# Patient Record
Sex: Female | Born: 1960 | Race: White | Hispanic: No | Marital: Single | Smoking: Never smoker
Health system: Southern US, Community
[De-identification: ages and names within clinical notes are randomized; demographics above are authoritative.]

## PROBLEM LIST (undated history)

## (undated) DIAGNOSIS — K219 Gastro-esophageal reflux disease without esophagitis: Secondary | ICD-10-CM

## (undated) DIAGNOSIS — D649 Anemia, unspecified: Secondary | ICD-10-CM

## (undated) DIAGNOSIS — F419 Anxiety disorder, unspecified: Secondary | ICD-10-CM

## (undated) DIAGNOSIS — T8859XA Other complications of anesthesia, initial encounter: Secondary | ICD-10-CM

## (undated) DIAGNOSIS — N186 End stage renal disease: Secondary | ICD-10-CM

## (undated) DIAGNOSIS — T4145XA Adverse effect of unspecified anesthetic, initial encounter: Secondary | ICD-10-CM

## (undated) DIAGNOSIS — I10 Essential (primary) hypertension: Secondary | ICD-10-CM

## (undated) DIAGNOSIS — N189 Chronic kidney disease, unspecified: Secondary | ICD-10-CM

## (undated) HISTORY — PX: HYSTEROSCOPY W/ ENDOMETRIAL ABLATION: SUR665

## (undated) HISTORY — PX: BACK SURGERY: SHX140

## (undated) HISTORY — PX: EYE SURGERY: SHX253

---

## 1998-09-05 ENCOUNTER — Emergency Department (HOSPITAL_COMMUNITY): Admission: EM | Admit: 1998-09-05 | Discharge: 1998-09-05 | Payer: Self-pay | Admitting: Emergency Medicine

## 1998-09-05 ENCOUNTER — Encounter: Payer: Self-pay | Admitting: Emergency Medicine

## 2001-07-14 ENCOUNTER — Encounter: Admission: RE | Admit: 2001-07-14 | Discharge: 2001-07-14 | Payer: Self-pay | Admitting: Internal Medicine

## 2001-07-14 ENCOUNTER — Encounter: Payer: Self-pay | Admitting: Internal Medicine

## 2003-05-01 ENCOUNTER — Encounter (HOSPITAL_COMMUNITY): Admission: RE | Admit: 2003-05-01 | Discharge: 2003-07-30 | Payer: Self-pay | Admitting: Internal Medicine

## 2003-05-10 ENCOUNTER — Encounter: Admission: RE | Admit: 2003-05-10 | Discharge: 2003-05-10 | Payer: Self-pay | Admitting: Internal Medicine

## 2003-06-07 ENCOUNTER — Other Ambulatory Visit: Admission: RE | Admit: 2003-06-07 | Discharge: 2003-06-07 | Payer: Self-pay | Admitting: Obstetrics and Gynecology

## 2003-06-14 ENCOUNTER — Encounter: Admission: RE | Admit: 2003-06-14 | Discharge: 2003-06-14 | Payer: Self-pay | Admitting: Obstetrics and Gynecology

## 2004-02-22 ENCOUNTER — Encounter: Admission: RE | Admit: 2004-02-22 | Discharge: 2004-02-22 | Payer: Self-pay | Admitting: Internal Medicine

## 2004-07-02 ENCOUNTER — Other Ambulatory Visit: Admission: RE | Admit: 2004-07-02 | Discharge: 2004-07-02 | Payer: Self-pay | Admitting: Obstetrics and Gynecology

## 2005-08-12 ENCOUNTER — Other Ambulatory Visit: Admission: RE | Admit: 2005-08-12 | Discharge: 2005-08-12 | Payer: Self-pay | Admitting: Obstetrics and Gynecology

## 2005-09-18 ENCOUNTER — Ambulatory Visit (HOSPITAL_COMMUNITY): Admission: RE | Admit: 2005-09-18 | Discharge: 2005-09-18 | Payer: Self-pay | Admitting: Obstetrics and Gynecology

## 2007-05-25 ENCOUNTER — Encounter: Admission: RE | Admit: 2007-05-25 | Discharge: 2007-05-25 | Payer: Self-pay | Admitting: Internal Medicine

## 2007-05-31 ENCOUNTER — Other Ambulatory Visit: Admission: RE | Admit: 2007-05-31 | Discharge: 2007-05-31 | Payer: Self-pay | Admitting: Obstetrics and Gynecology

## 2009-12-09 ENCOUNTER — Other Ambulatory Visit: Admission: RE | Admit: 2009-12-09 | Discharge: 2009-12-09 | Payer: Self-pay | Admitting: Obstetrics and Gynecology

## 2010-05-04 ENCOUNTER — Encounter: Payer: Self-pay | Admitting: Obstetrics and Gynecology

## 2010-08-29 NOTE — H&P (Signed)
[name], [name]           ACCOUNT NO.:  [id number]   MEDICAL RECORD NO.:  [id number]          PATIENT TYPE:  AMB   LOCATION:  [facility]C                           FACILITY:  [facility]   PHYSICIAN:  [name], MDDATE OF BIRTH:  04/15/1960   DATE OF ADMISSION:  DATE OF DISCHARGE:                                HISTORY & PHYSICAL   CHIEF COMPLAINT:  Menorrhagia.   HISTORY:  A 50 year old para 0-0-0-0, LMP Sep 06, 2005, with menorrhagia  with periods very heavy, lasting up to three weeks, failing Micronor and  Depo-Provera, moderate to severe dysmenorrhea and anemia chronically with  negative evaluation with PCP. She was suppressed with birth control pills  successfully, but with multiple medical problems and her age, I feel this is  contraindicated for further use, and she is now to be admitted to undergo an  endometrial ablation. She understands that this would preclude further  childbearing capacity, contraindicate further pregnancy consideration. She  gives informed consent of failed procedure technically as well as medically,  uterine perforation risks, bowel and bladder damage, infection and bleeding.  She gives informed consent. All questions are answered.   PAST MEDICAL HISTORY:  1.  Hypertension.  2.  Diabetes.  3.  Chronic leg edema.   PAST SURGICAL HISTORY:  Back surgery for ruptured disk.   MEDICATIONS:  Dose is not specified. Metformin, glyburide, iron supplements,  atenolol, Lasix and Lantus.   ALLERGIES:  STEROID DOSE PACK; eyes - decreased vision she states reaction.  CODEINE makes her feel loopy. Otherwise, no other medication reactions.   SOCIAL HISTORY:  Denies tobacco, ethanol, drug use or STD exposure. She is  not sexually active. She is not married and has never been sexually active.   FAMILY HISTORY:  Mother with past history of ovarian cancer, survivor at  this time. Father with hypertension. Mother with diabetes. Otherwise no  major medical  issues.   REVIEW OF SYSTEMS:  Menorrhagia is only problem. Denies fevers, chills,  rashes, lesions, headaches, dizziness. Some seasonal allergies are present.  No chest pain, shortness of breath, wheezing, diarrhea, constipation,  bleeding, melena, hematochezia, urgency, frequency, dysuria, incontinence,  hematuria, galactorrhea or emotional changes.   PHYSICAL EXAMINATION:  GENERAL:  Alert and oriented x3 in no distress.  VITAL SIGNS:  Blood pressure 138/82, heart rate 64, respirations 18, weight  167 pounds.  HEENT:  Grossly within normal limits.  NECK:  Supple without thyromegaly or adenopathy.  LUNGS:  Clear bilaterally.  HEART:  Regular rate and rhythm without murmurs, rubs, or gallops.  BREASTS:  Symmetrical. Otherwise deferred.  ABDOMEN:  Mild obesity is present. Otherwise no hepatosplenomegaly or other  masses noted. Abdominal exam is nontender.  PELVIC:  Normal external female genitalia. Bartholin urethral scheme within  normal limits. Vulvar without discharge or lesions. Of note, endometrial  biopsy Aug 19, 2005 was normal, and Pap Aug 12, 2005 showed atypical  endometrial cells felt to possibly indicate menses. Bimanual examination:  Single digit limiting exam, uterus not enlarged on limited exam and also  limited by patient's body habitus. Ovaries are not palpable bilaterally  secondary to patient's  body habitus. Adnexa nontender without masses,  somewhat indeterminate secondary to patient's body habitus.  RECTAL:  Not done. External hemorrhoids are not present. Anus and perineal  body appear normal.   ASSESSMENT:  1.  Menorrhea failing conservative management.  2.  Hypertension.  3.  Diabetes.   PLAN:  1.  Endometrial ablation. Informed consent is given as noted above.  2.  CBG on morning of admission. Withhold diabetes medications from [place]      prior to admission. N.p.o. past midnight. Preoperative CBC and CBG on      arrival. Anesthesia preop. EKG per anesthesia.  All questions were      answered and will proceed as outlined.      Katherine A. Sydnee Cabal, MD  Electronically Signed     CAD/MEDQ  D:  09/16/2005  T:  09/16/2005  Job:  [id number]

## 2010-08-29 NOTE — Op Note (Signed)
[name], [name]           ACCOUNT NO.:  [id number]   MEDICAL RECORD NO.:  [id number]          PATIENT TYPE:  AMB   LOCATION:  SDC                           [facility]ILITY:  [facility]   PHYSICIAN:  Charles A. Delcambre, MDDATE OF BIRTH:  12-03-1960   DATE OF PROCEDURE:  09/18/2005  DATE OF DISCHARGE:                                 OPERATIVE REPORT   PREOPERATIVE DIAGNOSIS:  Menorrhagia.   POSTOPERATIVE DIAGNOSIS:  Menorrhagia.   PROCEDURE:  ThermaChoice endometrial ablation.   SURGEON:  Charles A. Delcambre, MD   ASSISTANT:  None.   COMPLICATIONS:  None.   ESTIMATED BLOOD LOSS:  Minimal.   COMPLICATIONS:  None.   OPERATIVE FINDINGS:  Sounds to 9 cm.   ANESTHESIA:  General endotracheal.   SPONGE COUNT:  Correct x2.   DESCRIPTION OF PROCEDURE:  Patient was taken to the operating room, placed  in a supine position.  She was given a general anesthetic without  difficulty.  Anesthesia was adequate.  She was then placed in the dorsal  lithotomy position in general stirrups and sterile prep and drape was  undertaken.  A speculum was placed.  A single-tooth tenaculum was placed on  her cervix.  The sound was to 9 cm.  Protocol was undertaken for  ThermaChoice.  Prep was taken to -160.  Pressurization after insertion was  then carried out to 180.  Pressure stabilized between 160-180.  Heating  cycle was begun.  A full heating cycle at 87 degrees Centigrade for eight  minutes was accomplished without difficulty with stable pressure.  Instrument removed.  Tenaculum was removed.  Hemostasis was adequate.  The  patient tolerated the procedure well, was awakened, extubated, and taken to  the recovery with physician in attendance.      Charles A. Sydnee Cabal, MD  Electronically Signed    CAD/MEDQ  D:  09/18/2005  T:  09/18/2005  Job:  [id number]

## 2011-09-06 ENCOUNTER — Encounter (HOSPITAL_COMMUNITY): Payer: Self-pay | Admitting: *Deleted

## 2011-09-06 ENCOUNTER — Emergency Department (HOSPITAL_COMMUNITY): Payer: Self-pay

## 2011-09-06 ENCOUNTER — Inpatient Hospital Stay (HOSPITAL_COMMUNITY)
Admission: EM | Admit: 2011-09-06 | Discharge: 2011-09-08 | DRG: 392 | Disposition: A | Discharge status: Home / Self Care | Payer: Self-pay | Source: Ambulatory Visit | Attending: Internal Medicine | Admitting: Internal Medicine

## 2011-09-06 DIAGNOSIS — D509 Iron deficiency anemia, unspecified: Secondary | ICD-10-CM | POA: Diagnosis present

## 2011-09-06 DIAGNOSIS — E119 Type 2 diabetes mellitus without complications: Secondary | ICD-10-CM | POA: Diagnosis present

## 2011-09-06 DIAGNOSIS — E871 Hypo-osmolality and hyponatremia: Secondary | ICD-10-CM

## 2011-09-06 DIAGNOSIS — I129 Hypertensive chronic kidney disease with stage 1 through stage 4 chronic kidney disease, or unspecified chronic kidney disease: Secondary | ICD-10-CM | POA: Diagnosis present

## 2011-09-06 DIAGNOSIS — N183 Chronic kidney disease, stage 3 unspecified: Secondary | ICD-10-CM | POA: Diagnosis present

## 2011-09-06 DIAGNOSIS — K2900 Acute gastritis without bleeding: Secondary | ICD-10-CM

## 2011-09-06 DIAGNOSIS — Z91199 Patient's noncompliance with other medical treatment and regimen due to unspecified reason: Secondary | ICD-10-CM

## 2011-09-06 DIAGNOSIS — R109 Unspecified abdominal pain: Secondary | ICD-10-CM

## 2011-09-06 DIAGNOSIS — Z9119 Patient's noncompliance with other medical treatment and regimen: Secondary | ICD-10-CM

## 2011-09-06 DIAGNOSIS — IMO0001 Reserved for inherently not codable concepts without codable children: Secondary | ICD-10-CM | POA: Diagnosis present

## 2011-09-06 DIAGNOSIS — R1013 Epigastric pain: Secondary | ICD-10-CM | POA: Diagnosis present

## 2011-09-06 DIAGNOSIS — N179 Acute kidney failure, unspecified: Secondary | ICD-10-CM | POA: Diagnosis present

## 2011-09-06 DIAGNOSIS — E875 Hyperkalemia: Secondary | ICD-10-CM | POA: Diagnosis present

## 2011-09-06 DIAGNOSIS — K2970 Gastritis, unspecified, without bleeding: Secondary | ICD-10-CM | POA: Diagnosis present

## 2011-09-06 DIAGNOSIS — R739 Hyperglycemia, unspecified: Secondary | ICD-10-CM | POA: Diagnosis present

## 2011-09-06 HISTORY — DX: Anemia, unspecified: D64.9

## 2011-09-06 HISTORY — DX: Essential (primary) hypertension: I10

## 2011-09-06 LAB — CBC
Hemoglobin: 12.1 g/dL (ref 12.0–15.0)
MCH: 26.5 pg (ref 26.0–34.0)
MCV: 77.9 fL — ABNORMAL LOW (ref 78.0–100.0)
RBC: 4.57 MIL/uL (ref 3.87–5.11)

## 2011-09-06 LAB — COMPREHENSIVE METABOLIC PANEL
Alkaline Phosphatase: 179 U/L — ABNORMAL HIGH (ref 39–117)
BUN: 52 mg/dL — ABNORMAL HIGH (ref 6–23)
Calcium: 9.8 mg/dL (ref 8.4–10.5)
Creatinine, Ser: 2.38 mg/dL — ABNORMAL HIGH (ref 0.50–1.10)
GFR calc Af Amer: 26 mL/min — ABNORMAL LOW (ref >90)
Glucose, Bld: 346 mg/dL — ABNORMAL HIGH (ref 70–99)
Total Protein: 7.3 g/dL (ref 6.0–8.3)

## 2011-09-06 LAB — POCT I-STAT TROPONIN I: Troponin i, poc: 0 ng/mL (ref 0.00–0.08)

## 2011-09-06 LAB — DIFFERENTIAL
Eosinophils Absolute: 0.1 10*3/uL (ref 0.0–0.7)
Eosinophils Relative: 1 % (ref 0–5)
Lymphs Abs: 1.7 10*3/uL (ref 0.7–4.0)
Monocytes Absolute: 0.5 10*3/uL (ref 0.1–1.0)
Monocytes Relative: 6 % (ref 3–12)

## 2011-09-06 LAB — BASIC METABOLIC PANEL
BUN: 50 mg/dL — ABNORMAL HIGH (ref 6–23)
Creatinine, Ser: 2.35 mg/dL — ABNORMAL HIGH (ref 0.50–1.10)
GFR calc Af Amer: 26 mL/min — ABNORMAL LOW (ref >90)
GFR calc non Af Amer: 23 mL/min — ABNORMAL LOW (ref >90)
Potassium: 5.6 mEq/L — ABNORMAL HIGH (ref 3.5–5.1)

## 2011-09-06 LAB — GLUCOSE, CAPILLARY
Glucose-Capillary: 344 mg/dL — ABNORMAL HIGH (ref 70–99)
Glucose-Capillary: 397 mg/dL — ABNORMAL HIGH (ref 70–99)

## 2011-09-06 LAB — LIPASE, BLOOD: Lipase: 86 U/L — ABNORMAL HIGH (ref 11–59)

## 2011-09-06 MED ORDER — HYDROMORPHONE HCL PF 1 MG/ML IJ SOLN
1.0000 mg | Freq: Once | INTRAMUSCULAR | Status: AC
  Administered 2011-09-06: 1 mg via INTRAVENOUS
  Filled 2011-09-06: qty 1

## 2011-09-06 MED ORDER — ONDANSETRON HCL 4 MG PO TABS: 4.0000 mg | ORAL_TABLET | ORAL | Status: DC | PRN

## 2011-09-06 MED ORDER — PANTOPRAZOLE SODIUM 40 MG IV SOLR
40.0000 mg | INTRAVENOUS | Status: DC
  Administered 2011-09-06: 40 mg via INTRAVENOUS
  Filled 2011-09-06: qty 40

## 2011-09-06 MED ORDER — INSULIN ASPART 100 UNIT/ML ~~LOC~~ SOLN
0.0000 [IU] | Freq: Every day | SUBCUTANEOUS | Status: DC
  Administered 2011-09-06: 5 [IU] via SUBCUTANEOUS

## 2011-09-06 MED ORDER — ONDANSETRON HCL 4 MG/2ML IJ SOLN
4.0000 mg | Freq: Once | INTRAMUSCULAR | Status: AC
  Administered 2011-09-06: 4 mg via INTRAVENOUS

## 2011-09-06 MED ORDER — INSULIN ASPART 100 UNIT/ML ~~LOC~~ SOLN
0.0000 [IU] | Freq: Three times a day (TID) | SUBCUTANEOUS | Status: DC
  Administered 2011-09-07: 8 [IU] via SUBCUTANEOUS
  Administered 2011-09-07: 3 [IU] via SUBCUTANEOUS
  Administered 2011-09-08: 5 [IU] via SUBCUTANEOUS

## 2011-09-06 MED ORDER — SODIUM CHLORIDE 0.9 % IV SOLN
INTRAVENOUS | Status: DC
  Administered 2011-09-06 – 2011-09-07 (×3): via INTRAVENOUS

## 2011-09-06 MED ORDER — FELODIPINE ER 5 MG PO TB24
5.0000 mg | ORAL_TABLET | Freq: Every day | ORAL | Status: DC
  Administered 2011-09-06 – 2011-09-07 (×2): 5 mg via ORAL
  Filled 2011-09-06 (×3): qty 1

## 2011-09-06 MED ORDER — SODIUM CHLORIDE 0.9 % IV SOLN: INTRAVENOUS | Status: AC

## 2011-09-06 MED ORDER — ONDANSETRON HCL 4 MG/2ML IJ SOLN
4.0000 mg | Freq: Once | INTRAMUSCULAR | Status: AC
  Administered 2011-09-06: 4 mg via INTRAVENOUS
  Filled 2011-09-06: qty 2

## 2011-09-06 MED ORDER — HYDROMORPHONE HCL PF 1 MG/ML IJ SOLN: 1.0000 mg | INTRAMUSCULAR | Status: AC | PRN

## 2011-09-06 MED ORDER — HEPARIN SODIUM (PORCINE) 5000 UNIT/ML IJ SOLN
5000.0000 [IU] | Freq: Three times a day (TID) | INTRAMUSCULAR | Status: DC
  Administered 2011-09-06 – 2011-09-08 (×5): 5000 [IU] via SUBCUTANEOUS
  Filled 2011-09-06 (×8): qty 1

## 2011-09-06 MED ORDER — ONDANSETRON HCL 4 MG/2ML IJ SOLN: 4.0000 mg | Freq: Three times a day (TID) | INTRAMUSCULAR | Status: DC | PRN

## 2011-09-06 MED ORDER — SODIUM CHLORIDE 0.9 % IV SOLN
Freq: Once | INTRAVENOUS | Status: AC
  Administered 2011-09-06: 09:00:00 via INTRAVENOUS

## 2011-09-06 MED ORDER — ATENOLOL 50 MG PO TABS
50.0000 mg | ORAL_TABLET | Freq: Two times a day (BID) | ORAL | Status: DC
  Administered 2011-09-06 – 2011-09-08 (×4): 50 mg via ORAL
  Filled 2011-09-06 (×5): qty 1

## 2011-09-06 MED ORDER — ONDANSETRON HCL 4 MG/2ML IJ SOLN
INTRAMUSCULAR | Status: AC
  Filled 2011-09-06: qty 2

## 2011-09-06 MED ORDER — INSULIN ASPART 100 UNIT/ML ~~LOC~~ SOLN: 0.0000 [IU] | Freq: Three times a day (TID) | SUBCUTANEOUS | Status: DC

## 2011-09-06 MED ORDER — INSULIN ASPART 100 UNIT/ML ~~LOC~~ SOLN: 0.0000 [IU] | Freq: Every day | SUBCUTANEOUS | Status: DC

## 2011-09-06 MED ORDER — ONDANSETRON HCL 4 MG/2ML IJ SOLN
4.0000 mg | Freq: Four times a day (QID) | INTRAMUSCULAR | Status: DC | PRN
  Administered 2011-09-06: 4 mg via INTRAVENOUS
  Filled 2011-09-06: qty 2

## 2011-09-06 MED ORDER — ALUM & MAG HYDROXIDE-SIMETH 200-200-20 MG/5ML PO SUSP: 30.0000 mL | ORAL | Status: DC | PRN

## 2011-09-06 MED ORDER — GLUCAGON HCL (RDNA) 1 MG IJ SOLR
1.0000 mg | Freq: Once | INTRAMUSCULAR | Status: AC
  Administered 2011-09-06: 1 mg via INTRAVENOUS
  Filled 2011-09-06: qty 1

## 2011-09-06 MED ORDER — PANTOPRAZOLE SODIUM 40 MG IV SOLR
40.0000 mg | Freq: Two times a day (BID) | INTRAVENOUS | Status: DC
  Administered 2011-09-06 – 2011-09-07 (×3): 40 mg via INTRAVENOUS
  Filled 2011-09-06 (×5): qty 40

## 2011-09-06 NOTE — ED Notes (Signed)
Pt actively vomiting at the time. Also states epigastric pain has returned after ultrasound. Rating 6/10 at the time. Medicated, will continue to monitor.

## 2011-09-06 NOTE — ED Notes (Signed)
Pt reports having epigastric/chest pain that started yesterday after eating at a cookout, radiates into her back. ekg done at triage, no acute distress noted.

## 2011-09-06 NOTE — ED Notes (Signed)
Pt returned to exam room from ultrasound.  

## 2011-09-06 NOTE — ED Notes (Signed)
Pt transported to ultrasound.

## 2011-09-06 NOTE — H&P (Signed)
PCP:  Pearla Dubonnet, MD  DOA:  09/06/2011  7:59 AM  Chief Complaint:  Epigastric pain with nausea since 1 day  HPI: 51 year old female with history off hypertension and diabetes [on oral hypoglycemic agent], ? Chronic kidney disease, iron deficiency anemia presented to the ED with severe epigastric pain associated with nausea since 1 day. Patient informs going to cookout yesterday her she had some pork and after returning home started having severe epigastric pain which was sharp and stabbing in nature associated with bloating and severe nausea. She took some Mylanta home without much relief. She felt as if a lump was stuck in her upper abdomen and tried to vomited out but could not. This morning the symptoms got severe with pain radiating into the shoulder blades. She also felt bloated and  was experiencing some difficulty catching breath with it and came to ED. Patient had a lab work done in the ED which showed mild hyponatremia and hyperkalemia along with underlying renal disease and elevated blood glucose level. She had a barium swallow which was unremarkable for any obstruction and an ultrasound of the abdomen showed no signs of gallbladder obstruction. She had an episode of bilious vomiting after returning from the ultrasound. On my evaluation she still having off and on nausea.  She denies any chest pain or palpitation. Denies history of GERD. Denies any fever, urinary symptoms or diarrhea. Denies any hematemesis. Informs that she has general upset with me products and usually avoids it.   Allergies: Allergies  Allergen Reactions  . Codeine Other (See Comments)    hallucinations    Prior to Admission medications   Medication Sig Start Date End Date Taking? Authorizing Provider  aluminum & magnesium hydroxide (MAALOX) 225-200 MG/5ML suspension Take 30 mLs by mouth every 6 (six) hours as needed. For bloating   Yes Historical Provider, MD  atenolol (TENORMIN) 50 MG tablet Take 50 mg  by mouth 2 (two) times daily.   Yes Historical Provider, MD  famotidine (PEPCID AC) 10 MG chewable tablet Chew 10-20 mg by mouth 2 (two) times daily as needed. For nausea or queasiness   Yes Historical Provider, MD  felodipine (PLENDIL) 5 MG 24 hr tablet Take 5 mg by mouth at bedtime.   Yes Historical Provider, MD  ferrous gluconate (FERGON) 325 MG tablet Take 325-650 mg by mouth 2 (two) times daily. 2 tablets in the morning and 1 tablet at night   Yes Historical Provider, MD  glyBURIDE (DIABETA) 5 MG tablet Take 10 mg by mouth 2 (two) times daily with a meal.   Yes Historical Provider, MD  ibuprofen (ADVIL,MOTRIN) 200 MG tablet Take 200-800 mg by mouth every 6 (six) hours as needed. For pain   Yes Historical Provider, MD  insulin glargine (LANTUS) 100 UNIT/ML injection Inject 10-15 Units into the skin at bedtime as needed. If blood sugars up to 170-180   Yes Historical Provider, MD  metFORMIN (GLUCOPHAGE) 500 MG tablet Take 1,000 mg by mouth 2 (two) times daily with a meal.   Yes Historical Provider, MD  neomycin-bacitracin-polymyxin (NEOSPORIN) OINT Apply 1 application topically as needed. For yellow jacket sting   Yes Historical Provider, MD    Past Medical History  Diagnosis Date  . Hypertension   . Diabetes mellitus     History reviewed. No pertinent past surgical history.  Social History:  reports that she has never smoked. She does not have any smokeless tobacco history on file. She reports that she does not drink  alcohol or use illicit drugs.  History reviewed. No pertinent family history.  Review of Systems:  Constitutional: Denies fever, chills, diaphoresis, appetite change and fatigue.  HEENT: Denies photophobia, eye pain, redness, hearing loss, ear pain, congestion, sore throat, rhinorrhea, sneezing, mouth sores, trouble swallowing, neck pain, neck stiffness and tinnitus.   Respiratory: Denies SOB, DOE, cough, chest tightness,  and wheezing.   Cardiovascular: Denies chest  pain, palpitations and leg swelling.  Gastrointestinal: nausea, vomiting, abdominal pain and  distention. , Denies diarrhea, constipation, blood in stool.  Genitourinary: Denies dysuria, urgency, frequency, hematuria, flank pain and difficulty urinating.  Musculoskeletal: Denies myalgias, back pain, joint swelling, arthralgias and gait problem.  Skin: Denies pallor, rash and wound.  Neurological: Denies dizziness, seizures, syncope, weakness, light-headedness, numbness and headaches.  Hematological: Denies adenopathy. Easy bruising, personal or family bleeding history  Psychiatric/Behavioral: Denies suicidal ideation, mood changes, confusion, nervousness, sleep disturbance and agitation   Physical Exam:  Filed Vitals:   09/06/11 1215 09/06/11 1410 09/06/11 1547 09/06/11 1712  BP: 189/93 180/80 160/87 164/58  Pulse: 70 64 67 63  Temp:  98.8 F (37.1 C) 98.6 F (37 C) 98.7 F (37.1 C)  TempSrc:  Oral Oral Oral  Resp: 20 18 18 18   SpO2: 97% 99% 99% 100%    Constitutional: Vital signs reviewed.  Patient is a well-developed and well-nourished in no acute distress and cooperative with exam. Alert and oriented x3.  Head: Normocephalic and atraumatic Ear: TM normal bilaterally Mouth: no erythema or exudates, MMM Eyes: PERRL, EOMI, conjunctivae normal, No scleral icterus.  Neck: Supple, Trachea midline normal ROM, No JVD, mass, thyromegaly, or carotid bruit present.  Cardiovascular: RRR, S1 normal, S2 normal, no MRG, pulses symmetric and intact bilaterally Pulmonary/Chest: CTAB, no wheezes, rales, or rhonchi Abdominal: Soft. BS+ . Tender on deep palpation over the epigastric area. Unable to elicit murphy's sign.  non-distended, bowel sounds are normal, no masses, organomegaly, or guarding present.  GU: no CVA tenderness Musculoskeletal: No joint deformities, erythema, or stiffness, ROM full and no nontender Ext: no edema and no cyanosis, pulses palpable bilaterally (DP and PT) Hematology:  no cervical, inginal, or axillary adenopathy.  Neurological: A&O x3, Strenght is normal and symmetric bilaterally, cranial nerve II-XII are grossly intact, no focal motor deficit, sensory intact to light touch bilaterally.  Skin: Warm, dry and intact. No rash, cyanosis, or clubbing.  Psychiatric: Normal mood and affect. speech and behavior is normal. Judgment and thought content normal. Cognition and memory are normal.   Labs on Admission:  Results for orders placed during the hospital encounter of 09/06/11 (from the past 48 hour(s))  CBC     Status: Abnormal   Collection Time   09/06/11  8:49 AM      Component Value Range Comment   WBC 8.9  4.0 - 10.5 (K/uL)    RBC 4.57  3.87 - 5.11 (MIL/uL)    Hemoglobin 12.1  12.0 - 15.0 (g/dL)    HCT 16.1 (*) 09.6 - 46.0 (%)    MCV 77.9 (*) 78.0 - 100.0 (fL)    MCH 26.5  26.0 - 34.0 (pg)    MCHC 34.0  30.0 - 36.0 (g/dL)    RDW 04.5  40.9 - 81.1 (%)    Platelets 271  150 - 400 (K/uL)   DIFFERENTIAL     Status: Normal   Collection Time   09/06/11  8:49 AM      Component Value Range Comment   Neutrophils Relative 74  43 -  77 (%)    Neutro Abs 6.5  1.7 - 7.7 (K/uL)    Lymphocytes Relative 19  12 - 46 (%)    Lymphs Abs 1.7  0.7 - 4.0 (K/uL)    Monocytes Relative 6  3 - 12 (%)    Monocytes Absolute 0.5  0.1 - 1.0 (K/uL)    Eosinophils Relative 1  0 - 5 (%)    Eosinophils Absolute 0.1  0.0 - 0.7 (K/uL)    Basophils Relative 0  0 - 1 (%)    Basophils Absolute 0.0  0.0 - 0.1 (K/uL)   COMPREHENSIVE METABOLIC PANEL     Status: Abnormal   Collection Time   09/06/11  8:49 AM      Component Value Range Comment   Sodium 129 (*) 135 - 145 (mEq/L)    Potassium 5.3 (*) 3.5 - 5.1 (mEq/L)    Chloride 97  96 - 112 (mEq/L)    CO2 19  19 - 32 (mEq/L)    Glucose, Bld 346 (*) 70 - 99 (mg/dL)    BUN 52 (*) 6 - 23 (mg/dL)    Creatinine, Ser 1.61 (*) 0.50 - 1.10 (mg/dL)    Calcium 9.8  8.4 - 10.5 (mg/dL)    Total Protein 7.3  6.0 - 8.3 (g/dL)    Albumin 3.3 (*)  3.5 - 5.2 (g/dL)    AST 12  0 - 37 (U/L)    ALT 10  0 - 35 (U/L)    Alkaline Phosphatase 179 (*) 39 - 117 (U/L)    Total Bilirubin 0.2 (*) 0.3 - 1.2 (mg/dL)    GFR calc non Af Amer 22 (*) >90 (mL/min)    GFR calc Af Amer 26 (*) >90 (mL/min)   LIPASE, BLOOD     Status: Abnormal   Collection Time   09/06/11  8:49 AM      Component Value Range Comment   Lipase 86 (*) 11 - 59 (U/L)   POCT I-STAT TROPONIN I     Status: Normal   Collection Time   09/06/11  9:04 AM      Component Value Range Comment   Troponin i, poc 0.00  0.00 - 0.08 (ng/mL)    Comment 3              Radiological Exams on Admission: Gallbladder: Gallbladder wall is 2.6 mm, within normal limits. No  stones or pericholecystic fluid. No sonographic Murphy's sign. A  probable small polyp is identified within the gallbladder,  measuring 4 mm in diameter. No stones are present.  Common bile duct: 4.3 mm.  Liver: No focal lesion identified. Within normal limits in  parenchymal echogenicity.  IVC: Appears normal.  Pancreas: The pancreatic tail is not well seen because of bowel  gas. Visualized portion of the pancreas has a normal appearance.  Spleen: The spleen is normal appearance, 4.7 cm.  Right Kidney: Echogenic parenchyma, 8.9 cm in appearance. No  focal mass or hydronephrosis.  Left Kidney: Echogenic parenchyma, 9.8 cm in appearance. No focal  mass or hydronephrosis.  Abdominal aorta: No aneurysm, 2.0 cm.  IMPRESSION:  1. Probable small gallbladder polyp.  2. No evidence for acute cholecystitis.  3. Echogenic renal parenchyma bilaterally, suggesting intrinsic  renal disease. No hydronephrosis identified.   EKG: NSR@76  isolated TWI in avL , no ST-T changes  Assessment/Plan   *Acute Gastritis   admit to medical floor on observation We'll start on IV fluids with normal saline IV Protonix 40 mg twice  a day When necessary Maalox, when necessary Zofran for nausea Patient does take Motrin for pain as needed which  will be held. Denies Taking any extra pills  i will start her on clear liquids Recheck lytes    Hyponatremia Related to vomiting and some dehydration Continue with IV hydration and recheck   Acute kidney injury She informs that she does have some underlying kidney disease although does not remember what last creatinine was. Check a UA She could have some underlying prerenal acute kidney injury Ultrasound abdomen suggestive of intrinsic renal disease Continue fluids and Recheck labs   Diabetes mellitus Patient noted to have elevated blood glucose in the ED Hold Oral hypoglycemics Check FSG and sliding scale insulin informs Having her A1c checked recently  Hypertension Continue home medications   Hyperkalemia recheck potassium level. Hold off From giving Kayexalate at this time  Diet Clear liquids  DVT prophylaxis SQ Heparin  CODE STATUS full code  Patient will be  taken over by [facility] service in the morning  Time Spent on Admission: 45 minutes  Tessy Pawelski 09/06/2011, 5:20 PM

## 2011-09-06 NOTE — ED Notes (Signed)
Pt presents to department for evaluation of epigastric/chest pain. States she ate several hot dogs yesterday afternoon. Pain started after. States she feels like "something is stuck." points to upper epigastric area and middle of chest when asked about pain, rating 5/10 at the time. Pain radiates between both shoulder blades. Also states it's difficult for her to take a deep breath. Respirations labored. 100% on RA. Skin warm and dry. She is conscious alert and oriented x4. Denies previous cardiac history. Also states nausea.

## 2011-09-06 NOTE — ED Notes (Signed)
Pt updated on plan of care. Denies pain. Vital signs stable. To have abdominal ultrasound. Pt resting quietly at the time.

## 2011-09-06 NOTE — ED Notes (Addendum)
Pt placed on monitor, cont. Pulse ox. Family at bedside.

## 2011-09-06 NOTE — ED Provider Notes (Cosign Needed)
History     CSN: [id number]  Arrival date & time 09/06/11  7829   First MD Initiated Contact with Patient 09/06/11 0825      Chief Complaint  Patient presents with  . Chest Pain    (Consider location/radiation/quality/duration/timing/severity/associated sxs/prior treatment) HPI Comments: And 51 year old woman who went to a cookout yesterday T8 to hot after she fell today and was at the food gets stuck in her upper abdomen. The food didn't seem to go down. She has been able to take liquids, but can't eat food. She feels a feeling in her upper back like when she had pneumonia in the past. She is not coughing. She tried to vomit, but nothing really came up. He therefore sought evaluation. There is no prior history of similar episode. Past history is noteworthy in that she has diabetes and hypertension.  Patient is a 51 y.o. female presenting with GI illlness.  GI Problem  This is a new problem. The current episode started 12 to 24 hours ago. Episode frequency: There is a constant feeling of something stuck in her lower chest upper abdomen. The problem has not changed since onset.Diarrhea characteristics: No diarrhea. There has been no fever. Pertinent negatives include no chills. She has tried nothing for the symptoms.    Past Medical History  Diagnosis Date  . Hypertension   . Diabetes mellitus     History reviewed. No pertinent past surgical history.  History reviewed. No pertinent family history.  History  Substance Use Topics  . Smoking status: Never Smoker   . Smokeless tobacco: Not on file  . Alcohol Use: No    OB History    Grav Para Term Preterm Abortions TAB SAB Ect Mult Living                  Review of Systems  Constitutional: Negative.  Negative for fever and chills.  HENT: Negative.   Eyes: Negative.   Respiratory: Negative.   Cardiovascular: Negative.   Gastrointestinal:       Feels food is stuck in her lower chest/upper abdomen.  Musculoskeletal:  Negative.   Skin: Negative.   Neurological: Negative.   Psychiatric/Behavioral: Negative.     Allergies  Review of patient's allergies indicates no known allergies.  Home Medications  No current outpatient prescriptions on file.  BP 220/80  Pulse 74  Temp(Src) 98.4 F (36.9 C) (Oral)  Resp 20  SpO2 100%  Physical Exam  Constitutional: She is oriented to person, place, and time. She appears well-developed and well-nourished. Distressed: moderate distress, with foreign body sensation in her lower chest/upper abdomen.  HENT:  Head: Normocephalic and atraumatic.  Right Ear: External ear normal.  Left Ear: External ear normal.  Mouth/Throat: Oropharynx is clear and moist.  Eyes: Conjunctivae are normal. Pupils are equal, round, and reactive to light.  Neck: Normal range of motion. Neck supple.  Cardiovascular: Normal rate, regular rhythm and normal heart sounds.   Pulmonary/Chest: Effort normal and breath sounds normal.  Abdominal: Soft. Bowel sounds are normal.  Musculoskeletal: Normal range of motion.  Neurological: She is alert and oriented to person, place, and time.       No sensory or motor deficit.  Skin: Skin is warm and dry.  Psychiatric: She has a normal mood and affect. Her behavior is normal.    ED Course  Procedures (including critical care time)  8:26 AM  Date: 09/06/2011  Rate: 76  Rhythm: normal sinus rhythm  QRS Axis: left  Intervals:  normal  ST/T Wave abnormalities: normal  Conduction Disutrbances:none  Narrative Interpretation: Normal EKG  Old EKG Reviewed: unchanged  8:55 AM Patient was seen and had physical exam. Laboratory testing was ordered. IV glucagon was ordered. EKG was benign.  9:08 AM Pt is now vomiting.  Rx IV Zofran 4 mg.  10:33 AM Results for orders placed during the hospital encounter of 09/06/11  CBC      Component Value Range   WBC 8.9  4.0 - 10.5 (K/uL)   RBC 4.57  3.87 - 5.11 (MIL/uL)   Hemoglobin 12.1  12.0 - 15.0  (g/dL)   HCT 46.9 (*) 62.9 - 46.0 (%)   MCV 77.9 (*) 78.0 - 100.0 (fL)   MCH 26.5  26.0 - 34.0 (pg)   MCHC 34.0  30.0 - 36.0 (g/dL)   RDW 52.8  41.3 - 24.4 (%)   Platelets 271  150 - 400 (K/uL)  DIFFERENTIAL      Component Value Range   Neutrophils Relative 74  43 - 77 (%)   Neutro Abs 6.5  1.7 - 7.7 (K/uL)   Lymphocytes Relative 19  12 - 46 (%)   Lymphs Abs 1.7  0.7 - 4.0 (K/uL)   Monocytes Relative 6  3 - 12 (%)   Monocytes Absolute 0.5  0.1 - 1.0 (K/uL)   Eosinophils Relative 1  0 - 5 (%)   Eosinophils Absolute 0.1  0.0 - 0.7 (K/uL)   Basophils Relative 0  0 - 1 (%)   Basophils Absolute 0.0  0.0 - 0.1 (K/uL)  COMPREHENSIVE METABOLIC PANEL      Component Value Range   Sodium 129 (*) 135 - 145 (mEq/L)   Potassium 5.3 (*) 3.5 - 5.1 (mEq/L)   Chloride 97  96 - 112 (mEq/L)   CO2 19  19 - 32 (mEq/L)   Glucose, Bld 346 (*) 70 - 99 (mg/dL)   BUN 52 (*) 6 - 23 (mg/dL)   Creatinine, Ser 0.10 (*) 0.50 - 1.10 (mg/dL)   Calcium 9.8  8.4 - 27.2 (mg/dL)   Total Protein 7.3  6.0 - 8.3 (g/dL)   Albumin 3.3 (*) 3.5 - 5.2 (g/dL)   AST 12  0 - 37 (U/L)   ALT 10  0 - 35 (U/L)   Alkaline Phosphatase 179 (*) 39 - 117 (U/L)   Total Bilirubin 0.2 (*) 0.3 - 1.2 (mg/dL)   GFR calc non Af Amer 22 (*) >90 (mL/min)   GFR calc Af Amer 26 (*) >90 (mL/min)  LIPASE, BLOOD      Component Value Range   Lipase 86 (*) 11 - 59 (U/L)  POCT I-STAT TROPONIN I      Component Value Range   Troponin i, poc 0.00  0.00 - 0.08 (ng/mL)   Comment 3            No results found.  Lab tests showed elevated blood glucose, renal insufficiency.  No relief with glucagon. Will request barium swallow.  Rx of her pain with IV Dilaudid.    1:41 PM Barium swallow was negative.  Will get abdominal ultrasound to check gall bladder. Her pain is relieved at present.  3:55 PM Abdominal ultrasound showed no gallstones.  She continues with abdominal pain and is vomiting up yellow bile.  Will ask [facility] to admit her  for Dr. Johnella Moloney.  Case discussed with Dr. Gonzella Lex.  Admit to an observation bed to  [facility] 4 to a med surg bed  1. Abdominal  pain, other specified site   2. Nausea and vomiting            Carleene Cooper III, MD 09/06/11 1630

## 2011-09-06 NOTE — ED Notes (Signed)
Rylynne Schicker [phone] (home) [phone] (cell)

## 2011-09-07 LAB — URINE MICROSCOPIC-ADD ON

## 2011-09-07 LAB — URINALYSIS, ROUTINE W REFLEX MICROSCOPIC
Bilirubin Urine: NEGATIVE
Glucose, UA: >1000 mg/dL — AB
Protein, ur: >300 mg/dL — AB

## 2011-09-07 LAB — COMPREHENSIVE METABOLIC PANEL
ALT: 8 U/L (ref 0–35)
AST: 11 U/L (ref 0–37)
CO2: 20 mEq/L (ref 19–32)
Chloride: 103 mEq/L (ref 96–112)
Creatinine, Ser: 2.4 mg/dL — ABNORMAL HIGH (ref 0.50–1.10)
GFR calc Af Amer: 26 mL/min — ABNORMAL LOW (ref >90)
GFR calc non Af Amer: 22 mL/min — ABNORMAL LOW (ref >90)
Glucose, Bld: 99 mg/dL (ref 70–99)
Total Bilirubin: 0.2 mg/dL — ABNORMAL LOW (ref 0.3–1.2)

## 2011-09-07 MED ORDER — ACETAMINOPHEN 325 MG PO TABS
650.0000 mg | ORAL_TABLET | Freq: Four times a day (QID) | ORAL | Status: DC | PRN
  Administered 2011-09-07 – 2011-09-08 (×3): 650 mg via ORAL
  Filled 2011-09-07 (×3): qty 2

## 2011-09-07 MED ORDER — INSULIN ASPART 100 UNIT/ML ~~LOC~~ SOLN
4.0000 [IU] | Freq: Once | SUBCUTANEOUS | Status: AC
  Administered 2011-09-07: 4 [IU] via SUBCUTANEOUS

## 2011-09-07 MED ORDER — TRAMADOL HCL 50 MG PO TABS
50.0000 mg | ORAL_TABLET | Freq: Once | ORAL | Status: AC
  Administered 2011-09-07: 50 mg via ORAL
  Filled 2011-09-07: qty 1

## 2011-09-07 NOTE — Progress Notes (Signed)
Subjective: Admission H&P reviewed, patient presented with nausea, in the ED found to have renal insufficiency uncontrolled diabetes. She was admitted with IV fluids and oral hypoglycemics were held, sliding scale insulin has been started . Patient does not recall any events of nausea vomiting and abdominal pain on a regular basis, denies any history of gastroparesis. She has been taking some NSAIDs. She does endorse being noncompliant with her medication and also suffered from some depression. In regards to the episode from yesterday She just felt like food is stuck in her throat.  Objective: Vital signs in last 24 hours: Temp:  [97.5 F (36.4 C)-98.8 F (37.1 C)] 97.9 F (36.6 C) (05/27 0525) Pulse Rate:  [60-73] 60  (05/27 1009) Resp:  [16-20] 16  (05/27 0525) BP: (136-189)/(58-93) 146/71 mmHg (05/27 1009) SpO2:  [96 %-100 %] 98 % (05/27 0525) Weight:  [86.4 kg (190 lb 7.6 oz)] 86.4 kg (190 lb 7.6 oz) (05/26 1755) Weight change:  Last BM Date: 09/06/11  Intake/Output from previous day: 05/26 0701 - 05/27 0700 In: 1383.3 [P.O.:400; I.V.:983.3] Out: 200 [Urine:200] Intake/Output this shift: Total I/O In: 480 [P.O.:480] Out: 800 [Urine:800]  General appearance: alert and cooperative Resp: clear to auscultation bilaterally GI: soft, non-tender; bowel sounds normal; no masses,  no organomegaly Extremities: extremities normal, atraumatic, no cyanosis or edema  Lab Results:  Results for orders placed during the hospital encounter of 09/06/11 (from the past 24 hour(s))  GLUCOSE, CAPILLARY     Status: Abnormal   Collection Time   09/06/11  5:21 PM      Component Value Range   Glucose-Capillary 344 (*) 70 - 99 (mg/dL)  TROPONIN I     Status: Normal   Collection Time   09/06/11  5:54 PM      Component Value Range   Troponin I <0.30  <0.30 (ng/mL)  BASIC METABOLIC PANEL     Status: Abnormal   Collection Time   09/06/11  5:54 PM      Component Value Range   Sodium 132 (*) 135 - 145  (mEq/L)   Potassium 5.6 (*) 3.5 - 5.1 (mEq/L)   Chloride 101  96 - 112 (mEq/L)   CO2 18 (*) 19 - 32 (mEq/L)   Glucose, Bld 382 (*) 70 - 99 (mg/dL)   BUN 50 (*) 6 - 23 (mg/dL)   Creatinine, Ser 1.61 (*) 0.50 - 1.10 (mg/dL)   Calcium 9.4  8.4 - 09.6 (mg/dL)   GFR calc non Af Amer 23 (*) >90 (mL/min)   GFR calc Af Amer 26 (*) >90 (mL/min)  GLUCOSE, CAPILLARY     Status: Abnormal   Collection Time   09/06/11  8:08 PM      Component Value Range   Glucose-Capillary 397 (*) 70 - 99 (mg/dL)   Comment 1 Notify RN    GLUCOSE, CAPILLARY     Status: Abnormal   Collection Time   09/06/11 11:29 PM      Component Value Range   Glucose-Capillary 300 (*) 70 - 99 (mg/dL)   Comment 1 Notify RN    URINALYSIS, ROUTINE W REFLEX MICROSCOPIC     Status: Abnormal   Collection Time   09/07/11  5:23 AM      Component Value Range   Color, Urine YELLOW  YELLOW    APPearance CLEAR  CLEAR    Specific Gravity, Urine 1.020  1.005 - 1.030    pH 5.5  5.0 - 8.0    Glucose, UA >1000 (*)  NEGATIVE (mg/dL)   Hgb urine dipstick SMALL (*) NEGATIVE    Bilirubin Urine NEGATIVE  NEGATIVE    Ketones, ur 15 (*) NEGATIVE (mg/dL)   Protein, ur >161 (*) NEGATIVE (mg/dL)   Urobilinogen, UA 0.2  0.0 - 1.0 (mg/dL)   Nitrite NEGATIVE  NEGATIVE    Leukocytes, UA NEGATIVE  NEGATIVE   URINE MICROSCOPIC-ADD ON     Status: Normal   Collection Time   09/07/11  5:23 AM      Component Value Range   Squamous Epithelial / LPF RARE  RARE    WBC, UA 0-2  <3 (WBC/hpf)   RBC / HPF 3-6  <3 (RBC/hpf)   Bacteria, UA RARE  RARE    Urine-Other RARE YEAST    COMPREHENSIVE METABOLIC PANEL     Status: Abnormal   Collection Time   09/07/11  5:37 AM      Component Value Range   Sodium 133 (*) 135 - 145 (mEq/L)   Potassium 4.9  3.5 - 5.1 (mEq/L)   Chloride 103  96 - 112 (mEq/L)   CO2 20  19 - 32 (mEq/L)   Glucose, Bld 99  70 - 99 (mg/dL)   BUN 45 (*) 6 - 23 (mg/dL)   Creatinine, Ser 0.96 (*) 0.50 - 1.10 (mg/dL)   Calcium 9.1  8.4 - 04.5  (mg/dL)   Total Protein 6.3  6.0 - 8.3 (g/dL)   Albumin 2.9 (*) 3.5 - 5.2 (g/dL)   AST 11  0 - 37 (U/L)   ALT 8  0 - 35 (U/L)   Alkaline Phosphatase 113  39 - 117 (U/L)   Total Bilirubin 0.2 (*) 0.3 - 1.2 (mg/dL)   GFR calc non Af Amer 22 (*) >90 (mL/min)   GFR calc Af Amer 26 (*) >90 (mL/min)  GLUCOSE, CAPILLARY     Status: Normal   Collection Time   09/07/11  7:48 AM      Component Value Range   Glucose-Capillary 98  70 - 99 (mg/dL)      Studies/Results: US Abdomen Complete  09/06/2011  *RADIOLOGY REPORT*  Clinical Data:  Chest pain.  Epigastric pain.  History of hypertension and diabetes.  COMPLETE ABDOMINAL ULTRASOUND  Comparison:  05/25/2007  Findings:  Gallbladder:  Gallbladder wall is 2.6 mm, within normal limits.  No stones or pericholecystic fluid.  No sonographic Murphy's sign. A probable small polyp is identified within the gallbladder, measuring 4 mm in diameter.  No stones are present.  Common bile duct:  4.3 mm.  Liver:  No focal lesion identified.  Within normal limits in parenchymal echogenicity.  IVC:  Appears normal.  Pancreas:  The pancreatic tail is not well seen because of bowel gas.  Visualized portion of the pancreas has a normal appearance.  Spleen:  The spleen is normal appearance, 4.7 cm.  Right Kidney:  Echogenic parenchyma, 8.9 cm in appearance.  No focal mass or hydronephrosis.  Left Kidney:  Echogenic parenchyma, 9.8 cm in appearance.  No focal mass or hydronephrosis.  Abdominal aorta:  No aneurysm, 2.0 cm.  IMPRESSION:  1.  Probable small gallbladder polyp. 2.  No evidence for acute cholecystitis. 3.  Echogenic renal parenchyma bilaterally, suggesting intrinsic renal disease.  No hydronephrosis identified.  Original Report Authenticated By: Patterson Hammersmith, M.D.   Dg Esophagus W/water Sol Cm  09/06/2011  *RADIOLOGY REPORT*  Clinical Data: Throbbing chest pain after eating, no relief with glucagon  ESOPHOGRAM/BARIUM SWALLOW  Technique:  Single contrast  examination  was performed using water soluble contrast.  Fluoroscopy time:  26 seconds.  Comparison:  None.  Findings:  Normal oral phase of swallowing.  No laryngeal penetration or tracheobronchial aspiration.  Normal esophageal peristalsis.  No fixed esophageal narrowing or stricture.  No obstructing mass/debris was present in the esophagus.  Contrast passed freely into the stomach without delay.  IMPRESSION: Normal esophagram.  Original Report Authenticated By: Charline Bills, M.D.    Medications:  Prior to Admission:  Prescriptions prior to admission  Medication Sig Dispense Refill  . aluminum & magnesium hydroxide (MAALOX) 225-200 MG/5ML suspension Take 30 mLs by mouth every 6 (six) hours as needed. For bloating      . atenolol (TENORMIN) 50 MG tablet Take 50 mg by mouth 2 (two) times daily.      . famotidine (PEPCID AC) 10 MG chewable tablet Chew 10-20 mg by mouth 2 (two) times daily as needed. For nausea or queasiness      . felodipine (PLENDIL) 5 MG 24 hr tablet Take 5 mg by mouth at bedtime.      . ferrous gluconate (FERGON) 325 MG tablet Take 325-650 mg by mouth 2 (two) times daily. 2 tablets in the morning and 1 tablet at night      . glyBURIDE (DIABETA) 5 MG tablet Take 10 mg by mouth 2 (two) times daily with a meal.      . ibuprofen (ADVIL,MOTRIN) 200 MG tablet Take 200-800 mg by mouth every 6 (six) hours as needed. For pain      . insulin glargine (LANTUS) 100 UNIT/ML injection Inject 10-15 Units into the skin at bedtime as needed. If blood sugars up to 170-180      . metFORMIN (GLUCOPHAGE) 500 MG tablet Take 1,000 mg by mouth 2 (two) times daily with a meal.      . neomycin-bacitracin-polymyxin (NEOSPORIN) OINT Apply 1 application topically as needed. For yellow jacket sting       Scheduled:   . sodium chloride   Intravenous STAT  . atenolol  50 mg Oral BID  . felodipine  5 mg Oral QHS  . heparin  5,000 Units Subcutaneous Q8H  .  HYDROmorphone (DILAUDID) injection  1 mg  Intravenous Once  . insulin aspart  0-15 Units Subcutaneous TID WC  . insulin aspart  0-5 Units Subcutaneous QHS  . insulin aspart  4 Units Subcutaneous Once  . ondansetron (ZOFRAN) IV  4 mg Intravenous Once  . pantoprazole (PROTONIX) IV  40 mg Intravenous Q12H  . traMADol  50 mg Oral Once  . DISCONTD: insulin aspart  0-15 Units Subcutaneous TID WC  . DISCONTD: insulin aspart  0-5 Units Subcutaneous QHS  . DISCONTD: pantoprazole (PROTONIX) IV  40 mg Intravenous Q24H   Continuous:   . sodium chloride 100 mL/hr at 09/07/11 0609    Assessment/Plan: Abdominal pain nausea. Ultrasound negative for acute cholecystitis. Patient without any problems now, esophagram normal. Patient tolerated clear liquid diet, regular diet will be provided, further evaluation pending response. Diabetes poor control, check A1c, apparently patient has not been taking her Lantus at home Kidney disease, probable chronic kidney disease we will check records for baseline creatinine, at this time hold metformin, whole NSAIDs  Hypertension at some point recommend adding ACE inhibitor  LOS: 1 day   Maelys Kinnick D 09/07/2011, 12:10 PM

## 2011-09-07 NOTE — Progress Notes (Signed)
Speech-language Pathology  Order received yesterday for MBS; pt underwent esophagram, results of which were normal.  Diet has been advanced to regular consistency with thin liquids.  Per chart review, MBS does not appear to be warranted.  SLP to sign-off.  Gigi Onstad L. Samson Frederic, [place] CCC/SLP Pager [phone]

## 2011-09-08 LAB — BASIC METABOLIC PANEL
BUN: 40 mg/dL — ABNORMAL HIGH (ref 6–23)
CO2: 19 mEq/L (ref 19–32)
Calcium: 8.7 mg/dL (ref 8.4–10.5)
Creatinine, Ser: 2.48 mg/dL — ABNORMAL HIGH (ref 0.50–1.10)
GFR calc non Af Amer: 21 mL/min — ABNORMAL LOW (ref >90)
Glucose, Bld: 220 mg/dL — ABNORMAL HIGH (ref 70–99)
Sodium: 134 mEq/L — ABNORMAL LOW (ref 135–145)

## 2011-09-08 LAB — GLUCOSE, CAPILLARY: Glucose-Capillary: 253 mg/dL — ABNORMAL HIGH (ref 70–99)

## 2011-09-08 MED ORDER — PANTOPRAZOLE SODIUM 40 MG PO TBEC
40.0000 mg | DELAYED_RELEASE_TABLET | Freq: Two times a day (BID) | ORAL | Status: DC
  Administered 2011-09-08: 40 mg via ORAL
  Filled 2011-09-08: qty 1

## 2011-09-08 MED ORDER — INSULIN GLARGINE 100 UNIT/ML ~~LOC~~ SOLN
14.0000 [IU] | Freq: Every day | SUBCUTANEOUS | Status: DC
  Administered 2011-09-08: 14 [IU] via SUBCUTANEOUS

## 2011-09-08 NOTE — Discharge Summary (Signed)
Physician Discharge Summary  Patient ID: [name] MRN: [id number] DOB/AGE: [id number] 52 y.o.  Admit date: 09/06/2011 Discharge date: 09/08/2011  Admission Diagnoses:  Discharge Diagnoses:  Principal Problem:  *Gastritis without bleeding Active Problems:  Hyponatremia  Acute kidney injury  Diabetes mellitus  Hyperglycemia  Hyperkalemia chronic kidney disease stage III Hyperkalemia rule out RTA  Discharged Condition: stable  Hospital Course:  Patient presented to the hospital with complaint of epigastric pain nausea, she states the symptoms occurred after trying to eat some food at a cookout. In the ED she was evaluated labs showed chronic kidney disease, she had a esophagram which was normal, no stricture noted. She was admitted for further evaluation. Please see dictated H&P for further details of past medical history medications social history past surgical history allergies. Patient's renal dysfunction status was unknown however outpatient labs were reviewed and showed a creatinine of 2.3 in April. Patient has been taking metformin, also NSAIDs. These were stopped on the hospital. Patient's creatinine was 2.4 at the time of discharge. She's been encouraged to keep herself hydrated and to avoid NSAIDs, not to take metformin. Currently she's been noncompliant with several of her medications. She states she had trouble affording her medication. She has been made aware that Lantus samples will be provided from the office. Renal ultrasound was negative for obstruction. Patient had tolerance of p.o. Diet without any problem. She did complain of some diabetic neuropathy in her feet, we've discussed this will be further evaluated on a outpatient basis  Consults:    Significant Diagnostic Studies:Us Abdomen Complete  09/06/2011  *RADIOLOGY REPORT*  Clinical Data:  Chest pain.  Epigastric pain.  History of hypertension and diabetes.  COMPLETE ABDOMINAL ULTRASOUND  Comparison:   05/25/2007  Findings:  Gallbladder:  Gallbladder wall is 2.6 mm, within normal limits.  No stones or pericholecystic fluid.  No sonographic Murphy's sign. A probable small polyp is identified within the gallbladder, measuring 4 mm in diameter.  No stones are present.  Common bile duct:  4.3 mm.  Liver:  No focal lesion identified.  Within normal limits in parenchymal echogenicity.  IVC:  Appears normal.  Pancreas:  The pancreatic tail is not well seen because of bowel gas.  Visualized portion of the pancreas has a normal appearance.  Spleen:  The spleen is normal appearance, 4.7 cm.  Right Kidney:  Echogenic parenchyma, 8.9 cm in appearance.  No focal mass or hydronephrosis.  Left Kidney:  Echogenic parenchyma, 9.8 cm in appearance.  No focal mass or hydronephrosis.  Abdominal aorta:  No aneurysm, 2.0 cm.  IMPRESSION:  1.  Probable small gallbladder polyp. 2.  No evidence for acute cholecystitis. 3.  Echogenic renal parenchyma bilaterally, suggesting intrinsic renal disease.  No hydronephrosis identified.  Original Report Authenticated By: Patterson Hammersmith, M.D.   Dg Esophagus W/water Sol Cm  09/06/2011  *RADIOLOGY REPORT*  Clinical Data: Throbbing chest pain after eating, no relief with glucagon  ESOPHOGRAM/BARIUM SWALLOW  Technique:  Single contrast examination was performed using water soluble contrast.  Fluoroscopy time:  26 seconds.  Comparison:  None.  Findings:  Normal oral phase of swallowing.  No laryngeal penetration or tracheobronchial aspiration.  Normal esophageal peristalsis.  No fixed esophageal narrowing or stricture.  No obstructing mass/debris was present in the esophagus.  Contrast passed freely into the stomach without delay.  IMPRESSION: Normal esophagram.  Original Report Authenticated By: Charline Bills, M.D.   past 24 hour(s))   GLUCOSE, CAPILLARY Status: Abnormal  Collection Time    09/06/11 5:21 PM   Component  Value  Range    Glucose-Capillary  344 (*)  70 - 99 (mg/dL)     TROPONIN I Status: Normal    Collection Time    09/06/11 5:54 PM   Component  Value  Range    Troponin I  <0.30  <0.30 (ng/mL)   BASIC METABOLIC PANEL Status: Abnormal    Collection Time    09/06/11 5:54 PM   Component  Value  Range    Sodium  132 (*)  135 - 145 (mEq/L)    Potassium  5.6 (*)  3.5 - 5.1 (mEq/L)    Chloride  101  96 - 112 (mEq/L)    CO2  18 (*)  19 - 32 (mEq/L)    Glucose, Bld  382 (*)  70 - 99 (mg/dL)    BUN  50 (*)  6 - 23 (mg/dL)    Creatinine, Ser  1.61 (*)  0.50 - 1.10 (mg/dL)    Calcium  9.4  8.4 - 10.5 (mg/dL)    GFR calc non Af Amer  23 (*)  >90 (mL/min)    GFR calc Af Amer  26 (*)  >90 (mL/min)   GLUCOSE, CAPILLARY Status: Abnormal    Collection Time    09/06/11 8:08 PM   Component  Value  Range    Glucose-Capillary  397 (*)  70 - 99 (mg/dL)    Comment 1  Notify RN    GLUCOSE, CAPILLARY Status: Abnormal    Collection Time    09/06/11 11:29 PM   Component  Value  Range    Glucose-Capillary  300 (*)  70 - 99 (mg/dL)    Comment 1  Notify RN    URINALYSIS, ROUTINE W REFLEX MICROSCOPIC Status: Abnormal    Collection Time    09/07/11 5:23 AM   Component  Value  Range    Color, Urine  YELLOW  YELLOW    APPearance  CLEAR  CLEAR    Specific Gravity, Urine  1.020  1.005 - 1.030    pH  5.5  5.0 - 8.0    Glucose, UA  >1000 (*)  NEGATIVE (mg/dL)    Hgb urine dipstick  SMALL (*)  NEGATIVE    Bilirubin Urine  NEGATIVE  NEGATIVE    Ketones, ur  15 (*)  NEGATIVE (mg/dL)    Protein, ur  >096 (*)  NEGATIVE (mg/dL)    Urobilinogen, UA  0.2  0.0 - 1.0 (mg/dL)    Nitrite  NEGATIVE  NEGATIVE    Leukocytes, UA  NEGATIVE  NEGATIVE   URINE MICROSCOPIC-ADD ON Status: Normal    Collection Time    09/07/11 5:23 AM   Component  Value  Range    Squamous Epithelial / LPF  RARE  RARE    WBC, UA  0-2  <3 (WBC/hpf)    RBC / HPF  3-6  <3 (RBC/hpf)    Bacteria, UA  RARE  RARE    Urine-Other  RARE YEAST    COMPREHENSIVE METABOLIC PANEL Status: Abnormal    Collection Time     09/07/11 5:37 AM   Component  Value  Range    Sodium  133 (*)  135 - 145 (mEq/L)    Potassium  4.9  3.5 - 5.1 (mEq/L)    Chloride  103  96 - 112 (mEq/L)    CO2  20  19 - 32 (mEq/L)    Glucose, Bld  99  70 - 99 (mg/dL)    BUN  45 (*)  6 - 23 (mg/dL)    Creatinine, Ser  1.61 (*)  0.50 - 1.10 (mg/dL)    Calcium  9.1  8.4 - 10.5 (mg/dL)    Total Protein  6.3  6.0 - 8.3 (g/dL)    Albumin  2.9 (*)  3.5 - 5.2 (g/dL)    AST  11  0 - 37 (U/L)    ALT  8  0 - 35 (U/L)    Alkaline Phosphatase  113  39 - 117 (U/L)    Total Bilirubin  0.2 (*)  0.3 - 1.2 (mg/dL)    GFR calc non Af Amer  22 (*)  >90 (mL/min)    GFR calc Af Amer  26 (*)  >90 (mL/min)   GLUCOSE, CAPILLARY Status: Normal    Collection Time    09/07/11 7:48 AM   Component  Value  Range    Glucose-Capillary  98  70 - 99 (mg/dL)         Discharge Exam: Blood pressure 160/70, pulse 64, temperature 97.9 F (36.6 C), temperature source Oral, resp. rate 17, height 5' 1.5" (1.562 m), weight 86.4 kg (190 lb 7.6 oz), SpO2 97.00%. General appearance: alert and cooperative Resp: clear to auscultation bilaterally Cardio: regular rate and rhythm, S1, S2 normal, no murmur, click, rub or gallop GI: soft, non-tender; bowel sounds normal; no masses,  no organomegaly Extremities: extremities normal, atraumatic, no cyanosis or edema  Disposition: home   Medication List  As of 09/08/2011 11:20 AM   STOP taking these medications         aluminum & magnesium hydroxide 225-200 MG/5ML suspension      ibuprofen 200 MG tablet      metFORMIN 500 MG tablet      neomycin-bacitracin-polymyxin Oint         TAKE these medications         atenolol 50 MG tablet   Commonly known as: TENORMIN   Take 50 mg by mouth 2 (two) times daily.      famotidine 10 MG chewable tablet   Commonly known as: PEPCID AC   Chew 10-20 mg by mouth 2 (two) times daily as needed. For nausea or queasiness      felodipine 5 MG 24 hr tablet   Commonly known as: PLENDIL    Take 5 mg by mouth at bedtime.      ferrous gluconate 325 MG tablet   Commonly known as: FERGON   Take 325-650 mg by mouth 2 (two) times daily. 2 tablets in the morning and 1 tablet at night      glyBURIDE 5 MG tablet   Commonly known as: DIABETA   Take 10 mg by mouth 2 (two) times daily with a meal.      insulin glargine 100 UNIT/ML injection   Commonly known as: LANTUS   Inject 10-15 Units into the skin at bedtime as needed. If blood sugars up to 170-180           Follow-up Information    Follow up with Katy Apo, MD. Schedule an appointment as soon as possible for a visit in 2 days. (need labs drawn at appt)    Contact information:   [address] [place] [address] [phone]          Signed: Katy Apo 09/08/2011, 11:20 AM

## 2011-09-08 NOTE — Progress Notes (Signed)
Pt discharged to home per MD. IV removed, dressing and pressure applied, site unremarkable.  Discharge instructions and follow-up appointments given.  All belongings sent with pt and all questions answered.  Pt transported in wheelchair by volunteer and traveled home with family by private vehicle.

## 2011-09-08 NOTE — Progress Notes (Signed)
Inpatient Diabetes Program Recommendations  AACE/ADA: New Consensus Statement on Inpatient Glycemic Control (2009)  Target Ranges:  Prepandial:   less than 140 mg/dL      Peak postprandial:   less than 180 mg/dL (1-2 hours)      Critically ill patients:  140 - 180 mg/dL   Reason for Visit: Results for ZHANIYA, SWALLOWS (MRN [id number]) as of 09/08/2011 11:00  Ref. Range 09/07/2011 07:48 09/07/2011 12:15 09/07/2011 17:09 09/07/2011 22:38 09/08/2011 08:01  Glucose-Capillary Latest Range: 70-99 mg/dL 98 409 (H) 811 (H) 914 (H) 223 (H)    Inpatient Diabetes Program Recommendations Insulin - Basal: Agree with start of Lantus today.  Note that pt. was on prn lantus prior to admit.  Will need to titrate according to fasting CBG's. HgbA1C: Please check A1C to determine prehospitalization glycemic control.  Note: Will follow.

## 2011-09-08 NOTE — Progress Notes (Signed)
Pharmacist to Physician Communication:  Protonix IV to PO  This patient has been identified as able to take oral medications (tolerating diet of full liquids or better or gastric tube feedings for >24 hours OR taking other scheduled oral medications for >24 hours) and expected plan for continued treatment is at least 1 day.   This patient does not meet any of the exclusion criteria: Active GI bleeding or impaired GI absorption (gastrectomy, short bowel or on TNA or NPO) or s/p esophagectomy.  Esophagram is normal.  Protonix has been converted to PO/PT route (1:1).  Thank you!  Rolland Porter, Pharm.D., BCPS Clinical Pharmacist Pager: [phone]

## 2012-05-05 ENCOUNTER — Other Ambulatory Visit (HOSPITAL_COMMUNITY): Payer: Self-pay | Admitting: Neurosurgery

## 2012-05-05 DIAGNOSIS — M545 Low back pain: Secondary | ICD-10-CM

## 2012-05-10 ENCOUNTER — Ambulatory Visit (HOSPITAL_COMMUNITY)
Admission: RE | Admit: 2012-05-10 | Discharge: 2012-05-10 | Payer: Self-pay | Source: Ambulatory Visit | Attending: Neurosurgery | Admitting: Neurosurgery

## 2012-05-13 ENCOUNTER — Ambulatory Visit (HOSPITAL_COMMUNITY)
Admission: RE | Admit: 2012-05-13 | Discharge: 2012-05-13 | Disposition: A | Discharge status: Home / Self Care | Payer: Self-pay | Source: Ambulatory Visit | Attending: Neurosurgery | Admitting: Neurosurgery

## 2012-05-13 ENCOUNTER — Other Ambulatory Visit (HOSPITAL_COMMUNITY): Payer: Self-pay | Admitting: Neurosurgery

## 2012-05-13 DIAGNOSIS — M545 Low back pain, unspecified: Secondary | ICD-10-CM | POA: Insufficient documentation

## 2012-05-13 DIAGNOSIS — M5126 Other intervertebral disc displacement, lumbar region: Secondary | ICD-10-CM | POA: Insufficient documentation

## 2012-05-13 LAB — CREATININE, SERUM: GFR calc Af Amer: 19 mL/min — ABNORMAL LOW (ref >90)

## 2012-05-13 MED ORDER — GADOBENATE DIMEGLUMINE 529 MG/ML IV SOLN
18.0000 mL | Freq: Once | INTRAVENOUS | Status: AC | PRN
  Administered 2012-05-13: 18 mL via INTRAVENOUS

## 2013-07-10 ENCOUNTER — Inpatient Hospital Stay (HOSPITAL_COMMUNITY): Payer: Medicaid Other

## 2013-07-10 ENCOUNTER — Encounter (HOSPITAL_COMMUNITY): Payer: Self-pay | Admitting: Emergency Medicine

## 2013-07-10 ENCOUNTER — Emergency Department (HOSPITAL_COMMUNITY): Payer: Medicaid Other

## 2013-07-10 ENCOUNTER — Inpatient Hospital Stay (HOSPITAL_COMMUNITY)
Admission: EM | Admit: 2013-07-10 | Discharge: 2013-07-26 | DRG: 674 | Disposition: A | Discharge status: Home / Self Care | Payer: Medicaid Other | Attending: Internal Medicine | Admitting: Internal Medicine

## 2013-07-10 DIAGNOSIS — Z79899 Other long term (current) drug therapy: Secondary | ICD-10-CM | POA: Diagnosis not present

## 2013-07-10 DIAGNOSIS — E1165 Type 2 diabetes mellitus with hyperglycemia: Secondary | ICD-10-CM | POA: Diagnosis present

## 2013-07-10 DIAGNOSIS — D631 Anemia in chronic kidney disease: Secondary | ICD-10-CM

## 2013-07-10 DIAGNOSIS — Z9119 Patient's noncompliance with other medical treatment and regimen: Secondary | ICD-10-CM

## 2013-07-10 DIAGNOSIS — E1029 Type 1 diabetes mellitus with other diabetic kidney complication: Secondary | ICD-10-CM | POA: Diagnosis present

## 2013-07-10 DIAGNOSIS — N058 Unspecified nephritic syndrome with other morphologic changes: Secondary | ICD-10-CM | POA: Diagnosis present

## 2013-07-10 DIAGNOSIS — N179 Acute kidney failure, unspecified: Secondary | ICD-10-CM | POA: Diagnosis present

## 2013-07-10 DIAGNOSIS — Z91199 Patient's noncompliance with other medical treatment and regimen due to unspecified reason: Secondary | ICD-10-CM | POA: Diagnosis not present

## 2013-07-10 DIAGNOSIS — R0601 Orthopnea: Secondary | ICD-10-CM | POA: Diagnosis present

## 2013-07-10 DIAGNOSIS — Z794 Long term (current) use of insulin: Secondary | ICD-10-CM

## 2013-07-10 DIAGNOSIS — E1110 Type 2 diabetes mellitus with ketoacidosis without coma: Secondary | ICD-10-CM | POA: Diagnosis present

## 2013-07-10 DIAGNOSIS — J45909 Unspecified asthma, uncomplicated: Secondary | ICD-10-CM | POA: Diagnosis present

## 2013-07-10 DIAGNOSIS — G4700 Insomnia, unspecified: Secondary | ICD-10-CM | POA: Diagnosis present

## 2013-07-10 DIAGNOSIS — N039 Chronic nephritic syndrome with unspecified morphologic changes: Secondary | ICD-10-CM

## 2013-07-10 DIAGNOSIS — F411 Generalized anxiety disorder: Secondary | ICD-10-CM | POA: Diagnosis not present

## 2013-07-10 DIAGNOSIS — Z992 Dependence on renal dialysis: Secondary | ICD-10-CM | POA: Diagnosis not present

## 2013-07-10 DIAGNOSIS — E872 Acidosis, unspecified: Secondary | ICD-10-CM | POA: Diagnosis present

## 2013-07-10 DIAGNOSIS — I10 Essential (primary) hypertension: Secondary | ICD-10-CM | POA: Diagnosis present

## 2013-07-10 DIAGNOSIS — N189 Chronic kidney disease, unspecified: Secondary | ICD-10-CM

## 2013-07-10 DIAGNOSIS — E870 Hyperosmolality and hypernatremia: Secondary | ICD-10-CM | POA: Diagnosis present

## 2013-07-10 DIAGNOSIS — I120 Hypertensive chronic kidney disease with stage 5 chronic kidney disease or end stage renal disease: Secondary | ICD-10-CM | POA: Diagnosis present

## 2013-07-10 DIAGNOSIS — I6529 Occlusion and stenosis of unspecified carotid artery: Secondary | ICD-10-CM | POA: Diagnosis present

## 2013-07-10 DIAGNOSIS — F3289 Other specified depressive episodes: Secondary | ICD-10-CM | POA: Diagnosis present

## 2013-07-10 DIAGNOSIS — R109 Unspecified abdominal pain: Secondary | ICD-10-CM | POA: Diagnosis not present

## 2013-07-10 DIAGNOSIS — E1065 Type 1 diabetes mellitus with hyperglycemia: Secondary | ICD-10-CM | POA: Diagnosis present

## 2013-07-10 DIAGNOSIS — N63 Unspecified lump in unspecified breast: Secondary | ICD-10-CM | POA: Diagnosis not present

## 2013-07-10 DIAGNOSIS — E1129 Type 2 diabetes mellitus with other diabetic kidney complication: Secondary | ICD-10-CM | POA: Diagnosis present

## 2013-07-10 DIAGNOSIS — D638 Anemia in other chronic diseases classified elsewhere: Secondary | ICD-10-CM | POA: Diagnosis present

## 2013-07-10 DIAGNOSIS — N186 End stage renal disease: Secondary | ICD-10-CM | POA: Diagnosis present

## 2013-07-10 DIAGNOSIS — E669 Obesity, unspecified: Secondary | ICD-10-CM | POA: Diagnosis present

## 2013-07-10 DIAGNOSIS — D649 Anemia, unspecified: Secondary | ICD-10-CM | POA: Diagnosis present

## 2013-07-10 DIAGNOSIS — E875 Hyperkalemia: Secondary | ICD-10-CM | POA: Diagnosis present

## 2013-07-10 DIAGNOSIS — F329 Major depressive disorder, single episode, unspecified: Secondary | ICD-10-CM | POA: Diagnosis present

## 2013-07-10 DIAGNOSIS — J9819 Other pulmonary collapse: Secondary | ICD-10-CM | POA: Diagnosis present

## 2013-07-10 DIAGNOSIS — R0600 Dyspnea, unspecified: Secondary | ICD-10-CM | POA: Diagnosis present

## 2013-07-10 DIAGNOSIS — N2581 Secondary hyperparathyroidism of renal origin: Secondary | ICD-10-CM | POA: Diagnosis present

## 2013-07-10 DIAGNOSIS — R112 Nausea with vomiting, unspecified: Secondary | ICD-10-CM | POA: Diagnosis present

## 2013-07-10 LAB — CBC WITH DIFFERENTIAL/PLATELET
BASOS ABS: 0 10*3/uL (ref 0.0–0.1)
BASOS PCT: 0 % (ref 0–1)
Eosinophils Absolute: 0.1 10*3/uL (ref 0.0–0.7)
Eosinophils Relative: 1 % (ref 0–5)
HEMATOCRIT: 32.8 % — AB (ref 36.0–46.0)
Hemoglobin: 11.2 g/dL — ABNORMAL LOW (ref 12.0–15.0)
LYMPHS PCT: 18 % (ref 12–46)
Lymphs Abs: 1 10*3/uL (ref 0.7–4.0)
MCH: 26.5 pg (ref 26.0–34.0)
MCHC: 34.1 g/dL (ref 30.0–36.0)
MCV: 77.7 fL — ABNORMAL LOW (ref 78.0–100.0)
Monocytes Absolute: 0.4 10*3/uL (ref 0.1–1.0)
Monocytes Relative: 7 % (ref 3–12)
NEUTROS ABS: 4.1 10*3/uL (ref 1.7–7.7)
NEUTROS PCT: 74 % (ref 43–77)
Platelets: 186 10*3/uL (ref 150–400)
RBC: 4.22 MIL/uL (ref 3.87–5.11)
RDW: 13.2 % (ref 11.5–15.5)
WBC: 5.5 10*3/uL (ref 4.0–10.5)

## 2013-07-10 LAB — BASIC METABOLIC PANEL
BUN: 63 mg/dL — ABNORMAL HIGH (ref 6–23)
BUN: 61 mg/dL — ABNORMAL HIGH (ref 6–23)
CALCIUM: 8.6 mg/dL (ref 8.4–10.5)
CHLORIDE: 98 meq/L (ref 96–112)
CO2: 17 meq/L — AB (ref 19–32)
CO2: 17 meq/L — AB (ref 19–32)
CREATININE: 5.26 mg/dL — AB (ref 0.50–1.10)
Calcium: 9 mg/dL (ref 8.4–10.5)
Chloride: 97 mEq/L (ref 96–112)
Creatinine, Ser: 5.31 mg/dL — ABNORMAL HIGH (ref 0.50–1.10)
GFR calc Af Amer: 10 mL/min — ABNORMAL LOW (ref >90)
GFR calc Af Amer: 10 mL/min — ABNORMAL LOW (ref >90)
GFR calc non Af Amer: 8 mL/min — ABNORMAL LOW (ref >90)
GFR calc non Af Amer: 8 mL/min — ABNORMAL LOW (ref >90)
GLUCOSE: 141 mg/dL — AB (ref 70–99)
Glucose, Bld: 128 mg/dL — ABNORMAL HIGH (ref 70–99)
POTASSIUM: 4.5 meq/L (ref 3.7–5.3)
Potassium: 4.4 mEq/L (ref 3.7–5.3)
Sodium: 133 mEq/L — ABNORMAL LOW (ref 137–147)
Sodium: 134 mEq/L — ABNORMAL LOW (ref 137–147)

## 2013-07-10 LAB — I-STAT VENOUS BLOOD GAS, ED
Acid-base deficit: 5 mmol/L — ABNORMAL HIGH (ref 0.0–2.0)
BICARBONATE: 20.7 meq/L (ref 20.0–24.0)
O2 SAT: 49 %
PH VEN: 7.314 — AB (ref 7.250–7.300)
TCO2: 22 mmol/L (ref 0–100)
pCO2, Ven: 40.7 mmHg — ABNORMAL LOW (ref 45.0–50.0)
pO2, Ven: 29 mmHg — CL (ref 30.0–45.0)

## 2013-07-10 LAB — COMPREHENSIVE METABOLIC PANEL
ALBUMIN: 2.6 g/dL — AB (ref 3.5–5.2)
ALK PHOS: 130 U/L — AB (ref 39–117)
ALT: 17 U/L (ref 0–35)
AST: 12 U/L (ref 0–37)
BUN: 65 mg/dL — ABNORMAL HIGH (ref 6–23)
CHLORIDE: 94 meq/L — AB (ref 96–112)
CO2: 18 meq/L — AB (ref 19–32)
Calcium: 8.9 mg/dL (ref 8.4–10.5)
Creatinine, Ser: 5.33 mg/dL — ABNORMAL HIGH (ref 0.50–1.10)
GFR calc Af Amer: 10 mL/min — ABNORMAL LOW (ref >90)
GFR, EST NON AFRICAN AMERICAN: 8 mL/min — AB (ref >90)
Glucose, Bld: 481 mg/dL — ABNORMAL HIGH (ref 70–99)
POTASSIUM: 5 meq/L (ref 3.7–5.3)
SODIUM: 131 meq/L — AB (ref 137–147)
Total Protein: 6.1 g/dL (ref 6.0–8.3)

## 2013-07-10 LAB — PROTEIN / CREATININE RATIO, URINE
Creatinine, Urine: 40.53 mg/dL
PROTEIN CREATININE RATIO: 14.76 — AB (ref 0.00–0.15)
TOTAL PROTEIN, URINE: 598.1 mg/dL

## 2013-07-10 LAB — URINALYSIS, ROUTINE W REFLEX MICROSCOPIC
BILIRUBIN URINE: NEGATIVE
KETONES UR: NEGATIVE mg/dL
Leukocytes, UA: NEGATIVE
Nitrite: NEGATIVE
Specific Gravity, Urine: 1.021 (ref 1.005–1.030)
Urobilinogen, UA: 0.2 mg/dL (ref 0.0–1.0)
pH: 6.5 (ref 5.0–8.0)

## 2013-07-10 LAB — GLUCOSE, CAPILLARY: Glucose-Capillary: 163 mg/dL — ABNORMAL HIGH (ref 70–99)

## 2013-07-10 LAB — CBG MONITORING, ED
GLUCOSE-CAPILLARY: 473 mg/dL — AB (ref 70–99)
GLUCOSE-CAPILLARY: 91 mg/dL (ref 70–99)
Glucose-Capillary: 436 mg/dL — ABNORMAL HIGH (ref 70–99)
Glucose-Capillary: 374 mg/dL — ABNORMAL HIGH (ref 70–99)
Glucose-Capillary: 187 mg/dL — ABNORMAL HIGH (ref 70–99)

## 2013-07-10 LAB — URINE MICROSCOPIC-ADD ON

## 2013-07-10 LAB — PRO B NATRIURETIC PEPTIDE: PRO B NATRI PEPTIDE: 15005 pg/mL — AB (ref 0–125)

## 2013-07-10 LAB — I-STAT TROPONIN, ED: Troponin i, poc: 0.02 ng/mL (ref 0.00–0.08)

## 2013-07-10 MED ORDER — HYDRALAZINE HCL 20 MG/ML IJ SOLN
INTRAMUSCULAR | Status: AC
  Filled 2013-07-10: qty 1

## 2013-07-10 MED ORDER — DEXTROSE 5 % IV SOLN
500.0000 mg | INTRAVENOUS | Status: DC
  Administered 2013-07-11 – 2013-07-12 (×2): 500 mg via INTRAVENOUS
  Filled 2013-07-10 (×4): qty 500

## 2013-07-10 MED ORDER — ACETAMINOPHEN 325 MG PO TABS
650.0000 mg | ORAL_TABLET | Freq: Four times a day (QID) | ORAL | Status: DC | PRN
  Administered 2013-07-11 – 2013-07-20 (×5): 650 mg via ORAL
  Filled 2013-07-10 (×7): qty 2

## 2013-07-10 MED ORDER — DEXTROSE 5 % IV SOLN
1.0000 g | INTRAVENOUS | Status: DC
  Administered 2013-07-11 – 2013-07-12 (×2): 1 g via INTRAVENOUS
  Filled 2013-07-10 (×3): qty 10

## 2013-07-10 MED ORDER — DEXTROSE-NACL 5-0.45 % IV SOLN
INTRAVENOUS | Status: DC
  Administered 2013-07-10: 19:00:00 via INTRAVENOUS

## 2013-07-10 MED ORDER — ONDANSETRON HCL 4 MG PO TABS
4.0000 mg | ORAL_TABLET | Freq: Four times a day (QID) | ORAL | Status: DC | PRN
  Administered 2013-07-15 – 2013-07-19 (×2): 4 mg via ORAL
  Filled 2013-07-10 (×3): qty 1

## 2013-07-10 MED ORDER — ONDANSETRON HCL 4 MG/2ML IJ SOLN
4.0000 mg | Freq: Four times a day (QID) | INTRAMUSCULAR | Status: DC | PRN
  Administered 2013-07-10 – 2013-07-23 (×7): 4 mg via INTRAVENOUS
  Filled 2013-07-10 (×8): qty 2

## 2013-07-10 MED ORDER — ATENOLOL 50 MG PO TABS
50.0000 mg | ORAL_TABLET | Freq: Two times a day (BID) | ORAL | Status: DC
  Administered 2013-07-11 – 2013-07-16 (×11): 50 mg via ORAL
  Filled 2013-07-10 (×15): qty 1

## 2013-07-10 MED ORDER — SODIUM CHLORIDE 0.9 % IV SOLN
INTRAVENOUS | Status: DC
  Filled 2013-07-10 (×2): qty 1

## 2013-07-10 MED ORDER — ENOXAPARIN SODIUM 30 MG/0.3ML ~~LOC~~ SOLN: 30.0000 mg | SUBCUTANEOUS | Status: DC

## 2013-07-10 MED ORDER — DEXTROSE 5 % IV SOLN
1.0000 g | Freq: Once | INTRAVENOUS | Status: AC
  Administered 2013-07-10: 1 g via INTRAVENOUS
  Filled 2013-07-10: qty 10

## 2013-07-10 MED ORDER — INSULIN GLARGINE 100 UNIT/ML ~~LOC~~ SOLN
5.0000 [IU] | Freq: Every day | SUBCUTANEOUS | Status: DC
  Administered 2013-07-11 – 2013-07-16 (×7): 5 [IU] via SUBCUTANEOUS
  Filled 2013-07-10 (×8): qty 0.05

## 2013-07-10 MED ORDER — SODIUM CHLORIDE 0.9 % IV SOLN
INTRAVENOUS | Status: DC
Start: 2013-07-10 — End: 2013-07-26
  Administered 2013-07-18: 12:00:00 via INTRAVENOUS

## 2013-07-10 MED ORDER — INSULIN ASPART 100 UNIT/ML ~~LOC~~ SOLN
0.0000 [IU] | SUBCUTANEOUS | Status: DC
  Administered 2013-07-11 (×3): 2 [IU] via SUBCUTANEOUS
  Administered 2013-07-11: 3 [IU] via SUBCUTANEOUS
  Administered 2013-07-11: 2 [IU] via SUBCUTANEOUS
  Administered 2013-07-13 (×2): 3 [IU] via SUBCUTANEOUS
  Administered 2013-07-13 (×2): 2 [IU] via SUBCUTANEOUS
  Administered 2013-07-14: 1 [IU] via SUBCUTANEOUS
  Administered 2013-07-14: 2 [IU] via SUBCUTANEOUS
  Administered 2013-07-14: 1 [IU] via SUBCUTANEOUS
  Administered 2013-07-14: 5 [IU] via SUBCUTANEOUS
  Administered 2013-07-14: 2 [IU] via SUBCUTANEOUS
  Administered 2013-07-15: 3 [IU] via SUBCUTANEOUS
  Administered 2013-07-15: 2 [IU] via SUBCUTANEOUS

## 2013-07-10 MED ORDER — HYDROMORPHONE HCL PF 1 MG/ML IJ SOLN
1.0000 mg | INTRAMUSCULAR | Status: DC | PRN
  Administered 2013-07-11 – 2013-07-21 (×3): 1 mg via INTRAVENOUS
  Filled 2013-07-10 (×3): qty 1

## 2013-07-10 MED ORDER — DEXTROSE 5 % IV SOLN
500.0000 mg | Freq: Once | INTRAVENOUS | Status: AC
  Administered 2013-07-10: 500 mg via INTRAVENOUS

## 2013-07-10 MED ORDER — HYDROCODONE-ACETAMINOPHEN 5-325 MG PO TABS
1.0000 | ORAL_TABLET | ORAL | Status: DC | PRN
  Administered 2013-07-10 – 2013-07-26 (×3): 1 via ORAL
  Filled 2013-07-10 (×3): qty 1

## 2013-07-10 MED ORDER — HYDRALAZINE HCL 20 MG/ML IJ SOLN
10.0000 mg | Freq: Four times a day (QID) | INTRAMUSCULAR | Status: DC | PRN
  Administered 2013-07-11 – 2013-07-12 (×3): 10 mg via INTRAVENOUS
  Filled 2013-07-10 (×3): qty 1

## 2013-07-10 MED ORDER — ENOXAPARIN SODIUM 30 MG/0.3ML ~~LOC~~ SOLN
30.0000 mg | SUBCUTANEOUS | Status: DC
  Administered 2013-07-11 – 2013-07-16 (×6): 30 mg via SUBCUTANEOUS
  Filled 2013-07-10 (×6): qty 0.3

## 2013-07-10 MED ORDER — ONDANSETRON HCL 4 MG/2ML IJ SOLN
4.0000 mg | Freq: Once | INTRAMUSCULAR | Status: AC
  Administered 2013-07-10: 4 mg via INTRAVENOUS
  Filled 2013-07-10: qty 2

## 2013-07-10 MED ORDER — HYDRALAZINE HCL 20 MG/ML IJ SOLN
10.0000 mg | Freq: Once | INTRAMUSCULAR | Status: AC
  Administered 2013-07-10: 20 mg via INTRAVENOUS
  Filled 2013-07-10: qty 1

## 2013-07-10 MED ORDER — DEXTROSE 50 % IV SOLN: 25.0000 mL | INTRAVENOUS | Status: DC | PRN

## 2013-07-10 MED ORDER — SODIUM CHLORIDE 0.9 % IV SOLN
INTRAVENOUS | Status: DC
  Administered 2013-07-10: 3.8 [IU]/h via INTRAVENOUS
  Filled 2013-07-10: qty 1

## 2013-07-10 MED ORDER — DEXTROSE-NACL 5-0.45 % IV SOLN: INTRAVENOUS | Status: DC

## 2013-07-10 MED ORDER — ACETAMINOPHEN 650 MG RE SUPP: 650.0000 mg | Freq: Four times a day (QID) | RECTAL | Status: DC | PRN

## 2013-07-10 MED ORDER — HYDRALAZINE HCL 25 MG PO TABS
25.0000 mg | ORAL_TABLET | Freq: Two times a day (BID) | ORAL | Status: DC
  Administered 2013-07-11 (×2): 25 mg via ORAL
  Filled 2013-07-10 (×3): qty 1

## 2013-07-10 NOTE — H&P (Signed)
History and Physical       Hospital Admission Note Date: 07/10/2013  Patient name: [name] Medical record number: [id number] Date of birth: 06/04/1960 Age: 53 y.o. Gender: female  PCP: GATES,ROBERT NEVILL, MD    Chief Complaint:  Short of breath for last 3 days, worsened since yesterday  HPI: Patient is a 53 year old female with history of hypertension, diabetes mellitus, chronic kidney disease, who stated that her creatinine function was around 3 year ago, presented to the ED with shortness of breath. History was obtained from the patient who stated that she started feeling short of breath on Friday 3 days ago, she thought she had seasonal allergies. However yesterday she could not breathe and had significant orthopnea since last night and has to use 4 pillows, sitting upright. Patient had no coughing, fevers or chills. She states that she has been drinking a lot and urinating a lot as well. Per patient she was told by her PCP a year ago that she needs to see a nephrologist however she did not follow up. She was taken off of metformin secondary to her chronic kidney disease, was recommended to use Lantus insulin one year ago. Patient however reports that she was not able to afford it and hence she has not used insulin. She has been on glyburide. ER workup showed BUN of 65, creatinine 5.3, bicarbonate 18, glucose 481. Anion gap was 19, BNP 15,005. UA negative for ketones, more than 300 protein with glucosuria. Vital signs showed BP of 210/118, pulse 70 Hospitalist service was requested for admission.  Review of Systems:  Constitutional: Denies fever, chills, diaphoresis,+  poor appetite and fatigue.  HEENT: Denies photophobia, eye pain, redness, hearing loss, ear pain, congestion, sore throat, rhinorrhea, sneezing, mouth sores, trouble swallowing, neck pain, neck stiffness and tinnitus.   Respiratory please see history of present  illness Cardiovascular: Denies chest pain, palpitations and leg swelling.  Gastrointestinal: Denies nausea, vomiting, abdominal pain, diarrhea, constipation, blood in stool and abdominal distention.  Genitourinary: Denies dysuria, urgency, hematuria, flank pain and difficulty urinating.  increased frequency but no dysuria Musculoskeletal: Denies myalgias, back pain, joint swelling, arthralgias and gait problem.  Skin: Denies pallor, rash and wound.  Neurological: Denies dizziness, seizures, syncope, weakness, light-headedness, numbness and headaches.  Hematological: Denies adenopathy. Easy bruising, personal or family bleeding history  Psychiatric/Behavioral: Denies suicidal ideation, mood changes, confusion, nervousness, sleep disturbance and agitation  Past Medical History: Past Medical History  Diagnosis Date  . Hypertension   . Diabetes mellitus   . Anemia    Past Surgical History  Procedure Laterality Date  . Back surgery  17 years    Medications: Prior to Admission medications   Medication Sig Start Date End Date Taking? Authorizing Provider  acetaminophen (TYLENOL) 325 MG tablet Take 650 mg by mouth every 6 (six) hours as needed for fever.   Yes Historical Provider, MD  albuterol (PROVENTIL) (2.5 MG/3ML) 0.083% nebulizer solution Take 2.5 mg by nebulization every 6 (six) hours as needed for wheezing or shortness of breath.   Yes Historical Provider, MD  atenolol (TENORMIN) 50 MG tablet Take 50 mg by mouth 2 (two) times daily.   Yes Historical Provider, MD  felodipine (PLENDIL) 5 MG 24 hr tablet Take 5 mg by mouth at bedtime.   Yes Historical Provider, MD  ferrous gluconate (FERGON) 325 MG tablet Take 325-650 mg by mouth 2 (two) times daily. 2 tablets in the morning and 1 tablet at night   Yes Historical Provider, MD  glyBURIDE (DIABETA) 5 MG tablet Take 10 mg by mouth 2 (two) times daily with a meal.   Yes Historical Provider, MD    Allergies:   Allergies  Allergen  Reactions  . Codeine Other (See Comments)    hallucinations    Social History:  reports that she has never smoked. She does not have any smokeless tobacco history on file. She reports that she does not drink alcohol or use illicit drugs.  Family History: History reviewed. No pertinent family history.  Physical Exam: Blood pressure 203/107, pulse 70, temperature 97.8 F (36.6 C), temperature source Oral, resp. rate 19, SpO2 98.00%. General: Alert, awake, oriented x3, in no acute distress. HEENT: normocephalic, atraumatic, anicteric sclera, pink conjunctiva, pupils equal and reactive to light and accomodation, oropharynx clear, puffy eyes Neck: supple, no masses or lymphadenopathy, no goiter, no bruits  Heart: Regular rate and rhythm, without murmurs, rubs or gallops. Lungs: Clear to auscultation bilaterally, no wheezing, rales or rhonchi. Abdomen: Soft, nontender, nondistended, positive bowel sounds, no masses. Extremities: No clubbing, cyanosis or edema with positive pedal pulses. Neuro: Grossly intact, no focal neurological deficits, strength 5/5 upper and lower extremities bilaterally Psych: alert and oriented x 3, normal mood and affect Skin: no rashes or lesions, warm and dry   LABS on Admission:  Basic Metabolic Panel:  Recent Labs Lab 07/10/13 1330  NA 131*  K 5.0  CL 94*  CO2 18*  GLUCOSE 481*  BUN 65*  CREATININE 5.33*  CALCIUM 8.9   Liver Function Tests:  Recent Labs Lab 07/10/13 1330  AST 12  ALT 17  ALKPHOS 130*  BILITOT <0.2*  PROT 6.1  ALBUMIN 2.6*   No results found for this basename: LIPASE, AMYLASE,  in the last 168 hours No results found for this basename: AMMONIA,  in the last 168 hours CBC:  Recent Labs Lab 07/10/13 1330  WBC 5.5  NEUTROABS 4.1  HGB 11.2*  HCT 32.8*  MCV 77.7*  PLT 186   Cardiac Enzymes: No results found for this basename: CKTOTAL, CKMB, CKMBINDEX, TROPONINI,  in the last 168 hours BNP: No components found with  this basename: POCBNP,  CBG:  Recent Labs Lab 07/10/13 1243  GLUCAP 473*     Radiological Exams on Admission: Dg Chest 2 View  07/10/2013   CLINICAL DATA:  Orthopnea  EXAM: CHEST  2 VIEW  COMPARISON:  None.  FINDINGS: The lungs are adequately inflated. There is hazy increased density projecting over the lower thoracic spine which is likely on the left on the frontal film. This may reflect atelectasis or pneumonia. The cardiac silhouette is normal in size. The pulmonary vascularity is mildly prominent centrally. The mediastinum is normal in width. There is a trace of blunting of the costophrenic angles. The bony structures exhibit no acute abnormalities.  IMPRESSION: 1. Increased density projecting over the lower thoracic spine on the lateral film likely corresponds to density in the retrocardiac region on the frontal film. This is consistent with pneumonia. 2. One cannot exclude low-grade compensated CHF.   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 Eliott Nine. Maryclare Labrador defer to nephrology regarding the fluid balance if patient needs diuretics. -  Obtain a renal ultrasound, SPEP, UPEP, Urine Lites, 24 hours urine for proteinuria  Active Problems:   Hyperkalemia: Likely due to #1    DKA (diabetic ketoacidoses): Benign Is 19, however urine ketones are negative, likely hyperosmolar hyperglycemia. - Will place on IV insulin drip, will need subcutaneous insulin once gap is closing, blood sugars less than 180x4.  - Obtain hemoglobin A1c, will need extensive diabetic teaching and insulin at the time of discharge - Will place case management consult for medication assistance     Malignant hypertension  - will continue beta blocker, and add hydralazine   Community acquired pneumonia : Per chest x-ray finding - Placed on IV Rocephin and the Zithromax, obtain urine strep antigen, urine legionella antigen     Noncompliance: Patient was strongly counseled for compliance with her management, medications      Anemia: Likely anemia of chronic disease with a CK D. and diabetes - Will check anemia profile, stool occult test    Dyspnea: Multifactorial likely due to malignant hypertension, possibility of CHF/cardiomyopathy from malignant hypertension, uncontrolled diabetes, pneumonia    DVT prophylaxis:  Lovenox   CODE STATUS:  full code   Family Communication: Admission, patients condition and plan of care including tests being ordered have been discussed with the patient and  family member who indicates understanding and agree with the plan and Code Status   Further plan will depend as patient's clinical course evolves and further radiologic and laboratory data become available. Dr. Kevan Ny will assume care in a.m., left voicemail message  Time Spent on Admission: 1 hour   Wise Fees M.D. [facility] 07/10/2013, 3:54 PM Pager: [phone]  If 7PM-7AM, please contact night-coverage [email or web address] Password [email or web address]

## 2013-07-10 NOTE — ED Notes (Signed)
Complains that she cannot sleep and lay flat, that she needs to sit up to sleep.  States she feels like this when she is anemic

## 2013-07-10 NOTE — ED Provider Notes (Signed)
CSN: [id number]     Arrival date & time 07/10/13  1224 History   First MD Initiated Contact with Patient 07/10/13 1231     Chief Complaint  Patient presents with  . Shortness of Breath     (Consider location/radiation/quality/duration/timing/severity/associated sxs/prior Treatment) HPI 53 year old female presents with dyspnea over the past 2 days. It is most notable with lying flat. She also has had some low grade fevers and nasal congestion. No chest pain. No leg swelling. She has been drinking extra water and having urinary frequency. She realizes this is from her diabetes. Her PCP took her off metformin because she has had progressive CKD and changed her to insulin. She has been unable to afford this. Her BP meds were also changed so she has not been taking this either. The dyspnea she has now is most like when she had to have "an iron infusion" for anemia. No rectal bleeding or heavy periods (no current periods). She went to her PCP's office where they "took one look at me" and sent her to the ER. Per her, they were worried about her BP, which was "300/150" at the PCP's office.  Past Medical History  Diagnosis Date  . Hypertension   . Diabetes mellitus   . Anemia    Past Surgical History  Procedure Laterality Date  . Back surgery  17 years   History reviewed. No pertinent family history. History  Substance Use Topics  . Smoking status: Never Smoker   . Smokeless tobacco: Not on file  . Alcohol Use: No   OB History   Grav Para Term Preterm Abortions TAB SAB Ect Mult Living                 Review of Systems  Constitutional: Positive for fever.  HENT: Positive for congestion.   Respiratory: Positive for shortness of breath. Negative for cough.   Cardiovascular: Negative for chest pain and leg swelling.       +orthopnea  Gastrointestinal: Negative for nausea, vomiting and abdominal pain.  Genitourinary: Positive for frequency. Negative for dysuria.  Neurological: Negative  for weakness.  All other systems reviewed and are negative.      Allergies  Codeine  Home Medications   Current Outpatient Rx  Name  Route  Sig  Dispense  Refill  . atenolol (TENORMIN) 50 MG tablet   Oral   Take 50 mg by mouth 2 (two) times daily.         . famotidine (PEPCID AC) 10 MG chewable tablet   Oral   Chew 10-20 mg by mouth 2 (two) times daily as needed. For nausea or queasiness         . felodipine (PLENDIL) 5 MG 24 hr tablet   Oral   Take 5 mg by mouth at bedtime.         . ferrous gluconate (FERGON) 325 MG tablet   Oral   Take 325-650 mg by mouth 2 (two) times daily. 2 tablets in the morning and 1 tablet at night         . glyBURIDE (DIABETA) 5 MG tablet   Oral   Take 10 mg by mouth 2 (two) times daily with a meal.         . insulin glargine (LANTUS) 100 UNIT/ML injection   Subcutaneous   Inject 10-15 Units into the skin at bedtime as needed. If blood sugars up to 170-180          BP 210/118  Pulse 76  Temp(Src) 97.8 F (36.6 C) (Oral)  Resp 20  SpO2 98% Physical Exam  Nursing note and vitals reviewed. Constitutional: She is oriented to person, place, and time. She appears well-developed and well-nourished.  HENT:  Head: Normocephalic and atraumatic.  Right Ear: External ear normal.  Left Ear: External ear normal.  Nose: Nose normal.  Dry lips  Eyes: Right eye exhibits no discharge. Left eye exhibits no discharge.  Cardiovascular: Normal rate, regular rhythm and normal heart sounds.   No murmur heard. Pulmonary/Chest: Effort normal and breath sounds normal. She has no wheezes. She has no rales.  Abdominal: Soft. She exhibits no distension. There is no tenderness.  Musculoskeletal: She exhibits no edema.  Neurological: She is alert and oriented to person, place, and time.  Skin: Skin is warm and dry.    ED Course  Procedures (including critical care time) Labs Review Labs Reviewed  CBC WITH DIFFERENTIAL - Abnormal; Notable for  the following:    Hemoglobin 11.2 (*)    HCT 32.8 (*)    MCV 77.7 (*)    All other components within normal limits  COMPREHENSIVE METABOLIC PANEL - Abnormal; Notable for the following:    Sodium 131 (*)    Chloride 94 (*)    CO2 18 (*)    Glucose, Bld 481 (*)    BUN 65 (*)    Creatinine, Ser 5.33 (*)    Albumin 2.6 (*)    Alkaline Phosphatase 130 (*)    Total Bilirubin <0.2 (*)    GFR calc non Af Amer 8 (*)    GFR calc Af Amer 10 (*)    All other components within normal limits  URINALYSIS, ROUTINE W REFLEX MICROSCOPIC - Abnormal; Notable for the following:    Glucose, UA >1000 (*)    Hgb urine dipstick SMALL (*)    Protein, ur >300 (*)    All other components within normal limits  PRO B NATRIURETIC PEPTIDE - Abnormal; Notable for the following:    Pro B Natriuretic peptide (BNP) 15005.0 (*)    All other components within normal limits  URINE MICROSCOPIC-ADD ON - Abnormal; Notable for the following:    Squamous Epithelial / LPF MANY (*)    Bacteria, UA FEW (*)    All other components within normal limits  CBG MONITORING, ED - Abnormal; Notable for the following:    Glucose-Capillary 473 (*)    All other components within normal limits  I-STAT VENOUS BLOOD GAS, ED - Abnormal; Notable for the following:    pH, Ven 7.314 (*)    pCO2, Ven 40.7 (*)    pO2, Ven 29.0 (*)    Acid-base deficit 5.0 (*)    All other components within normal limits  CBG MONITORING, ED - Abnormal; Notable for the following:    Glucose-Capillary 436 (*)    All other components within normal limits  CBG MONITORING, ED - Abnormal; Notable for the following:    Glucose-Capillary 374 (*)    All other components within normal limits  CBG MONITORING, ED - Abnormal; Notable for the following:    Glucose-Capillary 187 (*)    All other components within normal limits  IMMUNOFIXATION ELECTROPHORESIS, URINE (WITH TOT PROT)  PROTEIN / CREATININE RATIO, URINE  CBC  IRON AND TIBC  FERRITIN  RENAL FUNCTION  PANEL  PROTEIN ELECTROPHORESIS, SERUM  PARATHYROID HORMONE, INTACT (NO CA)  VITAMIN D 25 HYDROXY  BASIC METABOLIC PANEL  BASIC METABOLIC PANEL  BASIC METABOLIC PANEL  BASIC METABOLIC PANEL  Rosezena Sensor, ED   Imaging Review Dg Chest 2 View  07/10/2013   CLINICAL DATA:  Orthopnea  EXAM: CHEST  2 VIEW  COMPARISON:  None.  FINDINGS: The lungs are adequately inflated. There is hazy increased density projecting over the lower thoracic spine which is likely on the left on the frontal film. This may reflect atelectasis or pneumonia. The cardiac silhouette is normal in size. The pulmonary vascularity is mildly prominent centrally. The mediastinum is normal in width. There is a trace of blunting of the costophrenic angles. The bony structures exhibit no acute abnormalities.  IMPRESSION: 1. Increased density projecting over the lower thoracic spine on the lateral film likely corresponds to density in the retrocardiac region on the frontal film. This is consistent with pneumonia. 2. One cannot exclude low-grade compensated CHF.   Electronically Signed   By: David  [place]   On: 07/10/2013 14:17   US Renal  07/10/2013   CLINICAL DATA:  Renal failure. Elevated creatinine and BUN. Hypertension and diabetes.  EXAM: RENAL/URINARY TRACT ULTRASOUND COMPLETE  COMPARISON:  09/06/2011  FINDINGS: Right Kidney:  Length: 8.5 cm. Slight volume loss with lobular cortex showing slightly increased echogenicity. No mass, cyst, stone or hydronephrosis.  Left Kidney:  Length: 8.8 cm. Slight volume loss with lobular cortex, slightly increased echogenicity. No mass, cyst, stone or hydronephrosis.  Bladder:  Normal with bilateral ureteral jets.  IMPRESSION: Kidneys are smaller than were seen in 2013. Slight cortical volume loss. No evidence of obstruction or acute pathology.   Electronically Signed   By: Paulina Fusi M.D.   On: 07/10/2013 18:45     EKG Interpretation   Date/Time:  Monday July 10 2013 12:51:09  EDT Ventricular Rate:  71 PR Interval:  164 QRS Duration: 70 QT Interval:  388 QTC Calculation: 421 R Axis:   -6 Text Interpretation:  Normal sinus rhythm Nonspecific ST and T wave  abnormality Abnormal ECG No significant change since last tracing  Confirmed by Caidon Foti  MD, Latarshia Jersey ([id number]) on 07/10/2013 7:18:05 PM      MDM   Final diagnoses:  DKA (diabetic ketoacidoses)  ARF (acute renal failure)    Patient's VSS here except hypertensive to 200 systolic. No acute chest pain or headache/neuro sx at this time. Has evidence of metabolic acidosis, likely DKA given her hyperglycemia. Also has possible CHF on CXR with elevated BNP. Will hold on fluids due to this, place on glucostabilizer, and admit to hospitalist, Dr. Isidoro Donning. Given her fever at home with concern for PNA on CXR, will treat as CAP.    Audree Camel, MD 07/10/13 [phone]

## 2013-07-10 NOTE — Consult Note (Signed)
Requesting Physician:  Dr. Isidoro Donningai Reason for Consult:  Chronic kidney disease HPI: The patient is a 53 y.o. year-old WF with a background of DM and HTN (both for 20+ years duration), asthma/seasonal allergies and disc problems with back surgery 20 years ago.  She was told a year ago by Dr. Kevan NyGates her primary care that she needed referral to nephrology for an elevated creatinine.  Her mother became ill with and subsequently died of cancer, pt herself was uninsured, and as a result never followed through with the referral.  She had been taken off metformin and placed on Lantus at that time, but could not afford it, so was taking only glyburide for the past year.  About 3 days ago started having symptoms that she thought were related to seasonal allergies/asthma, took pseudophed, developed some PND/orthopnea. Saw someone in Dr. Kevan NyGates office today who documented very elevated BP's and sent her to the ED. Her workup included a BP of 210/118, labs notable for BUN of 65, creatinine 5.33, K 5, BNP 15,000, Hb 11.2, Urinal;ysis with >300 mg% protein, 0-2 RBC's.  She is admitted for management of uncontrolled DM, HTN and CKD stage 5.  As for her CKD, it is apparent this has been longstanding since at least 2013, with creatinine trending as follows:  Creatinine, Ser  Date/Time Value Ref Range Status  07/10/2013  1:30 PM 5.33* 0.50 - 1.10 mg/dL Final  0/98/11911/31/2014 47:8212:58 PM 3.05* 0.50 - 1.10 mg/dL Final  9/56/21305/28/2013  8:655:45 AM 2.48* 0.50 - 1.10 mg/dL Final  7/84/69625/27/2013  9:525:37 AM 2.40* 0.50 - 1.10 mg/dL Final  8/41/32445/26/2013  0:105:54 PM 2.35* 0.50 - 1.10 mg/dL Final  2/72/53665/26/2013  4:408:49 AM 2.38* 0.50 - 1.10 mg/dL Final   Pt has prior use of NSAIDS's but none for a year. No stones No FH of renal disease    Past Medical History  Diagnosis Date  . Hypertension   . Diabetes mellitus   . Anemia     Past Surgical History:  Past Surgical History  Procedure Laterality Date  . Back surgery  17 years    Family History: History  reviewed. No pertinent family history. + DM, ovarian cancer, HTN (mother) Social History:  reports that she has never smoked. She does not have any smokeless tobacco history on file. She reports that she does not drink alcohol or use illicit drugs. 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 Not married, no children.  Has Master's in Counseling Allergies:  Allergies  Allergen Reactions  . Codeine Other (See Comments)    hallucinations    Home medications: Prior to Admission medications   Medication Sig Start Date End Date Taking? Authorizing Provider  acetaminophen (TYLENOL) 325 MG tablet Take 650 mg by mouth every 6 (six) hours as needed for fever.   Yes Historical Provider, MD  albuterol (PROVENTIL) (2.5 MG/3ML) 0.083% nebulizer solution Take 2.5 mg by nebulization every 6 (six) hours as needed for wheezing or shortness of breath.   Yes Historical Provider, MD  atenolol (TENORMIN) 50 MG tablet Take 50 mg by mouth 2 (two) times daily.   Yes Historical Provider, MD  felodipine (PLENDIL) 5 MG 24 hr tablet Take 5 mg by mouth at bedtime.   Yes Historical Provider, MD  ferrous gluconate (FERGON) 325 MG tablet Take 325-650 mg by mouth 2 (two) times daily. 2 tablets in the morning and 1 tablet at night   Yes Historical  Provider, MD  glyBURIDE (DIABETA) 5 MG tablet Take 10 mg by mouth 2 (two) times daily with a meal.   Yes Historical Provider, MD    Inpatient medications:    Review of Systems Gen:  Denies headache, fever, chills, sweats.  No weight loss. HEENT:  No visual change, sore throat, difficulty swallowing. Resp:  +PND, orthopnea, wheezing Cardiac:  + midsternal chest pressure esp with deep breathing GI:   Denies abdominal pain.  Loss of appetite for 2-3 days only GU:  Denies difficulty or change in voiding.  No change in urine color - described as "light" in color - no blood, tea or cola colored urine     MS:  Denies  joint pain or swelling.but has some chronic back and leg issues and leg cramps   Derm:  Denies skin rash or itching.  No chronic skin conditions.  Neuro:   Denies focal weakness, memory problems, hx stroke or TIA.   Psych:  Denies symptoms of depression of anxiety.  No hallucination.     Physical Exam:  Blood pressure 198/112, pulse 70, temperature 97.8 F (36.6 C), temperature source Oral, resp. rate 19, SpO2 98.00%.  Gen: Emotional and somewhat pale app WF Sitting upright, no oxygen, says "breathing fine as long as sitting up Skin: no rash, cyanosis Neck: no JVD, no bruits or LAN Chest: Crackles at right lung base Heart: Heart sounds normal  S4S1S2 no def S3  regular, no rub or gallop Abdomen: soft, no focal abd tenderness.  No abdominal bruits heard. Ext: 1+ pretibial pitting edema.. Distal pulses intact Neuro: alert, Ox3, no focal deficit Heme/Lymph: no bruising or LAN  Labs: Basic Metabolic Panel:  Recent Labs Lab 07/10/13 1330  NA 131*  K 5.0  CL 94*  CO2 18*  GLUCOSE 481*  BUN 65*  CREATININE 5.33*  CALCIUM 8.9    Recent Labs Lab 07/10/13 1330  AST 12  ALT 17  ALKPHOS 130*  BILITOT <0.2*  PROT 6.1  ALBUMIN 2.6*  CBC:  Recent Labs Lab 07/10/13 1330  WBC 5.5  NEUTROABS 4.1  HGB 11.2*  HCT 32.8*  MCV 77.7*  PLT 186   Recent Labs Lab 07/10/13 1243  GLUCAP 473*   Results for LILAC, HOFF (MRN [id number]) as of 07/10/2013 16:22  Ref. Range 07/10/2013 13:30  Color, Urine Latest Range: YELLOW  YELLOW  APPearance Latest Range: CLEAR  CLEAR  Specific Gravity, Urine Latest Range: 1.005-1.030  1.021  pH Latest Range: 5.0-8.0  6.5  Glucose Latest Range: NEGATIVE mg/dL >4098 (A)  Bilirubin Urine Latest Range: NEGATIVE  NEGATIVE  Ketones, ur Latest Range: NEGATIVE mg/dL NEGATIVE  Protein Latest Range: NEGATIVE mg/dL >119 (A)  Urobilinogen, UA Latest Range: 0.0-1.0 mg/dL 0.2  Nitrite Latest Range: NEGATIVE  NEGATIVE  Leukocytes, UA Latest Range:  NEGATIVE  NEGATIVE  Hgb urine dipstick Latest Range: NEGATIVE  SMALL (A)  WBC, UA Latest Range: <3 WBC/hpf 7-10  RBC / HPF Latest Range: <3 RBC/hpf 0-2  Squamous Epithelial / LPF Latest Range: RARE  MANY (A)  Bacteria, UA Latest Range: RARE  FEW (A)   Iron Studies: No results found for this basename: IRON, TIBC, TRANSFERRIN, FERRITIN,  in the last 168 hours  Xrays/Other Studies: Dg Chest 2 View  07/10/2013   CLINICAL DATA:  Orthopnea  EXAM: CHEST  2 VIEW  COMPARISON:  None.  FINDINGS: The lungs are adequately inflated. There is hazy increased density projecting over the lower thoracic spine which is likely on the  left on the frontal film. This may reflect atelectasis or pneumonia. The cardiac silhouette is normal in size. The pulmonary vascularity is mildly prominent centrally. The mediastinum is normal in width. There is a trace of blunting of the costophrenic angles. The bony structures exhibit no acute abnormalities.  IMPRESSION: 1. Increased density projecting over the lower thoracic spine on the lateral film likely corresponds to density in the retrocardiac region on the frontal film. This is consistent with pneumonia. 2. One cannot exclude low-grade compensated CHF.   Electronically Signed   By: David  [place]   On: 07/10/2013 14:17    Impression 53 yo WF with longstanding DM and HTN, both poorly controlled for at least a year, with CKD present as far back as 2013 with creatinine of 2.5 at that time, 3 in early 2014, and 5.3 now - likely representing progression of underlying disease and not likely reversible (likely to worsen as blood pressure is brought down)  Needs control of DM, lowering of BP (not to normal range all at once), CKD education, vein mapping, planning for AVF creation.  1. CKD5 - DM+HTN.  Check ultrasound. UPC. SPEP for completeness though low yield.  Screen for secondary hyperpara; save left arm; vein map; [facility] should see this admission. Explained to patient that there is  likely no reversibility. 2. Uncontrolled DM - per primary service. Pt has interstitial prominence on CXR and some lower ext edema so would not hydrate as part of TMT for her DM, but would probably not give diuretics quite yet either as she is not dramatically overloaded 3. Uncontrolled/accelereated HTN - Initial goal of 140/90 then ease down further; impaired autoregulation->will like have worsening of renal function as BP controlled; NO ACE or ARB with this advanced CKD 4. SOB - PNA +/-CHF; no overt pulm edema; needs echo to eval LV fx; high afterload from sig BP elevation likely contributing to her SOB 5. Anemia - only mild and better than expected for current GFR; check iron studies (prior history of Fe def) 6. Asthma/seasonal allergy - albuterol 7. Metabolic acidosis - gap of 19; urine ketones neg; renal failure in large part 8. Mild hyperkalemia - likely to improve with control of BS 9. CAP - atb's per primary service  Camille Bal,  MD [facility] [phone] pager 07/10/2013, 4:15 PM

## 2013-07-10 NOTE — ED Notes (Signed)
Spoke with Dr. Lowell GuitarPowell (nephrology) and he okayed for us to stick left hand only for labs.  Tourniquet placed to wrist and draw from hand.

## 2013-07-11 DIAGNOSIS — R0989 Other specified symptoms and signs involving the circulatory and respiratory systems: Secondary | ICD-10-CM

## 2013-07-11 DIAGNOSIS — N184 Chronic kidney disease, stage 4 (severe): Secondary | ICD-10-CM

## 2013-07-11 DIAGNOSIS — Z01810 Encounter for preprocedural cardiovascular examination: Secondary | ICD-10-CM

## 2013-07-11 DIAGNOSIS — I359 Nonrheumatic aortic valve disorder, unspecified: Secondary | ICD-10-CM

## 2013-07-11 LAB — FOLATE: Folate: 11.8 ng/mL

## 2013-07-11 LAB — BASIC METABOLIC PANEL
BUN: 64 mg/dL — ABNORMAL HIGH (ref 6–23)
CALCIUM: 8.8 mg/dL (ref 8.4–10.5)
CHLORIDE: 97 meq/L (ref 96–112)
CO2: 16 meq/L — AB (ref 19–32)
CREATININE: 5.35 mg/dL — AB (ref 0.50–1.10)
GFR calc Af Amer: 10 mL/min — ABNORMAL LOW (ref >90)
GFR calc non Af Amer: 8 mL/min — ABNORMAL LOW (ref >90)
GLUCOSE: 112 mg/dL — AB (ref 70–99)
Potassium: 4.4 mEq/L (ref 3.7–5.3)
Sodium: 132 mEq/L — ABNORMAL LOW (ref 137–147)

## 2013-07-11 LAB — TSH: TSH: 2.86 u[IU]/mL (ref 0.350–4.500)

## 2013-07-11 LAB — GLUCOSE, CAPILLARY
GLUCOSE-CAPILLARY: 157 mg/dL — AB (ref 70–99)
GLUCOSE-CAPILLARY: 177 mg/dL — AB (ref 70–99)
GLUCOSE-CAPILLARY: 197 mg/dL — AB (ref 70–99)
GLUCOSE-CAPILLARY: 79 mg/dL (ref 70–99)
GLUCOSE-CAPILLARY: 182 mg/dL — AB (ref 70–99)
GLUCOSE-CAPILLARY: 98 mg/dL (ref 70–99)
Glucose-Capillary: 206 mg/dL — ABNORMAL HIGH (ref 70–99)
Glucose-Capillary: 117 mg/dL — ABNORMAL HIGH (ref 70–99)
Glucose-Capillary: 161 mg/dL — ABNORMAL HIGH (ref 70–99)

## 2013-07-11 LAB — IRON AND TIBC
IRON: 14 ug/dL — AB (ref 42–135)
Saturation Ratios: 6 % — ABNORMAL LOW (ref 20–55)
TIBC: 231 ug/dL — ABNORMAL LOW (ref 250–470)
UIBC: 217 ug/dL (ref 125–400)

## 2013-07-11 LAB — CBC
HCT: 31.6 % — ABNORMAL LOW (ref 36.0–46.0)
Hemoglobin: 10.9 g/dL — ABNORMAL LOW (ref 12.0–15.0)
MCH: 26.7 pg (ref 26.0–34.0)
MCHC: 34.5 g/dL (ref 30.0–36.0)
MCV: 77.3 fL — ABNORMAL LOW (ref 78.0–100.0)
PLATELETS: 215 10*3/uL (ref 150–400)
RBC: 4.09 MIL/uL (ref 3.87–5.11)
RDW: 13.1 % (ref 11.5–15.5)
WBC: 5.9 10*3/uL (ref 4.0–10.5)

## 2013-07-11 LAB — MICROALBUMIN / CREATININE URINE RATIO
CREATININE, URINE: 83.9 mg/dL
MICROALB UR: 435.75 mg/dL — AB (ref 0.00–1.89)
Microalb Creat Ratio: 5193.7 mg/g — ABNORMAL HIGH (ref 0.0–30.0)

## 2013-07-11 LAB — NA AND K (SODIUM & POTASSIUM), RAND UR
Potassium Urine: 39 mEq/L
SODIUM UR: 41 meq/L

## 2013-07-11 LAB — RENAL FUNCTION PANEL
Albumin: 2.4 g/dL — ABNORMAL LOW (ref 3.5–5.2)
BUN: 64 mg/dL — AB (ref 6–23)
CO2: 17 mEq/L — ABNORMAL LOW (ref 19–32)
CREATININE: 5.44 mg/dL — AB (ref 0.50–1.10)
Calcium: 8.9 mg/dL (ref 8.4–10.5)
Chloride: 98 mEq/L (ref 96–112)
GFR calc Af Amer: 9 mL/min — ABNORMAL LOW (ref >90)
GFR calc non Af Amer: 8 mL/min — ABNORMAL LOW (ref >90)
GLUCOSE: 115 mg/dL — AB (ref 70–99)
PHOSPHORUS: 5 mg/dL — AB (ref 2.3–4.6)
POTASSIUM: 4.5 meq/L (ref 3.7–5.3)
Sodium: 133 mEq/L — ABNORMAL LOW (ref 137–147)

## 2013-07-11 LAB — VITAMIN D 25 HYDROXY (VIT D DEFICIENCY, FRACTURES): Vit D, 25-Hydroxy: 12 ng/mL — ABNORMAL LOW (ref 30–89)

## 2013-07-11 LAB — RETICULOCYTES
RBC.: 4.09 MIL/uL (ref 3.87–5.11)
RETIC CT PCT: 1.3 % (ref 0.4–3.1)
Retic Count, Absolute: 53.2 10*3/uL (ref 19.0–186.0)

## 2013-07-11 LAB — STREP PNEUMONIAE URINARY ANTIGEN: Strep Pneumo Urinary Antigen: NEGATIVE

## 2013-07-11 LAB — VITAMIN B12: Vitamin B-12: 925 pg/mL — ABNORMAL HIGH (ref 211–911)

## 2013-07-11 LAB — HEMOGLOBIN A1C
HEMOGLOBIN A1C: 14.5 % — AB (ref <5.7)
MEAN PLASMA GLUCOSE: 369 mg/dL — AB (ref <117)

## 2013-07-11 LAB — FERRITIN: Ferritin: 117 ng/mL (ref 10–291)

## 2013-07-11 LAB — MRSA PCR SCREENING: MRSA BY PCR: NEGATIVE

## 2013-07-11 MED ORDER — PROMETHAZINE HCL 25 MG PO TABS
25.0000 mg | ORAL_TABLET | Freq: Four times a day (QID) | ORAL | Status: DC | PRN
  Administered 2013-07-11 – 2013-07-21 (×8): 25 mg via ORAL
  Filled 2013-07-11 (×9): qty 1

## 2013-07-11 MED ORDER — FUROSEMIDE 80 MG PO TABS
80.0000 mg | ORAL_TABLET | Freq: Every day | ORAL | Status: DC
  Administered 2013-07-11: 80 mg via ORAL
  Filled 2013-07-11 (×2): qty 1

## 2013-07-11 MED ORDER — SODIUM CHLORIDE 0.9 % IV SOLN
510.0000 mg | Freq: Once | INTRAVENOUS | Status: AC
  Administered 2013-07-11: 510 mg via INTRAVENOUS
  Filled 2013-07-11 (×2): qty 17

## 2013-07-11 MED ORDER — HYDRALAZINE HCL 50 MG PO TABS
50.0000 mg | ORAL_TABLET | Freq: Three times a day (TID) | ORAL | Status: DC
  Administered 2013-07-11 – 2013-07-12 (×2): 50 mg via ORAL
  Filled 2013-07-11 (×5): qty 1

## 2013-07-11 MED ORDER — WHITE PETROLATUM GEL
Status: AC
  Filled 2013-07-11: qty 5

## 2013-07-11 NOTE — Progress Notes (Signed)
VASCULAR LAB PRELIMINARY  PRELIMINARY  PRELIMINARY  PRELIMINARY   Right  Upper Extremity Vein Map    Cephalic  Segment Diameter Depth Comment  1. Axilla 4.8147mm mm   2. Mid upper arm 3.4585mm mm   3. Above AC 3.4185mm mm branch  4. In AC 5.225mm mm   5. Below AC 2.6972mm mm   6. Mid forearm 2.6113mm mm   7. Wrist 2.9206mm mm    mm mm    mm mm    mm mm      Left Upper Extremity Vein Map    Cephalic  Segment Diameter Depth Comment  1. Axilla 1.5173mm mm   2. Mid upper arm 1.4694mm mm   3. Above AC 2.5606mm mm   4. In AC 3.504mm mm   5. Below AC 2.3906mm mm   6. Mid forearm 1.2367mm mm branch  7. Wrist mm mm    mm mm    mm mm    mm mm    Basilic  Segment Diameter Depth Comment  1. Axilla mm mm   2. Mid upper arm 3.6485mm 13.648mm   3. Above [facility] 2.9153mm 6.5651mm   4. In [facility] 2.3466mm 7.5212mm   5. Below AC 1.3466mm mm   6. Mid forearm mm mm   7. Wrist mm mm    mm mm    mm mm    mm mm     Farrel DemarkJill Eunice, RDMS, RVT  07/11/2013, 11:18 AM

## 2013-07-11 NOTE — Progress Notes (Signed)
Subjective: [name] is a pleasant but unfortunate 53 year old female who presented to our office yesterday with malignant hypertension and acute renal failure.  She is a history of diabetes.  She unfortunately has been unable to be compliant with medications and office visits secondary to not having had health insurance.  She presented to our office yesterday with shortness of breath and orthopnea as well as headache and blood pressure was over 937 systolic and greater than 169 diastolic.  She was transferred to the emergency room for admission.  Objective: Weight change:   Intake/Output Summary (Last 24 hours) at 07/11/13 0803 Last data filed at 07/11/13 0500  Gross per 24 hour  Intake    160 ml  Output      1 ml  Net    159 ml   Filed Vitals:   07/10/13 2340 07/11/13 0049 07/11/13 0339 07/11/13 0700  BP: 136/62 161/76 172/73   Pulse: 67 70 67   Temp:  98.1 F (36.7 C) 97.7 F (36.5 C) 98.5 F (36.9 C)  TempSrc:  Oral Axillary Oral  Resp: 17 22 19    Height:  5' 1"  (1.549 m)    Weight:  186 lb 8.2 oz (84.6 kg)    SpO2: 97% 98% 98%     General Appearance: Alert with some mild confusion.  Nausea some intermittent vomiting Lungs: Clear to auscultation bilaterally, respirations currently unlabored Heart: Regular rate and rhythm, S4 gallop Abdomen: Soft, non-tender, bowel sounds normal Extremities: Extremities normal, atraumatic, no cyanosis or edema Neuro: Alert and oriented but anxious.  Some word finding difficulties at times.  Otherwise nonfocal  Lab Results: Results for orders placed during the hospital encounter of 07/10/13 (from the past 48 hour(s))  CBG MONITORING, ED     Status: Abnormal   Collection Time    07/10/13 12:43 PM      Result Value Ref Range   Glucose-Capillary 473 (*) 70 - 99 mg/dL  CBC WITH DIFFERENTIAL     Status: Abnormal   Collection Time    07/10/13  1:30 PM      Result Value Ref Range   WBC 5.5  4.0 - 10.5 K/uL   RBC 4.22  3.87 - 5.11 MIL/uL    Hemoglobin 11.2 (*) 12.0 - 15.0 g/dL   HCT 32.8 (*) 36.0 - 46.0 %   MCV 77.7 (*) 78.0 - 100.0 fL   MCH 26.5  26.0 - 34.0 pg   MCHC 34.1  30.0 - 36.0 g/dL   RDW 13.2  11.5 - 15.5 %   Platelets 186  150 - 400 K/uL   Neutrophils Relative % 74  43 - 77 %   Neutro Abs 4.1  1.7 - 7.7 K/uL   Lymphocytes Relative 18  12 - 46 %   Lymphs Abs 1.0  0.7 - 4.0 K/uL   Monocytes Relative 7  3 - 12 %   Monocytes Absolute 0.4  0.1 - 1.0 K/uL   Eosinophils Relative 1  0 - 5 %   Eosinophils Absolute 0.1  0.0 - 0.7 K/uL   Basophils Relative 0  0 - 1 %   Basophils Absolute 0.0  0.0 - 0.1 K/uL  COMPREHENSIVE METABOLIC PANEL     Status: Abnormal   Collection Time    07/10/13  1:30 PM      Result Value Ref Range   Sodium 131 (*) 137 - 147 mEq/L   Potassium 5.0  3.7 - 5.3 mEq/L   Chloride 94 (*) 96 -  112 mEq/L   CO2 18 (*) 19 - 32 mEq/L   Glucose, Bld 481 (*) 70 - 99 mg/dL   BUN 65 (*) 6 - 23 mg/dL   Creatinine, Ser 5.33 (*) 0.50 - 1.10 mg/dL   Calcium 8.9  8.4 - 10.5 mg/dL   Total Protein 6.1  6.0 - 8.3 g/dL   Albumin 2.6 (*) 3.5 - 5.2 g/dL   AST 12  0 - 37 U/L   ALT 17  0 - 35 U/L   Alkaline Phosphatase 130 (*) 39 - 117 U/L   Total Bilirubin <0.2 (*) 0.3 - 1.2 mg/dL   GFR calc non Af Amer 8 (*) >90 mL/min   GFR calc Af Amer 10 (*) >90 mL/min   Comment: (NOTE)     The eGFR has been calculated using the CKD EPI equation.     This calculation has not been validated in all clinical situations.     eGFR's persistently <90 mL/min signify possible Chronic Kidney     Disease.  URINALYSIS, ROUTINE W REFLEX MICROSCOPIC     Status: Abnormal   Collection Time    07/10/13  1:30 PM      Result Value Ref Range   Color, Urine YELLOW  YELLOW   APPearance CLEAR  CLEAR   Specific Gravity, Urine 1.021  1.005 - 1.030   pH 6.5  5.0 - 8.0   Glucose, UA >1000 (*) NEGATIVE mg/dL   Hgb urine dipstick SMALL (*) NEGATIVE   Bilirubin Urine NEGATIVE  NEGATIVE   Ketones, ur NEGATIVE  NEGATIVE mg/dL   Protein, ur  >300 (*) NEGATIVE mg/dL   Urobilinogen, UA 0.2  0.0 - 1.0 mg/dL   Nitrite NEGATIVE  NEGATIVE   Leukocytes, UA NEGATIVE  NEGATIVE  PRO B NATRIURETIC PEPTIDE     Status: Abnormal   Collection Time    07/10/13  1:30 PM      Result Value Ref Range   Pro B Natriuretic peptide (BNP) 15005.0 (*) 0 - 125 pg/mL  URINE MICROSCOPIC-ADD ON     Status: Abnormal   Collection Time    07/10/13  1:30 PM      Result Value Ref Range   Squamous Epithelial / LPF MANY (*) RARE   WBC, UA 7-10  <3 WBC/hpf   RBC / HPF 0-2  <3 RBC/hpf   Bacteria, UA FEW (*) RARE  PROTEIN / CREATININE RATIO, URINE     Status: Abnormal   Collection Time    07/10/13  1:30 PM      Result Value Ref Range   Creatinine, Urine 40.53     Total Protein, Urine 598.1     Comment: NO NORMAL RANGE ESTABLISHED FOR THIS TEST     RESULT CONFIRMED BY AUTOMATED DILUTION   PROTEIN CREATININE RATIO 14.76 (*) 0.00 - 0.15  I-STAT TROPOININ, ED     Status: None   Collection Time    07/10/13  1:48 PM      Result Value Ref Range   Troponin i, poc 0.02  0.00 - 0.08 ng/mL   Comment 3            Comment: Due to the release kinetics of cTnI,     a negative result within the first hours     of the onset of symptoms does not rule out     myocardial infarction with certainty.     If myocardial infarction is still suspected,     repeat the test at appropriate  intervals.  I-STAT VENOUS BLOOD GAS, ED     Status: Abnormal   Collection Time    07/10/13  2:09 PM      Result Value Ref Range   pH, Ven 7.314 (*) 7.250 - 7.300   pCO2, Ven 40.7 (*) 45.0 - 50.0 mmHg   pO2, Ven 29.0 (*) 30.0 - 45.0 mmHg   Bicarbonate 20.7  20.0 - 24.0 mEq/L   TCO2 22  0 - 100 mmol/L   O2 Saturation 49.0     Acid-base deficit 5.0 (*) 0.0 - 2.0 mmol/L   Sample type VENOUS     Comment NOTIFIED PHYSICIAN    CBG MONITORING, ED     Status: Abnormal   Collection Time    07/10/13  4:20 PM      Result Value Ref Range   Glucose-Capillary 436 (*) 70 - 99 mg/dL  CBG  MONITORING, ED     Status: Abnormal   Collection Time    07/10/13  5:43 PM      Result Value Ref Range   Glucose-Capillary 374 (*) 70 - 99 mg/dL  CBG MONITORING, ED     Status: Abnormal   Collection Time    07/10/13  7:11 PM      Result Value Ref Range   Glucose-Capillary 187 (*) 70 - 99 mg/dL   Comment 1 Notify RN    BASIC METABOLIC PANEL     Status: Abnormal   Collection Time    07/10/13  7:13 PM      Result Value Ref Range   Sodium 133 (*) 137 - 147 mEq/L   Potassium 4.4  3.7 - 5.3 mEq/L   Chloride 98  96 - 112 mEq/L   CO2 17 (*) 19 - 32 mEq/L   Glucose, Bld 128 (*) 70 - 99 mg/dL   BUN 63 (*) 6 - 23 mg/dL   Creatinine, Ser 5.26 (*) 0.50 - 1.10 mg/dL   Calcium 9.0  8.4 - 10.5 mg/dL   GFR calc non Af Amer 8 (*) >90 mL/min   GFR calc Af Amer 10 (*) >90 mL/min   Comment: (NOTE)     The eGFR has been calculated using the CKD EPI equation.     This calculation has not been validated in all clinical situations.     eGFR's persistently <90 mL/min signify possible Chronic Kidney     Disease.  CBG MONITORING, ED     Status: None   Collection Time    07/10/13  8:40 PM      Result Value Ref Range   Glucose-Capillary 91  70 - 99 mg/dL  BASIC METABOLIC PANEL     Status: Abnormal   Collection Time    07/10/13  9:13 PM      Result Value Ref Range   Sodium 134 (*) 137 - 147 mEq/L   Potassium 4.5  3.7 - 5.3 mEq/L   Chloride 97  96 - 112 mEq/L   CO2 17 (*) 19 - 32 mEq/L   Glucose, Bld 141 (*) 70 - 99 mg/dL   BUN 61 (*) 6 - 23 mg/dL   Creatinine, Ser 5.31 (*) 0.50 - 1.10 mg/dL   Calcium 8.6  8.4 - 10.5 mg/dL   GFR calc non Af Amer 8 (*) >90 mL/min   GFR calc Af Amer 10 (*) >90 mL/min   Comment: (NOTE)     The eGFR has been calculated using the CKD EPI equation.     This calculation has  not been validated in all clinical situations.     eGFR's persistently <90 mL/min signify possible Chronic Kidney     Disease.  GLUCOSE, CAPILLARY     Status: Abnormal   Collection Time     07/10/13 11:04 PM      Result Value Ref Range   Glucose-Capillary 163 (*) 70 - 99 mg/dL   Comment 1 Notify RN     Comment 2 Documented in Chart    GLUCOSE, CAPILLARY     Status: Abnormal   Collection Time    07/11/13 12:48 AM      Result Value Ref Range   Glucose-Capillary 206 (*) 70 - 99 mg/dL  GLUCOSE, CAPILLARY     Status: Abnormal   Collection Time    07/11/13  3:34 AM      Result Value Ref Range   Glucose-Capillary 117 (*) 70 - 99 mg/dL  CBC     Status: Abnormal   Collection Time    07/11/13  3:40 AM      Result Value Ref Range   WBC 5.9  4.0 - 10.5 K/uL   RBC 4.09  3.87 - 5.11 MIL/uL   Hemoglobin 10.9 (*) 12.0 - 15.0 g/dL   HCT 31.6 (*) 36.0 - 46.0 %   MCV 77.3 (*) 78.0 - 100.0 fL   MCH 26.7  26.0 - 34.0 pg   MCHC 34.5  30.0 - 36.0 g/dL   RDW 13.1  11.5 - 15.5 %   Platelets 215  150 - 400 K/uL  RENAL FUNCTION PANEL     Status: Abnormal   Collection Time    07/11/13  3:40 AM      Result Value Ref Range   Sodium 133 (*) 137 - 147 mEq/L   Potassium 4.5  3.7 - 5.3 mEq/L   Chloride 98  96 - 112 mEq/L   CO2 17 (*) 19 - 32 mEq/L   Glucose, Bld 115 (*) 70 - 99 mg/dL   BUN 64 (*) 6 - 23 mg/dL   Creatinine, Ser 5.44 (*) 0.50 - 1.10 mg/dL   Calcium 8.9  8.4 - 10.5 mg/dL   Phosphorus 5.0 (*) 2.3 - 4.6 mg/dL   Albumin 2.4 (*) 3.5 - 5.2 g/dL   GFR calc non Af Amer 8 (*) >90 mL/min   GFR calc Af Amer 9 (*) >90 mL/min   Comment: (NOTE)     The eGFR has been calculated using the CKD EPI equation.     This calculation has not been validated in all clinical situations.     eGFR's persistently <90 mL/min signify possible Chronic Kidney     Disease.  BASIC METABOLIC PANEL     Status: Abnormal   Collection Time    07/11/13  3:40 AM      Result Value Ref Range   Sodium 132 (*) 137 - 147 mEq/L   Potassium 4.4  3.7 - 5.3 mEq/L   Chloride 97  96 - 112 mEq/L   CO2 16 (*) 19 - 32 mEq/L   Glucose, Bld 112 (*) 70 - 99 mg/dL   BUN 64 (*) 6 - 23 mg/dL   Creatinine, Ser 5.35 (*) 0.50  - 1.10 mg/dL   Calcium 8.8  8.4 - 10.5 mg/dL   GFR calc non Af Amer 8 (*) >90 mL/min   GFR calc Af Amer 10 (*) >90 mL/min   Comment: (NOTE)     The eGFR has been calculated using the CKD EPI equation.  This calculation has not been validated in all clinical situations.     eGFR's persistently <90 mL/min signify possible Chronic Kidney     Disease.  RETICULOCYTES     Status: None   Collection Time    07/11/13  3:40 AM      Result Value Ref Range   Retic Ct Pct 1.3  0.4 - 3.1 %   RBC. 4.09  3.87 - 5.11 MIL/uL   Retic Count, Manual 53.2  19.0 - 186.0 K/uL  TSH     Status: None   Collection Time    07/11/13  3:40 AM      Result Value Ref Range   TSH 2.860  0.350 - 4.500 uIU/mL   Comment: Please note change in reference range.  MRSA PCR SCREENING     Status: None   Collection Time    07/11/13  4:12 AM      Result Value Ref Range   MRSA by PCR NEGATIVE  NEGATIVE   Comment:            The GeneXpert MRSA Assay (FDA     approved for NASAL specimens     only), is one component of a     comprehensive MRSA colonization     surveillance program. It is not     intended to diagnose MRSA     infection nor to guide or     monitor treatment for     MRSA infections.    Studies/Results: Dg Chest 2 View  07/10/2013   CLINICAL DATA:  Orthopnea  EXAM: CHEST  2 VIEW  COMPARISON:  None.  FINDINGS: The lungs are adequately inflated. There is hazy increased density projecting over the lower thoracic spine which is likely on the left on the frontal film. This may reflect atelectasis or pneumonia. The cardiac silhouette is normal in size. The pulmonary vascularity is mildly prominent centrally. The mediastinum is normal in width. There is a trace of blunting of the costophrenic angles. The bony structures exhibit no acute abnormalities.  IMPRESSION: 1. Increased density projecting over the lower thoracic spine on the lateral film likely corresponds to density in the retrocardiac region on the frontal  film. This is consistent with pneumonia. 2. One cannot exclude low-grade compensated CHF.   Electronically Signed   By: David  Martinique   On: 07/10/2013 14:17   US Renal  07/10/2013   CLINICAL DATA:  Renal failure. Elevated creatinine and BUN. Hypertension and diabetes.  EXAM: RENAL/URINARY TRACT ULTRASOUND COMPLETE  COMPARISON:  09/06/2011  FINDINGS: Right Kidney:  Length: 8.5 cm. Slight volume loss with lobular cortex showing slightly increased echogenicity. No mass, cyst, stone or hydronephrosis.  Left Kidney:  Length: 8.8 cm. Slight volume loss with lobular cortex, slightly increased echogenicity. No mass, cyst, stone or hydronephrosis.  Bladder:  Normal with bilateral ureteral jets.  IMPRESSION: Kidneys are smaller than were seen in 2013. Slight cortical volume loss. No evidence of obstruction or acute pathology.   Electronically Signed   By: Nelson Chimes M.D.   On: 07/10/2013 18:45   Medications: Scheduled Meds: . atenolol  50 mg Oral BID  . azithromycin  500 mg Intravenous Q24H  . cefTRIAXone (ROCEPHIN)  IV  1 g Intravenous Q24H  . enoxaparin (LOVENOX) injection  30 mg Subcutaneous Q24H  . hydrALAZINE      . hydrALAZINE  25 mg Oral BID  . insulin aspart  0-9 Units Subcutaneous 6 times per day  . insulin glargine  5  Units Subcutaneous QHS  . white petrolatum       Continuous Infusions: . sodium chloride     PRN Meds:.acetaminophen, acetaminophen, dextrose, hydrALAZINE, HYDROcodone-acetaminophen, HYDROmorphone (DILAUDID) injection, ondansetron (ZOFRAN) IV, ondansetron, promethazine  Assessment/Plan: Principal Problem:   Acute renal failure - as per nephrology.  Renal ultrasound pending. Active Problems:   Hyperkalemia - resolved   DKA (diabetic ketoacidoses) - resolved, use sliding scale insulin for now   Malignant hypertension - Persisting.  Continue current medications   Chronic kidney disease, stage IV (severe) - As per nephrology   Noncompliance - Would likely improve greatly  with Medicaid provided to the patient   Anemia - Likely multifactorial but primarily secondary to renal disease   Dyspnea - Multifactorial, and likely contributed to by retrocardiac pneumonia - continue azithromycin and Rocephin for now   Retrocardiac infiltrate consistent with probable pneumonia As above   Nausea and vomiting - quite possibly secondary to elevated blood pressure.  No focal symptoms.  Control blood pressure and treat                                         with Zofran as needed.  No evidence of bowel obstruction on exam    LOS: 1 day   Henrine Screws, MD 07/11/2013, 8:04 AM

## 2013-07-11 NOTE — Progress Notes (Signed)
Pt hasn't voided since she arrived from the ed @ 2330 , denies needing to void bladder scanned pt which showed 60-100 cc's of urine will continue to monitor

## 2013-07-11 NOTE — Progress Notes (Signed)
  Echocardiogram 2D Echocardiogram has been performed.  [name], [name] 07/11/2013, 10:01 AM

## 2013-07-11 NOTE — Progress Notes (Signed)
Inpatient Diabetes Program Recommendations  AACE/ADA: New Consensus Statement on Inpatient Glycemic Control (2013)  Target Ranges:  Prepandial:   less than 140 mg/dL      Peak postprandial:   less than 180 mg/dL (1-2 hours)      Critically ill patients:  140 - 180 mg/dL   Reason for Visit: Poorly controlled diabetes  Diabetes history: Type 2 diabetes-   Outpatient Diabetes medications: On glyburide 10 mg bid.  Supposed to be taking Lantus as well, but could not afford Current orders for Inpatient glycemic control: Lantus 5 units at HS, Novolog correction scale every 4 hours  Note:  Visited patient at bedside.  Gave her information regarding a generic meter available at [facility] for about $16 with test strips $9 for 50 test strips.  Also gave her information regarding generic insulins available at [facility] for $24.88/vial-- Novolin/ReliOn Human Regular, 70/30, and NPH.  Perhaps patient could be prescribed low, split-dose NPH at discharge while she has no insurance.    Unfortunately, patient should qualify for Medicaid if she goes on dialysis and her meter, insulins, etc would be covered.  If dialysis to be started within a month, could be given a voucher for a month's supply of Lantus SoloStar pens with the possibility of getting Medicaid to cover after that.  Thank you.  Auriah Hollings S. Elsie Lincolnouth, RN, CNS, CDE Inpatient Diabetes Program, team pager [phone]

## 2013-07-11 NOTE — Progress Notes (Addendum)
Bilateral carotid artery duplex completed.  Right:  40-59% ICA stenosis.  Left:  1-39% ICA stenosis.  Bilateral:  Vertebral artery flow is antegrade.     

## 2013-07-11 NOTE — Progress Notes (Signed)
Utilization review completed.  

## 2013-07-11 NOTE — Progress Notes (Signed)
[facility] ROUNDING NOTE  Subjective: didn't sleep much. Mild nausea Not SOB right now. Bummed out about her situation but says "1 day at a time:"  Objective Vital signs in last 24 hours: Filed Vitals:   07/11/13 0049 07/11/13 0339 07/11/13 0700 07/11/13 0736  BP: 161/76 172/73  205/77  Pulse: 70 67  71  Temp: 98.1 F (36.7 C) 97.7 F (36.5 C) 98.5 F (36.9 C)   TempSrc: Oral Axillary Oral   Resp: 22 19  15   Height: 5\' 1"  (1.549 m)     Weight: 84.6 kg (186 lb 8.2 oz)     SpO2: 98% 98%  96%   Weight change:   Intake/Output Summary (Last 24 hours) at 07/11/13 1120 Last data filed at 07/11/13 1000  Gross per 24 hour  Intake    400 ml  Output    301 ml  Net     99 ml   Physical Exam:  Blood pressure 205/77, pulse 71, temperature 98.5 F (36.9 C), temperature source Oral, resp. rate 15, height 5\' 1"  (1.549 m), weight 84.6 kg (186 lb 8.2 oz), SpO2 96.00%.    Labs: Basic Metabolic Panel:  Recent Labs Lab 07/10/13 1330 07/10/13 1913 07/10/13 2113 07/11/13 0340  NA 131* 133* 134* 133*  132*  K 5.0 4.4 4.5 4.5  4.4  CL 94* 98 97 98  97  CO2 18* 17* 17* 17*  16*  GLUCOSE 481* 128* 141* 115*  112*  BUN 65* 63* 61* 64*  64*  CREATININE 5.33* 5.26* 5.31* 5.44*  5.35*  CALCIUM 8.9 9.0 8.6 8.9  8.8  PHOS  --   --   --  5.0*   PTH pending  Recent Labs Lab 07/10/13 1330 07/11/13 0340  AST 12  --   ALT 17  --   ALKPHOS 130*  --   BILITOT <0.2*  --   PROT 6.1  --   ALBUMIN 2.6* 2.4*    Recent Labs Lab 07/10/13 1330 07/11/13 0340  WBC 5.5 5.9  NEUTROABS 4.1  --   HGB 11.2* 10.9*  HCT 32.8* 31.6*  MCV 77.7* 77.3*  PLT 186 215   CBG:  Recent Labs Lab 07/10/13 1911 07/10/13 2040 07/10/13 2304 07/11/13 0048 07/11/13 0334  GLUCAP 187* 91 163* 206* 117*    Iron Studies:  Recent Labs Lab 07/11/13 0340  IRON 14*  TIBC 231*  FERRITIN 117   Studies/Results: Dg Chest 2 View  07/10/2013   CLINICAL DATA:  Orthopnea  EXAM:  CHEST  2 VIEW  COMPARISON:  None.  FINDINGS: The lungs are adequately inflated. There is hazy increased density projecting over the lower thoracic spine which is likely on the left on the frontal film. This may reflect atelectasis or pneumonia. The cardiac silhouette is normal in size. The pulmonary vascularity is mildly prominent centrally. The mediastinum is normal in width. There is a trace of blunting of the costophrenic angles. The bony structures exhibit no acute abnormalities.  IMPRESSION: 1. Increased density projecting over the lower thoracic spine on the lateral film likely corresponds to density in the retrocardiac region on the frontal film. This is consistent with pneumonia. 2. One cannot exclude low-grade compensated CHF.   Electronically Signed   By: David  [place]   On: 07/10/2013 14:17   US Renal  07/10/2013   CLINICAL DATA:  Renal failure. Elevated creatinine and BUN. Hypertension and diabetes.  EXAM: RENAL/URINARY TRACT ULTRASOUND COMPLETE  COMPARISON:  09/06/2011  FINDINGS: Right  Kidney:  Length: 8.5 cm. Slight volume loss with lobular cortex showing slightly increased echogenicity. No mass, cyst, stone or hydronephrosis.  Left Kidney:  Length: 8.8 cm. Slight volume loss with lobular cortex, slightly increased echogenicity. No mass, cyst, stone or hydronephrosis.  Bladder:  Normal with bilateral ureteral jets.  IMPRESSION: Kidneys are smaller than were seen in 2013. Slight cortical volume loss. No evidence of obstruction or acute pathology.   Electronically Signed   By: Paulina FusiMark  Shogry M.D.   On: 07/10/2013 18:45   Medications: . sodium chloride     . atenolol  50 mg Oral BID  . azithromycin  500 mg Intravenous Q24H  . cefTRIAXone (ROCEPHIN)  IV  1 g Intravenous Q24H  . enoxaparin (LOVENOX) injection  30 mg Subcutaneous Q24H  . hydrALAZINE  25 mg Oral BID  . insulin aspart  0-9 Units Subcutaneous 6 times per day  . insulin glargine  5 Units Subcutaneous QHS  . white petrolatum          Impression  53 yo WF with longstanding DM and HTN, both poorly controlled for at least a year, with CKD present as far back as 2013 with creatinine of 2.5 at that time, 3 in early 2014, and 5.3 now - likely representing progression of underlying disease and not likely reversible (likely to worsen as blood pressure is brought down) Ultrasound reveals bilaterally small kidneys. UPC 14 grams proteinuria.  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   1. CKD5 - DM+HTN. US bilat small kidneys.  UPC 14 gms prot. SPEP pending.  Screen for secondary hyperpara (PTH pending); saving left arm; vein mapping ordered; [facility] should see this admission - I have consulted. Explained to patient that there is likely no reversibility. Dialysis education (no indications at present) 2. Uncontrolled DM - per primary service.  3. Uncontrolled/accelerated HTN - Initial goal of 140/90 then ease down further; impaired autoregulation->will like have worsening of renal function as BP controlled; NO ACE or ARB with this advanced CKD; increase hydralazine to 50 TID; initial goal 140/90; could add amlodipine if not goal; hesitate to increase BB with her asthma history;  Add furosemide 80 mg/day 4. SOB - PNA +/-CHF; no overt pulm edema; needs echo to eval LV fx; high afterload from sig BP elevation likely contributing to her SOB 5. Anemia - only mild and better than expected for current GFR; iron deficient by studies; Feraheme X 1 6. Asthma/seasonal allergy - albuterol 7. Metabolic acidosis - gap of 19; urine ketones neg; renal failure in large part 8. Mild hyperkalemia - likely to improve with control of BS 9. CAP - atb's per primary service   Camille Balynthia Abbe Bula, MD [facility] [phone] pager 07/11/2013, 11:20 AM

## 2013-07-11 NOTE — Consult Note (Addendum)
[facility] Consult  Reason for Consult:  In need of permanent HD access Referring Physician:  Eliott Nine MRN #:  [id number]  History of Present Illness: This is a 53 y.o. female who present to the IM office yesterday with SOB, orthopnea, and HA. She was found to have  malignant HTN, and acute renal failure.  She has not been compliant with her medications due to lack of insurance coverage.  She does have a hx of diabetes and is on diabeta for this at home.  She states that she was on Metformin in the past, but was taken off of this when her renal function started to decline.  She has never smoked or drank.   Past Medical History  Diagnosis Date  . Hypertension   . Diabetes mellitus   . Anemia    Past Surgical History  Procedure Laterality Date  . Back surgery  17 years   Allergies  Allergen Reactions  . Codeine Other (See Comments)    hallucinations   Prior to Admission medications   Medication Sig Start Date End Date Taking? Authorizing Provider  acetaminophen (TYLENOL) 325 MG tablet Take 650 mg by mouth every 6 (six) hours as needed for fever.   Yes Historical Provider, MD  albuterol (PROVENTIL) (2.5 MG/3ML) 0.083% nebulizer solution Take 2.5 mg by nebulization every 6 (six) hours as needed for wheezing or shortness of breath.   Yes Historical Provider, MD  atenolol (TENORMIN) 50 MG tablet Take 50 mg by mouth 2 (two) times daily.   Yes Historical Provider, MD  felodipine (PLENDIL) 5 MG 24 hr tablet Take 5 mg by mouth at bedtime.   Yes Historical Provider, MD  ferrous gluconate (FERGON) 325 MG tablet Take 325-650 mg by mouth 2 (two) times daily. 2 tablets in the morning and 1 tablet at night   Yes Historical Provider, MD  glyBURIDE (DIABETA) 5 MG tablet Take 10 mg by mouth 2 (two) times daily with a meal.   Yes Historical Provider, MD   History   Social History  . Marital Status: Single    Spouse Name: N/A    Number of Children: N/A  . Years of  Education: N/A   Occupational History  . Not on file.   Social History Main Topics  . Smoking status: Never Smoker   . Smokeless tobacco: Not on file  . Alcohol Use: No  . Drug Use: No  . Sexual Activity: No   Other Topics Concern  . Not on file   Social History Narrative  . No narrative on file   Family Hx: Mother died age 57  hx Stage 4 Ovarian Cancer  Father living age 17 with Parkinson's  ROS: [x]  Positive   [ ]  Negative   [ ]  All sytems reviewed and are negative  Cardiovascular: []  chest pain/pressure []  palpitations [x]  SOB lying flat [x]  DOE []  pain in legs while walking []  pain in legs at rest []  pain in legs at night []  non-healing ulcers []  hx of DVT []  swelling in legs  Pulmonary: []  productive cough []  asthma/wheezing []  home O2 [x]  seasonal asthma  Neurologic: []  weakness in []  arms []  legs []  numbness in []  arms []  legs []  hx of CVA []  mini stroke [] difficulty speaking or slurred speech []  temporary loss of vision in one eye []  dizziness [x]  headache  Hematologic: []  hx of cancer []  bleeding problems []  problems with blood clotting easily  Endocrine:   [x]  diabetes []   thyroid disease  GI []  vomiting blood []  blood in stool  GU: [x]  CKD/renal failure []  HD--[]  M/W/F or []  T/T/S []  burning with urination []  blood in urine  Psychiatric: []  anxiety []  depression  Musculoskeletal: []  arthritis []  joint pain  Integumentary: []  rashes []  ulcers  Constitutional: []  fever []  chills [x]  fatigue [x]  poor appetite  Physical Examination  Filed Vitals:   07/11/13 1143  BP: 195/82  Pulse: 67  Temp: 97.6 F (36.4 C)  Resp: 16   Body mass index is 35.26 kg/(m^2).  General:  WDWN in NAD Gait: Not observed HENT: WNL, normocephalic Eyes: Pupils equal Pulmonary: normal non-labored breathing, without Rales, rhonchi,  wheezing Cardiac: regular, without  Murmurs, rubs or gallops; with bilateral right > left carotid  bruits Abdomen: soft, NT/ND, no masses Skin: without rashes, without ulcers  Vascular Exam/Pulses:  Right Left  Radial 2+ (normal) 2+ (normal)  Ulnar 1+ (weak) 1+ (weak)  Femoral 2+ (normal) Cannot palpate  Popliteal 1+ (weak) Cannot palpate   DP 2+ (normal) trace  PT Cannot palpate Cannot palpate   Extremities: without ischemic changes, without Gangrene , without cellulitis; without open wounds;  Musculoskeletal: no muscle wasting or atrophy  Neurologic: A&O X 3; Appropriate Affect ; SENSATION: normal; MOTOR FUNCTION:  moving all extremities equally. Speech is fluent/normal Psychiatric:  Normal affect; judgement intact. Lymph:  No inguinal lymphadenopathy  CBC    Component Value Date/Time   WBC 5.9 07/11/2013 0340   RBC 4.09 07/11/2013 0340   RBC 4.09 07/11/2013 0340   HGB 10.9* 07/11/2013 0340   HCT 31.6* 07/11/2013 0340   PLT 215 07/11/2013 0340   MCV 77.3* 07/11/2013 0340   MCH 26.7 07/11/2013 0340   MCHC 34.5 07/11/2013 0340   RDW 13.1 07/11/2013 0340   LYMPHSABS 1.0 07/10/2013 1330   MONOABS 0.4 07/10/2013 1330   EOSABS 0.1 07/10/2013 1330   BASOSABS 0.0 07/10/2013 1330   BMET    Component Value Date/Time   NA 133* 07/11/2013 0340   NA 132* 07/11/2013 0340   K 4.5 07/11/2013 0340   K 4.4 07/11/2013 0340   CL 98 07/11/2013 0340   CL 97 07/11/2013 0340   CO2 17* 07/11/2013 0340   CO2 16* 07/11/2013 0340   GLUCOSE 115* 07/11/2013 0340   GLUCOSE 112* 07/11/2013 0340   BUN 64* 07/11/2013 0340   BUN 64* 07/11/2013 0340   CREATININE 5.44* 07/11/2013 0340   CREATININE 5.35* 07/11/2013 0340   CALCIUM 8.9 07/11/2013 0340   CALCIUM 8.8 07/11/2013 0340   GFRNONAA 8* 07/11/2013 0340   GFRNONAA 8* 07/11/2013 0340   GFRAA 9* 07/11/2013 0340   GFRAA 10* 07/11/2013 0340   Non-Invasive Vascular Imaging:    Right Cephalic: Cephalic  Segment  Diameter  Depth  Comment   1. Axilla  4.55mm  mm    2. Mid upper arm  3.58mm  mm    3. Above AC  3.35mm  mm  branch   4. In AC  5.52mm  mm    5. Below AC   2.71mm  mm    6. Mid forearm  2.55mm  mm    7. Wrist  2.4mm  mm     Left arm: Cephalic  Segment  Diameter  Depth  Comment   1. Axilla  1.13mm  mm    2. Mid upper arm  1.39mm  mm    3. Above AC  2.42mm  mm    4. In [facility]  3.51mm  mm    5. Below AC  2.3006mm  mm    6. Mid forearm  1.5567mm  mm  branch   7. Wrist  mm  mm     mm  mm     mm  mm     mm  mm    Basilic  Segment  Diameter  Depth  Comment   1. Axilla  mm  mm    2. Mid upper arm  3.8585mm  13.848mm    3. [facility]  2.7853mm  6.3451mm    4. In [facility]  2.966mm  7.7112mm    5. Below AC  1.2366mm  mm    6. Mid forearm  mm  mm    7. Wrist  mm  mm     Statin:  no Beta Blocker:  no Aspirin:  no ACEI:  no ARB:  no Other antiplatelets/anticoagulants:  no  ASSESSMENT: This is a 53 y.o. female with CKD 5 and is in need of permanent HD access  PLAN: -pt is right handed, but right cephalic looks to be the best conduit.  Dr. Edilia Boickson to see pt this afternoon and will decide best procedure for pt. -she does have bilateral carotid bruits with right > left.  Will order carotid duplex.  Doreatha MassedSamantha Rhyne, PA-C [facility] [phone]  Agree with above. It looks like her best chance for an AVF is a right brachial-cephalic AVF. I will have them move her IV to the left arm.  Carotid duplex is pending (has carotid bruits). We will not be able to get onto the schedule until early next week. If she goes home before then, we can arrange it as an outpatient.  Waverly Ferrarihristopher Nikitta Sobiech, MD, FACS [facility] [phone] 07/11/2013

## 2013-07-12 ENCOUNTER — Inpatient Hospital Stay (HOSPITAL_COMMUNITY): Payer: Medicaid Other

## 2013-07-12 DIAGNOSIS — N184 Chronic kidney disease, stage 4 (severe): Secondary | ICD-10-CM

## 2013-07-12 LAB — CBC WITH DIFFERENTIAL/PLATELET
BASOS ABS: 0 10*3/uL (ref 0.0–0.1)
BASOS PCT: 0 % (ref 0–1)
Eosinophils Absolute: 0.1 10*3/uL (ref 0.0–0.7)
Eosinophils Relative: 2 % (ref 0–5)
HCT: 31.6 % — ABNORMAL LOW (ref 36.0–46.0)
HEMOGLOBIN: 10.8 g/dL — AB (ref 12.0–15.0)
Lymphocytes Relative: 31 % (ref 12–46)
Lymphs Abs: 1.8 10*3/uL (ref 0.7–4.0)
MCH: 26.9 pg (ref 26.0–34.0)
MCHC: 34.2 g/dL (ref 30.0–36.0)
MCV: 78.6 fL (ref 78.0–100.0)
MONOS PCT: 8 % (ref 3–12)
Monocytes Absolute: 0.5 10*3/uL (ref 0.1–1.0)
NEUTROS PCT: 59 % (ref 43–77)
Neutro Abs: 3.4 10*3/uL (ref 1.7–7.7)
Platelets: 228 10*3/uL (ref 150–400)
RBC: 4.02 MIL/uL (ref 3.87–5.11)
RDW: 13.4 % (ref 11.5–15.5)
WBC: 5.8 10*3/uL (ref 4.0–10.5)

## 2013-07-12 LAB — LEGIONELLA ANTIGEN, URINE: Legionella Antigen, Urine: NEGATIVE

## 2013-07-12 LAB — RENAL FUNCTION PANEL
Albumin: 2.5 g/dL — ABNORMAL LOW (ref 3.5–5.2)
BUN: 65 mg/dL — ABNORMAL HIGH (ref 6–23)
CO2: 17 mEq/L — ABNORMAL LOW (ref 19–32)
Calcium: 9.1 mg/dL (ref 8.4–10.5)
Chloride: 96 mEq/L (ref 96–112)
Creatinine, Ser: 5.79 mg/dL — ABNORMAL HIGH (ref 0.50–1.10)
GFR calc non Af Amer: 8 mL/min — ABNORMAL LOW (ref >90)
GFR, EST AFRICAN AMERICAN: 9 mL/min — AB (ref >90)
Glucose, Bld: 83 mg/dL (ref 70–99)
PHOSPHORUS: 6 mg/dL — AB (ref 2.3–4.6)
Potassium: 4.4 mEq/L (ref 3.7–5.3)
SODIUM: 132 meq/L — AB (ref 137–147)

## 2013-07-12 LAB — URINE CULTURE
COLONY COUNT: NO GROWTH
CULTURE: NO GROWTH

## 2013-07-12 LAB — LIPID PANEL
Cholesterol: 252 mg/dL — ABNORMAL HIGH (ref 0–200)
HDL: 38 mg/dL — AB (ref >39)
LDL Cholesterol: 160 mg/dL — ABNORMAL HIGH (ref 0–99)
TRIGLYCERIDES: 271 mg/dL — AB (ref <150)
Total CHOL/HDL Ratio: 6.6 RATIO
VLDL: 54 mg/dL — ABNORMAL HIGH (ref 0–40)

## 2013-07-12 LAB — PROTEIN, URINE, 24 HOUR
Collection Interval-UPROT: 750 hours
Protein, 24H Urine: 144 mg/d — ABNORMAL HIGH (ref 50–100)
Protein, Urine: 598 mg/dL
URINE TOTAL VOLUME-UPROT: 750 mL

## 2013-07-12 LAB — HEMOGLOBIN A1C
Hgb A1c MFr Bld: 14.2 % — ABNORMAL HIGH (ref <5.7)
MEAN PLASMA GLUCOSE: 361 mg/dL — AB (ref <117)

## 2013-07-12 LAB — GLUCOSE, CAPILLARY
GLUCOSE-CAPILLARY: 111 mg/dL — AB (ref 70–99)
GLUCOSE-CAPILLARY: 101 mg/dL — AB (ref 70–99)
GLUCOSE-CAPILLARY: 75 mg/dL (ref 70–99)
GLUCOSE-CAPILLARY: 191 mg/dL — AB (ref 70–99)
Glucose-Capillary: 115 mg/dL — ABNORMAL HIGH (ref 70–99)
Glucose-Capillary: 77 mg/dL (ref 70–99)

## 2013-07-12 MED ORDER — FUROSEMIDE 10 MG/ML IJ SOLN
120.0000 mg | Freq: Once | INTRAVENOUS | Status: AC
  Administered 2013-07-12: 120 mg via INTRAVENOUS
  Filled 2013-07-12: qty 12

## 2013-07-12 MED ORDER — AMLODIPINE BESYLATE 10 MG PO TABS
10.0000 mg | ORAL_TABLET | Freq: Every day | ORAL | Status: DC
  Administered 2013-07-12 – 2013-07-16 (×5): 10 mg via ORAL
  Filled 2013-07-12 (×6): qty 1

## 2013-07-12 MED ORDER — VITAMIN D (ERGOCALCIFEROL) 1.25 MG (50000 UNIT) PO CAPS
50000.0000 [IU] | ORAL_CAPSULE | ORAL | Status: DC
  Administered 2013-07-13: 50000 [IU] via ORAL
  Filled 2013-07-12 (×2): qty 1

## 2013-07-12 MED ORDER — PANTOPRAZOLE SODIUM 40 MG PO TBEC
40.0000 mg | DELAYED_RELEASE_TABLET | Freq: Every day | ORAL | Status: DC
  Administered 2013-07-12 – 2013-07-25 (×12): 40 mg via ORAL
  Filled 2013-07-12 (×13): qty 1

## 2013-07-12 MED ORDER — CLONIDINE HCL 0.1 MG PO TABS
0.1000 mg | ORAL_TABLET | Freq: Two times a day (BID) | ORAL | Status: DC
  Administered 2013-07-12 – 2013-07-16 (×8): 0.1 mg via ORAL
  Filled 2013-07-12 (×11): qty 1

## 2013-07-12 NOTE — Progress Notes (Addendum)
Subjective: Patient actually fell well last night but this morning after medications has developed nausea and vomiting.  She was tolerating clear liquids well yesterday.  She does feel orthopneic when she's lying flat.  She is +1400 cc from yesterday.  No chest pains.  Speaking well and moving all extremities well.  She does not have abdominal pain.  CBGs are okay.  Renal function is slightly worse today  Objective: Weight change:   Intake/Output Summary (Last 24 hours) at 07/12/13 0726 Last data filed at 07/12/13 0200  Gross per 24 hour  Intake   1907 ml  Output    520 ml  Net   1387 ml   Filed Vitals:   07/11/13 2107 07/12/13 0000 07/12/13 0353 07/12/13 0646  BP: 171/83 166/76 193/79 158/72  Pulse: 63 65    Temp:  98.5 F (36.9 C) 98.1 F (36.7 C)   TempSrc:  Oral Oral   Resp: 17 20    Height:      Weight:      SpO2:  97%      General Appearance: Alert, nauseous, conversive Lungs: A few rales in bases Heart: Regular rate and rhythm, S1 and S2 normal, no murmur, rub or gallop Abdomen: Soft, non-tender, bowel sounds active all four quadrants, no masses, no organomegaly Extremities: Extremities normal, atraumatic, no cyanosis or edema Neuro: Oriented x3 nonfocal  Lab Results: Results for orders placed during the hospital encounter of 07/10/13 (from the past 48 hour(s))  CBG MONITORING, ED     Status: Abnormal   Collection Time    07/10/13 12:43 PM      Result Value Ref Range   Glucose-Capillary 473 (*) 70 - 99 mg/dL  CBC WITH DIFFERENTIAL     Status: Abnormal   Collection Time    07/10/13  1:30 PM      Result Value Ref Range   WBC 5.5  4.0 - 10.5 K/uL   RBC 4.22  3.87 - 5.11 MIL/uL   Hemoglobin 11.2 (*) 12.0 - 15.0 g/dL   HCT 32.8 (*) 36.0 - 46.0 %   MCV 77.7 (*) 78.0 - 100.0 fL   MCH 26.5  26.0 - 34.0 pg   MCHC 34.1  30.0 - 36.0 g/dL   RDW 13.2  11.5 - 15.5 %   Platelets 186  150 - 400 K/uL   Neutrophils Relative % 74  43 - 77 %   Neutro Abs 4.1  1.7 - 7.7 K/uL    Lymphocytes Relative 18  12 - 46 %   Lymphs Abs 1.0  0.7 - 4.0 K/uL   Monocytes Relative 7  3 - 12 %   Monocytes Absolute 0.4  0.1 - 1.0 K/uL   Eosinophils Relative 1  0 - 5 %   Eosinophils Absolute 0.1  0.0 - 0.7 K/uL   Basophils Relative 0  0 - 1 %   Basophils Absolute 0.0  0.0 - 0.1 K/uL  COMPREHENSIVE METABOLIC PANEL     Status: Abnormal   Collection Time    07/10/13  1:30 PM      Result Value Ref Range   Sodium 131 (*) 137 - 147 mEq/L   Potassium 5.0  3.7 - 5.3 mEq/L   Chloride 94 (*) 96 - 112 mEq/L   CO2 18 (*) 19 - 32 mEq/L   Glucose, Bld 481 (*) 70 - 99 mg/dL   BUN 65 (*) 6 - 23 mg/dL   Creatinine, Ser 5.33 (*) 0.50 - 1.10 mg/dL  Calcium 8.9  8.4 - 10.5 mg/dL   Total Protein 6.1  6.0 - 8.3 g/dL   Albumin 2.6 (*) 3.5 - 5.2 g/dL   AST 12  0 - 37 U/L   ALT 17  0 - 35 U/L   Alkaline Phosphatase 130 (*) 39 - 117 U/L   Total Bilirubin <0.2 (*) 0.3 - 1.2 mg/dL   GFR calc non Af Amer 8 (*) >90 mL/min   GFR calc Af Amer 10 (*) >90 mL/min   Comment: (NOTE)     The eGFR has been calculated using the CKD EPI equation.     This calculation has not been validated in all clinical situations.     eGFR's persistently <90 mL/min signify possible Chronic Kidney     Disease.  URINALYSIS, ROUTINE W REFLEX MICROSCOPIC     Status: Abnormal   Collection Time    07/10/13  1:30 PM      Result Value Ref Range   Color, Urine YELLOW  YELLOW   APPearance CLEAR  CLEAR   Specific Gravity, Urine 1.021  1.005 - 1.030   pH 6.5  5.0 - 8.0   Glucose, UA >1000 (*) NEGATIVE mg/dL   Hgb urine dipstick SMALL (*) NEGATIVE   Bilirubin Urine NEGATIVE  NEGATIVE   Ketones, ur NEGATIVE  NEGATIVE mg/dL   Protein, ur >300 (*) NEGATIVE mg/dL   Urobilinogen, UA 0.2  0.0 - 1.0 mg/dL   Nitrite NEGATIVE  NEGATIVE   Leukocytes, UA NEGATIVE  NEGATIVE  PRO B NATRIURETIC PEPTIDE     Status: Abnormal   Collection Time    07/10/13  1:30 PM      Result Value Ref Range   Pro B Natriuretic peptide (BNP) 15005.0  (*) 0 - 125 pg/mL  URINE MICROSCOPIC-ADD ON     Status: Abnormal   Collection Time    07/10/13  1:30 PM      Result Value Ref Range   Squamous Epithelial / LPF MANY (*) RARE   WBC, UA 7-10  <3 WBC/hpf   RBC / HPF 0-2  <3 RBC/hpf   Bacteria, UA FEW (*) RARE  PROTEIN / CREATININE RATIO, URINE     Status: Abnormal   Collection Time    07/10/13  1:30 PM      Result Value Ref Range   Creatinine, Urine 40.53     Total Protein, Urine 598.1     Comment: NO NORMAL RANGE ESTABLISHED FOR THIS TEST     RESULT CONFIRMED BY AUTOMATED DILUTION   PROTEIN CREATININE RATIO 14.76 (*) 0.00 - 0.15  I-STAT TROPOININ, ED     Status: None   Collection Time    07/10/13  1:48 PM      Result Value Ref Range   Troponin i, poc 0.02  0.00 - 0.08 ng/mL   Comment 3            Comment: Due to the release kinetics of cTnI,     a negative result within the first hours     of the onset of symptoms does not rule out     myocardial infarction with certainty.     If myocardial infarction is still suspected,     repeat the test at appropriate intervals.  I-STAT VENOUS BLOOD GAS, ED     Status: Abnormal   Collection Time    07/10/13  2:09 PM      Result Value Ref Range   pH, Ven 7.314 (*) 7.250 - 7.300  pCO2, Ven 40.7 (*) 45.0 - 50.0 mmHg   pO2, Ven 29.0 (*) 30.0 - 45.0 mmHg   Bicarbonate 20.7  20.0 - 24.0 mEq/L   TCO2 22  0 - 100 mmol/L   O2 Saturation 49.0     Acid-base deficit 5.0 (*) 0.0 - 2.0 mmol/L   Sample type VENOUS     Comment NOTIFIED PHYSICIAN    CBG MONITORING, ED     Status: Abnormal   Collection Time    07/10/13  4:20 PM      Result Value Ref Range   Glucose-Capillary 436 (*) 70 - 99 mg/dL  CBG MONITORING, ED     Status: Abnormal   Collection Time    07/10/13  5:43 PM      Result Value Ref Range   Glucose-Capillary 374 (*) 70 - 99 mg/dL  CBG MONITORING, ED     Status: Abnormal   Collection Time    07/10/13  7:11 PM      Result Value Ref Range   Glucose-Capillary 187 (*) 70 - 99 mg/dL    Comment 1 Notify RN    BASIC METABOLIC PANEL     Status: Abnormal   Collection Time    07/10/13  7:13 PM      Result Value Ref Range   Sodium 133 (*) 137 - 147 mEq/L   Potassium 4.4  3.7 - 5.3 mEq/L   Chloride 98  96 - 112 mEq/L   CO2 17 (*) 19 - 32 mEq/L   Glucose, Bld 128 (*) 70 - 99 mg/dL   BUN 63 (*) 6 - 23 mg/dL   Creatinine, Ser 5.26 (*) 0.50 - 1.10 mg/dL   Calcium 9.0  8.4 - 10.5 mg/dL   GFR calc non Af Amer 8 (*) >90 mL/min   GFR calc Af Amer 10 (*) >90 mL/min   Comment: (NOTE)     The eGFR has been calculated using the CKD EPI equation.     This calculation has not been validated in all clinical situations.     eGFR's persistently <90 mL/min signify possible Chronic Kidney     Disease.  CBG MONITORING, ED     Status: None   Collection Time    07/10/13  8:40 PM      Result Value Ref Range   Glucose-Capillary 91  70 - 99 mg/dL  BASIC METABOLIC PANEL     Status: Abnormal   Collection Time    07/10/13  9:13 PM      Result Value Ref Range   Sodium 134 (*) 137 - 147 mEq/L   Potassium 4.5  3.7 - 5.3 mEq/L   Chloride 97  96 - 112 mEq/L   CO2 17 (*) 19 - 32 mEq/L   Glucose, Bld 141 (*) 70 - 99 mg/dL   BUN 61 (*) 6 - 23 mg/dL   Creatinine, Ser 5.31 (*) 0.50 - 1.10 mg/dL   Calcium 8.6  8.4 - 10.5 mg/dL   GFR calc non Af Amer 8 (*) >90 mL/min   GFR calc Af Amer 10 (*) >90 mL/min   Comment: (NOTE)     The eGFR has been calculated using the CKD EPI equation.     This calculation has not been validated in all clinical situations.     eGFR's persistently <90 mL/min signify possible Chronic Kidney     Disease.  GLUCOSE, CAPILLARY     Status: Abnormal   Collection Time    07/10/13 11:04 PM  Result Value Ref Range   Glucose-Capillary 163 (*) 70 - 99 mg/dL   Comment 1 Notify RN     Comment 2 Documented in Chart    GLUCOSE, CAPILLARY     Status: Abnormal   Collection Time    07/11/13 12:48 AM      Result Value Ref Range   Glucose-Capillary 206 (*) 70 - 99 mg/dL   GLUCOSE, CAPILLARY     Status: Abnormal   Collection Time    07/11/13  3:34 AM      Result Value Ref Range   Glucose-Capillary 117 (*) 70 - 99 mg/dL  CBC     Status: Abnormal   Collection Time    07/11/13  3:40 AM      Result Value Ref Range   WBC 5.9  4.0 - 10.5 K/uL   RBC 4.09  3.87 - 5.11 MIL/uL   Hemoglobin 10.9 (*) 12.0 - 15.0 g/dL   HCT 31.6 (*) 36.0 - 46.0 %   MCV 77.3 (*) 78.0 - 100.0 fL   MCH 26.7  26.0 - 34.0 pg   MCHC 34.5  30.0 - 36.0 g/dL   RDW 13.1  11.5 - 15.5 %   Platelets 215  150 - 400 K/uL  IRON AND TIBC     Status: Abnormal   Collection Time    07/11/13  3:40 AM      Result Value Ref Range   Iron 14 (*) 42 - 135 ug/dL   TIBC 231 (*) 250 - 470 ug/dL   Saturation Ratios 6 (*) 20 - 55 %   UIBC 217  125 - 400 ug/dL   Comment: Performed at [place]     Status: None   Collection Time    07/11/13  3:40 AM      Result Value Ref Range   Ferritin 117  10 - 291 ng/mL   Comment: Performed at [facility]  RENAL FUNCTION PANEL     Status: Abnormal   Collection Time    07/11/13  3:40 AM      Result Value Ref Range   Sodium 133 (*) 137 - 147 mEq/L   Potassium 4.5  3.7 - 5.3 mEq/L   Chloride 98  96 - 112 mEq/L   CO2 17 (*) 19 - 32 mEq/L   Glucose, Bld 115 (*) 70 - 99 mg/dL   BUN 64 (*) 6 - 23 mg/dL   Creatinine, Ser 5.44 (*) 0.50 - 1.10 mg/dL   Calcium 8.9  8.4 - 10.5 mg/dL   Phosphorus 5.0 (*) 2.3 - 4.6 mg/dL   Albumin 2.4 (*) 3.5 - 5.2 g/dL   GFR calc non Af Amer 8 (*) >90 mL/min   GFR calc Af Amer 9 (*) >90 mL/min   Comment: (NOTE)     The eGFR has been calculated using the CKD EPI equation.     This calculation has not been validated in all clinical situations.     eGFR's persistently <90 mL/min signify possible Chronic Kidney     Disease.  VITAMIN D 25 HYDROXY     Status: Abnormal   Collection Time    07/11/13  3:40 AM      Result Value Ref Range   Vit D, 25-Hydroxy 12 (*) 30 - 89 ng/mL   Comment: (NOTE)     This  assay accurately quantifies Vitamin D, which is the sum of the     25-Hydroxy forms of Vitamin D2 and  D3.  Studies have shown that the     optimum concentration of 25-Hydroxy Vitamin D is 30 ng/mL or higher.      Concentrations of Vitamin D between 20 and 29 ng/mL are considered to     be insufficient and concentrations less than 20 ng/mL are considered     to be deficient for Vitamin D.     Performed at [place]     Status: Abnormal   Collection Time    07/11/13  3:40 AM      Result Value Ref Range   Sodium 132 (*) 137 - 147 mEq/L   Potassium 4.4  3.7 - 5.3 mEq/L   Chloride 97  96 - 112 mEq/L   CO2 16 (*) 19 - 32 mEq/L   Glucose, Bld 112 (*) 70 - 99 mg/dL   BUN 64 (*) 6 - 23 mg/dL   Creatinine, Ser 5.35 (*) 0.50 - 1.10 mg/dL   Calcium 8.8  8.4 - 10.5 mg/dL   GFR calc non Af Amer 8 (*) >90 mL/min   GFR calc Af Amer 10 (*) >90 mL/min   Comment: (NOTE)     The eGFR has been calculated using the CKD EPI equation.     This calculation has not been validated in all clinical situations.     eGFR's persistently <90 mL/min signify possible Chronic Kidney     Disease.  HEMOGLOBIN A1C     Status: Abnormal   Collection Time    07/11/13  3:40 AM      Result Value Ref Range   Hemoglobin A1C 14.5 (*) <5.7 %   Comment: (NOTE)                                                                               According to the ADA Clinical Practice Recommendations for 2011, when     HbA1c is used as a screening test:      >=6.5%   Diagnostic of Diabetes Mellitus               (if abnormal result is confirmed)     5.7-6.4%   Increased risk of developing Diabetes Mellitus     References:Diagnosis and Classification of Diabetes Mellitus,Diabetes     OINO,6767,20(NOBSJ 1):S62-S69 and Standards of Medical Care in             Diabetes - 2011,Diabetes Care,2011,34 (Suppl 1):S11-S61.   Mean Plasma Glucose 369 (*) <117 mg/dL   Comment: Performed at [place]     Status: Abnormal   Collection Time    07/11/13  3:40 AM      Result Value Ref Range   Vitamin B-12 925 (*) 211 - 911 pg/mL   Comment: Performed at [place]     Status: None   Collection Time    07/11/13  3:40 AM      Result Value Ref Range   Folate 11.8     Comment: (NOTE)     Reference Ranges            Deficient:       0.4 - 3.3  ng/mL            Indeterminate:   3.4 - 5.4 ng/mL            Normal:              > 5.4 ng/mL     Performed at [place]     Status: None   Collection Time    07/11/13  3:40 AM      Result Value Ref Range   Retic Ct Pct 1.3  0.4 - 3.1 %   RBC. 4.09  3.87 - 5.11 MIL/uL   Retic Count, Manual 53.2  19.0 - 186.0 K/uL  TSH     Status: None   Collection Time    07/11/13  3:40 AM      Result Value Ref Range   TSH 2.860  0.350 - 4.500 uIU/mL   Comment: Please note change in reference range.  MRSA PCR SCREENING     Status: None   Collection Time    07/11/13  4:12 AM      Result Value Ref Range   MRSA by PCR NEGATIVE  NEGATIVE   Comment:            The GeneXpert MRSA Assay (FDA     approved for NASAL specimens     only), is one component of a     comprehensive MRSA colonization     surveillance program. It is not     intended to diagnose MRSA     infection nor to guide or     monitor treatment for     MRSA infections.  GLUCOSE, CAPILLARY     Status: Abnormal   Collection Time    07/11/13  8:23 AM      Result Value Ref Range   Glucose-Capillary 157 (*) 70 - 99 mg/dL  GLUCOSE, CAPILLARY     Status: Abnormal   Collection Time    07/11/13 11:07 AM      Result Value Ref Range   Glucose-Capillary 177 (*) 70 - 99 mg/dL  STREP PNEUMONIAE URINARY ANTIGEN     Status: None   Collection Time    07/11/13  2:43 PM      Result Value Ref Range   Strep Pneumo Urinary Antigen NEGATIVE  NEGATIVE   Comment:            Infection due to S. pneumoniae     cannot be absolutely ruled out     since  the antigen present     may be below the detection limit     of the test.  MICROALBUMIN / CREATININE URINE RATIO     Status: Abnormal   Collection Time    07/11/13  2:43 PM      Result Value Ref Range   Microalb, Ur 435.75 (*) 0.00 - 1.89 mg/dL   Comment: (NOTE)     Result repeated and verified.     Result confirmed by automatic dilution.   Creatinine, Urine 83.9     Microalb Creat Ratio 5193.7 (*) 0.0 - 30.0 mg/g   Comment: Performed at [facility]  NA AND K (SODIUM & POTASSIUM), RAND UR     Status: None   Collection Time    07/11/13  2:43 PM      Result Value Ref Range   Sodium, Ur 41     Potassium Urine Timed 39    GLUCOSE, CAPILLARY     Status: Abnormal  Collection Time    07/11/13  4:12 PM      Result Value Ref Range   Glucose-Capillary 197 (*) 70 - 99 mg/dL  GLUCOSE, CAPILLARY     Status: None   Collection Time    07/11/13  8:25 PM      Result Value Ref Range   Glucose-Capillary 79  70 - 99 mg/dL   Comment 1 Notify RN     Comment 2 Documented in Chart    GLUCOSE, CAPILLARY     Status: Abnormal   Collection Time    07/11/13  8:41 PM      Result Value Ref Range   Glucose-Capillary 182 (*) 70 - 99 mg/dL   Comment 1 Notify RN     Comment 2 Documented in Chart    GLUCOSE, CAPILLARY     Status: None   Collection Time    07/11/13  9:58 PM      Result Value Ref Range   Glucose-Capillary 98  70 - 99 mg/dL   Comment 1 Notify RN     Comment 2 Documented in Chart    GLUCOSE, CAPILLARY     Status: Abnormal   Collection Time    07/11/13 10:05 PM      Result Value Ref Range   Glucose-Capillary 161 (*) 70 - 99 mg/dL   Comment 1 Notify RN     Comment 2 Documented in Chart    GLUCOSE, CAPILLARY     Status: Abnormal   Collection Time    07/11/13 11:56 PM      Result Value Ref Range   Glucose-Capillary 115 (*) 70 - 99 mg/dL   Comment 1 Notify RN     Comment 2 Documented in Chart    CBC WITH DIFFERENTIAL     Status: Abnormal   Collection Time    07/12/13  2:45  AM      Result Value Ref Range   WBC 5.8  4.0 - 10.5 K/uL   RBC 4.02  3.87 - 5.11 MIL/uL   Hemoglobin 10.8 (*) 12.0 - 15.0 g/dL   HCT 31.6 (*) 36.0 - 46.0 %   MCV 78.6  78.0 - 100.0 fL   MCH 26.9  26.0 - 34.0 pg   MCHC 34.2  30.0 - 36.0 g/dL   RDW 13.4  11.5 - 15.5 %   Platelets 228  150 - 400 K/uL   Neutrophils Relative % 59  43 - 77 %   Neutro Abs 3.4  1.7 - 7.7 K/uL   Lymphocytes Relative 31  12 - 46 %   Lymphs Abs 1.8  0.7 - 4.0 K/uL   Monocytes Relative 8  3 - 12 %   Monocytes Absolute 0.5  0.1 - 1.0 K/uL   Eosinophils Relative 2  0 - 5 %   Eosinophils Absolute 0.1  0.0 - 0.7 K/uL   Basophils Relative 0  0 - 1 %   Basophils Absolute 0.0  0.0 - 0.1 K/uL  LIPID PANEL     Status: Abnormal   Collection Time    07/12/13  2:45 AM      Result Value Ref Range   Cholesterol 252 (*) 0 - 200 mg/dL   Triglycerides 271 (*) <150 mg/dL   HDL 38 (*) >39 mg/dL   Total CHOL/HDL Ratio 6.6     VLDL 54 (*) 0 - 40 mg/dL   LDL Cholesterol 160 (*) 0 - 99 mg/dL   Comment:  Total Cholesterol/HDL:CHD Risk     Coronary Heart Disease Risk Table                         Men   Women      1/2 Average Risk   3.4   3.3      Average Risk       5.0   4.4      2 X Average Risk   9.6   7.1      3 X Average Risk  23.4   11.0                Use the calculated Patient Ratio     above and the CHD Risk Table     to determine the patient's CHD Risk.                ATP III CLASSIFICATION (LDL):      <100     mg/dL   Optimal      100-129  mg/dL   Near or Above                        Optimal      130-159  mg/dL   Borderline      160-189  mg/dL   High      >190     mg/dL   Very High  RENAL FUNCTION PANEL     Status: Abnormal   Collection Time    07/12/13  2:45 AM      Result Value Ref Range   Sodium 132 (*) 137 - 147 mEq/L   Potassium 4.4  3.7 - 5.3 mEq/L   Chloride 96  96 - 112 mEq/L   CO2 17 (*) 19 - 32 mEq/L   Glucose, Bld 83  70 - 99 mg/dL   BUN 65 (*) 6 - 23 mg/dL   Creatinine, Ser  5.79 (*) 0.50 - 1.10 mg/dL   Calcium 9.1  8.4 - 10.5 mg/dL   Phosphorus 6.0 (*) 2.3 - 4.6 mg/dL   Albumin 2.5 (*) 3.5 - 5.2 g/dL   GFR calc non Af Amer 8 (*) >90 mL/min   GFR calc Af Amer 9 (*) >90 mL/min   Comment: (NOTE)     The eGFR has been calculated using the CKD EPI equation.     This calculation has not been validated in all clinical situations.     eGFR's persistently <90 mL/min signify possible Chronic Kidney     Disease.  GLUCOSE, CAPILLARY     Status: None   Collection Time    07/12/13  3:45 AM      Result Value Ref Range   Glucose-Capillary 77  70 - 99 mg/dL    Studies/Results: Dg Chest 2 View  07/10/2013   CLINICAL DATA:  Orthopnea  EXAM: CHEST  2 VIEW  COMPARISON:  None.  FINDINGS: The lungs are adequately inflated. There is hazy increased density projecting over the lower thoracic spine which is likely on the left on the frontal film. This may reflect atelectasis or pneumonia. The cardiac silhouette is normal in size. The pulmonary vascularity is mildly prominent centrally. The mediastinum is normal in width. There is a trace of blunting of the costophrenic angles. The bony structures exhibit no acute abnormalities.  IMPRESSION: 1. Increased density projecting over the lower thoracic spine on the lateral film likely corresponds to density in the retrocardiac region on the  frontal film. This is consistent with pneumonia. 2. One cannot exclude low-grade compensated CHF.   Electronically Signed   By: David  Martinique   On: 07/10/2013 14:17   US Renal  07/10/2013   CLINICAL DATA:  Renal failure. Elevated creatinine and BUN. Hypertension and diabetes.  EXAM: RENAL/URINARY TRACT ULTRASOUND COMPLETE  COMPARISON:  09/06/2011  FINDINGS: Right Kidney:  Length: 8.5 cm. Slight volume loss with lobular cortex showing slightly increased echogenicity. No mass, cyst, stone or hydronephrosis.  Left Kidney:  Length: 8.8 cm. Slight volume loss with lobular cortex, slightly increased echogenicity. No  mass, cyst, stone or hydronephrosis.  Bladder:  Normal with bilateral ureteral jets.  IMPRESSION: Kidneys are smaller than were seen in 2013. Slight cortical volume loss. No evidence of obstruction or acute pathology.   Electronically Signed   By: Nelson Chimes M.D.   On: 07/10/2013 18:45   Medications: Scheduled Meds: . atenolol  50 mg Oral BID  . azithromycin  500 mg Intravenous Q24H  . cefTRIAXone (ROCEPHIN)  IV  1 g Intravenous Q24H  . enoxaparin (LOVENOX) injection  30 mg Subcutaneous Q24H  . furosemide  80 mg Oral Daily  . hydrALAZINE  50 mg Oral 3 times per day  . insulin aspart  0-9 Units Subcutaneous 6 times per day  . insulin glargine  5 Units Subcutaneous QHS   Continuous Infusions: . sodium chloride     PRN Meds:.acetaminophen, acetaminophen, dextrose, hydrALAZINE, HYDROcodone-acetaminophen, HYDROmorphone (DILAUDID) injection, ondansetron (ZOFRAN) IV, ondansetron, promethazine  Assessment/Plan: Principal Problem:   Acute renal failure Active Problems:   Hyperkalemia   DKA (diabetic ketoacidoses)   Malignant hypertension   Chronic kidney disease, stage IV (severe)   Noncompliance   Anemia   Dyspnea   Nausea and vomiting  Principal Problem:  Acute renal failure - as per nephrology. Renal ultrasound revealed no hydronephrosis with cortical renal disease.  Active Problems:  Hyperkalemia - resolved  Type 2 diabetes mellitus - sliding scale NovoLog insulin for now  Malignant hypertension - improved but having nausea and vomiting.  May be secondary to one of her blood pressure medications, possibly hydralazine.  It may be that hydralazine is not tolerated well.  Consider stopping hydralazine and starting amlodipine.  Would like some guidance from nephrology.  Wonder about stopping hydralazine and trying topical Catapres patch and/or amlodipine..... Chronic kidney disease, stage IV (severe) - As per nephrology - seen by Dr. Deitra Mayo and has had vein mapping for  possible fistula formation  Noncompliance - Would likely improve greatly with Medicaid provided to the patient  Anemia - Likely multifactorial but primarily secondary to renal disease  Dyspnea - could be a component of volume overload.  Also has a retrocardiac pneumonia - continue azithromycin and Rocephin for now.  Patient's volume status is up 1400 cc since yesterday.  Will give Lasix 120 milligrams IV times one and see if she responds to that.  Check chest x-ray  Retrocardiac infiltrate consistent with probable pneumonia - as above  Nausea and vomiting - could be related to a medication along with renal failure.  Well-known side effect of hydralazine at times.  Blood pressure is better. No focal symptoms. Control blood pressure and treat with Zofran as needed. No evidence of bowel obstruction on exam.    LOS: 2 days   Henrine Screws, MD 07/12/2013, 7:26 AM

## 2013-07-12 NOTE — Progress Notes (Signed)
[facility] ROUNDING NOTE  Subjective:  Still complains of nausea Thinks it might be med related Wants to get up and around Still orthopneic Says "peeing more" with lasix although recorded i/o positive Will get access next week Needs Medicaid application  Objective Vital signs in last 24 hours: Filed Vitals:   07/12/13 0353 07/12/13 0438 07/12/13 0646 07/12/13 0853  BP: 193/79 146/68 158/72 166/71  Pulse:  64    Temp: 98.1 F (36.7 C)   97.7 F (36.5 C)  TempSrc: Oral   Oral  Resp:  24    Height:      Weight:      SpO2:  97%     Weight change:   Intake/Output Summary (Last 24 hours) at 07/12/13 1032 Last data filed at 07/12/13 0700  Gross per 24 hour  Intake   1707 ml  Output    470 ml  Net   1237 ml   Physical Exam:  Blood pressure 205/77, pulse 71, temperature 98.5 F (36.9 C), temperature source Oral, resp. rate 15, height 5\' 1"  (1.549 m), weight 84.6 kg (186 lb 8.2 oz), SpO2 96.00%. Obese WF NAD Not on O2 Looks tired Lungs with diminished BS bases No pericardial rub Trace-1+ edema LE's   Recent Labs Lab 07/10/13 1330 07/10/13 1913 07/10/13 2113 07/11/13 0340 07/12/13 0245  NA 131* 133* 134* 133*  132* 132*  K 5.0 4.4 4.5 4.5  4.4 4.4  CL 94* 98 97 98  97 96  CO2 18* 17* 17* 17*  16* 17*  GLUCOSE 481* 128* 141* 115*  112* 83  BUN 65* 63* 61* 64*  64* 65*  CREATININE 5.33* 5.26* 5.31* 5.44*  5.35* 5.79*  CALCIUM 8.9 9.0 8.6 8.9  8.8 9.1  PHOS  --   --   --  5.0* 6.0*   PTH pending  Recent Labs Lab 07/10/13 1330 07/11/13 0340 07/12/13 0245  AST 12  --   --   ALT 17  --   --   ALKPHOS 130*  --   --   BILITOT <0.2*  --   --   PROT 6.1  --   --   ALBUMIN 2.6* 2.4* 2.5*    Recent Labs Lab 07/10/13 1330 07/11/13 0340 07/12/13 0245  WBC 5.5 5.9 5.8  NEUTROABS 4.1  --  3.4  HGB 11.2* 10.9* 10.8*  HCT 32.8* 31.6* 31.6*  MCV 77.7* 77.3* 78.6  PLT 186 215 228   CBG:  Recent Labs Lab 07/11/13 2158 07/11/13 2205  07/11/13 2356 07/12/13 0345 07/12/13 0853  GLUCAP 98 161* 115* 77 111*    Iron Studies:   Recent Labs Lab 07/11/13 0340  IRON 14*  TIBC 231*  FERRITIN 117  Vit D, 25-Hydroxy  12 (*) 30 - 89 ng/mL   Studies/Results: Dg Chest 2 View  07/10/2013   CLINICAL DATA:  Orthopnea  EXAM: CHEST  2 VIEW  COMPARISON:  None.  FINDINGS: The lungs are adequately inflated. There is hazy increased density projecting over the lower thoracic spine which is likely on the left on the frontal film. This may reflect atelectasis or pneumonia. The cardiac silhouette is normal in size. The pulmonary vascularity is mildly prominent centrally. The mediastinum is normal in width. There is a trace of blunting of the costophrenic angles. The bony structures exhibit no acute abnormalities.  IMPRESSION: 1. Increased density projecting over the lower thoracic spine on the lateral film likely corresponds to density in the retrocardiac region on  the frontal film. This is consistent with pneumonia. 2. One cannot exclude low-grade compensated CHF.   Electronically Signed   By: David  [place]   On: 07/10/2013 14:17   US Renal  07/10/2013   CLINICAL DATA:  Renal failure. Elevated creatinine and BUN. Hypertension and diabetes.  EXAM: RENAL/URINARY TRACT ULTRASOUND COMPLETE  COMPARISON:  09/06/2011  FINDINGS: Right Kidney:  Length: 8.5 cm. Slight volume loss with lobular cortex showing slightly increased echogenicity. No mass, cyst, stone or hydronephrosis.  Left Kidney:  Length: 8.8 cm. Slight volume loss with lobular cortex, slightly increased echogenicity. No mass, cyst, stone or hydronephrosis.  Bladder:  Normal with bilateral ureteral jets.  IMPRESSION: Kidneys are smaller than were seen in 2013. Slight cortical volume loss. No evidence of obstruction or acute pathology.   Electronically Signed   By: Paulina Fusi M.D.   On: 07/10/2013 18:45   Dg Chest Port 1 View  07/12/2013   CLINICAL DATA:  Difficulty breathing  EXAM: PORTABLE  CHEST - 1 VIEW  COMPARISON:  July 10, 2013  FINDINGS: There is underlying emphysematous change. There is mild atelectatic change in the left base. Elsewhere lungs are clear. Heart size and pulmonary vascularity are normal. No adenopathy. No pneumothorax. No bone lesions.  IMPRESSION: Mild atelectatic change left base. Underlying emphysematous change. No edema or consolidation.   Electronically Signed   By: Bretta Bang M.D.   On: 07/12/2013 07:55   ECHO EF 60-65% Grade 1 diastolic dysfunction  Medications: . sodium chloride     . atenolol  50 mg Oral BID  . azithromycin  500 mg Intravenous Q24H  . cefTRIAXone (ROCEPHIN)  IV  1 g Intravenous Q24H  . enoxaparin (LOVENOX) injection  30 mg Subcutaneous Q24H  . hydrALAZINE  50 mg Oral 3 times per day  . insulin aspart  0-9 Units Subcutaneous 6 times per day  . insulin glargine  5 Units Subcutaneous QHS     Impression  53 yo WF with longstanding DM and HTN, both poorly controlled for at least a year, with CKD present as far back as 2013 with creatinine of 2.5 at that time, 3 in early 2014, and 5.3 now - likely representing progression of underlying disease and not likely reversible (likely to worsen as blood pressure is brought down) Ultrasound reveals bilaterally small kidneys. UPC 14 grams proteinuria.  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   1. CKD5 - DM+HTN. [place] bilat small kidneys.  UPC 14 gms prot. SPEP still pending. saving left arm; vein mapping ordered; [facility] (Dr. Edilia Bo) can do access next week. Explained to patient that there is likely no reversibility. Dialysis education (no indications at present). Unclear if nausea uremic vs med related vs gastroparesis vs reflux; add PPI; change BP meds - see below; HOPE if nausea resolves that could be discharged with outpt followup (I will see for renal issues) 2. Bones - PTH  pending. 25 Vit D quite low;  Replete 50,000 twice a week 3. DM - per primary service.  4. Uncontrolled/accelerated HTN - Initial goal of 140/90 then ease down further; impaired autoregulation->will like have worsening of renal function as BP controlled; NO ACE or ARB with this advanced CKD; change hydralazine to amlodipine to see if this is cause N/V. Continue current furosemide 80/day 5. SOB - PNA +/-CHF; no overt pulm edema on repeat  CXR; echo normal EF; grade 1 diastolic dysf; repeat CXR basilar atelectasis and no edema.  Unclear why so much PND at this point. Dr. Kevan NyGates has ordered extra lasix IV - likely won't hurt. 6. Anemia - only mild and better than expected for current GFR; iron deficient by studies; Feraheme X 1 7. Asthma/seasonal allergy - albuterol 8. Metabolic acidosis - gap of 19; urine ketones neg; renal failure in large part 9. CAP - atb's per primary service  Camille Balynthia Eshal Propps, MD [facility] [phone] Pager 07/12/2013, 10:40 AM

## 2013-07-12 NOTE — Progress Notes (Signed)
   VASCULAR PROGRESS NOTE  SUBJECTIVE: No complaints  PHYSICAL EXAM: Filed Vitals:   07/11/13 2107 07/12/13 0000 07/12/13 0353 07/12/13 0646  BP: 171/83 166/76 193/79 158/72  Pulse: 63 65    Temp:  98.5 F (36.9 C) 98.1 F (36.7 C)   TempSrc:  Oral Oral   Resp: 17 20    Height:      Weight:      SpO2:  97%     No change in exam  LABS: Lab Results  Component Value Date   WBC 5.8 07/12/2013   HGB 10.8* 07/12/2013   HCT 31.6* 07/12/2013   MCV 78.6 07/12/2013   PLT 228 07/12/2013   Lab Results  Component Value Date   CREATININE 5.79* 07/12/2013   No results found for this basename: INR, PROTIME   CBG (last 3)   Recent Labs  07/11/13 2205 07/11/13 2356 07/12/13 0345  GLUCAP 161* 115* 77    Principal Problem:   Acute renal failure Active Problems:   Hyperkalemia   DKA (diabetic ketoacidoses)   Malignant hypertension   Chronic kidney disease, stage IV (severe)   Noncompliance   Anemia   Dyspnea  CAROTID DUPLEX: 40-59% right ICA stenosis, 1-39% left ICA stenosis  ASSESSMENT AND PLAN:  * Plan right brachial cephalic AVF next week if she is still here. Otherwise we can arrange as an outpt.  *  Moderate right carotid stenosis (asymptomatic). I will arrange a f/u carotid duplex in 1 year.   Cari CarawayChris Dickson Beeper: [phone]: [phone] 07/12/2013

## 2013-07-12 NOTE — Progress Notes (Addendum)
Patient received hydralzine 10mg  IV around 12:40 PM for elevated BP 188/79ure came down to 131/69 but now has gone back up to 192/85. Have notified Dr. Kevan NyGates and have received new orders for clonidine 0.1mg  po BID.

## 2013-07-13 ENCOUNTER — Other Ambulatory Visit: Payer: Self-pay | Admitting: *Deleted

## 2013-07-13 ENCOUNTER — Telehealth: Payer: Self-pay | Admitting: Vascular Surgery

## 2013-07-13 DIAGNOSIS — I6529 Occlusion and stenosis of unspecified carotid artery: Secondary | ICD-10-CM

## 2013-07-13 LAB — RENAL FUNCTION PANEL
Albumin: 2.3 g/dL — ABNORMAL LOW (ref 3.5–5.2)
BUN: 65 mg/dL — ABNORMAL HIGH (ref 6–23)
CO2: 18 meq/L — ABNORMAL LOW (ref 19–32)
Calcium: 8.5 mg/dL (ref 8.4–10.5)
Chloride: 96 meq/L (ref 96–112)
Creatinine, Ser: 6.36 mg/dL — ABNORMAL HIGH (ref 0.50–1.10)
GFR calc Af Amer: 8 mL/min — ABNORMAL LOW
GFR calc non Af Amer: 7 mL/min — ABNORMAL LOW
Glucose, Bld: 107 mg/dL — ABNORMAL HIGH (ref 70–99)
Phosphorus: 6 mg/dL — ABNORMAL HIGH (ref 2.3–4.6)
Potassium: 4 meq/L (ref 3.7–5.3)
Sodium: 132 meq/L — ABNORMAL LOW (ref 137–147)

## 2013-07-13 LAB — UIFE/LIGHT CHAINS/TP QN, 24-HR UR
ALBUMIN, U: DETECTED
ALPHA 2 UR: DETECTED — AB
Alpha 1, Urine: DETECTED — AB
Beta, Urine: DETECTED — AB
Free Kappa Lt Chains,Ur: 22.3 mg/dL — ABNORMAL HIGH (ref 0.14–2.42)
Free Kappa/Lambda Ratio: 4.8 ratio (ref 2.04–10.37)
Free Lambda Lt Chains,Ur: 4.65 mg/dL — ABNORMAL HIGH (ref 0.02–0.67)
GAMMA UR: DETECTED — AB
Total Protein, Urine: 460.5 mg/dL

## 2013-07-13 LAB — GLUCOSE, CAPILLARY
GLUCOSE-CAPILLARY: 158 mg/dL — AB (ref 70–99)
GLUCOSE-CAPILLARY: 202 mg/dL — AB (ref 70–99)
GLUCOSE-CAPILLARY: 214 mg/dL — AB (ref 70–99)
Glucose-Capillary: 144 mg/dL — ABNORMAL HIGH (ref 70–99)
Glucose-Capillary: 151 mg/dL — ABNORMAL HIGH (ref 70–99)
Glucose-Capillary: 58 mg/dL — ABNORMAL LOW (ref 70–99)
Glucose-Capillary: 86 mg/dL (ref 70–99)
Glucose-Capillary: 95 mg/dL (ref 70–99)

## 2013-07-13 LAB — PROTEIN ELECTROPHORESIS, SERUM
ALPHA-2-GLOBULIN: 21 % — AB (ref 7.1–11.8)
Albumin ELP: 51 % — ABNORMAL LOW (ref 55.8–66.1)
Alpha-1-Globulin: 7.5 % — ABNORMAL HIGH (ref 2.9–4.9)
BETA GLOBULIN: 6.3 % (ref 4.7–7.2)
Beta 2: 6.2 % (ref 3.2–6.5)
Gamma Globulin: 8 % — ABNORMAL LOW (ref 11.1–18.8)
M-SPIKE, %: NOT DETECTED g/dL
TOTAL PROTEIN ELP: 5.5 g/dL — AB (ref 6.0–8.3)

## 2013-07-13 MED ORDER — AZITHROMYCIN 500 MG PO TABS
500.0000 mg | ORAL_TABLET | Freq: Every day | ORAL | Status: DC
  Filled 2013-07-13: qty 1

## 2013-07-13 MED ORDER — FERROUS GLUCONATE 324 (38 FE) MG PO TABS
324.0000 mg | ORAL_TABLET | Freq: Every day | ORAL | Status: DC
  Administered 2013-07-13 – 2013-07-14 (×2): 324 mg via ORAL
  Filled 2013-07-13 (×3): qty 1

## 2013-07-13 MED ORDER — LEVOFLOXACIN 250 MG PO TABS
250.0000 mg | ORAL_TABLET | Freq: Every day | ORAL | Status: DC
  Administered 2013-07-13 – 2013-07-15 (×3): 250 mg via ORAL
  Filled 2013-07-13 (×3): qty 1

## 2013-07-13 MED ORDER — FERROUS GLUCONATE 324 (38 FE) MG PO TABS: 325.0000 mg | ORAL_TABLET | Freq: Two times a day (BID) | ORAL | Status: DC

## 2013-07-13 MED ORDER — SODIUM BICARBONATE 650 MG PO TABS
650.0000 mg | ORAL_TABLET | Freq: Three times a day (TID) | ORAL | Status: DC
  Administered 2013-07-13 – 2013-07-17 (×12): 650 mg via ORAL
  Filled 2013-07-13 (×15): qty 1

## 2013-07-13 MED ORDER — LEVOFLOXACIN 500 MG PO TABS: 500.0000 mg | ORAL_TABLET | Freq: Every day | ORAL | Status: DC

## 2013-07-13 MED ORDER — FERROUS GLUCONATE 324 (38 FE) MG PO TABS
648.0000 mg | ORAL_TABLET | Freq: Every day | ORAL | Status: DC
  Administered 2013-07-14 – 2013-07-15 (×2): 648 mg via ORAL
  Filled 2013-07-13 (×2): qty 2

## 2013-07-13 MED ORDER — ALBUTEROL SULFATE (2.5 MG/3ML) 0.083% IN NEBU: 2.5000 mg | INHALATION_SOLUTION | Freq: Four times a day (QID) | RESPIRATORY_TRACT | Status: DC | PRN

## 2013-07-13 NOTE — Progress Notes (Signed)
[name] ROUNDING NOTE  Subjective:  Feeling much better No nausea since yesterday around 2PM Eating Able to lie flat without SOB Creatinine rising however Next couple of days will determine if need to start HD this admit   Objective Vital signs in last 24 hours: Filed Vitals:   07/12/13 2119 07/13/13 0004 07/13/13 0406 07/13/13 0718  BP: 153/74 142/71 135/62 145/60  Pulse: 66 62 62 59  Temp:  98.5 F (36.9 C) 98.8 F (37.1 C) 97.6 F (36.4 C)  TempSrc:  Oral Oral Oral  Resp:  24 16 16   Height:      Weight:      SpO2:  97% 97% 99%   Weight change:   Intake/Output Summary (Last 24 hours) at 07/13/13 1049 Last data filed at 07/13/13 0700  Gross per 24 hour  Intake    370 ml  Output   1625 ml  Net  -1255 ml   Physical Exam:  Blood pressure 205/77, pulse 71, temperature 98.5 F (36.9 C), temperature source Oral, resp. rate 15, height 5\' 1"  (1.549 m), weight 84.6 kg (186 lb 8.2 oz), SpO2 96.00%. Obese WF NAD Not on O2 Looks more comfortable, NAD Lungs with diminished BS bases but no crackles No pericardial rub No LE edema   Recent Labs Lab 07/10/13 1330 07/10/13 1913 07/10/13 2113 07/11/13 0340 07/12/13 0245 07/13/13 0319  NA 131* 133* 134* 133*  132* 132* 132*  K 5.0 4.4 4.5 4.5  4.4 4.4 4.0  CL 94* 98 97 98  97 96 96  CO2 18* 17* 17* 17*  16* 17* 18*  GLUCOSE 481* 128* 141* 115*  112* 83 107*  BUN 65* 63* 61* 64*  64* 65* 65*  CREATININE 5.33* 5.26* 5.31* 5.44*  5.35* 5.79* 6.36*  CALCIUM 8.9 9.0 8.6 8.9  8.8 9.1 8.5  PHOS  --   --   --  5.0* 6.0* 6.0*   PTH pending  Recent Labs Lab 07/10/13 1330 07/11/13 0340 07/12/13 0245 07/13/13 0319  AST 12  --   --   --   ALT 17  --   --   --   ALKPHOS 130*  --   --   --   BILITOT <0.2*  --   --   --   PROT 6.1  --   --   --   ALBUMIN 2.6* 2.4* 2.5* 2.3*    Recent Labs Lab 07/10/13 1330 07/11/13 0340 07/12/13 0245  WBC 5.5 5.9 5.8  NEUTROABS 4.1  --  3.4  HGB 11.2*  10.9* 10.8*  HCT 32.8* 31.6* 31.6*  MCV 77.7* 77.3* 78.6  PLT 186 215 228    Recent Labs Lab 07/12/13 2115 07/13/13 0004 07/13/13 0403 07/13/13 0647 07/13/13 0717  GLUCAP 191* 151* 86 58* 95    Recent Labs Lab 07/11/13 0340  IRON 14*  TIBC 231*  FERRITIN 117   Vit D, 25-Hydroxy  12 (*) 30 - 89 ng/mL   Studies/Results: Dg Chest Port 1 View  07/12/2013   CLINICAL DATA:  Difficulty breathing  EXAM: PORTABLE CHEST - 1 VIEW  COMPARISON:  July 10, 2013  FINDINGS: There is underlying emphysematous change. There is mild atelectatic change in the left base. Elsewhere lungs are clear. Heart size and pulmonary vascularity are normal. No adenopathy. No pneumothorax. No bone lesions.  IMPRESSION: Mild atelectatic change left base. Underlying emphysematous change. No edema or consolidation.   Electronically Signed   By: Bretta BangWilliam  Woodruff M.D.  On: 07/12/2013 07:55   ECHO EF 60-65% Grade 1 diastolic dysfunction  Medications: . sodium chloride     . amLODipine  10 mg Oral Daily  . atenolol  50 mg Oral BID  . azithromycin  500 mg Intravenous Q24H  . cefTRIAXone (ROCEPHIN)  IV  1 g Intravenous Q24H  . cloNIDine  0.1 mg Oral BID  . enoxaparin (LOVENOX) injection  30 mg Subcutaneous Q24H  . insulin aspart  0-9 Units Subcutaneous 6 times per day  . insulin glargine  5 Units Subcutaneous QHS  . pantoprazole  40 mg Oral Daily  . Vitamin D (Ergocalciferol)  50,000 Units Oral Once per day on Mon Thu     Impression  53 yo WF with longstanding DM and HTN, both poorly controlled for at least a year, with CKD present as far back as 2013 with creatinine of 2.5 at that time, 3 in early 2014, and 5.3 now - likely representing progression of underlying disease and not likely reversible (likely to worsen as blood pressure is brought down) Ultrasound reveals bilaterally small kidneys. UPC 14 grams proteinuria.  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   1. CKD5 - DM+HTN. [place] bilat small kidneys.  UPC 14 gms prot. SPEP still pending. saving left arm; vein mapping ordered; [facility] (Dr. Edilia Bo) can do access next week. Explained to patient that there is likely no reversibility. Dialysis education (no indications at present). Nausea has improved with med changes.  Creatinine however is rising.  Trend over next couple of days will determine if need to start HD this admit vs discharge for access and outpt followup 2. Bones - PTH pending. 25 Vit D quite low;  Replete 50,000 twice a week 3. DM - per primary service.  4. Uncontrolled/accelerated HTN - Initial goal of 140/90 then ease down further; impaired autoregulation->will like have worsening of renal function as BP controlled; NO ACE or ARB with this advanced CKD; changed hydralazine to amlodipine with imp in N/V. Now on clonidine as well.. Continue current furosemide 80/day 5. SOB - PNA +/-CHF; no overt pulm edema on repeat CXR; echo normal EF; grade 1 diastolic dysf; repeat CXR basilar atelectasis and no edema.  PND/orthopnea much better! 6. Anemia - only mild and better than expected for current GFR; iron deficient by studies; Feraheme X 1 7. Asthma/seasonal allergy - albuterol 8. Metabolic acidosis - gap of 19; urine ketones neg; renal failure in large part; oral bicarb added 9. CAP - atb's per primary service  Camille Bal, MD [name] [phone] Pager 07/13/2013, 10:49 AM

## 2013-07-13 NOTE — Progress Notes (Signed)
VASCULAR SURGERY  The patient is scheduled for a right brachiocephalic AV fistula on Tuesday next week by Dr. Fields. If the decision is made that she will need dialysis this admission we could place a catheter at the same time. Please let us know.   [name] Beeper [phone] 07/13/2013  

## 2013-07-13 NOTE — Progress Notes (Signed)
Hypoglycemic Event  CBG: 58 at 0647  Treatment: 15 GM carbohydrate snack  Symptoms: Sweaty and Hungry  Follow-up CBG: Time:0717 CBG Result:95  Possible Reasons for Event: Inadequate meal intake and Medication regimen: lantus and sliding scale  Comments/MD notified:Dr. Kevan NyGates made aware on morning rounds    [name] A  Remember to initiate Hypoglycemia Order Set & complete

## 2013-07-13 NOTE — Telephone Encounter (Addendum)
Message copied by Fredrich BirksMILLIKAN, DANA P on Thu Jul 13, 2013  4:21 PM ------      Message from: Phillips OdorPULLINS, CAROL S      Created: Wed Jul 12, 2013 10:21 AM      Regarding: Tiburcio BashKay log; appt for 1 yr f/u                   ----- Message -----         From: Chuck Hinthristopher S Dickson, MD         Sent: 07/12/2013   6:51 AM           To: [facility] Charge Pool      Subject: charge/ schedule/ follow up                              Level 1 f/u visit today            Needs to be scheduled for right BC AVF early next week            Needs office visit and carotid duplex in 1 year to follow a moderate carotid stenosis.             Thanks            CD ------  07/13/13: lm for pt and mailed letter, dpm

## 2013-07-13 NOTE — Progress Notes (Signed)
PHARMACIST - PHYSICIAN COMMUNICATION DR:   Kevan NyGates CONCERNING: Antibiotic IV to Oral Route Change Policy  RECOMMENDATION: This patient is receiving Azithromycin by the intravenous route.  Based on criteria approved by the Pharmacy and Therapeutics Committee, the antibiotic(s) is/are being converted to the equivalent oral dose form(s).   DESCRIPTION: These criteria include:  Patient being treated for a respiratory tract infection, urinary tract infection, cellulitis or clostridium difficile associated diarrhea if on metronidazole  The patient is not neutropenic and does not exhibit a GI malabsorption state  The patient is eating (either orally or via tube) and/or has been taking other orally administered medications for a least 24 hours  The patient is improving clinically and has a Tmax < 100.5  If you have questions about this conversion, please contact the Pharmacy Department  []   [phone]( [phone] )  [facility] [x]   [phone]( [phone] )  [facility]  []   [phone]( [phone] )  [facility] []   [phone]( [phone] )  [facility]   Georgina PillionElizabeth Exilda Wilhite, PharmD, BCPS Clinical Pharmacist Pager: [phone] 07/13/2013 11:26 AM

## 2013-07-13 NOTE — Progress Notes (Signed)
Patient being transferred to 6E per doctor's order. Report called. Medications up to date. Belongings transferred with patient.

## 2013-07-13 NOTE — Progress Notes (Signed)
Subjective: [name] is feeling much better. The IV hydralazine seemed to cause nausea and vomiting. Hungry and ready to eat. Was given Lasix 120 mg yesterday with I&O's negative by about a liter.   Objective: Weight change:   Intake/Output Summary (Last 24 hours) at 07/13/13 1431 Last data filed at 07/13/13 1300  Gross per 24 hour  Intake    670 ml  Output   1150 ml  Net   -480 ml   Filed Vitals:   07/13/13 0004 07/13/13 0406 07/13/13 0718 07/13/13 1142  BP: 142/71 135/62 145/60 155/76  Pulse: 62 62 59 61  Temp: 98.5 F (36.9 C) 98.8 F (37.1 C) 97.6 F (36.4 C) 97.8 F (36.6 C)  TempSrc: Oral Oral Oral Oral  Resp: 24 16 16 13   Height:      Weight:      SpO2: 97% 97% 99% 99%     General Appearance: Alert, nauseous, conversive  Lungs: breath sounds distant in bases  Heart: Regular rate and rhythm, S1 and S2 normal, no murmur, rub or gallop  Abdomen: Soft, non-tender, bowel sounds active all four quadrants, no masses, no organomegaly  Extremities: Extremities normal, atraumatic, no cyanosis or edema  Neuro: Oriented x3 nonfocal  Lab Results: Results for orders placed during the hospital encounter of 07/10/13 (from the past 48 hour(s))  IMMUNOFIXATION ELECTROPHORESIS, URINE (WITH TOT PROT)     Status: Abnormal   Collection Time    07/11/13  2:43 PM      Result Value Ref Range   Time RANDOM     Comment: CORRECTED ON 04/02 AT 1336: PREVIOUSLY REPORTED AS URINE, RANDOM, CORRECTED ON 03/31 AT 1453: PREVIOUSLY REPORTED AS 24   Volume, Urine RANDOM     Comment: CORRECTED ON 04/02 AT 1336: PREVIOUSLY REPORTED AS URINE, RANDOM   Total Protein, Urine 460.5     Comment: No established reference range.   Total Protein, Urine-Ur/day NOT CALC  10 - 140 mg/day   Comment: (NOTE)     Total urinary protein is determined by adding the albumin and Kappa     and/or Lambda light chains.  This value may not agree with the total     protein as determined by chemical methods, which  characteristically     underestimate urinary light chains.   Albumin, U DETECTED  DETECTED   Alpha 1, Urine DETECTED (*) NONE DETECTED   Alpha 2, Urine DETECTED (*) NONE DETECTED   Beta, Urine DETECTED (*) NONE DETECTED   Gamma Globulin, Urine DETECTED (*) NONE DETECTED   Free Kappa Lt Chains,Ur 22.30 (*) 0.14 - 2.42 mg/dL   Free Lt Chn Excr Rate NOT CALC     Free Lambda Lt Chains,Ur 4.65 (*) 0.02 - 0.67 mg/dL   Free Lambda Excretion/Day NOT CALC     Free Kappa/Lambda Ratio 4.80  2.04 - 10.37 ratio   Comment: (NOTE)   Immunofixation, Urine (NOTE)     Comment: No monoclonal free light chains (Bence Jones Protein) are detected.     Urine IFE shows polyclonal increase in free Kappa and/or free Lambda     light chains.     Reviewed by Odis Hollingshead, MD, PhD, FCAP (Electronic Signature on     File)     Performed at [place]     Status: None   Collection Time    07/11/13  2:43 PM      Result Value Ref Range   Strep Pneumo  Urinary Antigen NEGATIVE  NEGATIVE   Comment:            Infection due to S. pneumoniae     cannot be absolutely ruled out     since the antigen present     may be below the detection limit     of the test.  LEGIONELLA ANTIGEN, URINE     Status: None   Collection Time    07/11/13  2:43 PM      Result Value Ref Range   Specimen Description URINE, RANDOM     Special Requests NONE     Legionella Antigen, Urine       Value: Negative for Legionella pneumophilia serogroup 1     Performed at [facility]   Report Status 07/12/2013 FINAL    URINE CULTURE     Status: None   Collection Time    07/11/13  2:43 PM      Result Value Ref Range   Specimen Description URINE, RANDOM     Special Requests NONE     Culture  Setup Time       Value: 07/11/2013 19:12     Performed at [place]       Value: NO GROWTH     Performed at [facility]   Culture       Value: NO GROWTH      Performed at [facility]   Report Status 07/12/2013 FINAL    MICROALBUMIN / CREATININE URINE RATIO     Status: Abnormal   Collection Time    07/11/13  2:43 PM      Result Value Ref Range   Microalb, Ur 435.75 (*) 0.00 - 1.89 mg/dL   Comment: (NOTE)     Result repeated and verified.     Result confirmed by automatic dilution.   Creatinine, Urine 83.9     Microalb Creat Ratio 5193.7 (*) 0.0 - 30.0 mg/g   Comment: Performed at [facility]  NA AND K (SODIUM & POTASSIUM), RAND UR     Status: None   Collection Time    07/11/13  2:43 PM      Result Value Ref Range   Sodium, Ur 41     Potassium Urine Timed 39    GLUCOSE, CAPILLARY     Status: Abnormal   Collection Time    07/11/13  4:12 PM      Result Value Ref Range   Glucose-Capillary 197 (*) 70 - 99 mg/dL  GLUCOSE, CAPILLARY     Status: None   Collection Time    07/11/13  8:25 PM      Result Value Ref Range   Glucose-Capillary 79  70 - 99 mg/dL   Comment 1 Notify RN     Comment 2 Documented in Chart    GLUCOSE, CAPILLARY     Status: Abnormal   Collection Time    07/11/13  8:41 PM      Result Value Ref Range   Glucose-Capillary 182 (*) 70 - 99 mg/dL   Comment 1 Notify RN     Comment 2 Documented in Chart    GLUCOSE, CAPILLARY     Status: None   Collection Time    07/11/13  9:58 PM      Result Value Ref Range   Glucose-Capillary 98  70 - 99 mg/dL   Comment 1 Notify RN     Comment 2 Documented in Chart  GLUCOSE, CAPILLARY     Status: Abnormal   Collection Time    07/11/13 10:05 PM      Result Value Ref Range   Glucose-Capillary 161 (*) 70 - 99 mg/dL   Comment 1 Notify RN     Comment 2 Documented in Chart    GLUCOSE, CAPILLARY     Status: Abnormal   Collection Time    07/11/13 11:56 PM      Result Value Ref Range   Glucose-Capillary 115 (*) 70 - 99 mg/dL   Comment 1 Notify RN     Comment 2 Documented in Chart    CBC WITH DIFFERENTIAL     Status: Abnormal   Collection Time    07/12/13  2:45 AM       Result Value Ref Range   WBC 5.8  4.0 - 10.5 K/uL   RBC 4.02  3.87 - 5.11 MIL/uL   Hemoglobin 10.8 (*) 12.0 - 15.0 g/dL   HCT 31.6 (*) 36.0 - 46.0 %   MCV 78.6  78.0 - 100.0 fL   MCH 26.9  26.0 - 34.0 pg   MCHC 34.2  30.0 - 36.0 g/dL   RDW 13.4  11.5 - 15.5 %   Platelets 228  150 - 400 K/uL   Neutrophils Relative % 59  43 - 77 %   Neutro Abs 3.4  1.7 - 7.7 K/uL   Lymphocytes Relative 31  12 - 46 %   Lymphs Abs 1.8  0.7 - 4.0 K/uL   Monocytes Relative 8  3 - 12 %   Monocytes Absolute 0.5  0.1 - 1.0 K/uL   Eosinophils Relative 2  0 - 5 %   Eosinophils Absolute 0.1  0.0 - 0.7 K/uL   Basophils Relative 0  0 - 1 %   Basophils Absolute 0.0  0.0 - 0.1 K/uL  HEMOGLOBIN A1C     Status: Abnormal   Collection Time    07/12/13  2:45 AM      Result Value Ref Range   Hemoglobin A1C 14.2 (*) <5.7 %   Comment: (NOTE)                                                                               According to the ADA Clinical Practice Recommendations for 2011, when     HbA1c is used as a screening test:      >=6.5%   Diagnostic of Diabetes Mellitus               (if abnormal result is confirmed)     5.7-6.4%   Increased risk of developing Diabetes Mellitus     References:Diagnosis and Classification of Diabetes Mellitus,Diabetes     ONGE,9528,41(LKGMW 1):S62-S69 and Standards of Medical Care in             Diabetes - 2011,Diabetes Care,2011,34 (Suppl 1):S11-S61.   Mean Plasma Glucose 361 (*) <117 mg/dL   Comment: Performed at [facility]     Status: Abnormal   Collection Time    07/12/13  2:45 AM      Result Value Ref Range   Cholesterol 252 (*) 0 - 200 mg/dL  Triglycerides 271 (*) <150 mg/dL   HDL 38 (*) >39 mg/dL   Total CHOL/HDL Ratio 6.6     VLDL 54 (*) 0 - 40 mg/dL   LDL Cholesterol 160 (*) 0 - 99 mg/dL   Comment:            Total Cholesterol/HDL:CHD Risk     Coronary Heart Disease Risk Table                         Men   Women      1/2 Average Risk    3.4   3.3      Average Risk       5.0   4.4      2 X Average Risk   9.6   7.1      3 X Average Risk  23.4   11.0                Use the calculated Patient Ratio     above and the CHD Risk Table     to determine the patient's CHD Risk.                ATP III CLASSIFICATION (LDL):      <100     mg/dL   Optimal      100-129  mg/dL   Near or Above                        Optimal      130-159  mg/dL   Borderline      160-189  mg/dL   High      >190     mg/dL   Very High  RENAL FUNCTION PANEL     Status: Abnormal   Collection Time    07/12/13  2:45 AM      Result Value Ref Range   Sodium 132 (*) 137 - 147 mEq/L   Potassium 4.4  3.7 - 5.3 mEq/L   Chloride 96  96 - 112 mEq/L   CO2 17 (*) 19 - 32 mEq/L   Glucose, Bld 83  70 - 99 mg/dL   BUN 65 (*) 6 - 23 mg/dL   Creatinine, Ser 5.79 (*) 0.50 - 1.10 mg/dL   Calcium 9.1  8.4 - 10.5 mg/dL   Phosphorus 6.0 (*) 2.3 - 4.6 mg/dL   Albumin 2.5 (*) 3.5 - 5.2 g/dL   GFR calc non Af Amer 8 (*) >90 mL/min   GFR calc Af Amer 9 (*) >90 mL/min   Comment: (NOTE)     The eGFR has been calculated using the CKD EPI equation.     This calculation has not been validated in all clinical situations.     eGFR's persistently <90 mL/min signify possible Chronic Kidney     Disease.  GLUCOSE, CAPILLARY     Status: None   Collection Time    07/12/13  3:45 AM      Result Value Ref Range   Glucose-Capillary 77  70 - 99 mg/dL  GLUCOSE, CAPILLARY     Status: Abnormal   Collection Time    07/12/13  8:53 AM      Result Value Ref Range   Glucose-Capillary 111 (*) 70 - 99 mg/dL   Comment 1 Documented in Chart     Comment 2 Notify RN    GLUCOSE, CAPILLARY     Status: Abnormal   Collection Time  07/12/13 12:09 PM      Result Value Ref Range   Glucose-Capillary 101 (*) 70 - 99 mg/dL   Comment 1 Documented in Chart     Comment 2 Notify RN    GLUCOSE, CAPILLARY     Status: None   Collection Time    07/12/13  3:52 PM      Result Value Ref Range    Glucose-Capillary 75  70 - 99 mg/dL   Comment 1 Documented in Chart     Comment 2 Notify RN    GLUCOSE, CAPILLARY     Status: Abnormal   Collection Time    07/12/13  9:15 PM      Result Value Ref Range   Glucose-Capillary 191 (*) 70 - 99 mg/dL  GLUCOSE, CAPILLARY     Status: Abnormal   Collection Time    07/13/13 12:04 AM      Result Value Ref Range   Glucose-Capillary 151 (*) 70 - 99 mg/dL   Comment 1 Notify RN     Comment 2 Documented in Chart    RENAL FUNCTION PANEL     Status: Abnormal   Collection Time    07/13/13  3:19 AM      Result Value Ref Range   Sodium 132 (*) 137 - 147 mEq/L   Potassium 4.0  3.7 - 5.3 mEq/L   Chloride 96  96 - 112 mEq/L   CO2 18 (*) 19 - 32 mEq/L   Glucose, Bld 107 (*) 70 - 99 mg/dL   BUN 65 (*) 6 - 23 mg/dL   Creatinine, Ser 6.36 (*) 0.50 - 1.10 mg/dL   Calcium 8.5  8.4 - 10.5 mg/dL   Phosphorus 6.0 (*) 2.3 - 4.6 mg/dL   Albumin 2.3 (*) 3.5 - 5.2 g/dL   GFR calc non Af Amer 7 (*) >90 mL/min   GFR calc Af Amer 8 (*) >90 mL/min   Comment: (NOTE)     The eGFR has been calculated using the CKD EPI equation.     This calculation has not been validated in all clinical situations.     eGFR's persistently <90 mL/min signify possible Chronic Kidney     Disease.  GLUCOSE, CAPILLARY     Status: None   Collection Time    07/13/13  4:03 AM      Result Value Ref Range   Glucose-Capillary 86  70 - 99 mg/dL   Comment 1 Notify RN     Comment 2 Documented in Chart    GLUCOSE, CAPILLARY     Status: Abnormal   Collection Time    07/13/13  6:47 AM      Result Value Ref Range   Glucose-Capillary 58 (*) 70 - 99 mg/dL   Comment 1 Notify RN     Comment 2 Documented in Chart    GLUCOSE, CAPILLARY     Status: None   Collection Time    07/13/13  7:17 AM      Result Value Ref Range   Glucose-Capillary 95  70 - 99 mg/dL   Comment 1 Notify RN     Comment 2 Documented in Chart    GLUCOSE, CAPILLARY     Status: Abnormal   Collection Time    07/13/13 12:02 PM       Result Value Ref Range   Glucose-Capillary 158 (*) 70 - 99 mg/dL    Studies/Results: Dg Chest Port 1 View  07/12/2013   CLINICAL DATA:  Difficulty breathing  EXAM: PORTABLE CHEST - 1 VIEW  COMPARISON:  July 10, 2013  FINDINGS: There is underlying emphysematous change. There is mild atelectatic change in the left base. Elsewhere lungs are clear. Heart size and pulmonary vascularity are normal. No adenopathy. No pneumothorax. No bone lesions.  IMPRESSION: Mild atelectatic change left base. Underlying emphysematous change. No edema or consolidation.   Electronically Signed   By: Lowella Grip M.D.   On: 07/12/2013 07:55   Medications: Scheduled Meds: . amLODipine  10 mg Oral Daily  . atenolol  50 mg Oral BID  . cloNIDine  0.1 mg Oral BID  . enoxaparin (LOVENOX) injection  30 mg Subcutaneous Q24H  . insulin aspart  0-9 Units Subcutaneous 6 times per day  . insulin glargine  5 Units Subcutaneous QHS  . levofloxacin  250 mg Oral Daily  . pantoprazole  40 mg Oral Daily  . sodium bicarbonate  650 mg Oral TID  . Vitamin D (Ergocalciferol)  50,000 Units Oral Once per day on Mon Thu   Continuous Infusions: . sodium chloride     PRN Meds:.acetaminophen, acetaminophen, dextrose, hydrALAZINE, HYDROcodone-acetaminophen, HYDROmorphone (DILAUDID) injection, ondansetron (ZOFRAN) IV, ondansetron, promethazine  Assessment/Plan: Principal Problem:   Acute renal failure - renal failure actually worsening. Will certainly need dialysis. Need to follow creatinine over the next few days Active Problems:   Hyperkalemia - controlled   DKA (diabetic ketoacidoses) - resolved   Malignant hypertension - improved - off of hydralazine and now on                   clonidine, beta blocker, and calcium channel blocker   Chronic kidney disease, stage V (severe) - as per nephrology,                                 appreciate Dr. Sanda Klein help   Noncompliance - aware, will likely be much easier if she can get                                             covered by Medicaid   Anemia - not severe   Dyspnea - improved   CAP - changed from azithromycin and Rocephin to Levaquin. Dose              May need to be altered per pharmacy. Check chest x-ray tomorrow   Disposition - transferred to the renal floor on [facility] - and monitor over                        weekend for possible reversibility of renal failure    LOS: 3 days   Henrine Screws, MD 07/13/2013, 2:31 PM

## 2013-07-14 ENCOUNTER — Inpatient Hospital Stay (HOSPITAL_COMMUNITY): Payer: Medicaid Other

## 2013-07-14 LAB — GLUCOSE, CAPILLARY
GLUCOSE-CAPILLARY: 251 mg/dL — AB (ref 70–99)
GLUCOSE-CAPILLARY: 143 mg/dL — AB (ref 70–99)
Glucose-Capillary: 116 mg/dL — ABNORMAL HIGH (ref 70–99)
Glucose-Capillary: 137 mg/dL — ABNORMAL HIGH (ref 70–99)
Glucose-Capillary: 173 mg/dL — ABNORMAL HIGH (ref 70–99)
Glucose-Capillary: 190 mg/dL — ABNORMAL HIGH (ref 70–99)
Glucose-Capillary: 200 mg/dL — ABNORMAL HIGH (ref 70–99)

## 2013-07-14 LAB — RENAL FUNCTION PANEL
ALBUMIN: 2.6 g/dL — AB (ref 3.5–5.2)
BUN: 67 mg/dL — ABNORMAL HIGH (ref 6–23)
CO2: 19 mEq/L (ref 19–32)
Calcium: 8.4 mg/dL (ref 8.4–10.5)
Chloride: 96 mEq/L (ref 96–112)
Creatinine, Ser: 6.84 mg/dL — ABNORMAL HIGH (ref 0.50–1.10)
GFR, EST AFRICAN AMERICAN: 7 mL/min — AB (ref >90)
GFR, EST NON AFRICAN AMERICAN: 6 mL/min — AB (ref >90)
Glucose, Bld: 151 mg/dL — ABNORMAL HIGH (ref 70–99)
PHOSPHORUS: 6.5 mg/dL — AB (ref 2.3–4.6)
POTASSIUM: 4.3 meq/L (ref 3.7–5.3)
SODIUM: 134 meq/L — AB (ref 137–147)

## 2013-07-14 NOTE — Progress Notes (Signed)
[facility] ROUNDING NOTE  Subjective:  Feeling "great" Dr. Kevan Ny has ok'd to shower and ambulate Creatinine continues to rise Again, next couple of days will determine if need to start HD this admit (she would like to go home...)   Objective Vital signs in last 24 hours: Filed Vitals:   07/14/13 0508 07/14/13 0817 07/14/13 1046 07/14/13 1050  BP: 125/67 158/81 150/69   Pulse: 65 67  68  Temp: 98.5 F (36.9 C) 98.2 F (36.8 C)    TempSrc:  Oral    Resp: 19 18    Height:      Weight:      SpO2: 99% 98%     Weight change:   Intake/Output Summary (Last 24 hours) at 07/14/13 1148 Last data filed at 07/13/13 1600  Gross per 24 hour  Intake     50 ml  Output      0 ml  Net     50 ml   Physical Exam:  Blood pressure 205/77, pulse 71, temperature 98.5 F (36.9 C), temperature source Oral, resp. rate 15, height 5\' 1"  (1.549 m), weight 84.6 kg (186 lb 8.2 oz), SpO2 96.00%. Obese WF NAD Not on O2 NAD Lungs clear Normal S1S2 No S3 or pericardial rub No pericardial rub No LE edema   Recent Labs Lab 07/10/13 1330 07/10/13 1913 07/10/13 2113 07/11/13 0340 07/12/13 0245 07/13/13 0319 07/14/13 0552  NA 131* 133* 134* 133*  132* 132* 132* 134*  K 5.0 4.4 4.5 4.5  4.4 4.4 4.0 4.3  CL 94* 98 97 98  97 96 96 96  CO2 18* 17* 17* 17*  16* 17* 18* 19  GLUCOSE 481* 128* 141* 115*  112* 83 107* 151*  BUN 65* 63* 61* 64*  64* 65* 65* 67*  CREATININE 5.33* 5.26* 5.31* 5.44*  5.35* 5.79* 6.36* 6.84*  CALCIUM 8.9 9.0 8.6 8.9  8.8 9.1 8.5 8.4  PHOS  --   --   --  5.0* 6.0* 6.0* 6.5*   PTH pending  Recent Labs Lab 07/10/13 1330  07/12/13 0245 07/13/13 0319 07/14/13 0552  AST 12  --   --   --   --   ALT 17  --   --   --   --   ALKPHOS 130*  --   --   --   --   BILITOT <0.2*  --   --   --   --   PROT 6.1  --   --   --   --   ALBUMIN 2.6*  < > 2.5* 2.3* 2.6*  < > = values in this interval not displayed.  Recent Labs Lab 07/10/13 1330 07/11/13 0340  07/12/13 0245  WBC 5.5 5.9 5.8  NEUTROABS 4.1  --  3.4  HGB 11.2* 10.9* 10.8*  HCT 32.8* 31.6* 31.6*  MCV 77.7* 77.3* 78.6  PLT 186 215 228    Recent Labs Lab 07/13/13 1538 07/13/13 2003 07/13/13 2332 07/14/13 0318 07/14/13 0822  GLUCAP 202* 214* 137* 116* 173*    Recent Labs Lab 07/11/13 0340  IRON 14*  TIBC 231*  FERRITIN 117   Vit D, 25-Hydroxy  12 (*) 30 - 89 ng/mL   Studies/Results: Dg Chest 2 View  07/14/2013   CLINICAL DATA:  Hypertension, diabetes, left base atelectasis  EXAM: CHEST  2 VIEW  COMPARISON:  07/12/2013  FINDINGS: Normal heart size and vascularity. No focal pneumonia, collapse or consolidation. Resolved left base atelectasis. Trace left  effusion noted, appreciated on the lateral view. No pneumothorax. Trachea is midline.  IMPRESSION: Trace left pleural effusion.  No other acute finding.   Electronically Signed   By: Ruel Favorsrevor  Shick M.D.   On: 07/14/2013 09:41   ECHO EF 60-65% Grade 1 diastolic dysfunction  Medications: . sodium chloride     . amLODipine  10 mg Oral Daily  . atenolol  50 mg Oral BID  . cloNIDine  0.1 mg Oral BID  . enoxaparin (LOVENOX) injection  30 mg Subcutaneous Q24H  . ferrous gluconate  324 mg Oral QHS  . ferrous gluconate  648 mg Oral Daily  . insulin aspart  0-9 Units Subcutaneous 6 times per day  . insulin glargine  5 Units Subcutaneous QHS  . levofloxacin  250 mg Oral Daily  . pantoprazole  40 mg Oral Daily  . sodium bicarbonate  650 mg Oral TID  . Vitamin D (Ergocalciferol)  50,000 Units Oral Once per day on Mon Thu     Summary Impression  53 yo WF with longstanding DM and HTN, both poorly controlled for at least a year, with CKD present as far back as 2013 with creatinine of 2.5 at that time, 3 in early 2014, and 5.3 now - likely representing progression of underlying disease and not likely reversible (likely to worsen as blood pressure is brought down) Ultrasound reveals bilaterally small kidneys. UPC 14 grams  proteinuria.  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   1. CKD5 - DM+HTN. US bilat small kidneys.  UPC 14 gms prot. SPEP still pending. vein mapping done; for right brachiocephalic AVF on Tuesday.  Creatinine is still rising (I suspect in part due to controlling BP in setting of impaired autoregulation).  Trend over next couple of days will determine if need to start HD this admit vs discharge for access and outpt followup 2. Bones - PTH still pending. 25 Vit D quite low;  Replete 50,000 twice a week 3. DM - per primary service.  4. Uncontrolled/accelerated HTN - Improved.  NO ACE or ARB with this advanced CKD; changed hydralazine to amlodipine with imp in N/V. Now on clonidine as well. Continue current furosemide 80/day. 5. SOB - PNA +/-CHF; no overt pulm edema on repeat CXR; echo normal EF; grade 1 diastolic dysf; repeat CXR basilar atelectasis and no edema.  PND/orthopnea much better! ATB's changed to levaquin. For repeat CXR 6. Anemia - only mild and better than expected for current GFR; iron deficient by studies; Feraheme X 1was given; No ESA needed yet 7. Asthma/seasonal allergy - albuterol 8. Metabolic acidosis - gap of 19; urine ketones neg; renal failure in large part; oral bicarb added 9. CAP - atb's per primary service  Camille Balynthia Semisi Biela, MD [facility] [phone] Pager 07/14/2013, 11:48 AM

## 2013-07-14 NOTE — Progress Notes (Addendum)
Subjective: [name] is feeling much better overall.  Would like to shower and ambulate.  Creatinine still rising  Objective: Weight change:   Intake/Output Summary (Last 24 hours) at 07/14/13 0908 Last data filed at 07/13/13 1600  Gross per 24 hour  Intake     70 ml  Output      0 ml  Net     70 ml   Filed Vitals:   07/13/13 1518 07/13/13 2005 07/14/13 0508 07/14/13 0817  BP: 131/67 132/81 125/67 158/81  Pulse: 59 64 65 67  Temp: 97.7 F (36.5 C)  98.5 F (36.9 C) 98.2 F (36.8 C)  TempSrc: Oral   Oral  Resp: 16 18 19 18   Height:      Weight:      SpO2: 99% 98% 99% 98%    General Appearance: Alert, cooperative, no distress, appears stated age Lungs: Clear to auscultation bilaterally, respirations unlabored Heart: Regular rate and rhythm, S1 and S2 normal, no murmur, rub or gallop Abdomen: Soft, non-tender, bowel sounds active all four quadrants, no masses, no organomegaly Extremities: Extremities normal, atraumatic, no cyanosis or edema Neuro: Oriented x3, nonfocal  Lab Results: Results for orders placed during the hospital encounter of 07/10/13 (from the past 48 hour(s))  GLUCOSE, CAPILLARY     Status: Abnormal   Collection Time    07/12/13 12:09 PM      Result Value Ref Range   Glucose-Capillary 101 (*) 70 - 99 mg/dL   Comment 1 Documented in Chart     Comment 2 Notify RN    GLUCOSE, CAPILLARY     Status: None   Collection Time    07/12/13  3:52 PM      Result Value Ref Range   Glucose-Capillary 75  70 - 99 mg/dL   Comment 1 Documented in Chart     Comment 2 Notify RN    GLUCOSE, CAPILLARY     Status: Abnormal   Collection Time    07/12/13  9:15 PM      Result Value Ref Range   Glucose-Capillary 191 (*) 70 - 99 mg/dL  GLUCOSE, CAPILLARY     Status: Abnormal   Collection Time    07/13/13 12:04 AM      Result Value Ref Range   Glucose-Capillary 151 (*) 70 - 99 mg/dL   Comment 1 Notify RN     Comment 2 Documented in Chart    RENAL FUNCTION PANEL      Status: Abnormal   Collection Time    07/13/13  3:19 AM      Result Value Ref Range   Sodium 132 (*) 137 - 147 mEq/L   Potassium 4.0  3.7 - 5.3 mEq/L   Chloride 96  96 - 112 mEq/L   CO2 18 (*) 19 - 32 mEq/L   Glucose, Bld 107 (*) 70 - 99 mg/dL   BUN 65 (*) 6 - 23 mg/dL   Creatinine, Ser 6.36 (*) 0.50 - 1.10 mg/dL   Calcium 8.5  8.4 - 10.5 mg/dL   Phosphorus 6.0 (*) 2.3 - 4.6 mg/dL   Albumin 2.3 (*) 3.5 - 5.2 g/dL   GFR calc non Af Amer 7 (*) >90 mL/min   GFR calc Af Amer 8 (*) >90 mL/min   Comment: (NOTE)     The eGFR has been calculated using the CKD EPI equation.     This calculation has not been validated in all clinical situations.     eGFR's persistently <90 mL/min  signify possible Chronic Kidney     Disease.  GLUCOSE, CAPILLARY     Status: None   Collection Time    07/13/13  4:03 AM      Result Value Ref Range   Glucose-Capillary 86  70 - 99 mg/dL   Comment 1 Notify RN     Comment 2 Documented in Chart    GLUCOSE, CAPILLARY     Status: Abnormal   Collection Time    07/13/13  6:47 AM      Result Value Ref Range   Glucose-Capillary 58 (*) 70 - 99 mg/dL   Comment 1 Notify RN     Comment 2 Documented in Chart    GLUCOSE, CAPILLARY     Status: None   Collection Time    07/13/13  7:17 AM      Result Value Ref Range   Glucose-Capillary 95  70 - 99 mg/dL   Comment 1 Notify RN     Comment 2 Documented in Chart    GLUCOSE, CAPILLARY     Status: Abnormal   Collection Time    07/13/13 12:02 PM      Result Value Ref Range   Glucose-Capillary 158 (*) 70 - 99 mg/dL  GLUCOSE, CAPILLARY     Status: Abnormal   Collection Time    07/13/13  3:38 PM      Result Value Ref Range   Glucose-Capillary 202 (*) 70 - 99 mg/dL  GLUCOSE, CAPILLARY     Status: Abnormal   Collection Time    07/13/13  8:03 PM      Result Value Ref Range   Glucose-Capillary 214 (*) 70 - 99 mg/dL  GLUCOSE, CAPILLARY     Status: Abnormal   Collection Time    07/13/13 11:32 PM      Result Value Ref Range    Glucose-Capillary 137 (*) 70 - 99 mg/dL  GLUCOSE, CAPILLARY     Status: Abnormal   Collection Time    07/14/13  3:18 AM      Result Value Ref Range   Glucose-Capillary 116 (*) 70 - 99 mg/dL  RENAL FUNCTION PANEL     Status: Abnormal   Collection Time    07/14/13  5:52 AM      Result Value Ref Range   Sodium 134 (*) 137 - 147 mEq/L   Potassium 4.3  3.7 - 5.3 mEq/L   Chloride 96  96 - 112 mEq/L   CO2 19  19 - 32 mEq/L   Glucose, Bld 151 (*) 70 - 99 mg/dL   BUN 67 (*) 6 - 23 mg/dL   Creatinine, Ser 6.84 (*) 0.50 - 1.10 mg/dL   Calcium 8.4  8.4 - 10.5 mg/dL   Phosphorus 6.5 (*) 2.3 - 4.6 mg/dL   Albumin 2.6 (*) 3.5 - 5.2 g/dL   GFR calc non Af Amer 6 (*) >90 mL/min   GFR calc Af Amer 7 (*) >90 mL/min   Comment: (NOTE)     The eGFR has been calculated using the CKD EPI equation.     This calculation has not been validated in all clinical situations.     eGFR's persistently <90 mL/min signify possible Chronic Kidney     Disease.  GLUCOSE, CAPILLARY     Status: Abnormal   Collection Time    07/14/13  8:22 AM      Result Value Ref Range   Glucose-Capillary 173 (*) 70 - 99 mg/dL    Studies/Results: No results found.  Medications: Scheduled Meds: . amLODipine  10 mg Oral Daily  . atenolol  50 mg Oral BID  . cloNIDine  0.1 mg Oral BID  . enoxaparin (LOVENOX) injection  30 mg Subcutaneous Q24H  . ferrous gluconate  324 mg Oral QHS  . ferrous gluconate  648 mg Oral Daily  . insulin aspart  0-9 Units Subcutaneous 6 times per day  . insulin glargine  5 Units Subcutaneous QHS  . levofloxacin  250 mg Oral Daily  . pantoprazole  40 mg Oral Daily  . sodium bicarbonate  650 mg Oral TID  . Vitamin D (Ergocalciferol)  50,000 Units Oral Once per day on Mon Thu   Continuous Infusions: . sodium chloride     PRN Meds:.acetaminophen, acetaminophen, albuterol, dextrose, HYDROcodone-acetaminophen, HYDROmorphone (DILAUDID) injection, ondansetron (ZOFRAN) IV, ondansetron,  promethazine  Assessment/Plan: Principal Problem:   Acute renal failure Active Problems:   Hyperkalemia   DKA (diabetic ketoacidoses)   Malignant hypertension   Chronic kidney disease, stage IV (severe)   Noncompliance   Anemia   Dyspnea  Principal Problem:  Acute renal failure - renal failure actually worsening almost daily. Will certainly need dialysis. Need to follow creatinine over the next few days  Active Problems:  Hyperkalemia - controlled  DKA (diabetic ketoacidoses) - resolved  Malignant hypertension - improved - off of hydralazine and now on clonidine, beta blocker, and calcium channel blocker  Chronic kidney disease, stage V (severe) - as per nephrology, appreciate Dr. Dunham's/nephrology's help  Noncompliance - aware, will likely be much easier if she can get covered by Medicaid  Anemia - not severe  Dyspnea - improved  CAP - changed from azithromycin and Rocephin to Levaquin.  Check chest x-ray today  Disposition - continue to trend creatinine over the next several days.  Can discontinue telemetry and patient may shower and ambulate   LOS: 4 days   Henrine Screws, MD 07/14/2013, 9:08 AM

## 2013-07-14 NOTE — Progress Notes (Signed)
Spoke with patient.  She has had diabetes for about 8 years.  Has been on Metformin and Glyburide, but the Metformin had been discontinued due to her renal function.  She was on insulin at one time, but was not able to afford the Lantus. Recently has just been on Glyburide. To have surgery next week for AV fistula insertion. Suggested the [employer] home blood glucose meter and strips due to her financial issues.  If patient starts on dialysis, patient can get insurance to cover insulin.  Care management may be able to provide a match letter for medications for her if patient has not received one before now. Otherwise, patient may have to be on [employer] Novolin insulin 70/30 mix. Will continue to follow while in hospital.  Smith MinceKendra Edmon Magid RN BSN CDE

## 2013-07-14 NOTE — Plan of Care (Signed)
Problem: Food- and Nutrition-Related Knowledge Deficit (NB-1.1) Goal: Nutrition education Formal process to instruct or train a patient/client in a skill or to impart knowledge to help patients/clients voluntarily manage or modify food choices and eating behavior to maintain or improve health. Outcome: Completed/Met Date Met:  07/14/13  Nutrition Education Note  RD consulted for Renal an DM Education. Provided Kidney Disease Pyramid, High Phosphorus Foods to Avoids and Balanced Plate Handouts to patient. Reviewed food groups and provided written recommended serving sizes specifically determined for patient's current nutritional status.   Explained why diet restrictions are needed and provided lists of foods to limit/avoid that are high potassium, sodium, and phosphorus. Provided specific recommendations on safer alternatives of these foods. Strongly encouraged compliance of this diet.   Discussed importance of protein intake at each meal and snack. Provided examples of how to maximize protein intake throughout the day. Discussed need for fluid restriction with dialysis and renal-friendly beverage options.  Teach back method used.  Expect good compliance.  Body mass index is 35.26 kg/(m^2). Pt meets criteria for Obese Class I based on current BMI.  Current diet order is Renal Diet, patient is consuming approximately 50% of meals at this time. Labs and medications reviewed. No further nutrition interventions warranted at this time. RD contact information provided. If additional nutrition issues arise, please re-consult RD.  Inda Coke MS, RD, LDN Inpatient Registered Dietitian Pager: [phone] After-hours pager: [phone]

## 2013-07-15 ENCOUNTER — Inpatient Hospital Stay (HOSPITAL_COMMUNITY): Payer: Medicaid Other

## 2013-07-15 LAB — CBC WITH DIFFERENTIAL/PLATELET
BASOS PCT: 0 % (ref 0–1)
Basophils Absolute: 0 10*3/uL (ref 0.0–0.1)
EOS ABS: 0.1 10*3/uL (ref 0.0–0.7)
EOS PCT: 2 % (ref 0–5)
HEMATOCRIT: 28.3 % — AB (ref 36.0–46.0)
Hemoglobin: 9.5 g/dL — ABNORMAL LOW (ref 12.0–15.0)
Lymphocytes Relative: 28 % (ref 12–46)
Lymphs Abs: 1.2 10*3/uL (ref 0.7–4.0)
MCH: 26.6 pg (ref 26.0–34.0)
MCHC: 33.6 g/dL (ref 30.0–36.0)
MCV: 79.3 fL (ref 78.0–100.0)
MONO ABS: 0.4 10*3/uL (ref 0.1–1.0)
Monocytes Relative: 9 % (ref 3–12)
NEUTROS ABS: 2.6 10*3/uL (ref 1.7–7.7)
Neutrophils Relative %: 61 % (ref 43–77)
Platelets: 183 10*3/uL (ref 150–400)
RBC: 3.57 MIL/uL — ABNORMAL LOW (ref 3.87–5.11)
RDW: 13.2 % (ref 11.5–15.5)
WBC: 4.2 10*3/uL (ref 4.0–10.5)

## 2013-07-15 LAB — BASIC METABOLIC PANEL
BUN: 66 mg/dL — ABNORMAL HIGH (ref 6–23)
CALCIUM: 8.2 mg/dL — AB (ref 8.4–10.5)
CHLORIDE: 97 meq/L (ref 96–112)
CO2: 19 mEq/L (ref 19–32)
CREATININE: 7.45 mg/dL — AB (ref 0.50–1.10)
GFR calc non Af Amer: 6 mL/min — ABNORMAL LOW (ref >90)
GFR, EST AFRICAN AMERICAN: 6 mL/min — AB (ref >90)
Glucose, Bld: 230 mg/dL — ABNORMAL HIGH (ref 70–99)
Potassium: 4.3 mEq/L (ref 3.7–5.3)
Sodium: 135 mEq/L — ABNORMAL LOW (ref 137–147)

## 2013-07-15 LAB — GLUCOSE, CAPILLARY
GLUCOSE-CAPILLARY: 244 mg/dL — AB (ref 70–99)
GLUCOSE-CAPILLARY: 262 mg/dL — AB (ref 70–99)
Glucose-Capillary: 246 mg/dL — ABNORMAL HIGH (ref 70–99)
Glucose-Capillary: 175 mg/dL — ABNORMAL HIGH (ref 70–99)
Glucose-Capillary: 260 mg/dL — ABNORMAL HIGH (ref 70–99)
Glucose-Capillary: 271 mg/dL — ABNORMAL HIGH (ref 70–99)

## 2013-07-15 MED ORDER — FUROSEMIDE 80 MG PO TABS
80.0000 mg | ORAL_TABLET | Freq: Every day | ORAL | Status: DC
  Administered 2013-07-16 – 2013-07-17 (×2): 80 mg via ORAL
  Filled 2013-07-15 (×3): qty 1

## 2013-07-15 MED ORDER — POLYETHYLENE GLYCOL 3350 17 G PO PACK
17.0000 g | PACK | Freq: Every day | ORAL | Status: DC
  Administered 2013-07-19 – 2013-07-21 (×3): 17 g via ORAL
  Filled 2013-07-15 (×12): qty 1

## 2013-07-15 MED ORDER — ONDANSETRON HCL 4 MG/2ML IJ SOLN
4.0000 mg | Freq: Four times a day (QID) | INTRAMUSCULAR | Status: DC | PRN
  Administered 2013-07-24: 4 mg via INTRAVENOUS
  Filled 2013-07-15 (×2): qty 2

## 2013-07-15 MED ORDER — CEFAZOLIN SODIUM 1-5 GM-% IV SOLN
1.0000 g | Freq: Once | INTRAVENOUS | Status: AC
  Administered 2013-07-16: 1 g via INTRAVENOUS
  Filled 2013-07-15: qty 50

## 2013-07-15 MED ORDER — BISACODYL 10 MG RE SUPP: 10.0000 mg | Freq: Every day | RECTAL | Status: DC | PRN

## 2013-07-15 MED ORDER — LORAZEPAM 2 MG/ML IJ SOLN
INTRAMUSCULAR | Status: AC
  Filled 2013-07-15: qty 1

## 2013-07-15 MED ORDER — ZOLPIDEM TARTRATE 5 MG PO TABS
5.0000 mg | ORAL_TABLET | Freq: Every evening | ORAL | Status: DC | PRN
  Administered 2013-07-15 – 2013-07-16 (×2): 5 mg via ORAL
  Filled 2013-07-15 (×2): qty 1

## 2013-07-15 MED ORDER — INSULIN ASPART 100 UNIT/ML ~~LOC~~ SOLN
0.0000 [IU] | Freq: Two times a day (BID) | SUBCUTANEOUS | Status: DC
Start: 2013-07-15 — End: 2013-07-18
  Administered 2013-07-15 (×2): 5 [IU] via SUBCUTANEOUS
  Administered 2013-07-16: 2 [IU] via SUBCUTANEOUS
  Administered 2013-07-17: 5 [IU] via SUBCUTANEOUS
  Administered 2013-07-17: 7 [IU] via SUBCUTANEOUS

## 2013-07-15 MED ORDER — LORAZEPAM 2 MG/ML IJ SOLN
0.5000 mg | Freq: Four times a day (QID) | INTRAMUSCULAR | Status: DC | PRN
  Administered 2013-07-15: 0.5 mg via INTRAVENOUS

## 2013-07-15 MED ORDER — DARBEPOETIN ALFA-POLYSORBATE 60 MCG/0.3ML IJ SOLN
60.0000 ug | INTRAMUSCULAR | Status: DC
  Administered 2013-07-15 – 2013-07-22 (×2): 60 ug via SUBCUTANEOUS
  Filled 2013-07-15 (×3): qty 0.3

## 2013-07-15 NOTE — Progress Notes (Addendum)
[facility] ROUNDING NOTE  Summary Impression  53 yo WF with longstanding DM and HTN, both poorly controlled for at least a year, with CKD present as far back as 2013 with creatinine of 2.5 at that time, 3 in early 2014, and 5.3 now - likely representing progression of underlying disease and not likely reversible (likely to worsen as blood pressure is brought down) Ultrasound reveals bilaterally small kidneys. UPC 14 grams proteinuria.  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 Has progressed in house and will require initiation of dialysis  1. CKD5 - DM+HTN. [place] bilat small kidneys.  UPC 14 gms prot. SPEP still pending. vein mapping done; for right brachiocephalic AVF on Tuesday.  Creatinine is still rising (I suspect in part due to controlling BP in setting of impaired autoregulation) and pt now having frequent nausea.  Will be getting AVF on Tuesday - will ask for [facility] at that time (I spoke with Dr. Arbie Cookey and he can place catheter tomorrow AM/start HD after - greatly appreciated)  Start CLIP process. Zofran for nausea 2. Bones - PTH still pending. 25 Vit D quite low;  Replete 50,000 twice a week 3. DM - per primary service.  4. Uncontrolled/accelerated HTN - Improved.  NO ACE or ARB with this advanced CKD; . Continue current furosemide 80/day.(reordered as Dr. Kevan Ny pointed out not on [facility]) 5. SOB - PNA +/-CHF; no overt pulm edema on repeat CXR; echo normal EF; grade 1 diastolic dysf; repeat CXR basilar atelectasis and no edema.  PND/orthopnea much better! ATB's changed to levaquin. For repeat CXR 6. Anemia - iron deficient by studies; Feraheme X 1was given; Hb has now dropped to <10-. Start Aranesp. 7. Asthma/seasonal allergy - albuterol 8. Metabolic acidosis - oral bicarb until starts HD 9. CAP - atb's per primary service   Subjective:  Nausea has recurred and is more  frequent. Does not feel well today Not SOB  Objective Vital signs in last 24 hours: Filed Vitals:   07/14/13 1951 07/14/13 2151 07/15/13 0340 07/15/13 0922  BP: 171/77  138/74 172/84  Pulse: 65  64 65  Temp: 98.9 F (37.2 C)  98.7 F (37.1 C) 98.5 F (36.9 C)  TempSrc:    Oral  Resp: 18  18 18   Height:      Weight:  85.1 kg (187 lb 9.8 oz)    SpO2: 97%  96% 95%   Weight change:   Intake/Output Summary (Last 24 hours) at 07/15/13 1322 Last data filed at 07/15/13 0923  Gross per 24 hour  Intake    480 ml  Output      0 ml  Net    480 ml   Physical Exam:  Blood pressure 205/77, pulse 71, temperature 98.5 F (36.9 C), temperature source Oral, resp. rate 15, height 5\' 1"  (1.549 m), weight 84.6 kg (186 lb 8.2 oz), SpO2 96.00%. Obese WF NAD Not on O2 NAD Lungs clear Normal S1S2 No S3 or pericardial rub No pericardial rub No LE edema   Recent Labs Lab 07/10/13 1913 07/10/13 2113 07/11/13 0340 07/12/13 0245 07/13/13 0319 07/14/13 0552 07/15/13 0528  NA 133* 134* 133*  132* 132* 132* 134* 135*  K 4.4 4.5 4.5  4.4 4.4 4.0 4.3 4.3  CL 98 97 98  97 96 96 96 97  CO2 17* 17* 17*  16*  17* 18* 19 19  GLUCOSE 128* 141* 115*  112* 83 107* 151* 230*  BUN 63* 61* 64*  64* 65* 65* 67* 66*  CREATININE 5.26* 5.31* 5.44*  5.35* 5.79* 6.36* 6.84* 7.45*  CALCIUM 9.0 8.6 8.9  8.8 9.1 8.5 8.4 8.2*  PHOS  --   --  5.0* 6.0* 6.0* 6.5*  --    PTH pending  Recent Labs Lab 07/10/13 1330  07/12/13 0245 07/13/13 0319 07/14/13 0552  AST 12  --   --   --   --   ALT 17  --   --   --   --   ALKPHOS 130*  --   --   --   --   BILITOT <0.2*  --   --   --   --   PROT 6.1  --   --   --   --   ALBUMIN 2.6*  < > 2.5* 2.3* 2.6*  < > = values in this interval not displayed.  Recent Labs Lab 07/10/13 1330 07/11/13 0340 07/12/13 0245 07/15/13 0528  WBC 5.5 5.9 5.8 4.2  NEUTROABS 4.1  --  3.4 2.6  HGB 11.2* 10.9* 10.8* 9.5*  HCT 32.8* 31.6* 31.6* 28.3*  MCV 77.7* 77.3* 78.6  79.3  PLT 186 215 228 183    Recent Labs Lab 07/14/13 1954 07/14/13 2324 07/15/13 0339 07/15/13 0823 07/15/13 1147  GLUCAP 190* 200* 246* 175* 260*    Recent Labs Lab 07/11/13 0340  IRON 14*  TIBC 231*  FERRITIN 117   Vit D, 25-Hydroxy  12 (*) 30 - 89 ng/mL  PTH pending  Studies/Results: Dg Chest 2 View  07/14/2013   CLINICAL DATA:  Hypertension, diabetes, left base atelectasis  EXAM: CHEST  2 VIEW  COMPARISON:  07/12/2013  FINDINGS: Normal heart size and vascularity. No focal pneumonia, collapse or consolidation. Resolved left base atelectasis. Trace left effusion noted, appreciated on the lateral view. No pneumothorax. Trachea is midline.  IMPRESSION: Trace left pleural effusion.  No other acute finding.   Electronically Signed   By: Ruel Favorsrevor  Shick M.D.   On: 07/14/2013 09:41   ECHO EF 60-65% Grade 1 diastolic dysfunction  Medications: . sodium chloride     . amLODipine  10 mg Oral Daily  . atenolol  50 mg Oral BID  . cloNIDine  0.1 mg Oral BID  . enoxaparin (LOVENOX) injection  30 mg Subcutaneous Q24H  . furosemide  80 mg Oral Daily  . insulin aspart  0-9 Units Subcutaneous BID  . insulin glargine  5 Units Subcutaneous QHS  . pantoprazole  40 mg Oral Daily  . sodium bicarbonate  650 mg Oral TID  . Vitamin D (Ergocalciferol)  50,000 Units Oral Once per day on Mon Thu     Camille Balynthia Tinlee Navarrette, MD [facility] [phone] Pager 07/15/2013, 1:22 PM

## 2013-07-15 NOTE — Progress Notes (Signed)
Pt c/o pain 5/10 in abdomen. Too nauseous to take PO tylenol. Dr. Kevan NyGates paged. Immediate call back. Orders received for ativan IV, and portable 3 way abdominal xray to r/o obstruction. Will Continue to monitor. Hortencia ConradiWendi Linlee Cromie, RN

## 2013-07-15 NOTE — Progress Notes (Signed)
Subjective: Feeling better overall but did start to cry today when thinking about what her overall clinical situation is in terms of her renal failure et Ronney Asters.  She understands she needs to be in the hospital to follow her kidney function acutely.  Dialysis may be necessary on Monday or Tuesday. Chest x-ray was clear and will stop antibiotics for pneumonia. She is having trouble sleeping at night and we'll try low-dose Ambien as needed. We'll use low-dose alprazolam for anxiety.  May be beneficial to start antidepressant and we'll further discuss this with her in a.m.  Objective: Weight change:   Intake/Output Summary (Last 24 hours) at 07/15/13 1035 Last data filed at 07/15/13 0923  Gross per 24 hour  Intake    480 ml  Output      0 ml  Net    480 ml   Filed Vitals:   07/14/13 1951 07/14/13 2151 07/15/13 0340 07/15/13 0922  BP: 171/77  138/74 172/84  Pulse: 65  64 65  Temp: 98.9 F (37.2 C)  98.7 F (37.1 C) 98.5 F (36.9 C)  TempSrc:    Oral  Resp: 18  18 18   Height:      Weight:  187 lb 9.8 oz (85.1 kg)    SpO2: 97%  96% 95%    General Appearance: Alert, cooperative, no distress, appears stated age Lungs: Clear to auscultation bilaterally, respirations unlabored Heart: Regular rate and rhythm, S1 and S2 normal, no murmur, rub or gallop Abdomen: Soft, non-tender, bowel sounds active all four quadrants, no masses, no organomegaly Extremities: Extremities normal, atraumatic, no cyanosis or edema Neuro: Oriented x3, nonfocal  Lab Results: Results for orders placed during the hospital encounter of 07/10/13 (from the past 48 hour(s))  GLUCOSE, CAPILLARY     Status: Abnormal   Collection Time    07/13/13 12:02 PM      Result Value Ref Range   Glucose-Capillary 158 (*) 70 - 99 mg/dL  GLUCOSE, CAPILLARY     Status: Abnormal   Collection Time    07/13/13  3:38 PM      Result Value Ref Range   Glucose-Capillary 202 (*) 70 - 99 mg/dL  GLUCOSE, CAPILLARY     Status: Abnormal    Collection Time    07/13/13  8:03 PM      Result Value Ref Range   Glucose-Capillary 214 (*) 70 - 99 mg/dL  GLUCOSE, CAPILLARY     Status: Abnormal   Collection Time    07/13/13 11:32 PM      Result Value Ref Range   Glucose-Capillary 137 (*) 70 - 99 mg/dL  GLUCOSE, CAPILLARY     Status: Abnormal   Collection Time    07/14/13  3:18 AM      Result Value Ref Range   Glucose-Capillary 116 (*) 70 - 99 mg/dL  RENAL FUNCTION PANEL     Status: Abnormal   Collection Time    07/14/13  5:52 AM      Result Value Ref Range   Sodium 134 (*) 137 - 147 mEq/L   Potassium 4.3  3.7 - 5.3 mEq/L   Chloride 96  96 - 112 mEq/L   CO2 19  19 - 32 mEq/L   Glucose, Bld 151 (*) 70 - 99 mg/dL   BUN 67 (*) 6 - 23 mg/dL   Creatinine, Ser 6.84 (*) 0.50 - 1.10 mg/dL   Calcium 8.4  8.4 - 10.5 mg/dL   Phosphorus 6.5 (*) 2.3 - 4.6 mg/dL  Albumin 2.6 (*) 3.5 - 5.2 g/dL   GFR calc non Af Amer 6 (*) >90 mL/min   GFR calc Af Amer 7 (*) >90 mL/min   Comment: (NOTE)     The eGFR has been calculated using the CKD EPI equation.     This calculation has not been validated in all clinical situations.     eGFR's persistently <90 mL/min signify possible Chronic Kidney     Disease.  GLUCOSE, CAPILLARY     Status: Abnormal   Collection Time    07/14/13  8:22 AM      Result Value Ref Range   Glucose-Capillary 173 (*) 70 - 99 mg/dL  GLUCOSE, CAPILLARY     Status: Abnormal   Collection Time    07/14/13 11:56 AM      Result Value Ref Range   Glucose-Capillary 251 (*) 70 - 99 mg/dL  GLUCOSE, CAPILLARY     Status: Abnormal   Collection Time    07/14/13  4:05 PM      Result Value Ref Range   Glucose-Capillary 143 (*) 70 - 99 mg/dL  GLUCOSE, CAPILLARY     Status: Abnormal   Collection Time    07/14/13  7:54 PM      Result Value Ref Range   Glucose-Capillary 190 (*) 70 - 99 mg/dL  GLUCOSE, CAPILLARY     Status: Abnormal   Collection Time    07/14/13 11:24 PM      Result Value Ref Range   Glucose-Capillary 200  (*) 70 - 99 mg/dL  GLUCOSE, CAPILLARY     Status: Abnormal   Collection Time    07/15/13  3:39 AM      Result Value Ref Range   Glucose-Capillary 246 (*) 70 - 99 mg/dL  CBC WITH DIFFERENTIAL     Status: Abnormal   Collection Time    07/15/13  5:28 AM      Result Value Ref Range   WBC 4.2  4.0 - 10.5 K/uL   RBC 3.57 (*) 3.87 - 5.11 MIL/uL   Hemoglobin 9.5 (*) 12.0 - 15.0 g/dL   HCT 28.3 (*) 36.0 - 46.0 %   MCV 79.3  78.0 - 100.0 fL   MCH 26.6  26.0 - 34.0 pg   MCHC 33.6  30.0 - 36.0 g/dL   RDW 13.2  11.5 - 15.5 %   Platelets 183  150 - 400 K/uL   Neutrophils Relative % 61  43 - 77 %   Neutro Abs 2.6  1.7 - 7.7 K/uL   Lymphocytes Relative 28  12 - 46 %   Lymphs Abs 1.2  0.7 - 4.0 K/uL   Monocytes Relative 9  3 - 12 %   Monocytes Absolute 0.4  0.1 - 1.0 K/uL   Eosinophils Relative 2  0 - 5 %   Eosinophils Absolute 0.1  0.0 - 0.7 K/uL   Basophils Relative 0  0 - 1 %   Basophils Absolute 0.0  0.0 - 0.1 K/uL  BASIC METABOLIC PANEL     Status: Abnormal   Collection Time    07/15/13  5:28 AM      Result Value Ref Range   Sodium 135 (*) 137 - 147 mEq/L   Potassium 4.3  3.7 - 5.3 mEq/L   Chloride 97  96 - 112 mEq/L   CO2 19  19 - 32 mEq/L   Glucose, Bld 230 (*) 70 - 99 mg/dL   BUN 66 (*) 6 - 23  mg/dL   Creatinine, Ser 7.45 (*) 0.50 - 1.10 mg/dL   Calcium 8.2 (*) 8.4 - 10.5 mg/dL   GFR calc non Af Amer 6 (*) >90 mL/min   GFR calc Af Amer 6 (*) >90 mL/min   Comment: (NOTE)     The eGFR has been calculated using the CKD EPI equation.     This calculation has not been validated in all clinical situations.     eGFR's persistently <90 mL/min signify possible Chronic Kidney     Disease.  GLUCOSE, CAPILLARY     Status: Abnormal   Collection Time    07/15/13  8:23 AM      Result Value Ref Range   Glucose-Capillary 175 (*) 70 - 99 mg/dL    Studies/Results: Dg Chest 2 View  07/14/2013   CLINICAL DATA:  Hypertension, diabetes, left base atelectasis  EXAM: CHEST  2 VIEW  COMPARISON:   07/12/2013  FINDINGS: Normal heart size and vascularity. No focal pneumonia, collapse or consolidation. Resolved left base atelectasis. Trace left effusion noted, appreciated on the lateral view. No pneumothorax. Trachea is midline.  IMPRESSION: Trace left pleural effusion.  No other acute finding.   Electronically Signed   By: Daryll Brod M.D.   On: 07/14/2013 09:41   Medications: Scheduled Meds: . amLODipine  10 mg Oral Daily  . atenolol  50 mg Oral BID  . cloNIDine  0.1 mg Oral BID  . enoxaparin (LOVENOX) injection  30 mg Subcutaneous Q24H  . ferrous gluconate  324 mg Oral QHS  . ferrous gluconate  648 mg Oral Daily  . insulin aspart  0-9 Units Subcutaneous BID  . insulin glargine  5 Units Subcutaneous QHS  . pantoprazole  40 mg Oral Daily  . sodium bicarbonate  650 mg Oral TID  . Vitamin D (Ergocalciferol)  50,000 Units Oral Once per day on Mon Thu   Continuous Infusions: . sodium chloride     PRN Meds:.acetaminophen, acetaminophen, albuterol, dextrose, HYDROcodone-acetaminophen, HYDROmorphone (DILAUDID) injection, ondansetron (ZOFRAN) IV, ondansetron, promethazine, zolpidem  Assessment/Plan: Principal Problem:   Acute renal failure Active Problems:   Hyperkalemia   DKA (diabetic ketoacidoses)   Malignant hypertension   Chronic kidney disease, stage IV (severe)   Noncompliance   Anemia   Dyspnea  Principal Problem:  Acute renal failure - renal function continues to decline daily. Will certainly need dialysis.  Continue to follow creatinine over the next few days  Active Problems:  Hyperkalemia - controlled  DKA (diabetic ketoacidoses) - resolved - we'll change CBG checks to twice daily, before breakfast and supper - well-controlled Malignant hypertension - improved - off of hydralazine and now on clonidine, beta blocker, and calcium channel blocker - patient does not have Lasix ordered currently, we'll defer to nephrology to decide about that dose daily  Chronic kidney  disease, stage V (severe) - as per nephrology, appreciate nephrology's help  Noncompliance - aware, will likely be much easier if she can get covered by Medicaid  Anemia - not severe  Dyspnea - improved  CAP - chest x-ray completely clear.  We'll discontinue antibiotic therapy.  May not have been a pneumonia but just atelectasis Insomnia - low-dose Ambien as needed Mardene Celeste anxiety - alprazolam as needed Disposition - continue to trend creatinine over the next several days and will likely need dialysis in the next several days    LOS: 5 days   Henrine Screws, MD 07/15/2013, 10:35 AM

## 2013-07-16 ENCOUNTER — Inpatient Hospital Stay (HOSPITAL_COMMUNITY): Payer: Medicaid Other | Admitting: Anesthesiology

## 2013-07-16 ENCOUNTER — Inpatient Hospital Stay (HOSPITAL_COMMUNITY): Payer: Medicaid Other

## 2013-07-16 ENCOUNTER — Encounter (HOSPITAL_COMMUNITY)
Admission: EM | Disposition: A | Discharge status: Home / Self Care | Payer: Self-pay | Source: Home / Self Care | Attending: Internal Medicine

## 2013-07-16 ENCOUNTER — Encounter (HOSPITAL_COMMUNITY): Payer: Medicaid Other | Admitting: Anesthesiology

## 2013-07-16 ENCOUNTER — Encounter (HOSPITAL_COMMUNITY): Payer: Self-pay | Admitting: Anesthesiology

## 2013-07-16 DIAGNOSIS — N185 Chronic kidney disease, stage 5: Secondary | ICD-10-CM

## 2013-07-16 HISTORY — PX: INSERTION OF DIALYSIS CATHETER: SHX1324

## 2013-07-16 LAB — GLUCOSE, CAPILLARY
GLUCOSE-CAPILLARY: 208 mg/dL — AB (ref 70–99)
GLUCOSE-CAPILLARY: 182 mg/dL — AB (ref 70–99)
GLUCOSE-CAPILLARY: 178 mg/dL — AB (ref 70–99)
GLUCOSE-CAPILLARY: 194 mg/dL — AB (ref 70–99)
Glucose-Capillary: 234 mg/dL — ABNORMAL HIGH (ref 70–99)
Glucose-Capillary: 208 mg/dL — ABNORMAL HIGH (ref 70–99)

## 2013-07-16 LAB — RENAL FUNCTION PANEL
Albumin: 2.6 g/dL — ABNORMAL LOW (ref 3.5–5.2)
BUN: 72 mg/dL — AB (ref 6–23)
CHLORIDE: 96 meq/L (ref 96–112)
CO2: 19 mEq/L (ref 19–32)
CREATININE: 7.57 mg/dL — AB (ref 0.50–1.10)
Calcium: 8.7 mg/dL (ref 8.4–10.5)
GFR calc Af Amer: 6 mL/min — ABNORMAL LOW (ref >90)
GFR calc non Af Amer: 5 mL/min — ABNORMAL LOW (ref >90)
Glucose, Bld: 219 mg/dL — ABNORMAL HIGH (ref 70–99)
POTASSIUM: 4.2 meq/L (ref 3.7–5.3)
Phosphorus: 6.8 mg/dL — ABNORMAL HIGH (ref 2.3–4.6)
Sodium: 134 mEq/L — ABNORMAL LOW (ref 137–147)

## 2013-07-16 LAB — SURGICAL PCR SCREEN
MRSA, PCR: NEGATIVE
Staphylococcus aureus: NEGATIVE

## 2013-07-16 SURGERY — INSERTION OF DIALYSIS CATHETER
Anesthesia: Monitor Anesthesia Care

## 2013-07-16 MED ORDER — LIDOCAINE-EPINEPHRINE 0.5 %-1:200000 IJ SOLN
INTRAMUSCULAR | Status: DC | PRN
  Administered 2013-07-16: 10 mL

## 2013-07-16 MED ORDER — SODIUM CHLORIDE 0.9 % IR SOLN
Status: DC | PRN
  Administered 2013-07-16: 09:00:00

## 2013-07-16 MED ORDER — ONDANSETRON HCL 4 MG/2ML IJ SOLN
INTRAMUSCULAR | Status: AC
  Administered 2013-07-16: 4 mg via INTRAVENOUS
  Filled 2013-07-16: qty 2

## 2013-07-16 MED ORDER — ONDANSETRON HCL 4 MG/2ML IJ SOLN
INTRAMUSCULAR | Status: DC | PRN
  Administered 2013-07-16: 4 mg via INTRAVENOUS

## 2013-07-16 MED ORDER — ONDANSETRON HCL 4 MG/2ML IJ SOLN
INTRAMUSCULAR | Status: AC
  Filled 2013-07-16: qty 2

## 2013-07-16 MED ORDER — PHENYLEPHRINE 40 MCG/ML (10ML) SYRINGE FOR IV PUSH (FOR BLOOD PRESSURE SUPPORT)
PREFILLED_SYRINGE | INTRAVENOUS | Status: AC
  Filled 2013-07-16: qty 10

## 2013-07-16 MED ORDER — OXYCODONE HCL 5 MG PO TABS
5.0000 mg | ORAL_TABLET | Freq: Once | ORAL | Status: AC | PRN
  Administered 2013-07-16: 5 mg via ORAL

## 2013-07-16 MED ORDER — GLYCOPYRROLATE 0.2 MG/ML IJ SOLN
INTRAMUSCULAR | Status: AC
  Filled 2013-07-16: qty 1

## 2013-07-16 MED ORDER — HEPARIN SODIUM (PORCINE) 1000 UNIT/ML IJ SOLN
INTRAMUSCULAR | Status: DC | PRN
  Administered 2013-07-16: 5 mL via INTRAVENOUS

## 2013-07-16 MED ORDER — SUCCINYLCHOLINE CHLORIDE 20 MG/ML IJ SOLN
INTRAMUSCULAR | Status: AC
  Filled 2013-07-16: qty 1

## 2013-07-16 MED ORDER — LIDOCAINE HCL (CARDIAC) 20 MG/ML IV SOLN
INTRAVENOUS | Status: AC
  Filled 2013-07-16: qty 5

## 2013-07-16 MED ORDER — MIDAZOLAM HCL 5 MG/5ML IJ SOLN
INTRAMUSCULAR | Status: DC | PRN
  Administered 2013-07-16 (×2): 1 mg via INTRAVENOUS

## 2013-07-16 MED ORDER — ROCURONIUM BROMIDE 50 MG/5ML IV SOLN
INTRAVENOUS | Status: AC
  Filled 2013-07-16: qty 1

## 2013-07-16 MED ORDER — MIDAZOLAM HCL 2 MG/2ML IJ SOLN
INTRAMUSCULAR | Status: AC
  Filled 2013-07-16: qty 2

## 2013-07-16 MED ORDER — SODIUM CHLORIDE 0.9 % IV SOLN
INTRAVENOUS | Status: DC | PRN
  Administered 2013-07-16: 07:00:00 via INTRAVENOUS

## 2013-07-16 MED ORDER — PROPOFOL 10 MG/ML IV BOLUS
INTRAVENOUS | Status: AC
  Filled 2013-07-16: qty 20

## 2013-07-16 MED ORDER — ONDANSETRON HCL 4 MG/2ML IJ SOLN
4.0000 mg | Freq: Once | INTRAMUSCULAR | Status: AC | PRN
Start: 2013-07-16 — End: 2013-07-16
  Administered 2013-07-16: 4 mg via INTRAVENOUS

## 2013-07-16 MED ORDER — LIDOCAINE HCL (CARDIAC) 20 MG/ML IV SOLN
INTRAVENOUS | Status: DC | PRN
  Administered 2013-07-16: 50 mg via INTRAVENOUS

## 2013-07-16 MED ORDER — OXYCODONE HCL 5 MG PO TABS
ORAL_TABLET | ORAL | Status: AC
  Administered 2013-07-16: 5 mg via ORAL
  Filled 2013-07-16: qty 1

## 2013-07-16 MED ORDER — HEPARIN SODIUM (PORCINE) 1000 UNIT/ML IJ SOLN
INTRAMUSCULAR | Status: AC
  Filled 2013-07-16: qty 1

## 2013-07-16 MED ORDER — PROPOFOL 10 MG/ML IV BOLUS
INTRAVENOUS | Status: DC | PRN
  Administered 2013-07-16: 10 mg via INTRAVENOUS
  Administered 2013-07-16: 20 mg via INTRAVENOUS
  Administered 2013-07-16 (×5): 10 mg via INTRAVENOUS
  Administered 2013-07-16: 20 mg via INTRAVENOUS

## 2013-07-16 MED ORDER — FENTANYL CITRATE 0.05 MG/ML IJ SOLN
25.0000 ug | INTRAMUSCULAR | Status: DC | PRN
  Administered 2013-07-16: 25 ug via INTRAVENOUS

## 2013-07-16 MED ORDER — FENTANYL CITRATE 0.05 MG/ML IJ SOLN
INTRAMUSCULAR | Status: AC
  Filled 2013-07-16: qty 5

## 2013-07-16 MED ORDER — OXYCODONE HCL 5 MG/5ML PO SOLN: 5.0000 mg | Freq: Once | ORAL | Status: AC | PRN

## 2013-07-16 MED ORDER — EPHEDRINE SULFATE 50 MG/ML IJ SOLN
INTRAMUSCULAR | Status: AC
  Filled 2013-07-16: qty 1

## 2013-07-16 MED ORDER — SODIUM CHLORIDE 0.9 % IJ SOLN
INTRAMUSCULAR | Status: AC
  Filled 2013-07-16: qty 10

## 2013-07-16 MED ORDER — FENTANYL CITRATE 0.05 MG/ML IJ SOLN
INTRAMUSCULAR | Status: AC
  Filled 2013-07-16: qty 2

## 2013-07-16 SURGICAL SUPPLY — 41 items
BAG DECANTER FOR FLEXI CONT (MISCELLANEOUS) ×3 IMPLANT
CATH CANNON HEMO 15F 50CM (CATHETERS) IMPLANT
CATH CANNON HEMO 15FR 19 (HEMODIALYSIS SUPPLIES) IMPLANT
CATH CANNON HEMO 15FR 23CM (HEMODIALYSIS SUPPLIES) ×2 IMPLANT
CATH CANNON HEMO 15FR 31CM (HEMODIALYSIS SUPPLIES) IMPLANT
CATH CANNON HEMO 15FR 32 (HEMODIALYSIS SUPPLIES) IMPLANT
CATH CANNON HEMO 15FR 32CM (HEMODIALYSIS SUPPLIES) IMPLANT
COVER PROBE W GEL 5X96 (DRAPES) ×2 IMPLANT
COVER SURGICAL LIGHT HANDLE (MISCELLANEOUS) ×3 IMPLANT
DECANTER SPIKE VIAL GLASS SM (MISCELLANEOUS) ×3 IMPLANT
DRAPE C-ARM 42X72 X-RAY (DRAPES) ×3 IMPLANT
DRAPE CHEST BREAST 15X10 FENES (DRAPES) ×3 IMPLANT
DRSG TEGADERM 4X4.75 (GAUZE/BANDAGES/DRESSINGS) ×2 IMPLANT
GAUZE SPONGE 2X2 8PLY STRL LF (GAUZE/BANDAGES/DRESSINGS) ×1 IMPLANT
GAUZE SPONGE 4X4 16PLY XRAY LF (GAUZE/BANDAGES/DRESSINGS) ×3 IMPLANT
GLOVE SS BIOGEL STRL SZ 7.5 (GLOVE) ×1 IMPLANT
GLOVE SUPERSENSE BIOGEL SZ 7.5 (GLOVE) ×2
GOWN STRL REUS W/ TWL LRG LVL3 (GOWN DISPOSABLE) ×3 IMPLANT
GOWN STRL REUS W/TWL LRG LVL3 (GOWN DISPOSABLE) ×9
KIT BASIN OR (CUSTOM PROCEDURE TRAY) ×3 IMPLANT
KIT ROOM TURNOVER OR (KITS) ×3 IMPLANT
NDL 18GX1X1/2 (RX/OR ONLY) (NEEDLE) ×1 IMPLANT
NDL HYPO 25GX1X1/2 BEV (NEEDLE) ×1 IMPLANT
NEEDLE 18GX1X1/2 (RX/OR ONLY) (NEEDLE) ×3 IMPLANT
NEEDLE 22X1 1/2 (OR ONLY) (NEEDLE) ×3 IMPLANT
NEEDLE HYPO 25GX1X1/2 BEV (NEEDLE) ×3 IMPLANT
NS IRRIG 1000ML POUR BTL (IV SOLUTION) ×3 IMPLANT
PACK SURGICAL SETUP 50X90 (CUSTOM PROCEDURE TRAY) ×3 IMPLANT
PAD ARMBOARD 7.5X6 YLW CONV (MISCELLANEOUS) ×6 IMPLANT
SOAP 2 % CHG 4 OZ (WOUND CARE) ×3 IMPLANT
SPONGE GAUZE 2X2 STER 10/PKG (GAUZE/BANDAGES/DRESSINGS) ×2
SUT ETHILON 3 0 PS 1 (SUTURE) ×3 IMPLANT
SUT VICRYL 4-0 PS2 18IN ABS (SUTURE) ×3 IMPLANT
SYR 20CC LL (SYRINGE) ×3 IMPLANT
SYR 30ML LL (SYRINGE) IMPLANT
SYR 5ML LL (SYRINGE) ×6 IMPLANT
SYR CONTROL 10ML LL (SYRINGE) ×3 IMPLANT
SYRINGE 10CC LL (SYRINGE) ×3 IMPLANT
TOWEL OR 17X24 6PK STRL BLUE (TOWEL DISPOSABLE) ×3 IMPLANT
TOWEL OR 17X26 10 PK STRL BLUE (TOWEL DISPOSABLE) ×3 IMPLANT
WATER STERILE IRR 1000ML POUR (IV SOLUTION) ×1 IMPLANT

## 2013-07-16 NOTE — Progress Notes (Signed)
Pt back from OR, diatek placed in right chest, incision to right neck with liquid bandaid. VSS. Denies pain at this time.  PT drowsy but arousable. Tele # [phone] called tele advised pt was back.

## 2013-07-16 NOTE — Progress Notes (Addendum)
Subjective: Patient with nausea and vomiting yesterday.  Abdominal x-ray benign.  No obstruction.  Patient is off ward for right IJ dialysis catheter placement per Dr. Sherren Mocha early this morning.  Vital signs are stable afebrile.  Has completed therapy for pneumonia.  Was tearful yesterday and probably should be started on an antidepressant.  We'll try escitalopram 5 milligrams daily to start.  Please alprazolam for anxiety and Ambien for insomnia  Objective: Weight change: 7.1 oz (0.2 kg)  Intake/Output Summary (Last 24 hours) at 07/16/13 0825 Last data filed at 07/16/13 0752  Gross per 24 hour  Intake    800 ml  Output      0 ml  Net    800 ml   Filed Vitals:   07/15/13 1924 07/15/13 1927 07/16/13 0406 07/16/13 0821  BP: 170/83  148/74   Pulse: 71  71   Temp: 98.5 F (36.9 C)  99 F (37.2 C) 98.6 F (37 C)  TempSrc:      Resp: 18  18   Height:      Weight:  188 lb 15 oz (85.7 kg)    SpO2: 98%  95%     Lab Results: Results for orders placed during the hospital encounter of 07/10/13 (from the past 48 hour(s))  GLUCOSE, CAPILLARY     Status: Abnormal   Collection Time    07/14/13 11:56 AM      Result Value Ref Range   Glucose-Capillary 251 (*) 70 - 99 mg/dL  GLUCOSE, CAPILLARY     Status: Abnormal   Collection Time    07/14/13  4:05 PM      Result Value Ref Range   Glucose-Capillary 143 (*) 70 - 99 mg/dL  GLUCOSE, CAPILLARY     Status: Abnormal   Collection Time    07/14/13  7:54 PM      Result Value Ref Range   Glucose-Capillary 190 (*) 70 - 99 mg/dL  GLUCOSE, CAPILLARY     Status: Abnormal   Collection Time    07/14/13 11:24 PM      Result Value Ref Range   Glucose-Capillary 200 (*) 70 - 99 mg/dL  GLUCOSE, CAPILLARY     Status: Abnormal   Collection Time    07/15/13  3:39 AM      Result Value Ref Range   Glucose-Capillary 246 (*) 70 - 99 mg/dL  CBC WITH DIFFERENTIAL     Status: Abnormal   Collection Time    07/15/13  5:28 AM      Result Value Ref Range   WBC  4.2  4.0 - 10.5 K/uL   RBC 3.57 (*) 3.87 - 5.11 MIL/uL   Hemoglobin 9.5 (*) 12.0 - 15.0 g/dL   HCT 28.3 (*) 36.0 - 46.0 %   MCV 79.3  78.0 - 100.0 fL   MCH 26.6  26.0 - 34.0 pg   MCHC 33.6  30.0 - 36.0 g/dL   RDW 13.2  11.5 - 15.5 %   Platelets 183  150 - 400 K/uL   Neutrophils Relative % 61  43 - 77 %   Neutro Abs 2.6  1.7 - 7.7 K/uL   Lymphocytes Relative 28  12 - 46 %   Lymphs Abs 1.2  0.7 - 4.0 K/uL   Monocytes Relative 9  3 - 12 %   Monocytes Absolute 0.4  0.1 - 1.0 K/uL   Eosinophils Relative 2  0 - 5 %   Eosinophils Absolute 0.1  0.0 - 0.7  K/uL   Basophils Relative 0  0 - 1 %   Basophils Absolute 0.0  0.0 - 0.1 K/uL  BASIC METABOLIC PANEL     Status: Abnormal   Collection Time    07/15/13  5:28 AM      Result Value Ref Range   Sodium 135 (*) 137 - 147 mEq/L   Potassium 4.3  3.7 - 5.3 mEq/L   Chloride 97  96 - 112 mEq/L   CO2 19  19 - 32 mEq/L   Glucose, Bld 230 (*) 70 - 99 mg/dL   BUN 66 (*) 6 - 23 mg/dL   Creatinine, Ser 7.45 (*) 0.50 - 1.10 mg/dL   Calcium 8.2 (*) 8.4 - 10.5 mg/dL   GFR calc non Af Amer 6 (*) >90 mL/min   GFR calc Af Amer 6 (*) >90 mL/min   Comment: (NOTE)     The eGFR has been calculated using the CKD EPI equation.     This calculation has not been validated in all clinical situations.     eGFR's persistently <90 mL/min signify possible Chronic Kidney     Disease.  GLUCOSE, CAPILLARY     Status: Abnormal   Collection Time    07/15/13  8:23 AM      Result Value Ref Range   Glucose-Capillary 175 (*) 70 - 99 mg/dL  GLUCOSE, CAPILLARY     Status: Abnormal   Collection Time    07/15/13 11:47 AM      Result Value Ref Range   Glucose-Capillary 260 (*) 70 - 99 mg/dL  GLUCOSE, CAPILLARY     Status: Abnormal   Collection Time    07/15/13  5:08 PM      Result Value Ref Range   Glucose-Capillary 262 (*) 70 - 99 mg/dL  GLUCOSE, CAPILLARY     Status: Abnormal   Collection Time    07/15/13  7:23 PM      Result Value Ref Range   Glucose-Capillary  271 (*) 70 - 99 mg/dL  SURGICAL PCR SCREEN     Status: None   Collection Time    07/15/13 10:47 PM      Result Value Ref Range   MRSA, PCR NEGATIVE  NEGATIVE   Staphylococcus aureus NEGATIVE  NEGATIVE   Comment:            The Xpert SA Assay (FDA     approved for NASAL specimens     in patients over 39 years of age),     is one component of     a comprehensive surveillance     program.  Test performance has     been validated by [employer] for patients greater     than or equal to 38 year old.     It is not intended     to diagnose infection nor to     guide or monitor treatment.  GLUCOSE, CAPILLARY     Status: Abnormal   Collection Time    07/15/13 11:54 PM      Result Value Ref Range   Glucose-Capillary 244 (*) 70 - 99 mg/dL  GLUCOSE, CAPILLARY     Status: Abnormal   Collection Time    07/16/13  4:05 AM      Result Value Ref Range   Glucose-Capillary 234 (*) 70 - 99 mg/dL  GLUCOSE, CAPILLARY     Status: Abnormal   Collection Time    07/16/13  5:48 AM  Result Value Ref Range   Glucose-Capillary 208 (*) 70 - 99 mg/dL  RENAL FUNCTION PANEL     Status: Abnormal   Collection Time    07/16/13  6:22 AM      Result Value Ref Range   Sodium 134 (*) 137 - 147 mEq/L   Potassium 4.2  3.7 - 5.3 mEq/L   Chloride 96  96 - 112 mEq/L   CO2 19  19 - 32 mEq/L   Glucose, Bld 219 (*) 70 - 99 mg/dL   BUN 72 (*) 6 - 23 mg/dL   Creatinine, Ser 7.57 (*) 0.50 - 1.10 mg/dL   Calcium 8.7  8.4 - 10.5 mg/dL   Phosphorus 6.8 (*) 2.3 - 4.6 mg/dL   Albumin 2.6 (*) 3.5 - 5.2 g/dL   GFR calc non Af Amer 5 (*) >90 mL/min   GFR calc Af Amer 6 (*) >90 mL/min   Comment: (NOTE)     The eGFR has been calculated using the CKD EPI equation.     This calculation has not been validated in all clinical situations.     eGFR's persistently <90 mL/min signify possible Chronic Kidney     Disease.    Studies/Results: Dg Chest 2 View  07/14/2013   CLINICAL DATA:  Hypertension, diabetes, left base  atelectasis  EXAM: CHEST  2 VIEW  COMPARISON:  07/12/2013  FINDINGS: Normal heart size and vascularity. No focal pneumonia, collapse or consolidation. Resolved left base atelectasis. Trace left effusion noted, appreciated on the lateral view. No pneumothorax. Trachea is midline.  IMPRESSION: Trace left pleural effusion.  No other acute finding.   Electronically Signed   By: Daryll Brod M.D.   On: 07/14/2013 09:41   Dg Abd 1 View  07/15/2013   CLINICAL DATA:  Abdominal pain and nausea  EXAM: ABDOMEN - 1 VIEW  COMPARISON:  None.  FINDINGS: There is stool and air in the colon; there is no appreciable colonic dilatation. Essentially all small bowel loops are fluid-filled. No free air is seen on this supine examination. There are multiple phleboliths in the pelvis.  IMPRESSION: Small bowel is fluid-filled. This finding may be seen normally. It also, however, may be seen with early ileus or enteritis. Obstruction is not felt to be likely. No free air is seen on this supine examination.   Electronically Signed   By: Lowella Grip M.D.   On: 07/15/2013 21:35   Medications: Scheduled Meds: . [MAR HOLD] amLODipine  10 mg Oral Daily  . [facility] HOLD] atenolol  50 mg Oral BID  . [facility] HOLD] cloNIDine  0.1 mg Oral BID  . [MAR HOLD] darbepoetin (ARANESP) injection - NON-DIALYSIS  60 mcg Subcutaneous Q Sat-1800  . [MAR HOLD] enoxaparin (LOVENOX) injection  30 mg Subcutaneous Q24H  . [facility] HOLD] furosemide  80 mg Oral Daily  . [MAR HOLD] insulin aspart  0-9 Units Subcutaneous BID  . [MAR HOLD] insulin glargine  5 Units Subcutaneous QHS  . [MAR HOLD] pantoprazole  40 mg Oral Daily  . [MAR HOLD] polyethylene glycol  17 g Oral Daily  . [facility] HOLD] sodium bicarbonate  650 mg Oral TID  . [MAR HOLD] Vitamin D (Ergocalciferol)  50,000 Units Oral Once per day on Mon Thu   Continuous Infusions: . sodium chloride     PRN Meds:.[MAR HOLD] acetaminophen, [MAR HOLD] acetaminophen, [MAR HOLD] albuterol, [MAR HOLD] bisacodyl,  [MAR HOLD] dextrose, [MAR HOLD] HYDROcodone-acetaminophen, [MAR HOLD]  HYDROmorphone (DILAUDID) injection, [MAR HOLD] LORazepam, [MAR HOLD] ondansetron (  ZOFRAN) IV, [MAR HOLD] ondansetron (ZOFRAN) IV, [MAR HOLD] ondansetron, [MAR HOLD] promethazine, [MAR HOLD] zolpidem  Assessment/Plan:  Principal Problem:  Acute renal failure - renal function continues to decline daily. Will certainly need dialysis.  Right IJ dialysis catheter placed this a.m.  Continue to follow creatinine over the next few days - dialysis either later today or tomorrow per nephrology Active Problems:  Hyperkalemia - controlled  DKA (diabetic ketoacidoses) - resolved - we'll change CBG checks to twice daily, before breakfast and supper - well-controlled  Malignant hypertension -resolved - off of hydralazine and now on clonidine, beta blocker, and calcium channel blocker -  Chronic kidney disease, stage V (severe) - as per nephrolog Noncompliance - aware, will likely be much easier if she can get covered by Medicaid  Anemia - not severe  Dyspnea - improved  CAP - chest x-ray completely clear.  Pneumonia may have been just atelectasis Insomnia - low-dose Ambien as needed  Situational anxiety - alprazolam as needed Depression - start escitalopram 5 milligrams daily  Disposition - dialysis to start later today or tomorrow   LOS: 6 days   Henrine Screws, MD 07/16/2013, 8:25 AM

## 2013-07-16 NOTE — Anesthesia Preprocedure Evaluation (Addendum)
Anesthesia Evaluation  Patient identified by MRN, date of birth, ID band Patient awake    Reviewed: Allergy & Precautions, H&P , NPO status , Patient's Chart, lab work & pertinent test results  History of Anesthesia Complications (+) PROLONGED EMERGENCE and history of anesthetic complications  Airway Mallampati: II TM Distance: >3 FB Neck ROM: Full    Dental  (+) Teeth Intact, Dental Advisory Given   Pulmonary shortness of breath and lying,  breath sounds clear to auscultation        Cardiovascular hypertension, Pt. on medications Rhythm:Regular Rate:Normal     Neuro/Psych    GI/Hepatic negative GI ROS, Neg liver ROS,   Endo/Other  diabetes, Type 2, Insulin Dependent  Renal/GU ESRFRenal disease     Musculoskeletal   Abdominal   Peds  Hematology   Anesthesia Other Findings   Reproductive/Obstetrics negative OB ROS                        Anesthesia Physical Anesthesia Plan  ASA: III  Anesthesia Plan: General and MAC   Post-op Pain Management:    Induction: Intravenous  Airway Management Planned: Natural Airway and Simple Face Mask  Additional Equipment:   Intra-op Plan:   Post-operative Plan: Extubation in OR  Informed Consent: I have reviewed the patients History and Physical, chart, labs and discussed the procedure including the risks, benefits and alternatives for the proposed anesthesia with the patient or authorized representative who has indicated his/her understanding and acceptance.   Dental advisory given  Plan Discussed with: CRNA and Anesthesiologist  Anesthesia Plan Comments: (Acute Renal failure K- 4.2 Type 2 DM glucose 219 Malignant hypertension Claustrophobia  Plan MAC  Kipp Broodavid Joslin, MD)        Anesthesia Quick Evaluation

## 2013-07-16 NOTE — Op Note (Signed)
    OPERATIVE REPORT  DATE OF SURGERY: 07/16/2013  PATIENT: [name], 53 y.o. female MRN: [id number]  DOB: 06/24/1960  PRE-OPERATIVE DIAGNOSIS: End-stage renal disease  POST-OPERATIVE DIAGNOSIS:  Same  PROCEDURE: Right IJ hemodialysis catheter with ultrasound visualization  SURGEON:  Gretta Beganodd Early, M.D.  PHYSICIAN ASSISTANT: Nurse  ANESTHESIA:  MAC  EBL: Minimal ml  Total I/O In: 200 [I.V.:200] Out: -   BLOOD ADMINISTERED: None  DRAINS: None  SPECIMEN: None  COUNTS CORRECT:  YES  PLAN OF CARE: PACU with chest x-ray pending   PATIENT DISPOSITION:  PACU - hemodynamically stable  PROCEDURE DETAILS: Patient was taken to the operating placed supine position where the area the right and left neck were prepped and draped in usual sterile fashion. SonoSite ultrasound revealed widely patent right internal jugular vein. Using local anesthesia an 18-gauge needle under SonoSite visualization the right internal jugular vein was accessed. Next a guidewire was passed down to the level of right atrium and this was confirmed with fluoroscopy. A dilator and peel-away sheath was passed over the guidewire and a 27 cm hemodialysis catheters position level the distal right atrium. The catheter peel-away sheath was removed. The catheter was brought through a separate stab incision through a separate tunnel. The Dacron cuff was left in the tunnel. The catheter was secured to the skin with a 3-0 nylon stitch and the entry site was closed with 4 subcuticular Vicryl stitch. A 2 lm ports were attached in both lumens flushed and aspirated easily reluctant without unit per cc heparin. Catheter was dressed in the usual sterile fashion was locked with 1000 unit per cc heparin. The catheter the patient was taken to recovery room where chest x-ray is pending   Gretta Beganodd Early, M.D. 07/16/2013 8:12 AM

## 2013-07-16 NOTE — Anesthesia Postprocedure Evaluation (Signed)
  Anesthesia Post-op Note  Patient: [name]  Procedure(s) Performed: Procedure(s): INSERTION OF DIALYSIS CATHETER (N/A)  Patient Location: PACU  Anesthesia Type:MAC  Level of Consciousness: awake, alert  and oriented  Airway and Oxygen Therapy: Patient Spontanous Breathing  Post-op Pain: none  Post-op Assessment: Post-op Vital signs reviewed, Patient's Cardiovascular Status Stable, Respiratory Function Stable, Patent Airway and Pain level controlled  Post-op Vital Signs: stable  Complications: No apparent anesthesia complications

## 2013-07-16 NOTE — Interval H&P Note (Signed)
History and Physical Interval Note:  07/16/2013 7:13 AM  [name]  has presented today for surgery, with the diagnosis of Dialysis  The various methods of treatment have been discussed with the patient and family. After consideration of risks, benefits and other options for treatment, the patient has consented to  Procedure(s): INSERTION OF DIALYSIS CATHETER (N/A) as a surgical intervention .  The patient's history has been reviewed, patient examined, no change in status, stable for surgery.  I have reviewed the patient's chart and labs.  Questions were answered to the patient's satisfaction.     Demetry Bendickson

## 2013-07-16 NOTE — Anesthesia Procedure Notes (Signed)
Procedure Name: MAC Date/Time: 07/16/2013 7:35 AM Performed by: Lovie CholOCK, Magdeline Prange K Pre-anesthesia Checklist: Patient identified, Emergency Drugs available, Suction available, Patient being monitored and Timeout performed Patient Re-evaluated:Patient Re-evaluated prior to inductionOxygen Delivery Method: Nasal cannula

## 2013-07-16 NOTE — Progress Notes (Signed)
[facility] ROUNDING NOTE  Summary Impression  53 yo WF with longstanding DM and HTN, both poorly controlled for at least a year, with CKD present as far back as 2013 with creatinine of 2.5 at that time, 3 in early 2014, and 5.3 now - likely representing progression of underlying disease and not likely reversible (likely to worsen as blood pressure is brought down) Ultrasound reveals bilaterally small kidneys. UPC 14 grams proteinuria.  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 Has progressed in house and will require initiation of dialysis. TDC placed 4/5 by Dr. Arbie CookeyEarly  1. CKD5 - DM+HTN. US bilat small kidneys.  UPC 14 gms prot. SPEP still pending. vein mapping done; for right brachiocephalic AVF on Tuesday.  Creatinine is still rising (I suspect in part due to controlling BP in setting of impaired autoregulation) and pt now having frequent nausea.  TDC placed today (4/5) and will get HD (orders written for 4/5, 4/6.  AVF Tuesday 4/7. CLIP for outpt HD. 2. Bones - PTH still pending. 25 Vit D quite low;  Replete 50,000 twice a week 3. DM - per primary service.  4. Uncontrolled/accelerated HTN - Improved.  NO ACE or ARB with this advanced CKD; . Continue current furosemide 80/day.(re[facility] as Dr. Kev[facility]ates [facility] until starts [facility] 5. SOB - PNA +/-CHF; no overt pulm edema on repeat CXR; echo normal EF; grade 1 diastolic dysf; repeat CXR basilar atelectasis and no edema.  PND/orthopnea much better! ATB's changed to levaquin.  6. Anemia - iron deficient by studies; Feraheme X 1was given; Hb has now dropped to <10-. Started Aranesp 60/week (4/4). 7. Asthma/seasonal allergy - albuterol 8. Metabolic acidosis - oral bicarb until starts HD 9. CAP - atb's per primary service   Subjective:  Nausea has recurred and is more frequent. Does not feel well today Not  SOB  Objective Vital signs in last 24 hours: Filed Vitals:   07/16/13 0821 07/16/13 0830 07/16/13 0845 07/16/13 0900  BP: 181/76 163/74 159/69   Pulse:  66 65 63  Temp: 98.6 F (37 C)   98.6 F (37 C)  TempSrc:      Resp: 24 17 16 15   Height:      Weight:      SpO2: 100% 100% 100% 100%   Weight change: 0.2 kg (7.1 oz)  Intake/Output Summary (Last 24 hours) at 07/16/13 1306 Last data filed at 07/16/13 0815  Gross per 24 hour  Intake    760 ml  Output      0 ml  Net    760 ml   Physical Exam:  Blood pressure 205/77, pulse 71, temperature 98.5 F (36.9 C), temperature source Oral, resp. rate 15, height 5\' 1"  (1.549 m), weight 84.6 kg (186 lb 8.2 oz), SpO2 96.00%. Obese WF NAD Not on O2 NAD Lungs clear Normal S1S2 No S3 or pericardial rub No pericardial rub No LE edema   Recent Labs Lab 07/10/13 2113 07/11/13 0340 07/12/13 0245 07/13/13 0319 07/14/13 0552 07/15/13 0528 07/16/13 0622  NA 134* 133*  132* 132* 132* 134* 135* 134*  K 4.5 4.5  4.4 4.4 4.0 4.3 4.3 4.2  CL 97 98  97 96 96 96 97 96  CO2 17* 17*  16* 17* 18* 19 19 19   GLUCOSE 141* 115*  112* 83 107* 151*  230* 219*  BUN 61* 64*  64* 65* 65* 67* 66* 72*  CREATININE 5.31* 5.44*  5.35* 5.79* 6.36* 6.84* 7.45* 7.57*  CALCIUM 8.6 8.9  8.8 9.1 8.5 8.4 8.2* 8.7  PHOS  --  5.0* 6.0* 6.0* 6.5*  --  6.8*   PTH pending  Recent Labs Lab 07/10/13 1330  07/13/13 0319 07/14/13 0552 07/16/13 0622  AST 12  --   --   --   --   ALT 17  --   --   --   --   ALKPHOS 130*  --   --   --   --   BILITOT <0.2*  --   --   --   --   PROT 6.1  --   --   --   --   ALBUMIN 2.6*  < > 2.3* 2.6* 2.6*  < > = values in this interval not displayed.  Recent Labs Lab 07/10/13 1330 07/11/13 0340 07/12/13 0245 07/15/13 0528  WBC 5.5 5.9 5.8 4.2  NEUTROABS 4.1  --  3.4 2.6  HGB 11.2* 10.9* 10.8* 9.5*  HCT 32.8* 31.6* 31.6* 28.3*  MCV 77.7* 77.3* 78.6 79.3  PLT 186 215 228 183    Recent Labs Lab 07/15/13 1923  07/15/13 2354 07/16/13 0405 07/16/13 0548 07/16/13 0828  GLUCAP 271* 244* 234* 208* 182*    Recent Labs Lab 07/11/13 0340  IRON 14*  TIBC 231*  FERRITIN 117   Vit D, 25-Hydroxy  12 (*) 30 - 89 ng/mL  PTH pending  Studies/Results: Dg Abd 1 View  07/15/2013   CLINICAL DATA:  Abdominal pain and nausea  EXAM: ABDOMEN - 1 VIEW  COMPARISON:  None.  FINDINGS: There is stool and air in the colon; there is no appreciable colonic dilatation. Essentially all small bowel loops are fluid-filled. No free air is seen on this supine examination. There are multiple phleboliths in the pelvis.  IMPRESSION: Small bowel is fluid-filled. This finding may be seen normally. It also, however, may be seen with early ileus or enteritis. Obstruction is not felt to be likely. No free air is seen on this supine examination.   Electronically Signed   By: Bretta Bang M.D.   On: 07/15/2013 21:35   Dg Chest Port 1 View  07/16/2013   CLINICAL DATA:  Post dialysis catheter placement  EXAM: PORTABLE CHEST - 1 VIEW  COMPARISON:  07/14/2013  FINDINGS: Right-sided dialysis catheter is now noted. No underlying pneumothorax is seen. Inspiratory effort is poor with crowding of the vascular markings. The cardiac shadow is stable. No bony abnormality is noted.  IMPRESSION: Status post dialysis catheter placement in satisfactory position. No acute abnormality is noted.   Electronically Signed   By: Alcide Clever M.D.   On: 07/16/2013 08:49   ECHO EF 60-65% Grade 1 diastolic dysfunction  Medications: . sodium chloride     . amLODipine  10 mg Oral Daily  . atenolol  50 mg Oral BID  . cloNIDine  0.1 mg Oral BID  . darbepoetin (ARANESP) injection - NON-DIALYSIS  60 mcg Subcutaneous Q Sat-1800  . enoxaparin (LOVENOX) injection  30 mg Subcutaneous Q24H  . fentaNYL      . furosemide  80 mg Oral Daily  . insulin aspart  0-9 Units Subcutaneous BID  . insulin glargine  5 Units Subcutaneous QHS  . pantoprazole  40 mg Oral Daily   . polyethylene glycol  17 g Oral Daily  . sodium bicarbonate  650  mg Oral TID  . Vitamin D (Ergocalciferol)  50,000 Units Oral Once per day on Mon Thu     Camille Bal, MD [facility] [phone] Pager 07/16/2013, 1:06 PM

## 2013-07-16 NOTE — H&P (View-Only) (Signed)
VASCULAR SURGERY  The patient is scheduled for a right brachiocephalic AV fistula on Tuesday next week by Dr. Darrick PennaFields. If the decision is made that she will need dialysis this admission we could place a catheter at the same time. Please let us know.   Cari CarawayChris Zaela Graley Beeper [phone] 07/13/2013

## 2013-07-16 NOTE — Transfer of Care (Signed)
Immediate Anesthesia Transfer of Care Note  Patient: [name]  Procedure(s) Performed: Procedure(s): INSERTION OF DIALYSIS CATHETER (N/A)  Patient Location: PACU  Anesthesia Type:MAC  Level of Consciousness: sedated and patient cooperative  Airway & Oxygen Therapy: Patient Spontanous Breathing and Patient connected to nasal cannula oxygen  Post-op Assessment: Report given to PACU RN and Post -op Vital signs reviewed and stable  Post vital signs: Reviewed  Complications: No apparent anesthesia complications

## 2013-07-16 NOTE — Progress Notes (Signed)
Dialysis called had emergent situation and will not get to patient till 4.5 more hours and to hold any bp medications even though it will be later.

## 2013-07-17 ENCOUNTER — Encounter (HOSPITAL_COMMUNITY): Payer: Self-pay | Admitting: Vascular Surgery

## 2013-07-17 LAB — GLUCOSE, CAPILLARY
GLUCOSE-CAPILLARY: 329 mg/dL — AB (ref 70–99)
GLUCOSE-CAPILLARY: 252 mg/dL — AB (ref 70–99)
Glucose-Capillary: 232 mg/dL — ABNORMAL HIGH (ref 70–99)
Glucose-Capillary: 242 mg/dL — ABNORMAL HIGH (ref 70–99)
Glucose-Capillary: 306 mg/dL — ABNORMAL HIGH (ref 70–99)

## 2013-07-17 LAB — RENAL FUNCTION PANEL
ALBUMIN: 2.6 g/dL — AB (ref 3.5–5.2)
BUN: 64 mg/dL — ABNORMAL HIGH (ref 6–23)
CO2: 22 mEq/L (ref 19–32)
CREATININE: 7.2 mg/dL — AB (ref 0.50–1.10)
Calcium: 8.8 mg/dL (ref 8.4–10.5)
Chloride: 92 mEq/L — ABNORMAL LOW (ref 96–112)
GFR calc Af Amer: 7 mL/min — ABNORMAL LOW (ref >90)
GFR, EST NON AFRICAN AMERICAN: 6 mL/min — AB (ref >90)
Glucose, Bld: 245 mg/dL — ABNORMAL HIGH (ref 70–99)
Phosphorus: 5.2 mg/dL — ABNORMAL HIGH (ref 2.3–4.6)
Potassium: 4.4 mEq/L (ref 3.7–5.3)
Sodium: 133 mEq/L — ABNORMAL LOW (ref 137–147)

## 2013-07-17 MED ORDER — RENA-VITE PO TABS
1.0000 | ORAL_TABLET | Freq: Every day | ORAL | Status: DC
  Administered 2013-07-17: 1 via ORAL
  Administered 2013-07-18: 23:00:00 via ORAL
  Administered 2013-07-19 – 2013-07-25 (×7): 1 via ORAL
  Filled 2013-07-17 (×10): qty 1

## 2013-07-17 MED ORDER — SODIUM CHLORIDE 0.9 % IV SOLN: 125.0000 mg | INTRAVENOUS | Status: DC

## 2013-07-17 MED ORDER — SODIUM CHLORIDE 0.9 % IV SOLN
125.0000 mg | INTRAVENOUS | Status: DC
  Administered 2013-07-19 – 2013-07-21 (×2): 125 mg via INTRAVENOUS
  Filled 2013-07-17 (×7): qty 10

## 2013-07-17 MED ORDER — AMLODIPINE BESYLATE 10 MG PO TABS
10.0000 mg | ORAL_TABLET | Freq: Every day | ORAL | Status: DC
  Administered 2013-07-18 – 2013-07-19 (×2): 10 mg via ORAL
  Filled 2013-07-17 (×3): qty 1

## 2013-07-17 MED ORDER — INSULIN GLARGINE 100 UNIT/ML ~~LOC~~ SOLN
10.0000 [IU] | Freq: Every day | SUBCUTANEOUS | Status: DC
  Administered 2013-07-17: 10 [IU] via SUBCUTANEOUS
  Filled 2013-07-17 (×2): qty 0.1

## 2013-07-17 MED ORDER — ATENOLOL 25 MG PO TABS
25.0000 mg | ORAL_TABLET | Freq: Every day | ORAL | Status: DC
  Administered 2013-07-18 – 2013-07-25 (×8): 25 mg via ORAL
  Filled 2013-07-17 (×9): qty 1

## 2013-07-17 MED ORDER — DEXTROSE 5 % IV SOLN
1.5000 g | INTRAVENOUS | Status: AC
  Administered 2013-07-18: 1.5 g via INTRAVENOUS
  Filled 2013-07-17: qty 1.5

## 2013-07-17 NOTE — Progress Notes (Signed)
Subjective: Interval History: has no complaint, cath did not fct well last pm, to dialyze today.  Objective: Vital signs in last 24 hours: Temp:  [98.6 F (37 C)-100 F (37.8 C)] 98.7 F (37.1 C) (04/06 0814) Pulse Rate:  [66-84] 72 (04/06 0814) Resp:  [16-20] 19 (04/06 0814) BP: (114-194)/(61-95) 114/69 mmHg (04/06 0814) SpO2:  [92 %-99 %] 94 % (04/06 0814) Weight:  [86.5 kg (190 lb 11.2 oz)-86.9 kg (191 lb 9.3 oz)] 86.9 kg (191 lb 9.3 oz) (04/05 2153) Weight change: 0.8 kg (1 lb 12.2 oz)  Intake/Output from previous day: 04/05 0701 - 04/06 0700 In: 640 [P.O.:240; I.V.:400] Out: 37  Intake/Output this shift:    General appearance: alert, cooperative, moderately obese and pale Neck: RIJ cath Resp: diminished breath sounds bilaterally and rales bibasilar Cardio: S1, S2 normal and systolic murmur: systolic ejection 2/6, decrescendo at 2nd left intercostal space GI: obese, pos bs, liver down 5 cm Extremities: edema 3+  Lab Results:  Recent Labs  07/15/13 0528  WBC 4.2  HGB 9.5*  HCT 28.3*  PLT 183   BMET:  Recent Labs  07/15/13 0528 07/16/13 0622  NA 135* 134*  K 4.3 4.2  CL 97 96  CO2 19 19  GLUCOSE 230* 219*  BUN 66* 72*  CREATININE 7.45* 7.57*  CALCIUM 8.2* 8.7   No results found for this basename: PTH,  in the last 72 hours Iron Studies: No results found for this basename: IRON, TIBC, TRANSFERRIN, FERRITIN,  in the last 72 hours  Studies/Results: Dg Abd 1 View  07/15/2013   CLINICAL DATA:  Abdominal pain and nausea  EXAM: ABDOMEN - 1 VIEW  COMPARISON:  None.  FINDINGS: There is stool and air in the colon; there is no appreciable colonic dilatation. Essentially all small bowel loops are fluid-filled. No free air is seen on this supine examination. There are multiple phleboliths in the pelvis.  IMPRESSION: Small bowel is fluid-filled. This finding may be seen normally. It also, however, may be seen with early ileus or enteritis. Obstruction is not felt to be  likely. No free air is seen on this supine examination.   Electronically Signed   By: Bretta BangWilliam  Woodruff M.D.   On: 07/15/2013 21:35   Dg Chest Port 1 View  07/16/2013   CLINICAL DATA:  Post dialysis catheter placement  EXAM: PORTABLE CHEST - 1 VIEW  COMPARISON:  07/14/2013  FINDINGS: Right-sided dialysis catheter is now noted. No underlying pneumothorax is seen. Inspiratory effort is poor with crowding of the vascular markings. The cardiac shadow is stable. No bony abnormality is noted.  IMPRESSION: Status post dialysis catheter placement in satisfactory position. No acute abnormality is noted.   Electronically Signed   By: Alcide CleverMark  Lukens M.D.   On: 07/16/2013 08:49   Dg Fluoro Guide Cv Line-no Report  07/16/2013   CLINICAL DATA: dialysis cath   FLOURO GUIDE CV LINE  Fluoroscopy was utilized by the requesting physician.  No radiographic  interpretation.     I have reviewed the patient's current medications.  Assessment/Plan: 1 CRF for HD.  Lower solute and vol.  bp will be better with lower vol. 2 BP can lower meds with lower vol.  3 Anemia epo check Fe 4 Acidemia use HD 5 DM needs better control 6 Noncompliance P HD, epo, fe, vit D, simplify regime     LOS: 7 days   Belanna Manring L 07/17/2013,11:57 AM

## 2013-07-17 NOTE — Progress Notes (Signed)
Hemodialyisis- Tolerated procedure well without issue. Cath ran well. 1L UF. Pt has no complaints

## 2013-07-17 NOTE — Progress Notes (Signed)
Subjective: Patient sleeping comfortably.  Sinus rhythm at 75.  Did not waken  Objective: Weight change: 1 lb 12.2 oz (0.8 kg)  Intake/Output Summary (Last 24 hours) at 07/17/13 0659 Last data filed at 07/16/13 2300  Gross per 24 hour  Intake    640 ml  Output     37 ml  Net    603 ml   Filed Vitals:   07/16/13 2000 07/16/13 2030 07/16/13 2153 07/17/13 0417  BP: 140/78 137/81 194/95 147/83  Pulse: 80 84 76 68  Temp:  100 F (37.8 C) 98.9 F (37.2 C) 98.8 F (37.1 C)  TempSrc:  Oral Oral Oral  Resp: 20 20 18 18   Height:      Weight:  190 lb 11.2 oz (86.5 kg) 191 lb 9.3 oz (86.9 kg)   SpO2:  96% 96% 92%    General Appearance: Alert, cooperative, no distress, appears stated age Neck:  Right IJ dialysis catheter in place Extremities: Extremities normal, atraumatic, no cyanosis or edema Neuro: Currently asleep, in no acute distress  Lab Results: Results for orders placed during the hospital encounter of 07/10/13 (from the past 48 hour(s))  GLUCOSE, CAPILLARY     Status: Abnormal   Collection Time    07/15/13  8:23 AM      Result Value Ref Range   Glucose-Capillary 175 (*) 70 - 99 mg/dL  GLUCOSE, CAPILLARY     Status: Abnormal   Collection Time    07/15/13 11:47 AM      Result Value Ref Range   Glucose-Capillary 260 (*) 70 - 99 mg/dL  GLUCOSE, CAPILLARY     Status: Abnormal   Collection Time    07/15/13  5:08 PM      Result Value Ref Range   Glucose-Capillary 262 (*) 70 - 99 mg/dL  GLUCOSE, CAPILLARY     Status: Abnormal   Collection Time    07/15/13  7:23 PM      Result Value Ref Range   Glucose-Capillary 271 (*) 70 - 99 mg/dL  SURGICAL PCR SCREEN     Status: None   Collection Time    07/15/13 10:47 PM      Result Value Ref Range   MRSA, PCR NEGATIVE  NEGATIVE   Staphylococcus aureus NEGATIVE  NEGATIVE   Comment:            The Xpert SA Assay (FDA     approved for NASAL specimens     in patients over 76 years of age),     is one component of     a  comprehensive surveillance     program.  Test performance has     been validated by [employer] for patients greater     than or equal to 56 year old.     It is not intended     to diagnose infection nor to     guide or monitor treatment.  GLUCOSE, CAPILLARY     Status: Abnormal   Collection Time    07/15/13 11:54 PM      Result Value Ref Range   Glucose-Capillary 244 (*) 70 - 99 mg/dL  GLUCOSE, CAPILLARY     Status: Abnormal   Collection Time    07/16/13  4:05 AM      Result Value Ref Range   Glucose-Capillary 234 (*) 70 - 99 mg/dL  GLUCOSE, CAPILLARY     Status: Abnormal   Collection Time    07/16/13  5:48 AM      Result Value Ref Range   Glucose-Capillary 208 (*) 70 - 99 mg/dL  RENAL FUNCTION PANEL     Status: Abnormal   Collection Time    07/16/13  6:22 AM      Result Value Ref Range   Sodium 134 (*) 137 - 147 mEq/L   Potassium 4.2  3.7 - 5.3 mEq/L   Chloride 96  96 - 112 mEq/L   CO2 19  19 - 32 mEq/L   Glucose, Bld 219 (*) 70 - 99 mg/dL   BUN 72 (*) 6 - 23 mg/dL   Creatinine, Ser 7.57 (*) 0.50 - 1.10 mg/dL   Calcium 8.7  8.4 - 10.5 mg/dL   Phosphorus 6.8 (*) 2.3 - 4.6 mg/dL   Albumin 2.6 (*) 3.5 - 5.2 g/dL   GFR calc non Af Amer 5 (*) >90 mL/min   GFR calc Af Amer 6 (*) >90 mL/min   Comment: (NOTE)     The eGFR has been calculated using the CKD EPI equation.     This calculation has not been validated in all clinical situations.     eGFR's persistently <90 mL/min signify possible Chronic Kidney     Disease.  GLUCOSE, CAPILLARY     Status: Abnormal   Collection Time    07/16/13  8:28 AM      Result Value Ref Range   Glucose-Capillary 182 (*) 70 - 99 mg/dL  GLUCOSE, CAPILLARY     Status: Abnormal   Collection Time    07/16/13 12:25 PM      Result Value Ref Range   Glucose-Capillary 178 (*) 70 - 99 mg/dL  GLUCOSE, CAPILLARY     Status: Abnormal   Collection Time    07/16/13  4:43 PM      Result Value Ref Range   Glucose-Capillary 194 (*) 70 - 99 mg/dL    Comment 1 Documented in Chart     Comment 2 Notify RN    GLUCOSE, CAPILLARY     Status: Abnormal   Collection Time    07/16/13  9:51 PM      Result Value Ref Range   Glucose-Capillary 208 (*) 70 - 99 mg/dL    Studies/Results: Dg Abd 1 View  07/15/2013   CLINICAL DATA:  Abdominal pain and nausea  EXAM: ABDOMEN - 1 VIEW  COMPARISON:  None.  FINDINGS: There is stool and air in the colon; there is no appreciable colonic dilatation. Essentially all small bowel loops are fluid-filled. No free air is seen on this supine examination. There are multiple phleboliths in the pelvis.  IMPRESSION: Small bowel is fluid-filled. This finding may be seen normally. It also, however, may be seen with early ileus or enteritis. Obstruction is not felt to be likely. No free air is seen on this supine examination.   Electronically Signed   By: Lowella Grip M.D.   On: 07/15/2013 21:35   Dg Chest Port 1 View  07/16/2013   CLINICAL DATA:  Post dialysis catheter placement  EXAM: PORTABLE CHEST - 1 VIEW  COMPARISON:  07/14/2013  FINDINGS: Right-sided dialysis catheter is now noted. No underlying pneumothorax is seen. Inspiratory effort is poor with crowding of the vascular markings. The cardiac shadow is stable. No bony abnormality is noted.  IMPRESSION: Status post dialysis catheter placement in satisfactory position. No acute abnormality is noted.   Electronically Signed   By: Inez Catalina M.D.   On: 07/16/2013 08:49   Dg  Fluoro Guide Cv Line-no Report  07/16/2013   CLINICAL DATA: dialysis cath   FLOURO GUIDE CV LINE  Fluoroscopy was utilized by the requesting physician.  No radiographic  interpretation.    Medications: Scheduled Meds: . amLODipine  10 mg Oral Daily  . atenolol  50 mg Oral BID  . cloNIDine  0.1 mg Oral BID  . darbepoetin (ARANESP) injection - NON-DIALYSIS  60 mcg Subcutaneous Q Sat-1800  . furosemide  80 mg Oral Daily  . insulin aspart  0-9 Units Subcutaneous BID  . insulin glargine  10 Units  Subcutaneous QHS  . pantoprazole  40 mg Oral Daily  . polyethylene glycol  17 g Oral Daily  . sodium bicarbonate  650 mg Oral TID  . Vitamin D (Ergocalciferol)  50,000 Units Oral Once per day on Mon Thu   Continuous Infusions: . sodium chloride     PRN Meds:.acetaminophen, acetaminophen, albuterol, bisacodyl, dextrose, HYDROcodone-acetaminophen, HYDROmorphone (DILAUDID) injection, LORazepam, ondansetron (ZOFRAN) IV, ondansetron (ZOFRAN) IV, ondansetron, promethazine, zolpidem  Assessment/Plan: Principal Problem:   Acute renal failure Active Problems:   Hyperkalemia   DKA (diabetic ketoacidoses)   Malignant hypertension   Chronic kidney disease, stage IV (severe)   Noncompliance   Anemia   Dyspnea  Principal Problem:  Acute renal failure - renal function continues to decline daily.  Probable dialysis today. Right IJ dialysis catheter placed yesterday.  Active Problems:  Hyperkalemia - controlled  DKA (diabetic ketoacidoses) - resolved - CBGs in high 100s and low 200s.  Increase Lantus by 5 units daily and continue sliding scale NovoLog twice a day  Malignant hypertension -resolved - off of hydralazine and now on clonidine, beta blocker, and calcium channel blocker, and Lasix Chronic kidney disease, stage V (severe) - as per nephrology  Noncompliance - aware, will likely be much easier if she can get covered by Medicaid  Anemia - not severe - Aranesp started Dyspnea - improved  CAP - chest x-ray completely clear. Pneumonia may have been just atelectasis.  Off antibiotic therapy  Insomnia - low-dose Ambien as needed  Situational anxiety - alprazolam as needed  Depression - continue escitalopram 5 milligrams daily  Disposition - dialysis to start later today or tomorrow   LOS: 7 days   Henrine Screws, MD 07/17/2013, 6:59 AM

## 2013-07-17 NOTE — Progress Notes (Addendum)
Inpatient Diabetes Program Recommendations  AACE/ADA: New Consensus Statement on Inpatient Glycemic Control (2013)  Target Ranges:  Prepandial:   less than 140 mg/dL      Peak postprandial:   less than 180 mg/dL (1-2 hours)      Critically ill patients:  140 - 180 mg/dL     Results for [name], [name] (MRN [id number]) as of 07/17/2013 09:14  Ref. Range 07/15/2013 23:54 07/16/2013 04:05 07/16/2013 05:48 07/16/2013 08:28 07/16/2013 12:25 07/16/2013 16:43 07/16/2013 21:51  Glucose-Capillary Latest Range: 70-99 mg/dL 914244 (H) 782234 (H) 956208 (H) 182 (H) 178 (H) 194 (H) 208 (H)    Results for [name], [name] (MRN [id number]) as of 07/17/2013 09:14  Ref. Range 07/17/2013 04:14 07/17/2013 08:13  Glucose-Capillary Latest Range: 70-99 mg/dL 469329 (H) 629306 (H)     **Note Lantus increased to 10 units QHS today by primary MD  **Note patient to have AVF placement on 04/07   MD- Please increase SSI coverage to Moderate scale tid ac + HS (currently ordered as Sensitive scale bid)    Addendum 1328pm: Spoke to patient about the possibility that Dr. Kevan NyGates (her PCP) may send her home on insulin when that time comes.  Patient told me she has taken insulin in the past (both insulin pens and vial/syringe) and that she knows how to administer insulin without any problems.  Would prefer insulin vial/syringe at time of d/c over insulin pens.   Will follow. Ambrose FinlandJeannine Johnston Ferne Ellingwood RN, MSN, CDE Diabetes Coordinator Inpatient Diabetes Program Team Pager: [phone] (8a-10p)

## 2013-07-18 ENCOUNTER — Inpatient Hospital Stay (HOSPITAL_COMMUNITY): Payer: Medicaid Other | Admitting: Anesthesiology

## 2013-07-18 ENCOUNTER — Telehealth: Payer: Self-pay | Admitting: Vascular Surgery

## 2013-07-18 ENCOUNTER — Encounter (HOSPITAL_COMMUNITY): Payer: Medicaid Other | Admitting: Anesthesiology

## 2013-07-18 ENCOUNTER — Encounter (HOSPITAL_COMMUNITY)
Admission: EM | Disposition: A | Discharge status: Home / Self Care | Payer: Self-pay | Source: Home / Self Care | Attending: Internal Medicine

## 2013-07-18 ENCOUNTER — Encounter (HOSPITAL_COMMUNITY): Payer: Self-pay | Admitting: Anesthesiology

## 2013-07-18 DIAGNOSIS — N186 End stage renal disease: Secondary | ICD-10-CM

## 2013-07-18 HISTORY — PX: AV FISTULA PLACEMENT: SHX1204

## 2013-07-18 LAB — GLUCOSE, CAPILLARY
GLUCOSE-CAPILLARY: 418 mg/dL — AB (ref 70–99)
GLUCOSE-CAPILLARY: 335 mg/dL — AB (ref 70–99)
GLUCOSE-CAPILLARY: 255 mg/dL — AB (ref 70–99)
GLUCOSE-CAPILLARY: 243 mg/dL — AB (ref 70–99)
Glucose-Capillary: 347 mg/dL — ABNORMAL HIGH (ref 70–99)
Glucose-Capillary: 323 mg/dL — ABNORMAL HIGH (ref 70–99)
Glucose-Capillary: 329 mg/dL — ABNORMAL HIGH (ref 70–99)

## 2013-07-18 LAB — CBC
HEMATOCRIT: 27.6 % — AB (ref 36.0–46.0)
Hemoglobin: 9.1 g/dL — ABNORMAL LOW (ref 12.0–15.0)
MCH: 26.4 pg (ref 26.0–34.0)
MCHC: 33 g/dL (ref 30.0–36.0)
MCV: 80 fL (ref 78.0–100.0)
PLATELETS: 181 10*3/uL (ref 150–400)
RBC: 3.45 MIL/uL — ABNORMAL LOW (ref 3.87–5.11)
RDW: 13.3 % (ref 11.5–15.5)
WBC: 8.5 10*3/uL (ref 4.0–10.5)

## 2013-07-18 LAB — BASIC METABOLIC PANEL
BUN: 40 mg/dL — AB (ref 6–23)
CO2: 22 mEq/L (ref 19–32)
CREATININE: 4.87 mg/dL — AB (ref 0.50–1.10)
Calcium: 8.3 mg/dL — ABNORMAL LOW (ref 8.4–10.5)
Chloride: 94 mEq/L — ABNORMAL LOW (ref 96–112)
GFR, EST AFRICAN AMERICAN: 11 mL/min — AB (ref >90)
GFR, EST NON AFRICAN AMERICAN: 9 mL/min — AB (ref >90)
Glucose, Bld: 415 mg/dL — ABNORMAL HIGH (ref 70–99)
Potassium: 4 mEq/L (ref 3.7–5.3)
Sodium: 134 mEq/L — ABNORMAL LOW (ref 137–147)

## 2013-07-18 LAB — PARATHYROID HORMONE, INTACT (NO CA): PTH: 439 pg/mL — AB (ref 14.0–72.0)

## 2013-07-18 LAB — HEPATITIS B SURFACE ANTIGEN: Hepatitis B Surface Ag: NEGATIVE

## 2013-07-18 LAB — HEPATITIS B CORE ANTIBODY, TOTAL: Hep B Core Total Ab: NONREACTIVE

## 2013-07-18 LAB — HEPATITIS B SURFACE ANTIBODY,QUALITATIVE: Hep B S Ab: NEGATIVE

## 2013-07-18 SURGERY — ARTERIOVENOUS (AV) FISTULA CREATION
Anesthesia: Monitor Anesthesia Care | Site: Arm Lower | Laterality: Right

## 2013-07-18 MED ORDER — MIDAZOLAM HCL 5 MG/5ML IJ SOLN
INTRAMUSCULAR | Status: DC | PRN
  Administered 2013-07-18 (×2): 1 mg via INTRAVENOUS

## 2013-07-18 MED ORDER — HYDROMORPHONE HCL PF 1 MG/ML IJ SOLN: 0.2500 mg | INTRAMUSCULAR | Status: DC | PRN

## 2013-07-18 MED ORDER — FENTANYL CITRATE 0.05 MG/ML IJ SOLN
INTRAMUSCULAR | Status: DC | PRN
  Administered 2013-07-18 (×4): 25 ug via INTRAVENOUS

## 2013-07-18 MED ORDER — PROTAMINE SULFATE 10 MG/ML IV SOLN
INTRAVENOUS | Status: AC
  Filled 2013-07-18: qty 5

## 2013-07-18 MED ORDER — OXYCODONE-ACETAMINOPHEN 5-325 MG PO TABS
1.0000 | ORAL_TABLET | ORAL | Status: DC | PRN
  Administered 2013-07-18 – 2013-07-19 (×2): 2 via ORAL
  Filled 2013-07-18 (×3): qty 2

## 2013-07-18 MED ORDER — 0.9 % SODIUM CHLORIDE (POUR BTL) OPTIME
TOPICAL | Status: DC | PRN
  Administered 2013-07-18: 1000 mL

## 2013-07-18 MED ORDER — HEPARIN SODIUM (PORCINE) 1000 UNIT/ML IJ SOLN
INTRAMUSCULAR | Status: DC | PRN
  Administered 2013-07-18: 5000 [IU] via INTRAVENOUS

## 2013-07-18 MED ORDER — DOXERCALCIFEROL 4 MCG/2ML IV SOLN
4.0000 ug | INTRAVENOUS | Status: DC
  Administered 2013-07-19 – 2013-07-21 (×2): 4 ug via INTRAVENOUS
  Filled 2013-07-18 (×3): qty 2

## 2013-07-18 MED ORDER — ONDANSETRON HCL 4 MG/2ML IJ SOLN
INTRAMUSCULAR | Status: AC
  Filled 2013-07-18: qty 2

## 2013-07-18 MED ORDER — PROTAMINE SULFATE 10 MG/ML IV SOLN
INTRAVENOUS | Status: DC | PRN
  Administered 2013-07-18: 50 mg via INTRAVENOUS

## 2013-07-18 MED ORDER — SODIUM CHLORIDE 0.9 % IV SOLN
INTRAVENOUS | Status: DC | PRN
  Administered 2013-07-18: 13:00:00 via INTRAVENOUS

## 2013-07-18 MED ORDER — INSULIN ASPART 100 UNIT/ML ~~LOC~~ SOLN
0.0000 [IU] | Freq: Three times a day (TID) | SUBCUTANEOUS | Status: DC
  Administered 2013-07-18: 2 [IU] via SUBCUTANEOUS
  Administered 2013-07-19: 5 [IU] via SUBCUTANEOUS
  Administered 2013-07-19: 9 [IU] via SUBCUTANEOUS
  Administered 2013-07-20: 5 [IU] via SUBCUTANEOUS
  Administered 2013-07-20: 7 [IU] via SUBCUTANEOUS
  Administered 2013-07-20: 3 [IU] via SUBCUTANEOUS
  Administered 2013-07-21: 5 [IU] via SUBCUTANEOUS
  Administered 2013-07-21: 2 [IU] via SUBCUTANEOUS

## 2013-07-18 MED ORDER — FENTANYL CITRATE 0.05 MG/ML IJ SOLN
INTRAMUSCULAR | Status: AC
Start: 2013-07-18 — End: 2013-07-18
  Filled 2013-07-18: qty 5

## 2013-07-18 MED ORDER — DEXTROSE 5 % IV SOLN
INTRAVENOUS | Status: DC | PRN
  Administered 2013-07-18: 13:00:00 via INTRAVENOUS

## 2013-07-18 MED ORDER — HEPARIN SODIUM (PORCINE) 1000 UNIT/ML IJ SOLN
INTRAMUSCULAR | Status: AC
  Filled 2013-07-18: qty 1

## 2013-07-18 MED ORDER — PROPOFOL 10 MG/ML IV BOLUS
INTRAVENOUS | Status: AC
  Filled 2013-07-18: qty 20

## 2013-07-18 MED ORDER — MIDAZOLAM HCL 2 MG/2ML IJ SOLN
INTRAMUSCULAR | Status: AC
  Filled 2013-07-18: qty 2

## 2013-07-18 MED ORDER — INSULIN GLARGINE 100 UNIT/ML ~~LOC~~ SOLN
15.0000 [IU] | Freq: Every day | SUBCUTANEOUS | Status: DC
  Administered 2013-07-18: 15 [IU] via SUBCUTANEOUS
  Filled 2013-07-18 (×2): qty 0.15

## 2013-07-18 MED ORDER — OXYCODONE HCL 5 MG/5ML PO SOLN: 5.0000 mg | Freq: Once | ORAL | Status: DC | PRN

## 2013-07-18 MED ORDER — SODIUM CHLORIDE 0.9 % IR SOLN
Status: DC | PRN
  Administered 2013-07-18: 14:00:00

## 2013-07-18 MED ORDER — OXYCODONE HCL 5 MG PO TABS: 5.0000 mg | ORAL_TABLET | Freq: Once | ORAL | Status: DC | PRN

## 2013-07-18 MED ORDER — INSULIN ASPART 100 UNIT/ML ~~LOC~~ SOLN
8.0000 [IU] | Freq: Once | SUBCUTANEOUS | Status: AC
  Administered 2013-07-18: 8 [IU] via SUBCUTANEOUS

## 2013-07-18 MED ORDER — PROMETHAZINE HCL 25 MG/ML IJ SOLN: 6.2500 mg | INTRAMUSCULAR | Status: DC | PRN

## 2013-07-18 MED ORDER — PROPOFOL INFUSION 10 MG/ML OPTIME
INTRAVENOUS | Status: DC | PRN
  Administered 2013-07-18: 25 ug/kg/min via INTRAVENOUS

## 2013-07-18 MED ORDER — LIDOCAINE HCL (PF) 1 % IJ SOLN
INTRAMUSCULAR | Status: DC | PRN
  Administered 2013-07-18: 12 mL

## 2013-07-18 MED ORDER — LIDOCAINE HCL (PF) 1 % IJ SOLN
INTRAMUSCULAR | Status: AC
  Filled 2013-07-18: qty 30

## 2013-07-18 SURGICAL SUPPLY — 38 items
ADH SKN CLS APL DERMABOND .7 (GAUZE/BANDAGES/DRESSINGS) ×1
ARMBAND PINK RESTRICT EXTREMIT (MISCELLANEOUS) ×2 IMPLANT
BLADE SURG ROTATE 9660 (MISCELLANEOUS) ×1 IMPLANT
CANISTER SUCTION 2500CC (MISCELLANEOUS) ×2 IMPLANT
CLIP TI MEDIUM 6 (CLIP) ×2 IMPLANT
CLIP TI WIDE RED SMALL 6 (CLIP) ×2 IMPLANT
COVER PROBE W GEL 5X96 (DRAPES) ×2 IMPLANT
COVER SURGICAL LIGHT HANDLE (MISCELLANEOUS) ×2 IMPLANT
DECANTER SPIKE VIAL GLASS SM (MISCELLANEOUS) ×2 IMPLANT
DERMABOND ADVANCED (GAUZE/BANDAGES/DRESSINGS) ×1
DERMABOND ADVANCED .7 DNX12 (GAUZE/BANDAGES/DRESSINGS) ×1 IMPLANT
DRAIN PENROSE 1/4X12 LTX STRL (WOUND CARE) ×2 IMPLANT
ELECT REM PT RETURN 9FT ADLT (ELECTROSURGICAL) ×2
ELECTRODE REM PT RTRN 9FT ADLT (ELECTROSURGICAL) ×1 IMPLANT
GEL ULTRASOUND 20GR AQUASONIC (MISCELLANEOUS) IMPLANT
GLOVE BIO SURGEON STRL SZ7.5 (GLOVE) ×2 IMPLANT
GLOVE BIOGEL PI IND STRL 7.0 (GLOVE) IMPLANT
GLOVE BIOGEL PI INDICATOR 7.0 (GLOVE) ×3
GLOVE ECLIPSE 6.5 STRL STRAW (GLOVE) ×1 IMPLANT
GLOVE SURG SS PI 6.5 STRL IVOR (GLOVE) ×1 IMPLANT
GOWN STRL REUS W/ TWL LRG LVL3 (GOWN DISPOSABLE) ×3 IMPLANT
GOWN STRL REUS W/TWL LRG LVL3 (GOWN DISPOSABLE) ×2
KIT BASIN OR (CUSTOM PROCEDURE TRAY) ×2 IMPLANT
KIT ROOM TURNOVER OR (KITS) ×2 IMPLANT
LOOP VESSEL MINI RED (MISCELLANEOUS) IMPLANT
NS IRRIG 1000ML POUR BTL (IV SOLUTION) ×2 IMPLANT
PACK CV ACCESS (CUSTOM PROCEDURE TRAY) ×2 IMPLANT
PAD ARMBOARD 7.5X6 YLW CONV (MISCELLANEOUS) ×4 IMPLANT
SPONGE SURGIFOAM ABS GEL 100 (HEMOSTASIS) IMPLANT
SUT PROLENE 6 0 BV (SUTURE) ×1 IMPLANT
SUT PROLENE 7 0 BV 1 (SUTURE) ×2 IMPLANT
SUT VIC AB 3-0 SH 27 (SUTURE) ×2
SUT VIC AB 3-0 SH 27X BRD (SUTURE) ×1 IMPLANT
SUT VICRYL 4-0 PS2 18IN ABS (SUTURE) ×2 IMPLANT
TOWEL OR 17X24 6PK STRL BLUE (TOWEL DISPOSABLE) ×2 IMPLANT
TOWEL OR 17X26 10 PK STRL BLUE (TOWEL DISPOSABLE) ×2 IMPLANT
UNDERPAD 30X30 INCONTINENT (UNDERPADS AND DIAPERS) ×2 IMPLANT
WATER STERILE IRR 1000ML POUR (IV SOLUTION) ×2 IMPLANT

## 2013-07-18 NOTE — Interval H&P Note (Signed)
History and Physical Interval Note:  07/18/2013 12:40 PM  [name]  has presented today for surgery, with the diagnosis of Chronic kidney disease  The various methods of treatment have been discussed with the patient and family. After consideration of risks, benefits and other options for treatment, the patient has consented to  Procedure(s): ARTERIOVENOUS (AV) FISTULA CREATION (Right) as a surgical intervention .  The patient's history has been reviewed, patient examined, no change in status, stable for surgery.  I have reviewed the patient's chart and labs.  Questions were answered to the patient's satisfaction.     Zyhir Cappella E

## 2013-07-18 NOTE — H&P (View-Only) (Signed)
Vascular and Vein [facility] Consult  Reason for Consult:  In need of permanent HD access Referring Physician:  Eliott Nine MRN #:  [id number]  History of Present Illness: This is a 53 y.o. female who present to the IM office yesterday with SOB, orthopnea, and HA. She was found to have  malignant HTN, and acute renal failure.  She has not been compliant with her medications due to lack of insurance coverage.  She does have a hx of diabetes and is on diabeta for this at home.  She states that she was on Metformin in the past, but was taken off of this when her renal function started to decline.  She has never smoked or drank.   Past Medical History  Diagnosis Date  . Hypertension   . Diabetes mellitus   . Anemia    Past Surgical History  Procedure Laterality Date  . Back surgery  17 years   Allergies  Allergen Reactions  . Codeine Other (See Comments)    hallucinations   Prior to Admission medications   Medication Sig Start Date End Date Taking? Authorizing Provider  acetaminophen (TYLENOL) 325 MG tablet Take 650 mg by mouth every 6 (six) hours as needed for fever.   Yes Historical Provider, MD  albuterol (PROVENTIL) (2.5 MG/3ML) 0.083% nebulizer solution Take 2.5 mg by nebulization every 6 (six) hours as needed for wheezing or shortness of breath.   Yes Historical Provider, MD  atenolol (TENORMIN) 50 MG tablet Take 50 mg by mouth 2 (two) times daily.   Yes Historical Provider, MD  felodipine (PLENDIL) 5 MG 24 hr tablet Take 5 mg by mouth at bedtime.   Yes Historical Provider, MD  ferrous gluconate (FERGON) 325 MG tablet Take 325-650 mg by mouth 2 (two) times daily. 2 tablets in the morning and 1 tablet at night   Yes Historical Provider, MD  glyBURIDE (DIABETA) 5 MG tablet Take 10 mg by mouth 2 (two) times daily with a meal.   Yes Historical Provider, MD   History   Social History  . Marital Status: Single    Spouse Name: N/A    Number of Children: N/A  . Years of  Education: N/A   Occupational History  . Not on file.   Social History Main Topics  . Smoking status: Never Smoker   . Smokeless tobacco: Not on file  . Alcohol Use: No  . Drug Use: No  . Sexual Activity: No   Other Topics Concern  . Not on file   Social History Narrative  . No narrative on file   Family Hx: Mother died age 57  hx Stage 4 Ovarian Cancer  Father living age 17 with Parkinson's  ROS: [x]  Positive   [ ]  Negative   [ ]  All sytems reviewed and are negative  Cardiovascular: []  chest pain/pressure []  palpitations [x]  SOB lying flat [x]  DOE []  pain in legs while walking []  pain in legs at rest []  pain in legs at night []  non-healing ulcers []  hx of DVT []  swelling in legs  Pulmonary: []  productive cough []  asthma/wheezing []  home O2 [x]  seasonal asthma  Neurologic: []  weakness in []  arms []  legs []  numbness in []  arms []  legs []  hx of CVA []  mini stroke [] difficulty speaking or slurred speech []  temporary loss of vision in one eye []  dizziness [x]  headache  Hematologic: []  hx of cancer []  bleeding problems []  problems with blood clotting easily  Endocrine:   [x]  diabetes []   thyroid disease  GI []  vomiting blood []  blood in stool  GU: [x]  CKD/renal failure []  HD--[]  M/W/F or []  T/T/S []  burning with urination []  blood in urine  Psychiatric: []  anxiety []  depression  Musculoskeletal: []  arthritis []  joint pain  Integumentary: []  rashes []  ulcers  Constitutional: []  fever []  chills [x]  fatigue [x]  poor appetite  Physical Examination  Filed Vitals:   07/11/13 1143  BP: 195/82  Pulse: 67  Temp: 97.6 F (36.4 C)  Resp: 16   Body mass index is 35.26 kg/(m^2).  General:  WDWN in NAD Gait: Not observed HENT: WNL, normocephalic Eyes: Pupils equal Pulmonary: normal non-labored breathing, without Rales, rhonchi,  wheezing Cardiac: regular, without  Murmurs, rubs or gallops; with bilateral right > left carotid  bruits Abdomen: soft, NT/ND, no masses Skin: without rashes, without ulcers  Vascular Exam/Pulses:  Right Left  Radial 2+ (normal) 2+ (normal)  Ulnar 1+ (weak) 1+ (weak)  Femoral 2+ (normal) Cannot palpate  Popliteal 1+ (weak) Cannot palpate   DP 2+ (normal) trace  PT Cannot palpate Cannot palpate   Extremities: without ischemic changes, without Gangrene , without cellulitis; without open wounds;  Musculoskeletal: no muscle wasting or atrophy  Neurologic: A&O X 3; Appropriate Affect ; SENSATION: normal; MOTOR FUNCTION:  moving all extremities equally. Speech is fluent/normal Psychiatric:  Normal affect; judgement intact. Lymph:  No inguinal lymphadenopathy  CBC    Component Value Date/Time   WBC 5.9 07/11/2013 0340   RBC 4.09 07/11/2013 0340   RBC 4.09 07/11/2013 0340   HGB 10.9* 07/11/2013 0340   HCT 31.6* 07/11/2013 0340   PLT 215 07/11/2013 0340   MCV 77.3* 07/11/2013 0340   MCH 26.7 07/11/2013 0340   MCHC 34.5 07/11/2013 0340   RDW 13.1 07/11/2013 0340   LYMPHSABS 1.0 07/10/2013 1330   MONOABS 0.4 07/10/2013 1330   EOSABS 0.1 07/10/2013 1330   BASOSABS 0.0 07/10/2013 1330   BMET    Component Value Date/Time   NA 133* 07/11/2013 0340   NA 132* 07/11/2013 0340   K 4.5 07/11/2013 0340   K 4.4 07/11/2013 0340   CL 98 07/11/2013 0340   CL 97 07/11/2013 0340   CO2 17* 07/11/2013 0340   CO2 16* 07/11/2013 0340   GLUCOSE 115* 07/11/2013 0340   GLUCOSE 112* 07/11/2013 0340   BUN 64* 07/11/2013 0340   BUN 64* 07/11/2013 0340   CREATININE 5.44* 07/11/2013 0340   CREATININE 5.35* 07/11/2013 0340   CALCIUM 8.9 07/11/2013 0340   CALCIUM 8.8 07/11/2013 0340   GFRNONAA 8* 07/11/2013 0340   GFRNONAA 8* 07/11/2013 0340   GFRAA 9* 07/11/2013 0340   GFRAA 10* 07/11/2013 0340   Non-Invasive Vascular Imaging:    Right Cephalic: Cephalic  Segment  Diameter  Depth  Comment   1. Axilla  4.55mm  mm    2. Mid upper arm  3.58mm  mm    3. Above AC  3.35mm  mm  branch   4. In AC  5.52mm  mm    5. Below AC   2.71mm  mm    6. Mid forearm  2.55mm  mm    7. Wrist  2.4mm  mm     Left arm: Cephalic  Segment  Diameter  Depth  Comment   1. Axilla  1.13mm  mm    2. Mid upper arm  1.39mm  mm    3. Above AC  2.42mm  mm    4. In [facility]  3.51mm  mm    5. Below AC  2.3006mm  mm    6. Mid forearm  1.5567mm  mm  branch   7. Wrist  mm  mm     mm  mm     mm  mm     mm  mm    Basilic  Segment  Diameter  Depth  Comment   1. Axilla  mm  mm    2. Mid upper arm  3.8585mm  13.848mm    3. Above [facility]  2.7853mm  6.3451mm    4. In [facility]  2.966mm  7.7112mm    5. Below AC  1.2366mm  mm    6. Mid forearm  mm  mm    7. Wrist  mm  mm     Statin:  no Beta Blocker:  no Aspirin:  no ACEI:  no ARB:  no Other antiplatelets/anticoagulants:  no  ASSESSMENT: This is a 53 y.o. female with CKD 5 and is in need of permanent HD access  PLAN: -pt is right handed, but right cephalic looks to be the best conduit.  Dr. Edilia Boickson to see pt this afternoon and will decide best procedure for pt. -she does have bilateral carotid bruits with right > left.  Will order carotid duplex.  Doreatha MassedSamantha Marquisha Nikolov, PA-C [facility] [phone]  Agree with above. It looks like her best chance for an AVF is a right brachial-cephalic AVF. I will have them move her IV to the left arm.  Carotid duplex is pending (has carotid bruits). We will not be able to get onto the schedule until early next week. If she goes home before then, we can arrange it as an outpatient.  Waverly Ferrarihristopher Dickson, MD, FACS Beeper [phone] 07/11/2013

## 2013-07-18 NOTE — Progress Notes (Signed)
Report given to angel rn as caregiver 

## 2013-07-18 NOTE — Anesthesia Preprocedure Evaluation (Addendum)
Anesthesia Evaluation  Patient identified by MRN, date of birth, ID band Patient awake    Reviewed: Allergy & Precautions, H&P , NPO status , Patient's Chart, lab work & pertinent test results, reviewed documented beta blocker date and time   History of Anesthesia Complications (+) PROLONGED EMERGENCE and history of anesthetic complications  Airway Mallampati: II TM Distance: >3 FB Neck ROM: Full    Dental  (+) Teeth Intact, Dental Advisory Given   Pulmonary shortness of breath and lying,  breath sounds clear to auscultation        Cardiovascular hypertension, Pt. on medications Rhythm:Regular Rate:Normal     Neuro/Psych    GI/Hepatic negative GI ROS, Neg liver ROS,   Endo/Other  diabetes, Type 2, Insulin Dependent  Renal/GU ESRF and DialysisRenal disease     Musculoskeletal   Abdominal   Peds  Hematology   Anesthesia Other Findings   Reproductive/Obstetrics negative OB ROS                          Anesthesia Physical Anesthesia Plan  ASA: III  Anesthesia Plan: MAC   Post-op Pain Management:    Induction: Intravenous  Airway Management Planned: Natural Airway and Simple Face Mask  Additional Equipment:   Intra-op Plan:   Post-operative Plan:   Informed Consent: I have reviewed the patients History and Physical, chart, labs and discussed the procedure including the risks, benefits and alternatives for the proposed anesthesia with the patient or authorized representative who has indicated his/her understanding and acceptance.     Plan Discussed with: CRNA and Surgeon  Anesthesia Plan Comments:         Anesthesia Quick Evaluation

## 2013-07-18 NOTE — Progress Notes (Signed)
Subjective: Interval History: has no complaints, sleepy post op.  Objective: Vital signs in last 24 hours: Temp:  [98 F (36.7 C)-98.9 F (37.2 C)] 98 F (36.7 C) (04/07 1445) Pulse Rate:  [64-84] 64 (04/07 1439) Resp:  [14-21] 15 (04/07 1439) BP: (153-183)/(68-95) 161/68 mmHg (04/07 1439) SpO2:  [88 %-100 %] 100 % (04/07 1439) Weight:  [85.3 kg (188 lb 0.8 oz)-86.4 kg (190 lb 7.6 oz)] 85.3 kg (188 lb 0.8 oz) (04/06 2315) Weight change: -0.1 kg (-3.5 oz)  Intake/Output from previous day: 04/06 0701 - 04/07 0700 In: -  Out: 1000  Intake/Output this shift: Total I/O In: 500 [I.V.:500] Out: 20 [Blood:20]  General appearance: pale and slowed mentation Resp: diminished breath sounds bilaterally Chest wall: RIJ cath Cardio: S1, S2 normal and systolic murmur: holosystolic 2/6, blowing at apex GI: obese, pos bs, soft Extremities: edema 1+ and AVF B&T RUA  Lab Results:  Recent Labs  07/18/13 0716  WBC 8.5  HGB 9.1*  HCT 27.6*  PLT 181   BMET:  Recent Labs  07/17/13 1441 07/18/13 0716  NA 133* 134*  K 4.4 4.0  CL 92* 94*  CO2 22 22  GLUCOSE 245* 415*  BUN 64* 40*  CREATININE 7.20* 4.87*  CALCIUM 8.8 8.3*    Recent Labs  07/17/13 1441  PTH 439.0*   Iron Studies: No results found for this basename: IRON, TIBC, TRANSFERRIN, FERRITIN,  in the last 72 hours  Studies/Results: No results found.  I have reviewed the patient's current medications.  Assessment/Plan: 1 CKD 5 for HD in am, orders written 2 Anemia on epo, give iv Fe 3 HPTH add Vit D 4 Obesty 5 DM  Work on control 6HTN lower vol cont meds simplified P HD, bp meds, lower vol , IV Vit D    LOS: 8 days   Ettel Albergo L 07/18/2013,3:33 PM

## 2013-07-18 NOTE — Telephone Encounter (Addendum)
Message copied by Fredrich BirksMILLIKAN, DANA P on Tue Jul 18, 2013  3:42 PM ------      Message from: Sherren KernsFIELDS, CHARLES E      Created: Tue Jul 18, 2013  2:14 PM       Needs follow up appt in 6 weeks with duplex of fistula            Leonette Mostharles ------  07/18/13: lm for pt re appt, dpm

## 2013-07-18 NOTE — Progress Notes (Signed)
Patient q 4hr blood sugar was 418. On call Doctor was paged with call back, and stated to give no insulin at this time.  Shirley FriarAlexis Miller, RN, BSN

## 2013-07-18 NOTE — Progress Notes (Signed)
Subjective: Patient arousable this morning but somewhat somnolent.  Apparently had some Ativan earlier this morning for restlessness.  No pain currently.  No abdominal cramping.  Patient had dialysis last evening by history.  Blood sugars are elevated some.  Objective: Weight change: -3.5 oz (-0.1 kg)  Intake/Output Summary (Last 24 hours) at 07/18/13 0726 Last data filed at 07/17/13 2315  Gross per 24 hour  Intake      0 ml  Output   1000 ml  Net  -1000 ml   Filed Vitals:   07/17/13 2230 07/17/13 2315 07/17/13 2342 07/18/13 0516  BP: 161/78 178/85 182/95 167/76  Pulse: 68 73 84 72  Temp:  98.9 F (37.2 C) 98.4 F (36.9 C) 98.8 F (37.1 C)  TempSrc:  Oral Oral Oral  Resp:   18 17  Height:      Weight:  188 lb 0.8 oz (85.3 kg)    SpO2:  95% 98% 88%   General Appearance: Alert, cooperative, no distress, appears stated age  Neck: Right IJ dialysis catheter in place, tunnelled under right upper chest  Extremities: Extremities normal, atraumatic, no cyanosis or edema  Neuro: Arousable and oriented.  Nonfocal   Lab Results: Results for orders placed during the hospital encounter of 07/10/13 (from the past 48 hour(s))  GLUCOSE, CAPILLARY     Status: Abnormal   Collection Time    07/16/13  8:28 AM      Result Value Ref Range   Glucose-Capillary 182 (*) 70 - 99 mg/dL  GLUCOSE, CAPILLARY     Status: Abnormal   Collection Time    07/16/13 12:25 PM      Result Value Ref Range   Glucose-Capillary 178 (*) 70 - 99 mg/dL  GLUCOSE, CAPILLARY     Status: Abnormal   Collection Time    07/16/13  4:43 PM      Result Value Ref Range   Glucose-Capillary 194 (*) 70 - 99 mg/dL   Comment 1 Documented in Chart     Comment 2 Notify RN    GLUCOSE, CAPILLARY     Status: Abnormal   Collection Time    07/16/13  9:51 PM      Result Value Ref Range   Glucose-Capillary 208 (*) 70 - 99 mg/dL  GLUCOSE, CAPILLARY     Status: Abnormal   Collection Time    07/17/13  4:14 AM      Result Value Ref  Range   Glucose-Capillary 329 (*) 70 - 99 mg/dL  GLUCOSE, CAPILLARY     Status: Abnormal   Collection Time    07/17/13  8:13 AM      Result Value Ref Range   Glucose-Capillary 306 (*) 70 - 99 mg/dL  GLUCOSE, CAPILLARY     Status: Abnormal   Collection Time    07/17/13 12:14 PM      Result Value Ref Range   Glucose-Capillary 232 (*) 70 - 99 mg/dL  RENAL FUNCTION PANEL     Status: Abnormal   Collection Time    07/17/13  2:41 PM      Result Value Ref Range   Sodium 133 (*) 137 - 147 mEq/L   Potassium 4.4  3.7 - 5.3 mEq/L   Chloride 92 (*) 96 - 112 mEq/L   CO2 22  19 - 32 mEq/L   Glucose, Bld 245 (*) 70 - 99 mg/dL   BUN 64 (*) 6 - 23 mg/dL   Creatinine, Ser 7.20 (*) 0.50 - 1.10 mg/dL  Calcium 8.8  8.4 - 10.5 mg/dL   Phosphorus 5.2 (*) 2.3 - 4.6 mg/dL   Albumin 2.6 (*) 3.5 - 5.2 g/dL   GFR calc non Af Amer 6 (*) >90 mL/min   GFR calc Af Amer 7 (*) >90 mL/min   Comment: (NOTE)     The eGFR has been calculated using the CKD EPI equation.     This calculation has not been validated in all clinical situations.     eGFR's persistently <90 mL/min signify possible Chronic Kidney     Disease.  GLUCOSE, CAPILLARY     Status: Abnormal   Collection Time    07/17/13  4:47 PM      Result Value Ref Range   Glucose-Capillary 252 (*) 70 - 99 mg/dL  HEPATITIS B SURFACE ANTIGEN     Status: None   Collection Time    07/17/13  8:12 PM      Result Value Ref Range   Hepatitis B Surface Ag NEGATIVE  NEGATIVE   Comment: Performed at [place] ANTIBODY     Status: None   Collection Time    07/17/13  8:12 PM      Result Value Ref Range   Hep B S Ab NEGATIVE  NEGATIVE   Comment: Performed at [place], TOTAL     Status: None   Collection Time    07/17/13  8:12 PM      Result Value Ref Range   Hep B Core Total Ab NON REACTIVE  NON REACTIVE   Comment: Performed at [place], CAPILLARY     Status: Abnormal    Collection Time    07/17/13 11:37 PM      Result Value Ref Range   Glucose-Capillary 242 (*) 70 - 99 mg/dL  GLUCOSE, CAPILLARY     Status: Abnormal   Collection Time    07/18/13  5:13 AM      Result Value Ref Range   Glucose-Capillary 418 (*) 70 - 99 mg/dL    Studies/Results: Dg Chest Port 1 View  07/16/2013   CLINICAL DATA:  Post dialysis catheter placement  EXAM: PORTABLE CHEST - 1 VIEW  COMPARISON:  07/14/2013  FINDINGS: Right-sided dialysis catheter is now noted. No underlying pneumothorax is seen. Inspiratory effort is poor with crowding of the vascular markings. The cardiac shadow is stable. No bony abnormality is noted.  IMPRESSION: Status post dialysis catheter placement in satisfactory position. No acute abnormality is noted.   Electronically Signed   By: Inez Catalina M.D.   On: 07/16/2013 08:49   Dg Fluoro Guide Cv Line-no Report  07/16/2013   CLINICAL DATA: dialysis cath   FLOURO GUIDE CV LINE  Fluoroscopy was utilized by the requesting physician.  No radiographic  interpretation.    Medications: Scheduled Meds: . amLODipine  10 mg Oral QHS  . atenolol  25 mg Oral QHS  . cefUROXime (ZINACEF)  IV  1.5 g Intravenous On Call to OR  . darbepoetin (ARANESP) injection - NON-DIALYSIS  60 mcg Subcutaneous Q Sat-1800  . ferric gluconate (FERRLECIT/NULECIT) IV  125 mg Intravenous Q M,W,F-HD  . insulin aspart  0-9 Units Subcutaneous TID WC  . insulin glargine  10 Units Subcutaneous QHS  . multivitamin  1 tablet Oral QHS  . pantoprazole  40 mg Oral Daily  . polyethylene glycol  17 g Oral Daily   Continuous Infusions: . sodium chloride  PRN Meds:.acetaminophen, acetaminophen, albuterol, bisacodyl, dextrose, HYDROcodone-acetaminophen, HYDROmorphone (DILAUDID) injection, LORazepam, ondansetron (ZOFRAN) IV, ondansetron (ZOFRAN) IV, ondansetron, promethazine, zolpidem  Assessment/Plan:  Principal Problem:  Acute renal failure - renal function continues to decline daily.  Had  dialysis last night. Right IJ dialysis catheter placed 2 days ago.  AV fistula to be created today and her right arm Active Problems:  Hyperkalemia - controlled  DKA (diabetic ketoacidoses) - resolved - CBGs are rising. Increase Lantus to 15 units and increase sliding scale 2 with meals Malignant hypertension -resolved - antihypertensive medication as per nephrology Chronic kidney disease, stage V (severe) - as per nephrology  Noncompliance - aware, will likely be much easier if she can get covered by Medicaid  Anemia - not severe - on Aranesp Dyspnea - improved  CAP - chest x-ray completely clear. Pneumonia may have been just atelectasis. Off antibiotic therapy  Insomnia - low-dose Ambien as needed  Situational anxiety - alprazolam as needed  Depression - continue escitalopram 5 milligrams daily Disposition - continue dialysis and probable discharge home later this week     LOS: 8 days   Henrine Screws, MD 07/18/2013, 7:26 AM

## 2013-07-18 NOTE — Transfer of Care (Signed)
Immediate Anesthesia Transfer of Care Note  Patient: [name]  Procedure(s) Performed: Procedure(s): RIGHT ARTERIOVENOUS (AV) FISTULA CREATION (Right)  Patient Location: PACU  Anesthesia Type:MAC  Level of Consciousness: awake, sedated and patient cooperative  Airway & Oxygen Therapy: Patient Spontanous Breathing and Patient connected to nasal cannula oxygen  Post-op Assessment: Report given to PACU RN and Post -op Vital signs reviewed and stable  Post vital signs: Reviewed and stable  Complications: No apparent anesthesia complications

## 2013-07-18 NOTE — Op Note (Signed)
Procedure: Right Brachial Cephalic AV fistula  Preop: ESRD  Postop: ESRD  Anesthesia: General  Assistant:Tina Ebony Cargolayton, RNFA  Findings: 3.5 mm cephalic vein  Procedure: After obtaining informed consent, the patient was taken to the operating room.  After adequate sedation, the right upper extremity was prepped and draped in usual sterile fashion.  A transverse incision was then made near the antecubital crease the right arm. The incision was carried into the subcutaneous tissues down to level of the cephalic vein. The cephalic vein was approximately 3.5 mm in diameter. It was of good quality. This was dissected free circumferentially and small side branches ligated and divided between silk ties or clips. Next the brachial artery was dissected free in the medial portion of the incision. The artery was  3-4 mm in diameter. The vessel loops were placed proximal and distal to the planned site of arteriotomy. The patient was given 5000 units of intravenous heparin. After appropriate circulation time, the vessel loops were used to control the artery. A longitudinal opening was made in the brachial artery.  The vein was ligated distally with a 2-0 silk tie. The vein was controlled proximally with a fine bulldog clamp. The vein was then swung over to the artery and sewn end of vein to side of artery using a running 7-0 Prolene suture. Just prior to completion of the anastomosis, everything was fore bled back bled and thoroughly flushed. The anastomosis was secured, vessel loops released, and there was a palpable thrill in the fistula immediately. After hemostasis was obtained, the subcutaneous tissues were reapproximated using a running 3-0 Vicryl suture. The skin was then closed with a 4 Vicryl subcuticular stitch. Dermabond was applied to the skin incision.  The patient had a palpable radial pulse at the end of the case.  Instrument sponge and needle count was correct at the end of the case.  Fabienne Brunsharles Jonessa Triplett,  MD [facility] [place] Office: [phone] Pager: [phone]

## 2013-07-19 ENCOUNTER — Other Ambulatory Visit: Payer: Self-pay | Admitting: *Deleted

## 2013-07-19 DIAGNOSIS — Z4931 Encounter for adequacy testing for hemodialysis: Secondary | ICD-10-CM

## 2013-07-19 DIAGNOSIS — N186 End stage renal disease: Secondary | ICD-10-CM

## 2013-07-19 LAB — CBC
HCT: 28.9 % — ABNORMAL LOW (ref 36.0–46.0)
Hemoglobin: 9.6 g/dL — ABNORMAL LOW (ref 12.0–15.0)
MCH: 26.7 pg (ref 26.0–34.0)
MCHC: 33.2 g/dL (ref 30.0–36.0)
MCV: 80.5 fL (ref 78.0–100.0)
PLATELETS: 224 10*3/uL (ref 150–400)
RBC: 3.59 MIL/uL — ABNORMAL LOW (ref 3.87–5.11)
RDW: 13.4 % (ref 11.5–15.5)
WBC: 9.5 10*3/uL (ref 4.0–10.5)

## 2013-07-19 LAB — COMPREHENSIVE METABOLIC PANEL
ALK PHOS: 106 U/L (ref 39–117)
ALT: 7 U/L (ref 0–35)
AST: 12 U/L (ref 0–37)
Albumin: 2.4 g/dL — ABNORMAL LOW (ref 3.5–5.2)
BUN: 48 mg/dL — ABNORMAL HIGH (ref 6–23)
CHLORIDE: 93 meq/L — AB (ref 96–112)
CO2: 22 meq/L (ref 19–32)
Calcium: 8.8 mg/dL (ref 8.4–10.5)
Creatinine, Ser: 5.79 mg/dL — ABNORMAL HIGH (ref 0.50–1.10)
GFR calc Af Amer: 9 mL/min — ABNORMAL LOW (ref >90)
GFR, EST NON AFRICAN AMERICAN: 8 mL/min — AB (ref >90)
Glucose, Bld: 282 mg/dL — ABNORMAL HIGH (ref 70–99)
POTASSIUM: 4.4 meq/L (ref 3.7–5.3)
Sodium: 132 mEq/L — ABNORMAL LOW (ref 137–147)
Total Protein: 6.1 g/dL (ref 6.0–8.3)

## 2013-07-19 LAB — PHOSPHORUS: Phosphorus: 4.8 mg/dL — ABNORMAL HIGH (ref 2.3–4.6)

## 2013-07-19 LAB — GLUCOSE, CAPILLARY
GLUCOSE-CAPILLARY: 283 mg/dL — AB (ref 70–99)
Glucose-Capillary: 172 mg/dL — ABNORMAL HIGH (ref 70–99)
Glucose-Capillary: 276 mg/dL — ABNORMAL HIGH (ref 70–99)
Glucose-Capillary: 280 mg/dL — ABNORMAL HIGH (ref 70–99)
Glucose-Capillary: 341 mg/dL — ABNORMAL HIGH (ref 70–99)
Glucose-Capillary: 367 mg/dL — ABNORMAL HIGH (ref 70–99)

## 2013-07-19 MED ORDER — INSULIN GLARGINE 100 UNIT/ML ~~LOC~~ SOLN
20.0000 [IU] | Freq: Every day | SUBCUTANEOUS | Status: DC
  Administered 2013-07-19 – 2013-07-20 (×2): 20 [IU] via SUBCUTANEOUS
  Filled 2013-07-19 (×3): qty 0.2

## 2013-07-19 MED ORDER — DOXERCALCIFEROL 4 MCG/2ML IV SOLN
INTRAVENOUS | Status: AC
  Administered 2013-07-19: 4 ug via INTRAVENOUS
  Filled 2013-07-19: qty 2

## 2013-07-19 NOTE — Progress Notes (Signed)
[facility] [place] Postoperative hemodialysis access   Date of Surgery:  07/18/13 Surgeon: Darrick PennaFields  Subjective:  Sleeping-feels nauseated this am  PHYSICAL EXAMINATION:  Filed Vitals:   07/19/13 1010  BP: 128/70  Pulse: 69  Temp: 98.4 F (36.9 C)  Resp: 18    Incision is c/d/i Hand grip is strong  Sensation in digits is intact;  There is  Thrill  There is bruit. The graft/fistula is palpable    ASSESSMENT/PLAN:  [name] is a 53 y.o. year old female who is s/p right brachiocephalic AVF.  -graft is patent -pt does not have evidence of steal sx -f/u with Dr. Darrick PennaFields in 4-6 weeks to check maturation of AVF -will sign off-call as needed.   Doreatha MassedSamantha Kanya Potteiger, PA-C [facility] [phone]

## 2013-07-19 NOTE — Care Management Note (Signed)
   CARE MANAGEMENT NOTE 07/19/2013  Patient:  CITLALY, CAMPLIN   Account Number:  [id number]  Date Initiated:  07/11/2013  Documentation initiated by:  Marvetta Gibbons  Subjective/Objective Assessment:   Pt admitted with acute renal failure and DKA  07/19/13 Order for [facility] due to new onset DM and teaching needs.     Action/Plan:   PTA pt lived at home alone- NCM to follow for d/c needs  07/19/13 Met with pt and setup [facility] for [facility].   Anticipated DC Date:  07/22/2013   Anticipated DC Plan:  [place]         Choice offered to / List presented to:             Status of service:  In process, will continue to follow Medicare Important Message given?   (If response is "NO", the following Medicare IM given date fields will be blank) Date Medicare IM given:   Date Additional Medicare IM given:    Discharge Disposition:    Per UR Regulation:  Reviewed for med. necessity/level of care/duration of stay  If discussed at [place] of Stay Meetings, dates discussed:    Comments:  07/19/2013 Met with pt, setup [facility] for ongoing disease management of new onset DM. Per Bethena Roys in HD center, pt has been clipped and application for Medicaid will be completed today. Jasmine Pang RN MPH, Engineer, site, [phone]

## 2013-07-19 NOTE — Progress Notes (Signed)
Subjective: Patient is progressing along.  Blood pressure is controlled.  Only requiring amlodipine and atenolol with the onset of dialysis.  Abdominal cramping has resolved.  Still needs better control of her blood sugars and will increase Lantus and continue sliding scale insulin.  Right brachiocephalic AV fistula formed yesterday.  Objective: Weight change: 0 lb (0 kg)  Intake/Output Summary (Last 24 hours) at 07/19/13 0749 Last data filed at 07/18/13 1835  Gross per 24 hour  Intake    740 ml  Output     20 ml  Net    720 ml   Filed Vitals:   07/18/13 1445 07/18/13 1624 07/18/13 2210 07/19/13 0626  BP:  151/62 176/65 155/66  Pulse:  66 73 66  Temp: 98 F (36.7 C) 98 F (36.7 C) 98.6 F (37 C) 98.3 F (36.8 C)  TempSrc:  Oral Oral Oral  Resp:  17 16 18   Height:      Weight:   190 lb 7.6 oz (86.4 kg)   SpO2:  100% 96% 95%     General Appearance: Alert, cooperative, no distress, appears stated  Neck: Right IJ dialysis catheter in place, tunnelled under right upper chest  Extremities: Extremities normal, atraumatic, no cyanosis or edema.  Surgical wound in right antecubital fossa is dry and nonerythematous.  Palpable thrill felt over nearly formed AV fistula.  Good capillary refill in fingertips and no extremity edema or erythema Abdomen: Soft and nontender and bowel sounds are normal Neuro: Alert and oriented, nonfocal   Lab Results: Results for orders placed during the hospital encounter of 07/10/13 (from the past 48 hour(s))  GLUCOSE, CAPILLARY     Status: Abnormal   Collection Time    07/17/13  8:13 AM      Result Value Ref Range   Glucose-Capillary 306 (*) 70 - 99 mg/dL  GLUCOSE, CAPILLARY     Status: Abnormal   Collection Time    07/17/13 12:14 PM      Result Value Ref Range   Glucose-Capillary 232 (*) 70 - 99 mg/dL  RENAL FUNCTION PANEL     Status: Abnormal   Collection Time    07/17/13  2:41 PM      Result Value Ref Range   Sodium 133 (*) 137 - 147 mEq/L    Potassium 4.4  3.7 - 5.3 mEq/L   Chloride 92 (*) 96 - 112 mEq/L   CO2 22  19 - 32 mEq/L   Glucose, Bld 245 (*) 70 - 99 mg/dL   BUN 64 (*) 6 - 23 mg/dL   Creatinine, Ser 7.20 (*) 0.50 - 1.10 mg/dL   Calcium 8.8  8.4 - 10.5 mg/dL   Phosphorus 5.2 (*) 2.3 - 4.6 mg/dL   Albumin 2.6 (*) 3.5 - 5.2 g/dL   GFR calc non Af Amer 6 (*) >90 mL/min   GFR calc Af Amer 7 (*) >90 mL/min   Comment: (NOTE)     The eGFR has been calculated using the CKD EPI equation.     This calculation has not been validated in all clinical situations.     eGFR's persistently <90 mL/min signify possible Chronic Kidney     Disease.  PARATHYROID HORMONE, INTACT (NO CA)     Status: Abnormal   Collection Time    07/17/13  2:41 PM      Result Value Ref Range   PTH 439.0 (*) 14.0 - 72.0 pg/mL   Comment: Performed at [place]  Status: Abnormal   Collection Time    07/17/13  4:47 PM      Result Value Ref Range   Glucose-Capillary 252 (*) 70 - 99 mg/dL  HEPATITIS B SURFACE ANTIGEN     Status: None   Collection Time    07/17/13  8:12 PM      Result Value Ref Range   Hepatitis B Surface Ag NEGATIVE  NEGATIVE   Comment: Performed at [place] ANTIBODY     Status: None   Collection Time    07/17/13  8:12 PM      Result Value Ref Range   Hep B S Ab NEGATIVE  NEGATIVE   Comment: Performed at [place], TOTAL     Status: None   Collection Time    07/17/13  8:12 PM      Result Value Ref Range   Hep B Core Total Ab NON REACTIVE  NON REACTIVE   Comment: Performed at [place], CAPILLARY     Status: Abnormal   Collection Time    07/17/13 11:37 PM      Result Value Ref Range   Glucose-Capillary 242 (*) 70 - 99 mg/dL  GLUCOSE, CAPILLARY     Status: Abnormal   Collection Time    07/18/13  5:13 AM      Result Value Ref Range   Glucose-Capillary 418 (*) 70 - 99 mg/dL  BASIC METABOLIC PANEL      Status: Abnormal   Collection Time    07/18/13  7:16 AM      Result Value Ref Range   Sodium 134 (*) 137 - 147 mEq/L   Potassium 4.0  3.7 - 5.3 mEq/L   Chloride 94 (*) 96 - 112 mEq/L   CO2 22  19 - 32 mEq/L   Glucose, Bld 415 (*) 70 - 99 mg/dL   BUN 40 (*) 6 - 23 mg/dL   Creatinine, Ser 4.87 (*) 0.50 - 1.10 mg/dL   Calcium 8.3 (*) 8.4 - 10.5 mg/dL   GFR calc non Af Amer 9 (*) >90 mL/min   GFR calc Af Amer 11 (*) >90 mL/min   Comment: (NOTE)     The eGFR has been calculated using the CKD EPI equation.     This calculation has not been validated in all clinical situations.     eGFR's persistently <90 mL/min signify possible Chronic Kidney     Disease.  CBC     Status: Abnormal   Collection Time    07/18/13  7:16 AM      Result Value Ref Range   WBC 8.5  4.0 - 10.5 K/uL   RBC 3.45 (*) 3.87 - 5.11 MIL/uL   Hemoglobin 9.1 (*) 12.0 - 15.0 g/dL   HCT 27.6 (*) 36.0 - 46.0 %   MCV 80.0  78.0 - 100.0 fL   MCH 26.4  26.0 - 34.0 pg   MCHC 33.0  30.0 - 36.0 g/dL   RDW 13.3  11.5 - 15.5 %   Platelets 181  150 - 400 K/uL  GLUCOSE, CAPILLARY     Status: Abnormal   Collection Time    07/18/13  8:44 AM      Result Value Ref Range   Glucose-Capillary 347 (*) 70 - 99 mg/dL  GLUCOSE, CAPILLARY     Status: Abnormal   Collection Time    07/18/13 11:33 AM  Result Value Ref Range   Glucose-Capillary 323 (*) 70 - 99 mg/dL  GLUCOSE, CAPILLARY     Status: Abnormal   Collection Time    07/18/13 12:32 PM      Result Value Ref Range   Glucose-Capillary 335 (*) 70 - 99 mg/dL  GLUCOSE, CAPILLARY     Status: Abnormal   Collection Time    07/18/13  2:25 PM      Result Value Ref Range   Glucose-Capillary 255 (*) 70 - 99 mg/dL   Comment 1 Notify RN    GLUCOSE, CAPILLARY     Status: Abnormal   Collection Time    07/18/13  4:22 PM      Result Value Ref Range   Glucose-Capillary 243 (*) 70 - 99 mg/dL  GLUCOSE, CAPILLARY     Status: Abnormal   Collection Time    07/18/13  8:42 PM      Result  Value Ref Range   Glucose-Capillary 329 (*) 70 - 99 mg/dL  GLUCOSE, CAPILLARY     Status: Abnormal   Collection Time    07/19/13 12:01 AM      Result Value Ref Range   Glucose-Capillary 341 (*) 70 - 99 mg/dL  GLUCOSE, CAPILLARY     Status: Abnormal   Collection Time    07/19/13  4:13 AM      Result Value Ref Range   Glucose-Capillary 276 (*) 70 - 99 mg/dL    Studies/Results: No results found. Medications: Scheduled Meds: . amLODipine  10 mg Oral QHS  . atenolol  25 mg Oral QHS  . darbepoetin (ARANESP) injection - NON-DIALYSIS  60 mcg Subcutaneous Q Sat-1800  . doxercalciferol  4 mcg Intravenous Q M,W,F-HD  . ferric gluconate (FERRLECIT/NULECIT) IV  125 mg Intravenous Q M,W,F-HD  . insulin aspart  0-9 Units Subcutaneous TID WC  . insulin glargine  15 Units Subcutaneous QHS  . multivitamin  1 tablet Oral QHS  . pantoprazole  40 mg Oral Daily  . polyethylene glycol  17 g Oral Daily   Continuous Infusions: . sodium chloride 20 mL/hr at 07/18/13 1228   PRN Meds:.acetaminophen, acetaminophen, albuterol, bisacodyl, dextrose, HYDROcodone-acetaminophen, HYDROmorphone (DILAUDID) injection, LORazepam, ondansetron (ZOFRAN) IV, ondansetron (ZOFRAN) IV, ondansetron, oxyCODONE-acetaminophen, promethazine, zolpidem  Assessment/Plan: Principal Problem:   Acute renal failure Active Problems:   Hyperkalemia   DKA (diabetic ketoacidoses)   Malignant hypertension   Chronic kidney disease, stage IV (severe)   Noncompliance   Anemia   Dyspnea   Principal Problem:  Acute renal failure - renal function continues to decline daily. Had dialysis last night. Right IJ dialysis catheter placed 3 days ago.  AV fistula created yesterday in her right arm  Active Problems:  Hyperkalemia - controlled  DKA (diabetic ketoacidoses) - resolved - CBGs are rising. Increase Lantus to 20 units and continue sliding scale with meals  Malignant hypertension -resolved - antihypertensive medication simple  5 Chronic kidney disease, stage V (severe) - as per nephrology  Noncompliance - aware, will likely be much easier if she can get covered by Medicaid  Anemia - not severe - on Aranesp  Dyspnea - improved  CAP - resolved  Insomnia - low-dose Ambien as needed  Situational anxiety - Alprazolam as needed  Depression - continue escitalopram 5 milligrams daily  Disposition - continue dialysis and probable discharge home later this week    LOS: 9 days   Henrine Screws, MD 07/19/2013, 7:49 AM

## 2013-07-19 NOTE — Progress Notes (Signed)
Pt gone to dialysis via bed.   

## 2013-07-19 NOTE — Anesthesia Postprocedure Evaluation (Signed)
  Anesthesia Post-op Note  Patient: [name]  Procedure(s) Performed: Procedure(s): RIGHT ARTERIOVENOUS (AV) FISTULA CREATION (Right)  Patient Location: PACU  Anesthesia Type:MAC  Level of Consciousness: awake and alert   Airway and Oxygen Therapy: Patient Spontanous Breathing  Post-op Pain: mild  Post-op Assessment: Post-op Vital signs reviewed  Post-op Vital Signs: stable  Last Vitals:  Filed Vitals:   07/19/13 0626  BP: 155/66  Pulse: 66  Temp: 36.8 C  Resp: 18    Complications: No apparent anesthesia complications

## 2013-07-19 NOTE — Progress Notes (Addendum)
Inpatient Diabetes Program Recommendations  AACE/ADA: New Consensus Statement on Inpatient Glycemic Control (2013)  Target Ranges:  Prepandial:   less than 140 mg/dL      Peak postprandial:   less than 180 mg/dL (1-2 hours)      Critically ill patients:  140 - 180 mg/dL     Results for [name], [name] (MRN [id number]) as of 07/19/2013 07:03  Ref. Range 07/18/2013 08:44 07/18/2013 11:33 07/18/2013 12:32 07/18/2013 14:25 07/18/2013 16:22 07/18/2013 20:42  Glucose-Capillary Latest Range: 70-99 mg/dL 409347 (H) 811323 (H) 914335 (H) 255 (H) 243 (H) 329 (H)    Results for [name], [name] (MRN [id number]) as of 07/19/2013 07:03  Ref. Range 07/19/2013 00:01 07/19/2013 04:13  Glucose-Capillary Latest Range: 70-99 mg/dL 086341 (H) 578276 (H)     Note patient's Lantus dose was increased to 15 units QHS last pm.  Patient still having elevated fasting and postprandial glucose levels.  Eating 75-100% of meals.     MD- Please consider the following in-hospital insulin adjustments:  1. Increase Lantus to 20 units QHS 2. Add Novolog meal coverage- Novolog 4 units tid with meals (hold if pt NPO, hold if pt eats <50% of meal)   Will follow Ambrose FinlandJeannine Johnston Basha Krygier RN, MSN, CDE Diabetes Coordinator Inpatient Diabetes Program Team Pager: [phone] (8a-10p)

## 2013-07-19 NOTE — Progress Notes (Signed)
[facility] representative met with patient to arrange [facility] visits. The patient declined the need for [facility].

## 2013-07-19 NOTE — Progress Notes (Signed)
Pt gone down for chest x-ray via wheelchair

## 2013-07-19 NOTE — Progress Notes (Signed)
Subjective: Interval History: has complaints N this am, now better. Wants TTS am,.  Objective: Vital signs in last 24 hours: Temp:  [98 F (36.7 C)-98.6 F (37 C)] 98.4 F (36.9 C) (04/08 1010) Pulse Rate:  [64-73] 69 (04/08 1010) Resp:  [14-18] 18 (04/08 1010) BP: (128-176)/(62-70) 128/70 mmHg (04/08 1010) SpO2:  [90 %-100 %] 90 % (04/08 1010) Weight:  [86.4 kg (190 lb 7.6 oz)] 86.4 kg (190 lb 7.6 oz) (04/07 2210) Weight change: 0 kg (0 lb)  Intake/Output from previous day: 04/07 0701 - 04/08 0700 In: 740 [P.O.:240; I.V.:500] Out: 20 [Blood:20] Intake/Output this shift: Total I/O In: 80 [P.O.:80] Out: -   General appearance: cooperative, moderately obese, pale and slowed mentation Resp: clear to auscultation bilaterally Chest wall: RIJ cath Cardio: S1, S2 normal and systolic murmur: holosystolic 2/6, blowing at apex GI: obese, pos bs, soft, liver down 5 cm Extremities: edema 1+ and AVF RUA B&T  Lab Results:  Recent Labs  07/18/13 0716  WBC 8.5  HGB 9.1*  HCT 27.6*  PLT 181   BMET:  Recent Labs  07/17/13 1441 07/18/13 0716  NA 133* 134*  K 4.4 4.0  CL 92* 94*  CO2 22 22  GLUCOSE 245* 415*  BUN 64* 40*  CREATININE 7.20* 4.87*  CALCIUM 8.8 8.3*    Recent Labs  07/17/13 1441  PTH 439.0*   Iron Studies: No results found for this basename: IRON, TIBC, TRANSFERRIN, FERRITIN,  in the last 72 hours  Studies/Results: No results found.  I have reviewed the patient's current medications.  Assessment/Plan: 1 CKD5 for hd, 3rd,still slow.  CLIP in process. Vol xs yet. 2 DM not well controlled, per Dr. Kevan NyGates 3 Anemia epo, fe 4 HTN improving with lower vol 5 obesity 6 HPTH vit D P hd, epo, check PTH, DM control    LOS: 9 days   [name] L [name] 07/19/2013,11:12 AM

## 2013-07-19 NOTE — Progress Notes (Signed)
07/19/2013 6:06 PM Hemodialysis Outpatient Note;this patient has been [facility] on a Tuesday,Thursday and Saturday 2nd shift schedule. The center at this time does not have a first shift spot opened however they will put Ms [name] on a waiting list. Thank you. Tilman NeatJudith H Jaycie Kregel

## 2013-07-20 ENCOUNTER — Encounter (HOSPITAL_COMMUNITY): Payer: Self-pay | Admitting: Vascular Surgery

## 2013-07-20 LAB — GLUCOSE, CAPILLARY
GLUCOSE-CAPILLARY: 290 mg/dL — AB (ref 70–99)
Glucose-Capillary: 208 mg/dL — ABNORMAL HIGH (ref 70–99)
Glucose-Capillary: 326 mg/dL — ABNORMAL HIGH (ref 70–99)
Glucose-Capillary: 343 mg/dL — ABNORMAL HIGH (ref 70–99)
Glucose-Capillary: 350 mg/dL — ABNORMAL HIGH (ref 70–99)

## 2013-07-20 MED ORDER — AMLODIPINE BESYLATE 5 MG PO TABS
5.0000 mg | ORAL_TABLET | Freq: Every day | ORAL | Status: DC
  Administered 2013-07-20 – 2013-07-25 (×6): 5 mg via ORAL
  Filled 2013-07-20 (×7): qty 1

## 2013-07-20 NOTE — Progress Notes (Signed)
Subjective: Feeling better overall.  Had dialysis last night.  Thinks that she understands her diabetic regimen fairly well.  Objective: Weight change: 3 lb 1.4 oz (1.4 kg)  Intake/Output Summary (Last 24 hours) at 07/20/13 0659 Last data filed at 07/19/13 2007  Gross per 24 hour  Intake    200 ml  Output   3000 ml  Net  -2800 ml   Filed Vitals:   07/19/13 1930 07/19/13 2007 07/19/13 2049 07/20/13 0557  BP: 185/84 182/83 188/82 124/60  Pulse: 79 88 83 68  Temp:  99.2 F (37.3 C) 99.4 F (37.4 C) 98.9 F (37.2 C)  TempSrc:  Oral Oral Oral  Resp:  25 20 20  Height:      Weight:  186 lb 8.2 oz (84.6 kg)    SpO2:  93% 92% 93%    General Appearance: Alert, cooperative, no distress, appears stated  Neck: Right IJ dialysis catheter in place, tunnelled under right upper chest  Extremities: Extremities normal, atraumatic, no cyanosis or edema. Surgical wound in right antecubital fossa is dry and nonerythematous. Palpable thrill felt over nearly formed AV fistula. Good capillary refill in fingertips and no extremity edema or erythema  Abdomen: Soft and nontender and bowel sounds are normal  Neuro: Alert and oriented, nonfocal  Lab Results: Results for orders placed during the hospital encounter of 07/10/13 (from the past 48 hour(s))  BASIC METABOLIC PANEL     Status: Abnormal   Collection Time    07/18/13  7:16 AM      Result Value Ref Range   Sodium 134 (*) 137 - 147 mEq/L   Potassium 4.0  3.7 - 5.3 mEq/L   Chloride 94 (*) 96 - 112 mEq/L   CO2 22  19 - 32 mEq/L   Glucose, Bld 415 (*) 70 - 99 mg/dL   BUN 40 (*) 6 - 23 mg/dL   Creatinine, Ser 4.87 (*) 0.50 - 1.10 mg/dL   Calcium 8.3 (*) 8.4 - 10.5 mg/dL   GFR calc non Af Amer 9 (*) >90 mL/min   GFR calc Af Amer 11 (*) >90 mL/min   Comment: (NOTE)     The eGFR has been calculated using the CKD EPI equation.     This calculation has not been validated in all clinical situations.     eGFR's persistently <90 mL/min signify  possible Chronic Kidney     Disease.  CBC     Status: Abnormal   Collection Time    07/18/13  7:16 AM      Result Value Ref Range   WBC 8.5  4.0 - 10.5 K/uL   RBC 3.45 (*) 3.87 - 5.11 MIL/uL   Hemoglobin 9.1 (*) 12.0 - 15.0 g/dL   HCT 27.6 (*) 36.0 - 46.0 %   MCV 80.0  78.0 - 100.0 fL   MCH 26.4  26.0 - 34.0 pg   MCHC 33.0  30.0 - 36.0 g/dL   RDW 13.3  11.5 - 15.5 %   Platelets 181  150 - 400 K/uL  GLUCOSE, CAPILLARY     Status: Abnormal   Collection Time    07/18/13  8:44 AM      Result Value Ref Range   Glucose-Capillary 347 (*) 70 - 99 mg/dL  GLUCOSE, CAPILLARY     Status: Abnormal   Collection Time    07/18/13 11:33 AM      Result Value Ref Range   Glucose-Capillary 323 (*) 70 - 99 mg/dL  GLUCOSE,   CAPILLARY     Status: Abnormal   Collection Time    07/18/13 12:32 PM      Result Value Ref Range   Glucose-Capillary 335 (*) 70 - 99 mg/dL  GLUCOSE, CAPILLARY     Status: Abnormal   Collection Time    07/18/13  2:25 PM      Result Value Ref Range   Glucose-Capillary 255 (*) 70 - 99 mg/dL   Comment 1 Notify RN    GLUCOSE, CAPILLARY     Status: Abnormal   Collection Time    07/18/13  4:22 PM      Result Value Ref Range   Glucose-Capillary 243 (*) 70 - 99 mg/dL  GLUCOSE, CAPILLARY     Status: Abnormal   Collection Time    07/18/13  8:42 PM      Result Value Ref Range   Glucose-Capillary 329 (*) 70 - 99 mg/dL  GLUCOSE, CAPILLARY     Status: Abnormal   Collection Time    07/19/13 12:01 AM      Result Value Ref Range   Glucose-Capillary 341 (*) 70 - 99 mg/dL  GLUCOSE, CAPILLARY     Status: Abnormal   Collection Time    07/19/13  4:13 AM      Result Value Ref Range   Glucose-Capillary 276 (*) 70 - 99 mg/dL  GLUCOSE, CAPILLARY     Status: Abnormal   Collection Time    07/19/13  7:49 AM      Result Value Ref Range   Glucose-Capillary 283 (*) 70 - 99 mg/dL  GLUCOSE, CAPILLARY     Status: Abnormal   Collection Time    07/19/13 12:25 PM      Result Value Ref Range    Glucose-Capillary 367 (*) 70 - 99 mg/dL  GLUCOSE, CAPILLARY     Status: Abnormal   Collection Time    07/19/13  4:47 PM      Result Value Ref Range   Glucose-Capillary 280 (*) 70 - 99 mg/dL  COMPREHENSIVE METABOLIC PANEL     Status: Abnormal   Collection Time    07/19/13  5:05 PM      Result Value Ref Range   Sodium 132 (*) 137 - 147 mEq/L   Potassium 4.4  3.7 - 5.3 mEq/L   Chloride 93 (*) 96 - 112 mEq/L   CO2 22  19 - 32 mEq/L   Glucose, Bld 282 (*) 70 - 99 mg/dL   BUN 48 (*) 6 - 23 mg/dL   Creatinine, Ser 5.79 (*) 0.50 - 1.10 mg/dL   Calcium 8.8  8.4 - 10.5 mg/dL   Total Protein 6.1  6.0 - 8.3 g/dL   Albumin 2.4 (*) 3.5 - 5.2 g/dL   AST 12  0 - 37 U/L   ALT 7  0 - 35 U/L   Alkaline Phosphatase 106  39 - 117 U/L   Total Bilirubin <0.2 (*) 0.3 - 1.2 mg/dL   GFR calc non Af Amer 8 (*) >90 mL/min   GFR calc Af Amer 9 (*) >90 mL/min   Comment: (NOTE)     The eGFR has been calculated using the CKD EPI equation.     This calculation has not been validated in all clinical situations.     eGFR's persistently <90 mL/min signify possible Chronic Kidney     Disease.  CBC     Status: Abnormal   Collection Time    07/19/13  5:05 PM        Result Value Ref Range   WBC 9.5  4.0 - 10.5 K/uL   RBC 3.59 (*) 3.87 - 5.11 MIL/uL   Hemoglobin 9.6 (*) 12.0 - 15.0 g/dL   HCT 28.9 (*) 36.0 - 46.0 %   MCV 80.5  78.0 - 100.0 fL   MCH 26.7  26.0 - 34.0 pg   MCHC 33.2  30.0 - 36.0 g/dL   RDW 13.4  11.5 - 15.5 %   Platelets 224  150 - 400 K/uL  PHOSPHORUS     Status: Abnormal   Collection Time    07/19/13  5:05 PM      Result Value Ref Range   Phosphorus 4.8 (*) 2.3 - 4.6 mg/dL  GLUCOSE, CAPILLARY     Status: Abnormal   Collection Time    07/19/13  8:41 PM      Result Value Ref Range   Glucose-Capillary 172 (*) 70 - 99 mg/dL   Comment 1 Notify RN    GLUCOSE, CAPILLARY     Status: Abnormal   Collection Time    07/20/13 12:01 AM      Result Value Ref Range   Glucose-Capillary 326 (*) 70 -  99 mg/dL    Studies/Results: No results found. Medications: Scheduled Meds: . amLODipine  10 mg Oral QHS  . atenolol  25 mg Oral QHS  . darbepoetin (ARANESP) injection - NON-DIALYSIS  60 mcg Subcutaneous Q Sat-1800  . doxercalciferol  4 mcg Intravenous Q M,W,F-HD  . ferric gluconate (FERRLECIT/NULECIT) IV  125 mg Intravenous Q M,W,F-HD  . insulin aspart  0-9 Units Subcutaneous TID WC  . insulin glargine  20 Units Subcutaneous QHS  . multivitamin  1 tablet Oral QHS  . pantoprazole  40 mg Oral Daily  . polyethylene glycol  17 g Oral Daily   Continuous Infusions: . sodium chloride 20 mL/hr at 07/18/13 1228   PRN Meds:.acetaminophen, acetaminophen, albuterol, bisacodyl, dextrose, HYDROcodone-acetaminophen, HYDROmorphone (DILAUDID) injection, LORazepam, ondansetron (ZOFRAN) IV, ondansetron (ZOFRAN) IV, ondansetron, oxyCODONE-acetaminophen, promethazine, zolpidem  Assessment/Plan: Principal Problem:   Acute renal failure Active Problems:   Hyperkalemia   DKA (diabetic ketoacidoses)   Malignant hypertension   Chronic kidney disease, stage IV (severe)   Noncompliance   Anemia   Dyspnea   Problem:  Acute renal failure - renal function continues to decline daily. Had dialysis last night. Right IJ dialysis catheter placed 4 days ago.  AV fistula created day before yesterday in her right arm  Active Problems:  Hyperkalemia - controlled  DKA (diabetic ketoacidoses) - resolved - CBGs still high. Lantus 20 units subcutaneous daily and continue sliding scale with meals  Malignant hypertension -resolved Chronic kidney disease, stage V (severe) - as per nephrology  Anemia - not severe - on Aranesp  Dyspnea - improved  CAP - resolved  Insomnia - low-dose Ambien as needed  Situational anxiety - Alprazolam as needed  Depression - continue escitalopram 5 milligrams daily  Disposition - continue dialysis and probable discharge home later this week       LOS: 10 days   Robert Nevill  Gates, MD 07/20/2013, 6:59 AM   

## 2013-07-20 NOTE — Progress Notes (Signed)
Subjective: Interval History: has complaints gets cold easily.  Objective: Vital signs in last 24 hours: Temp:  [98 F (36.7 C)-99.4 F (37.4 C)] 98.5 F (36.9 C) (04/09 0804) Pulse Rate:  [68-88] 75 (04/09 0804) Resp:  [19-25] 20 (04/09 0804) BP: (124-189)/(60-87) 146/78 mmHg (04/09 0804) SpO2:  [92 %-100 %] 93 % (04/09 0804) Weight:  [84.6 kg (186 lb 8.2 oz)-87.8 kg (193 lb 9 oz)] 84.6 kg (186 lb 8.2 oz) (04/08 2007) Weight change: 1.4 kg (3 lb 1.4 oz)  Intake/Output from previous day: 04/08 0701 - 04/09 0700 In: 200 [P.O.:200] Out: 3000  Intake/Output this shift: Total I/O In: 240 [P.O.:240] Out: -   General appearance: alert, cooperative, moderately obese and pale Neck: RIJ cath Chest wall: no R,R or W Cardio: S1, S2 normal and systolic murmur: holosystolic 2/6, blowing at apex GI: obese pos bs, liver down 6 cm Extremities: edema 2+ and AVF RUA B&T  Lab Results:  Recent Labs  07/18/13 0716 07/19/13 1705  WBC 8.5 9.5  HGB 9.1* 9.6*  HCT 27.6* 28.9*  PLT 181 224   BMET:  Recent Labs  07/18/13 0716 07/19/13 1705  NA 134* 132*  K 4.0 4.4  CL 94* 93*  CO2 22 22  GLUCOSE 415* 282*  BUN 40* 48*  CREATININE 4.87* 5.79*  CALCIUM 8.3* 8.8    Recent Labs  07/17/13 1441  PTH 439.0*   Iron Studies: No results found for this basename: IRON, TIBC, TRANSFERRIN, FERRITIN,  in the last 72 hours  Studies/Results: No results found.  I have reviewed the patient's current medications.  Assessment/Plan: 1 CRF  HD in am , full HD.  [facility]  Ok for d/c from renal standpoint 2 Anemia stable 3 DM per Dr. Kevan NyGates 4 HTN coming down, still vol xs 5 HPTH Meds 6 Obesity discussed P HD, epo, Vit D, exercise, lower meds and vol    LOS: 10 days   Mohan Erven L [facility] 07/20/2013,11:20 AM

## 2013-07-20 NOTE — Care Management Note (Signed)
   CARE MANAGEMENT NOTE 07/20/2013  Patient:  [name], [name]   Account Number:  [id number]  Date Initiated:  07/11/2013  Documentation initiated by:  Marvetta Gibbons  Subjective/Objective Assessment:   Pt admitted with acute renal failure and DKA  07/19/13 Order for [facility] due to new onset DM and teaching needs.     Action/Plan:   PTA pt lived at home alone- NCM to follow for d/c needs  07/19/13 Met with pt and setup [facility].   Anticipated DC Date:  07/22/2013   Anticipated DC Plan:  [facility]      Choice offered to / List presented to:          [facility] arranged  HH-1 RN      [place].   Status of service:  Completed, signed off Medicare Important Message given?   (If response is "NO", the following Medicare IM given date fields will be blank) Date Medicare IM given:   Date Additional Medicare IM given:    Discharge Disposition:    Per UR Regulation:  Reviewed for med. necessity/level of care/duration of stay  If discussed at [place] of Stay Meetings, dates discussed:    Comments:  07/19/2013 [place], [place] [phone] [facility] [place] met with patient regarding [facility] visits. The patient declined home health services.  07/19/2013 Met with pt, setup HHRN for ongoing disease management of new onset DM. Per Bethena Roys in HD center, pt has been clipped and application for Medicaid will be completed today. Jasmine Pang RN MPH, Engineer, site, [phone]

## 2013-07-20 NOTE — Plan of Care (Signed)
Problem: Food- and Nutrition-Related Knowledge Deficit (NB-1.1) Goal: Nutrition education Formal process to instruct or train a patient/client in a skill or to impart knowledge to help patients/clients voluntarily manage or modify food choices and eating behavior to maintain or improve health. Outcome: Completed/Met Date Met:  07/20/13  Nutrition Education Note  RD consulted for Renal Education. Provided Choose-A-Meal Booklet to patient/family. Reviewed food groups and provided written recommended serving sizes specifically determined for patient's current nutritional status.   Explained why diet restrictions are needed and provided lists of foods to limit/avoid that are high potassium, sodium, and phosphorus. Provided specific recommendations on safer alternatives of these foods. Strongly encouraged compliance of this diet.   Discussed importance of protein intake at each meal and snack. Provided examples of how to maximize protein intake throughout the day. Discussed need for fluid restriction with dialysis, importance of minimizing weight gain between HD treatments, and renal-friendly beverage options.  Encouraged pt to discuss specific diet questions/concerns with RD at HD outpatient facility. Teach back method used.  Expect good compliance.  Body mass index is 35.26 kg/(m^2). Pt meets criteria for obese class i based on current BMI.  Current diet order is Renal diet, patient is consuming approximately 50% of meals at this time. Labs and medications reviewed. No further nutrition interventions warranted at this time. RD contact information provided. If additional nutrition issues arise, please re-consult RD.  Inda Coke MS, RD, LDN Inpatient Registered Dietitian Pager: [phone] After-hours pager: [phone]

## 2013-07-20 NOTE — Progress Notes (Signed)
Inpatient Diabetes Program Recommendations  AACE/ADA: New Consensus Statement on Inpatient Glycemic Control (2013)  Target Ranges:  Prepandial:   less than 140 mg/dL      Peak postprandial:   less than 180 mg/dL (1-2 hours)      Critically ill patients:  140 - 180 mg/dL    Results for [name], [name] (MRN [id number]) as of 07/20/2013 07:16  Ref. Range 07/19/2013 07:49 07/19/2013 12:25 07/19/2013 16:47 07/19/2013 20:41  Glucose-Capillary Latest Range: 70-99 mg/dL 409283 (H) 811367 (H) 914280 (H) 172 (H)     Note patient's Lantus dose was increased to 20 units QHS last pm. Patient still having elevated fasting and postprandial glucose levels.   Eating 75-100% of meals.    MD- Please consider the following in-hospital insulin adjustments:   1. Add Novolog meal coverage- Novolog 4 units tid with meals (hold if pt NPO, hold if pt eats <50% of meal) 2. Titrate Lantus as needed   Will follow Ambrose FinlandJeannine Johnston Corrissa Martello RN, MSN, CDE Diabetes Coordinator Inpatient Diabetes Program Team Pager: [phone] (8a-10p)

## 2013-07-21 DIAGNOSIS — D631 Anemia in chronic kidney disease: Secondary | ICD-10-CM

## 2013-07-21 DIAGNOSIS — N189 Chronic kidney disease, unspecified: Secondary | ICD-10-CM

## 2013-07-21 DIAGNOSIS — N2581 Secondary hyperparathyroidism of renal origin: Secondary | ICD-10-CM

## 2013-07-21 LAB — RENAL FUNCTION PANEL
ALBUMIN: 2.2 g/dL — AB (ref 3.5–5.2)
BUN: 39 mg/dL — ABNORMAL HIGH (ref 6–23)
CHLORIDE: 94 meq/L — AB (ref 96–112)
CO2: 23 mEq/L (ref 19–32)
Calcium: 8.7 mg/dL (ref 8.4–10.5)
Creatinine, Ser: 6.39 mg/dL — ABNORMAL HIGH (ref 0.50–1.10)
GFR, EST AFRICAN AMERICAN: 8 mL/min — AB (ref >90)
GFR, EST NON AFRICAN AMERICAN: 7 mL/min — AB (ref >90)
Glucose, Bld: 442 mg/dL — ABNORMAL HIGH (ref 70–99)
POTASSIUM: 4.3 meq/L (ref 3.7–5.3)
Phosphorus: 4.8 mg/dL — ABNORMAL HIGH (ref 2.3–4.6)
Sodium: 133 mEq/L — ABNORMAL LOW (ref 137–147)

## 2013-07-21 LAB — CBC
HEMATOCRIT: 25.5 % — AB (ref 36.0–46.0)
Hemoglobin: 8.4 g/dL — ABNORMAL LOW (ref 12.0–15.0)
MCH: 26.7 pg (ref 26.0–34.0)
MCHC: 32.9 g/dL (ref 30.0–36.0)
MCV: 81 fL (ref 78.0–100.0)
PLATELETS: 190 10*3/uL (ref 150–400)
RBC: 3.15 MIL/uL — AB (ref 3.87–5.11)
RDW: 13.6 % (ref 11.5–15.5)
WBC: 6.6 10*3/uL (ref 4.0–10.5)

## 2013-07-21 LAB — GLUCOSE, CAPILLARY
GLUCOSE-CAPILLARY: 189 mg/dL — AB (ref 70–99)
GLUCOSE-CAPILLARY: 479 mg/dL — AB (ref 70–99)
Glucose-Capillary: 416 mg/dL — ABNORMAL HIGH (ref 70–99)
Glucose-Capillary: 264 mg/dL — ABNORMAL HIGH (ref 70–99)
Glucose-Capillary: 350 mg/dL — ABNORMAL HIGH (ref 70–99)

## 2013-07-21 MED ORDER — INSULIN GLARGINE 100 UNIT/ML ~~LOC~~ SOLN
25.0000 [IU] | Freq: Every day | SUBCUTANEOUS | Status: DC
  Administered 2013-07-21 – 2013-07-23 (×3): 25 [IU] via SUBCUTANEOUS
  Filled 2013-07-21 (×3): qty 0.25

## 2013-07-21 MED ORDER — NEPRO/CARBSTEADY PO LIQD: 237.0000 mL | ORAL | Status: DC | PRN

## 2013-07-21 MED ORDER — SODIUM CHLORIDE 0.9 % IV SOLN: 100.0000 mL | INTRAVENOUS | Status: DC | PRN

## 2013-07-21 MED ORDER — LIDOCAINE HCL (PF) 1 % IJ SOLN: 5.0000 mL | INTRAMUSCULAR | Status: DC | PRN

## 2013-07-21 MED ORDER — HEPARIN SODIUM (PORCINE) 1000 UNIT/ML DIALYSIS
1000.0000 [IU] | INTRAMUSCULAR | Status: DC | PRN
Start: 2013-07-21 — End: 2013-07-25

## 2013-07-21 MED ORDER — ALTEPLASE 2 MG IJ SOLR
2.0000 mg | Freq: Once | INTRAMUSCULAR | Status: AC | PRN
  Filled 2013-07-21: qty 2

## 2013-07-21 MED ORDER — HEPARIN SODIUM (PORCINE) 1000 UNIT/ML DIALYSIS
1000.0000 [IU] | INTRAMUSCULAR | Status: DC | PRN
  Filled 2013-07-21: qty 1

## 2013-07-21 MED ORDER — LIDOCAINE-PRILOCAINE 2.5-2.5 % EX CREA: 1.0000 "application " | TOPICAL_CREAM | CUTANEOUS | Status: DC | PRN

## 2013-07-21 MED ORDER — PENTAFLUOROPROP-TETRAFLUOROETH EX AERO: 1.0000 "application " | INHALATION_SPRAY | CUTANEOUS | Status: DC | PRN

## 2013-07-21 MED ORDER — HEPARIN SODIUM (PORCINE) 1000 UNIT/ML DIALYSIS
100.0000 [IU]/kg | INTRAMUSCULAR | Status: DC | PRN
  Administered 2013-07-25: 8500 [IU] via INTRAVENOUS_CENTRAL

## 2013-07-21 MED ORDER — DOXERCALCIFEROL 4 MCG/2ML IV SOLN
INTRAVENOUS | Status: AC
  Filled 2013-07-21: qty 2

## 2013-07-21 MED ORDER — HEPARIN SODIUM (PORCINE) 1000 UNIT/ML DIALYSIS: 40.0000 [IU]/kg | Freq: Once | INTRAMUSCULAR | Status: DC

## 2013-07-21 NOTE — Progress Notes (Signed)
Subjective: Patient had a really tough dialysis and blood sugars are not controlled.  She is having a lot of abdominal and thigh cramping.  Not ready to be discharged today.  Most recent blood sugar 189 but 3 previous were 350-479.  Objective: Weight change: -7 lb 11.5 oz (-3.5 kg)  Intake/Output Summary (Last 24 hours) at 07/21/13 1309 Last data filed at 07/21/13 1040  Gross per 24 hour  Intake    600 ml  Output   2977 ml  Net  -2377 ml   Filed Vitals:   07/21/13 1000 07/21/13 1030 07/21/13 1040 07/21/13 1209  BP: 163/74 135/73 157/81 128/50  Pulse: 68 68 72 71  Temp:   97.8 F (36.6 C) 98.7 F (37.1 C)  TempSrc:   Oral Oral  Resp: 18 14 11 15   Height:      Weight:   179 lb 10.8 oz (81.5 kg)   SpO2:   95% 100%    General Appearance: Alert, cooperative, no distress, appears stated  Neck: Right IJ dialysis catheter in place, tunnelled under right upper chest  Extremities: Extremities normal, atraumatic, no cyanosis or edema. Surgical wound in right antecubital fossa is dry and nonerythematous. Palpable thrill felt over nearly formed AV fistula. Good capillary refill in fingertips and no extremity edema or erythema  Abdomen: Soft, but complaining of abdominal spasm Neuro: Alert and oriented, nonfocal  Lab Results: Results for orders placed during the hospital encounter of 07/10/13 (from the past 48 hour(s))  GLUCOSE, CAPILLARY     Status: Abnormal   Collection Time    07/19/13  4:47 PM      Result Value Ref Range   Glucose-Capillary 280 (*) 70 - 99 mg/dL  COMPREHENSIVE METABOLIC PANEL     Status: Abnormal   Collection Time    07/19/13  5:05 PM      Result Value Ref Range   Sodium 132 (*) 137 - 147 mEq/L   Potassium 4.4  3.7 - 5.3 mEq/L   Chloride 93 (*) 96 - 112 mEq/L   CO2 22  19 - 32 mEq/L   Glucose, Bld 282 (*) 70 - 99 mg/dL   BUN 48 (*) 6 - 23 mg/dL   Creatinine, Ser 5.79 (*) 0.50 - 1.10 mg/dL   Calcium 8.8  8.4 - 10.5 mg/dL   Total Protein 6.1  6.0 - 8.3 g/dL    Albumin 2.4 (*) 3.5 - 5.2 g/dL   AST 12  0 - 37 U/L   ALT 7  0 - 35 U/L   Alkaline Phosphatase 106  39 - 117 U/L   Total Bilirubin <0.2 (*) 0.3 - 1.2 mg/dL   GFR calc non Af Amer 8 (*) >90 mL/min   GFR calc Af Amer 9 (*) >90 mL/min   Comment: (NOTE)     The eGFR has been calculated using the CKD EPI equation.     This calculation has not been validated in all clinical situations.     eGFR's persistently <90 mL/min signify possible Chronic Kidney     Disease.  CBC     Status: Abnormal   Collection Time    07/19/13  5:05 PM      Result Value Ref Range   WBC 9.5  4.0 - 10.5 K/uL   RBC 3.59 (*) 3.87 - 5.11 MIL/uL   Hemoglobin 9.6 (*) 12.0 - 15.0 g/dL   HCT 28.9 (*) 36.0 - 46.0 %   MCV 80.5  78.0 - 100.0 fL  MCH 26.7  26.0 - 34.0 pg   MCHC 33.2  30.0 - 36.0 g/dL   RDW 13.4  11.5 - 15.5 %   Platelets 224  150 - 400 K/uL  PHOSPHORUS     Status: Abnormal   Collection Time    07/19/13  5:05 PM      Result Value Ref Range   Phosphorus 4.8 (*) 2.3 - 4.6 mg/dL  GLUCOSE, CAPILLARY     Status: Abnormal   Collection Time    07/19/13  8:41 PM      Result Value Ref Range   Glucose-Capillary 172 (*) 70 - 99 mg/dL   Comment 1 Notify RN    GLUCOSE, CAPILLARY     Status: Abnormal   Collection Time    07/20/13 12:01 AM      Result Value Ref Range   Glucose-Capillary 326 (*) 70 - 99 mg/dL  GLUCOSE, CAPILLARY     Status: Abnormal   Collection Time    07/20/13  8:01 AM      Result Value Ref Range   Glucose-Capillary 343 (*) 70 - 99 mg/dL  GLUCOSE, CAPILLARY     Status: Abnormal   Collection Time    07/20/13 11:18 AM      Result Value Ref Range   Glucose-Capillary 290 (*) 70 - 99 mg/dL  GLUCOSE, CAPILLARY     Status: Abnormal   Collection Time    07/20/13  4:37 PM      Result Value Ref Range   Glucose-Capillary 208 (*) 70 - 99 mg/dL  GLUCOSE, CAPILLARY     Status: Abnormal   Collection Time    07/20/13  8:13 PM      Result Value Ref Range   Glucose-Capillary 350 (*) 70 - 99 mg/dL   GLUCOSE, CAPILLARY     Status: Abnormal   Collection Time    07/21/13 12:07 AM      Result Value Ref Range   Glucose-Capillary 479 (*) 70 - 99 mg/dL  GLUCOSE, CAPILLARY     Status: Abnormal   Collection Time    07/21/13  4:15 AM      Result Value Ref Range   Glucose-Capillary 416 (*) 70 - 99 mg/dL  CBC     Status: Abnormal   Collection Time    07/21/13  6:54 AM      Result Value Ref Range   WBC 6.6  4.0 - 10.5 K/uL   RBC 3.15 (*) 3.87 - 5.11 MIL/uL   Hemoglobin 8.4 (*) 12.0 - 15.0 g/dL   HCT 25.5 (*) 36.0 - 46.0 %   MCV 81.0  78.0 - 100.0 fL   MCH 26.7  26.0 - 34.0 pg   MCHC 32.9  30.0 - 36.0 g/dL   RDW 13.6  11.5 - 15.5 %   Platelets 190  150 - 400 K/uL  RENAL FUNCTION PANEL     Status: Abnormal   Collection Time    07/21/13  6:55 AM      Result Value Ref Range   Sodium 133 (*) 137 - 147 mEq/L   Potassium 4.3  3.7 - 5.3 mEq/L   Chloride 94 (*) 96 - 112 mEq/L   CO2 23  19 - 32 mEq/L   Glucose, Bld 442 (*) 70 - 99 mg/dL   BUN 39 (*) 6 - 23 mg/dL   Creatinine, Ser 6.39 (*) 0.50 - 1.10 mg/dL   Calcium 8.7  8.4 - 10.5 mg/dL   Phosphorus 4.8 (*) 2.3 -  4.6 mg/dL   Albumin 2.2 (*) 3.5 - 5.2 g/dL   GFR calc non Af Amer 7 (*) >90 mL/min   GFR calc Af Amer 8 (*) >90 mL/min   Comment: (NOTE)     The eGFR has been calculated using the CKD EPI equation.     This calculation has not been validated in all clinical situations.     eGFR's persistently <90 mL/min signify possible Chronic Kidney     Disease.  GLUCOSE, CAPILLARY     Status: Abnormal   Collection Time    07/21/13 12:07 PM      Result Value Ref Range   Glucose-Capillary 189 (*) 70 - 99 mg/dL    Studies/Results: No results found. Medications: Scheduled Meds: . amLODipine  5 mg Oral QHS  . atenolol  25 mg Oral QHS  . darbepoetin (ARANESP) injection - NON-DIALYSIS  60 mcg Subcutaneous Q Sat-1800  . doxercalciferol  4 mcg Intravenous Q M,W,F-HD  . ferric gluconate (FERRLECIT/NULECIT) IV  125 mg Intravenous Q  M,W,F-HD  . insulin aspart  0-9 Units Subcutaneous TID WC  . insulin glargine  20 Units Subcutaneous QHS  . multivitamin  1 tablet Oral QHS  . pantoprazole  40 mg Oral Daily  . polyethylene glycol  17 g Oral Daily   Continuous Infusions: . sodium chloride 20 mL/hr at 07/18/13 1228   PRN Meds:.acetaminophen, acetaminophen, albuterol, bisacodyl, dextrose, HYDROcodone-acetaminophen, HYDROmorphone (DILAUDID) injection, LORazepam, ondansetron (ZOFRAN) IV, ondansetron (ZOFRAN) IV, ondansetron, oxyCODONE-acetaminophen, promethazine, zolpidem  Assessment/Plan: Principal Problem:   Acute renal failure Active Problems:   Hyperkalemia   DKA (diabetic ketoacidoses)   Malignant hypertension   Chronic kidney disease, stage IV (severe)   Noncompliance   Anemia   Dyspnea  Problem:  Acute renal failure - renal function continues to decline daily. Had dialysis last night. Right IJ dialysis catheter placed 4 days ago.  AV fistula created day before yesterday in her right arm  Active Problems: Abdominal cramping/spasm: Hopefully will improve over the next day or so.  Seems to be related to dialysis. Hyperkalemia - controlled  DKA (diabetic ketoacidoses) - resolved - CBGs still high.  Increase Lantus to 25 units subcutaneous daily and continue sliding scale with meals  Malignant hypertension -resolved  Chronic kidney disease, stage V (severe) - as per nephrology  Anemia - not severe - on Aranesp  Dyspnea - improved  CAP - resolved  Insomnia - low-dose Ambien as needed  Situational anxiety - Alprazolam as needed  Depression - continue escitalopram 5 milligrams daily  Disposition - continue dialysis and probable discharge home Saturday or Sunday   LOS: 11 days   Henrine Screws, MD 07/21/2013, 1:09 PM

## 2013-07-21 NOTE — Progress Notes (Signed)
Subjective: Interval History: has complaints cramping late in HD.  Objective: Vital signs in last 24 hours: Temp:  [97.8 F (36.6 C)-98.6 F (37 C)] 98.3 F (36.8 C) (04/10 0637) Pulse Rate:  [63-75] 68 (04/10 1000) Resp:  [15-21] 18 (04/10 1000) BP: (122-163)/(66-83) 163/74 mmHg (04/10 1000) SpO2:  [94 %-98 %] 95 % (04/10 0637) Weight:  [84.3 kg (185 lb 13.6 oz)] 84.3 kg (185 lb 13.6 oz) (04/09 2053) Weight change: -3.5 kg (-7 lb 11.5 oz)  Intake/Output from previous day: 04/09 0701 - 04/10 0700 In: 840 [P.O.:840] Out: -  Intake/Output this shift:    General appearance: alert, cooperative, moderately obese and pale Resp: rales bibasilar Cardio: S1, S2 normal and systolic murmur: holosystolic 2/6, blowing at apex GI: pos bs, liver down 4 cm Extremities: edema 2+.  and AVF RUA  Lab Results:  Recent Labs  07/19/13 1705 07/21/13 0654  WBC 9.5 6.6  HGB 9.6* 8.4*  HCT 28.9* 25.5*  PLT 224 190   BMET:  Recent Labs  07/19/13 1705 07/21/13 0655  NA 132* 133*  K 4.4 4.3  CL 93* 94*  CO2 22 23  GLUCOSE 282* 442*  BUN 48* 39*  CREATININE 5.79* 6.39*  CALCIUM 8.8 8.7   No results found for this basename: PTH,  in the last 72 hours Iron Studies: No results found for this basename: IRON, TIBC, TRANSFERRIN, FERRITIN,  in the last 72 hours  Studies/Results: No results found.  I have reviewed the patient's current medications.  Assessment/Plan: 1 CRF for HD.  Vol xs but cramping. Will lower vol infuture.   Ok for D/C to [facility] 2 DM 3 HTN better with lower vol 4 Nonadherence suspect  Will cont to be issue 5 Anemia epo/fe 6 HPTH  meds P HD, epo, fe, d/c soon    LOS: 11 days   Louetta Hollingshead L Roxy Mastandrea 07/21/2013,10:32 AM

## 2013-07-21 NOTE — Procedures (Signed)
I was present at this session.  I have reviewed the session itself and made appropriate changes. Cramping after 3,5 l.  Still vol xs , will slowly lower.  1st full HD  Llana AlimentJames L Cecille Mcclusky 4/10/201510:37 AM

## 2013-07-21 NOTE — Progress Notes (Signed)
Inpatient Diabetes Program Recommendations  AACE/ADA: New Consensus Statement on Inpatient Glycemic Control (2013)  Target Ranges:  Prepandial:   less than 140 mg/dL      Peak postprandial:   less than 180 mg/dL (1-2 hours)      Critically ill patients:  140 - 180 mg/dL   Results for [name], [name] (MRN [id number]) as of 07/21/2013 12:49  Ref. Range 07/20/2013 08:01 07/20/2013 11:18 07/20/2013 16:37 07/20/2013 20:13 07/21/2013 00:07 07/21/2013 04:15 07/21/2013 12:07  Glucose-Capillary Latest Range: 70-99 mg/dL 782343 (H) 956290 (H) 213208 (H) 350 (H) 479 (H) 416 (H) 189 (H)   Diabetes history: DM2 Outpatient Diabetes medications: Glyburide 10mg  BID Current orders for Inpatient glycemic control: Lantus 20 units QHS, Novolog 0-9 units AC  Inpatient Diabetes Program Recommendations Insulin - Basal: Please consider increasing Lantus to 22 units QHS. Insulin - Meal Coverage: Please consider ordering Novolog meal coverage; recommend ordering Novolog 4 units tid with meals (hold if pt NPO, hold if pt eats <50% of meal). Insulin-Correction: Please consider ordering Novolog bedtime correction scale.  Thanks, Orlando PennerMarie Lamir Racca, RN, MSN, CCRN Diabetes Coordinator Inpatient Diabetes Program [phone] (Team Pager) [phone] (AP office) [phone] [facility] office)

## 2013-07-22 LAB — GLUCOSE, CAPILLARY
GLUCOSE-CAPILLARY: 488 mg/dL — AB (ref 70–99)
Glucose-Capillary: 212 mg/dL — ABNORMAL HIGH (ref 70–99)
Glucose-Capillary: 287 mg/dL — ABNORMAL HIGH (ref 70–99)
Glucose-Capillary: 426 mg/dL — ABNORMAL HIGH (ref 70–99)
Glucose-Capillary: 508 mg/dL — ABNORMAL HIGH (ref 70–99)

## 2013-07-22 MED ORDER — INSULIN ASPART 100 UNIT/ML ~~LOC~~ SOLN: 0.0000 [IU] | Freq: Three times a day (TID) | SUBCUTANEOUS | Status: DC

## 2013-07-22 MED ORDER — INSULIN ASPART 100 UNIT/ML ~~LOC~~ SOLN
0.0000 [IU] | Freq: Three times a day (TID) | SUBCUTANEOUS | Status: DC
Start: 2013-07-22 — End: 2013-07-22

## 2013-07-22 MED ORDER — INSULIN ASPART 100 UNIT/ML ~~LOC~~ SOLN
0.0000 [IU] | Freq: Three times a day (TID) | SUBCUTANEOUS | Status: DC
  Administered 2013-07-22: 5 [IU] via SUBCUTANEOUS
  Administered 2013-07-22 (×2): 9 [IU] via SUBCUTANEOUS
  Administered 2013-07-22: 13:00:00 via SUBCUTANEOUS
  Administered 2013-07-23: 9 [IU] via SUBCUTANEOUS
  Administered 2013-07-23: 7 [IU] via SUBCUTANEOUS
  Administered 2013-07-23: 9 [IU] via SUBCUTANEOUS
  Administered 2013-07-23 – 2013-07-24 (×3): 5 [IU] via SUBCUTANEOUS
  Administered 2013-07-24: 7 [IU] via SUBCUTANEOUS
  Administered 2013-07-24: 3 [IU] via SUBCUTANEOUS
  Administered 2013-07-25: 7 [IU] via SUBCUTANEOUS
  Administered 2013-07-25: 3 [IU] via SUBCUTANEOUS
  Administered 2013-07-26: 5 [IU] via SUBCUTANEOUS

## 2013-07-22 MED ORDER — INSULIN ASPART 100 UNIT/ML ~~LOC~~ SOLN
11.0000 [IU] | Freq: Once | SUBCUTANEOUS | Status: AC
  Administered 2013-07-22: 11 [IU] via SUBCUTANEOUS

## 2013-07-22 NOTE — Progress Notes (Signed)
Subjective: Interval History: has complaints concerned of bs fluct. Has done better with OHA with insulin in past.  Also discussed cramping at hd.  Objective: Vital signs in last 24 hours: Temp:  [98.7 F (37.1 C)-99.7 F (37.6 C)] 98.7 F (37.1 C) (04/11 0811) Pulse Rate:  [70-77] 72 (04/11 0811) Resp:  [15-18] 17 (04/11 0811) BP: (128-156)/(50-72) 156/72 mmHg (04/11 0811) SpO2:  [98 %-100 %] 99 % (04/11 0811) Weight change: -2.8 kg (-6 lb 2.8 oz)  Intake/Output from previous day: 04/10 0701 - 04/11 0700 In: -  Out: 2977  Intake/Output this shift:    General appearance: alert, cooperative, moderately obese and pale Neck: RIJ cath Resp: clear to auscultation bilaterally Cardio: S1, S2 normal and systolic murmur: holosystolic 2/6, blowing at apex GI: obese, pos bs, liver down 6 cm Extremities: edema o and none  Lab Results:  Recent Labs  07/19/13 1705 07/21/13 0654  WBC 9.5 6.6  HGB 9.6* 8.4*  HCT 28.9* 25.5*  PLT 224 190   BMET:  Recent Labs  07/19/13 1705 07/21/13 0655  NA 132* 133*  K 4.4 4.3  CL 93* 94*  CO2 22 23  GLUCOSE 282* 442*  BUN 48* 39*  CREATININE 5.79* 6.39*  CALCIUM 8.8 8.7   No results found for this basename: PTH,  in the last 72 hours Iron Studies: No results found for this basename: IRON, TIBC, TRANSFERRIN, FERRITIN,  in the last 72 hours  Studies/Results: No results found.  Reviewed meds  Assessment/Plan: 1  CRF for TTS will try to get to Tues.  Vol ok.  Have arrived at dry. Also try profile  2 HTN  Controlled with current vol bp meds 3 DM per Dr. Kevan NyGates,  Suggest adding Januvia or tradjenta 4 Anemia epo/fe 5 Hpth med P HD Tues, bp meds, dm control.? OHA    LOS: 12 days   Airyanna Dipalma L Jonavan Vanhorn 07/22/2013,11:42 AM

## 2013-07-23 DIAGNOSIS — N63 Unspecified lump in unspecified breast: Secondary | ICD-10-CM | POA: Diagnosis not present

## 2013-07-23 LAB — GLUCOSE, CAPILLARY
GLUCOSE-CAPILLARY: 375 mg/dL — AB (ref 70–99)
GLUCOSE-CAPILLARY: 300 mg/dL — AB (ref 70–99)
GLUCOSE-CAPILLARY: 310 mg/dL — AB (ref 70–99)
GLUCOSE-CAPILLARY: 410 mg/dL — AB (ref 70–99)

## 2013-07-23 MED ORDER — INSULIN ASPART 100 UNIT/ML ~~LOC~~ SOLN
3.0000 [IU] | Freq: Once | SUBCUTANEOUS | Status: AC
  Administered 2013-07-23: 3 [IU] via SUBCUTANEOUS

## 2013-07-23 NOTE — Progress Notes (Signed)
Subjective: Interval History: has complaints upset stom.  Spent 35 min answering questions and education..  Objective: Vital signs in last 24 hours: Temp:  [98.7 F (37.1 C)-99.7 F (37.6 C)] 98.7 F (37.1 C) (04/12 0816) Pulse Rate:  [66-75] 66 (04/12 0816) Resp:  [18-20] 19 (04/12 0816) BP: (143-180)/(69-83) 157/82 mmHg (04/12 0816) SpO2:  [95 %-98 %] 97 % (04/12 0816) Weight change:   Intake/Output from previous day: 04/11 0701 - 04/12 0700 In: 480 [P.O.:480] Out: -  Intake/Output this shift:    General appearance: cooperative, fatigued, moderately obese, pale and slowed mentation Neck: RIJ cath Resp: clear to auscultation bilaterally Cardio: S1, S2 normal and systolic murmur: holosystolic 2/6, blowing at apex GI: soft, pos bs, liver down 6 cm Extremities: AVF RUA, Tr edema  Lab Results:  Recent Labs  07/21/13 0654  WBC 6.6  HGB 8.4*  HCT 25.5*  PLT 190   BMET:  Recent Labs  07/21/13 0655  NA 133*  K 4.3  CL 94*  CO2 23  GLUCOSE 442*  BUN 39*  CREATININE 6.39*  CALCIUM 8.7   No results found for this basename: PTH,  in the last 72 hours Iron Studies: No results found for this basename: IRON, TIBC, TRANSFERRIN, FERRITIN,  in the last 72 hours  Studies/Results: No results found.  I have reviewed the patient's current medications.  Assessment/Plan: 1 ESRD for HD on Tues.  [facility]  Vol ok. Access maturing. 2 DM per Dr. Kevan NyGates 3 Breast mass eval per Dr. Kevan NyGates 4 HTN controlled 5 Anemia 6 HPTH meds P HD tues, epo, vit D, education , BS control, eval breast mass.     LOS: 13 days   Zeth Buday L Zeno Hickel 07/23/2013,11:23 AM

## 2013-07-23 NOTE — Progress Notes (Signed)
Assessment/Plan: Active Problems:   Type II or unspecified type diabetes mellitus with renal manifestations, uncontrolled - sugars still quite high. Note Dr. Deterding's recommendation about oral agent which is completely appropriate. I will leave to Dr. Kevan NyGates to make that decision as we will need to provide samples until she is on Medicare +/- Medicaid to help her with costs.    Malignant hypertension   ESRD on dialysis - biggest issue is muscle cramping. I told her about tonic water and asked her to bring this up with Dr. Darrick Pennaeterding.    Noncompliance   Dyspnea   Anemia of chronic renal failure   Secondary hyperparathyroidism   Breast mass - newly found today and quite firm and worrisome. I told her she would need diagnostic mammogram once out of hospital. This will likely need bx.    Subjective: Had muscle cramping last night. Very severe. This morning is nauseated without abdominal pain or diarrhea.   Also, found a mass in the left upper breast.   Objective:  Vital Signs: Filed Vitals:   07/22/13 1634 07/22/13 2149 07/23/13 0448 07/23/13 0816  BP: 162/77 180/83 179/78 157/82  Pulse: 71 75 71 66  Temp: 99.7 F (37.6 C) 98.7 F (37.1 C) 99 F (37.2 C) 98.7 F (37.1 C)  TempSrc: Oral Oral Oral Oral  Resp: 18 18 20 19   Height:      Weight:      SpO2: 97% 96% 95% 97%     EXAM: 2 cm mass in left upper breast.    Intake/Output Summary (Last 24 hours) at 07/23/13 1023 Last data filed at 07/22/13 1300  Gross per 24 hour  Intake    240 ml  Output      0 ml  Net    240 ml    Lab Results:  Recent Labs  07/21/13 0655  NA 133*  K 4.3  CL 94*  CO2 23  GLUCOSE 442*  BUN 39*  CREATININE 6.39*  CALCIUM 8.7  PHOS 4.8*    Recent Labs  07/21/13 0655  ALBUMIN 2.2*   No results found for this basename: LIPASE, AMYLASE,  in the last 72 hours  Recent Labs  07/21/13 0654  WBC 6.6  HGB 8.4*  HCT 25.5*  MCV 81.0  PLT 190   No results found for this basename:  CKTOTAL, CKMB, CKMBINDEX, TROPONINI,  in the last 72 hours BNP    Component Value Date/Time   PROBNP 15005.0* 07/10/2013 1330   No results found for this basename: DDIMER,  in the last 72 hours No results found for this basename: HGBA1C,  in the last 72 hours No results found for this basename: CHOL, HDL, LDLCALC, TRIG, CHOLHDL, LDLDIRECT,  in the last 72 hours No results found for this basename: TSH, T4TOTAL, FREET3, T3FREE, THYROIDAB,  in the last 72 hours No results found for this basename: VITAMINB12, FOLATE, FERRITIN, TIBC, IRON, RETICCTPCT,  in the last 72 hours  Studies/Results: No results found. Medications: Medications administered in the last 24 hours reviewed.  Current Medication List reviewed.    LOS: 13 days   Lilli LightJames Charles Menaal [facility] [phone]) 07/23/2013, 10:23 AM

## 2013-07-24 LAB — GLUCOSE, CAPILLARY
Glucose-Capillary: 246 mg/dL — ABNORMAL HIGH (ref 70–99)
Glucose-Capillary: 258 mg/dL — ABNORMAL HIGH (ref 70–99)
Glucose-Capillary: 275 mg/dL — ABNORMAL HIGH (ref 70–99)
Glucose-Capillary: 279 mg/dL — ABNORMAL HIGH (ref 70–99)
Glucose-Capillary: 311 mg/dL — ABNORMAL HIGH (ref 70–99)
Glucose-Capillary: 385 mg/dL — ABNORMAL HIGH (ref 70–99)

## 2013-07-24 MED ORDER — INSULIN GLARGINE 100 UNIT/ML ~~LOC~~ SOLN
30.0000 [IU] | Freq: Every day | SUBCUTANEOUS | Status: DC
  Administered 2013-07-24 – 2013-07-25 (×2): 30 [IU] via SUBCUTANEOUS
  Filled 2013-07-24 (×2): qty 0.3

## 2013-07-24 MED ORDER — DOXERCALCIFEROL 4 MCG/2ML IV SOLN
4.0000 ug | INTRAVENOUS | Status: DC
  Administered 2013-07-25: 4 ug via INTRAVENOUS
  Filled 2013-07-24: qty 2

## 2013-07-24 MED ORDER — SODIUM CHLORIDE 0.9 % IV SOLN
125.0000 mg | INTRAVENOUS | Status: DC
  Administered 2013-07-25: 125 mg via INTRAVENOUS
  Filled 2013-07-24 (×3): qty 10

## 2013-07-24 MED ORDER — HEPARIN SODIUM (PORCINE) 1000 UNIT/ML DIALYSIS: 20.0000 [IU]/kg | INTRAMUSCULAR | Status: DC | PRN

## 2013-07-24 MED ORDER — LINAGLIPTIN 5 MG PO TABS
5.0000 mg | ORAL_TABLET | Freq: Every day | ORAL | Status: DC
  Administered 2013-07-24 – 2013-07-25 (×2): 5 mg via ORAL
  Filled 2013-07-24 (×3): qty 1

## 2013-07-24 NOTE — Progress Notes (Addendum)
Inpatient Diabetes Program Recommendations  AACE/ADA: New Consensus Statement on Inpatient Glycemic Control (2013)  Target Ranges:  Prepandial:   less than 140 mg/dL      Peak postprandial:   less than 180 mg/dL (1-2 hours)      Critically ill patients:  140 - 180 mg/dL     Results for [name], Ramonica J (MRN [id number]) as of 07/24/2013 10:11  Ref. Range 07/23/2013 08:14 07/23/2013 11:47 07/23/2013 16:59 07/23/2013 20:59  Glucose-Capillary Latest Range: 70-99 mg/dL 409375 (H) 811300 (H) 914310 (H) 410 (H)    Results for [name], [name] (MRN [id number]) as of 07/24/2013 10:11  Ref. Range 07/24/2013 07:36  Glucose-Capillary Latest Range: 70-99 mg/dL 086311 (H)     Note that Dr. Kevan NyGates increased patient's Lantus to 30 units QHS today (will get increased dose tonight).    Note also that Dr. Kevan NyGates started patient on Tradjenta 5 mg daily this morning as well.  Patient continues to have elevated fasting and postprandial glucose levels.   MD- Please consider starting patient on set dose of Novolog meal coverage while here in hospital- Novolog 4 units tid with meals     Addendum 2:03pm:  Received referral for this patient.  Spoke with patient this afternoon.  Patient stated she was frustrated about not knowing what insulins Dr. Kevan NyGates will be sending her home on.  Patient states she knows how to use insulin and has given insulin in the past, however, she just wants to know what kind of insulin she will be discharged home on.  Explained how both Lantus and Novolog work and explained how to use a SSI regimen with pt.  Patient told me she cannot afford Lantus and Novolog b/c they are too expensive.  Patient told me she doesn't know when she will get her Medicaid.    I spoke with the care manager on unit 6E and the care manager informed me that it may take up to 60 days for pt to get her Medicaid approved.  In the meantime, care management told me that the pt can get 30 days worth of free medications through  the [facility] program.  Not sure if patient will be able to get samples of insulins through Dr. Kevan NyGates' office for the remaining 30 days until her Medicaid starts??  We could send pt home on 70/30 insulin from [employer] (Reli-on brand of 70/30 is $25 per vial) bid with breakfast and supper until she gets her Medicaid and then convert her back to Lantus and Novolog once her Medicaid comes through.      Dr Tresa MooreGates--Can patient get samples of Lantus and Novolog and Tradjenta through your office to cover her for 30 days?   Will follow Ambrose FinlandJeannine Johnston Jenella Craigie RN, MSN, CDE Diabetes Coordinator Inpatient Diabetes Program Team Pager: [phone] (8a-10p)

## 2013-07-24 NOTE — Progress Notes (Addendum)
Subjective: Patient complaining of a.m. nausea on successive days.  Still unsure about how to take insulin.  Nausea she did this a.m.  Blood sugars are still running high.  She is due for dialysis tomorrow and did not have dialysis over the weekend.  Patient did note a left upper outer quadrant breast mass this weekend  Objective: Weight change:   Intake/Output Summary (Last 24 hours) at 07/24/13 0738 Last data filed at 07/24/13 0600  Gross per 24 hour  Intake    340 ml  Output      0 ml  Net    340 ml   Filed Vitals:   07/23/13 1148 07/23/13 1700 07/23/13 2101 07/24/13 0418  BP: 147/77 137/76 151/80 160/83  Pulse: 67 69 69 67  Temp: 97.8 F (36.6 C) 98.2 F (36.8 C) 98.7 F (37.1 C) 98.7 F (37.1 C)  TempSrc: Oral Oral Oral Oral  Resp: 18 18 18 17   Height:      Weight:      SpO2: 95% 92% 95% 97%    General Appearance: Alert, cooperative, no distress, appears stated age Lungs: Clear to auscultation bilaterally, respirations unlabored Breast: Left upper outer quadrant breast mass is present.  Appears to be in a period drop shape with the larger in more lateral and inferior in the upper and more medial and superior.  No axillary adenopathy noted or supraclavicular adenopathy noted Heart: Regular rate and rhythm, S1 and S2 normal, no murmur, rub or gallop Abdomen: Obese, soft, non-tender, bowel sounds active all four quadrants, no masses, no organomegaly Extremities: Extremities normal, atraumatic, no cyanosis or edema Neuro: Oriented x3, nonfocal  Lab Results: Results for orders placed during the hospital encounter of 07/10/13 (from the past 48 hour(s))  GLUCOSE, CAPILLARY     Status: Abnormal   Collection Time    07/22/13  8:06 AM      Result Value Ref Range   Glucose-Capillary 287 (*) 70 - 99 mg/dL  GLUCOSE, CAPILLARY     Status: Abnormal   Collection Time    07/22/13 11:57 AM      Result Value Ref Range   Glucose-Capillary 212 (*) 70 - 99 mg/dL  GLUCOSE, CAPILLARY      Status: Abnormal   Collection Time    07/22/13  4:33 PM      Result Value Ref Range   Glucose-Capillary 426 (*) 70 - 99 mg/dL  GLUCOSE, CAPILLARY     Status: Abnormal   Collection Time    07/22/13  9:47 PM      Result Value Ref Range   Glucose-Capillary 488 (*) 70 - 99 mg/dL  GLUCOSE, CAPILLARY     Status: Abnormal   Collection Time    07/23/13  8:14 AM      Result Value Ref Range   Glucose-Capillary 375 (*) 70 - 99 mg/dL  GLUCOSE, CAPILLARY     Status: Abnormal   Collection Time    07/23/13 11:47 AM      Result Value Ref Range   Glucose-Capillary 300 (*) 70 - 99 mg/dL  GLUCOSE, CAPILLARY     Status: Abnormal   Collection Time    07/23/13  4:59 PM      Result Value Ref Range   Glucose-Capillary 310 (*) 70 - 99 mg/dL  GLUCOSE, CAPILLARY     Status: Abnormal   Collection Time    07/23/13  8:59 PM      Result Value Ref Range   Glucose-Capillary 410 (*) 70 -  99 mg/dL  GLUCOSE, CAPILLARY     Status: Abnormal   Collection Time    07/24/13 12:50 AM      Result Value Ref Range   Glucose-Capillary 385 (*) 70 - 99 mg/dL  GLUCOSE, CAPILLARY     Status: Abnormal   Collection Time    07/24/13  4:15 AM      Result Value Ref Range   Glucose-Capillary 275 (*) 70 - 99 mg/dL    Studies/Results: No results found. Medications: Scheduled Meds: . amLODipine  5 mg Oral QHS  . atenolol  25 mg Oral QHS  . darbepoetin (ARANESP) injection - NON-DIALYSIS  60 mcg Subcutaneous Q Sat-1800  . doxercalciferol  4 mcg Intravenous Q M,W,F-HD  . ferric gluconate (FERRLECIT/NULECIT) IV  125 mg Intravenous Q M,W,F-HD  . heparin  40 Units/kg Dialysis Once in dialysis  . insulin aspart  0-9 Units Subcutaneous TID AC & HS  . insulin glargine  30 Units Subcutaneous QHS  . linagliptin  5 mg Oral Daily  . multivitamin  1 tablet Oral QHS  . pantoprazole  40 mg Oral Daily  . polyethylene glycol  17 g Oral Daily   Continuous Infusions: . sodium chloride 20 mL/hr at 07/18/13 1228   PRN Meds:.sodium  chloride, sodium chloride, sodium chloride, sodium chloride, acetaminophen, acetaminophen, albuterol, bisacodyl, dextrose, feeding supplement (NEPRO CARB STEADY), feeding supplement (NEPRO CARB STEADY), heparin, heparin, heparin, HYDROcodone-acetaminophen, HYDROmorphone (DILAUDID) injection, lidocaine (PF), lidocaine (PF), lidocaine-prilocaine, lidocaine-prilocaine, LORazepam, ondansetron (ZOFRAN) IV, ondansetron (ZOFRAN) IV, ondansetron oxyCODONE-acetaminophen, pentafluoroprop-tetrafluoroeth, pentafluoroprop-tetrafluoroeth, promethazine, zolpidem  Assessment/Plan: Active Problems:   Type II or unspecified type diabetes mellitus with renal manifestations, uncontrolled - increase Lantus to 30 units daily add Tradjenta 5 milligrams daily and continue sliding scale insulin and try to continue to teach her to take her insulin such that she can manage it as an outpatient   Malignant hypertension - controlled   ESRD on dialysis - dialysis is due tomorrow   Anemia of chronic renal failure   Secondary hyperparathyroidism   Breast mass - try to schedule mammogram ASAP, possibly as an inpatient   LOS: 14 days   Pearla Dubonnetobert Nevill Shellyann Wandrey, MD 07/24/2013, 7:38 AM

## 2013-07-24 NOTE — Progress Notes (Signed)
Admit: 07/10/2013 LOS: 14  25F CKD--> [facility]  Subjective:  NAEON   04/12 0701 - 04/13 0700 In: 340 [P.O.:120; IV Piggyback:220] Out: -   Filed Weights   07/19/13 2007 07/20/13 2053 07/21/13 1040  Weight: 84.6 kg (186 lb 8.2 oz) 84.3 kg (185 lb 13.6 oz) 81.5 kg (179 lb 10.8 oz)   Current meds: reviewed darbepoetin 60 qWk, Hectorol 4qTx, Nulecit qTx Current Labs: reviewed    Physical Exam:  Blood pressure 150/84, pulse 67, temperature 97.9 F (36.6 C), temperature source Oral, resp. rate 18, height 5\' 1"  (1.549 m), weight 81.5 kg (179 lb 10.8 oz), SpO2 99.00%. RRR NAD CTAB R BC AVF +B/T No LEE No rashes/lesion  Assessment 1. 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 DM2, hyperglycemia 3. R BC AVF placed 07/18/13 4. Anemia on Fe and ESA 5. Breast Mass  Plan 1. HD Tomorrow then [facility]  Emmakate Hypes MD 07/24/2013, 1:39 PM   Recent Labs Lab 07/17/13 1441 07/18/13 0716 07/19/13 1705 07/21/13 0655  NA 133* 134* 132* 133*  K 4.4 4.0 4.4 4.3  CL 92* 94* 93* 94*  CO2 22 22 22 23   GLUCOSE 245* 415* 282* 442*  BUN 64* 40* 48* 39*  CREATININE 7.20* 4.87* 5.79* 6.39*  CALCIUM 8.8 8.3* 8.8 8.7  PHOS 5.2*  --  4.8* 4.8*    Recent Labs Lab 07/18/13 0716 07/19/13 1705 07/21/13 0654  WBC 8.5 9.5 6.6  HGB 9.1* 9.6* 8.4*  HCT 27.6* 28.9* 25.5*  MCV 80.0 80.5 81.0  PLT 181 224 190

## 2013-07-24 NOTE — Plan of Care (Signed)
Problem: Food- and Nutrition-Related Knowledge Deficit (NB-1.1) Goal: Nutrition education Formal process to instruct or train a patient/client in a skill or to impart knowledge to help patients/clients voluntarily manage or modify food choices and eating behavior to maintain or improve health. Outcome: Completed/Met Date Met:  07/24/13  Nutrition Education Note  RD consulted for Renal Education by Diabetic Coordinator. This is the third time that this RD has met with this patient during this hospitalization.  Pt with specific questions regarding peanut butter and beans. Provided additional information to patient regarding potassium and phosphorus recommendations daily and appropriate serving sizes of foods. Explained why diet restrictions are needed and provided lists of foods to limit/avoid that are high potassium, sodium, and phosphorus. Provided specific recommendations on safer alternatives of these foods. Strongly encouraged compliance of this diet.   Discussed importance of protein intake at each meal and snack. Provided examples of how to maximize protein intake throughout the day. Discussed need for fluid restriction with dialysis, importance of minimizing weight gain between HD treatments, and renal-friendly beverage options.  Encouraged pt to discuss specific diet questions/concerns with RD at HD outpatient facility. Teach back method used.  Expect fair compliance.  Body mass index is 33.97 kg/(m^2). Pt meets criteria for Obese Class I based on current BMI.  Current diet order is Renal with CHO Modified Restrictions, patient is consuming approximately >50% of meals at this time. Labs and medications reviewed. No further nutrition interventions warranted at this time. RD contact information provided. If additional nutrition issues arise, please re-consult RD.  Inda Coke MS, RD, LDN Inpatient Registered Dietitian Pager: [phone] After-hours pager: [phone]

## 2013-07-25 LAB — RENAL FUNCTION PANEL
Albumin: 2.5 g/dL — ABNORMAL LOW (ref 3.5–5.2)
BUN: 54 mg/dL — ABNORMAL HIGH (ref 6–23)
CALCIUM: 8.8 mg/dL (ref 8.4–10.5)
CO2: 24 meq/L (ref 19–32)
Chloride: 92 mEq/L — ABNORMAL LOW (ref 96–112)
Creatinine, Ser: 8.06 mg/dL — ABNORMAL HIGH (ref 0.50–1.10)
GFR, EST AFRICAN AMERICAN: 6 mL/min — AB (ref >90)
GFR, EST NON AFRICAN AMERICAN: 5 mL/min — AB (ref >90)
GLUCOSE: 169 mg/dL — AB (ref 70–99)
POTASSIUM: 4.2 meq/L (ref 3.7–5.3)
Phosphorus: 5.6 mg/dL — ABNORMAL HIGH (ref 2.3–4.6)
SODIUM: 133 meq/L — AB (ref 137–147)

## 2013-07-25 LAB — CBC
HCT: 27.2 % — ABNORMAL LOW (ref 36.0–46.0)
HEMOGLOBIN: 8.8 g/dL — AB (ref 12.0–15.0)
MCH: 26.6 pg (ref 26.0–34.0)
MCHC: 32.4 g/dL (ref 30.0–36.0)
MCV: 82.2 fL (ref 78.0–100.0)
PLATELETS: 231 10*3/uL (ref 150–400)
RBC: 3.31 MIL/uL — AB (ref 3.87–5.11)
RDW: 14 % (ref 11.5–15.5)
WBC: 7.9 10*3/uL (ref 4.0–10.5)

## 2013-07-25 LAB — GLUCOSE, CAPILLARY
GLUCOSE-CAPILLARY: 235 mg/dL — AB (ref 70–99)
GLUCOSE-CAPILLARY: 150 mg/dL — AB (ref 70–99)
GLUCOSE-CAPILLARY: 100 mg/dL — AB (ref 70–99)
GLUCOSE-CAPILLARY: 256 mg/dL — AB (ref 70–99)
Glucose-Capillary: 304 mg/dL — ABNORMAL HIGH (ref 70–99)

## 2013-07-25 MED ORDER — DOXERCALCIFEROL 4 MCG/2ML IV SOLN
INTRAVENOUS | Status: AC
  Filled 2013-07-25: qty 2

## 2013-07-25 NOTE — Procedures (Signed)
I was present at this dialysis session. I have reviewed the session itself and made appropriate changes.   Pt w/o complaint.  CBG <200 this AM.  [facility]  Pt has chair on 2nd shift.  Sabra Heckyan Sanford  MD 07/25/2013, 9:19 AM

## 2013-07-25 NOTE — Progress Notes (Signed)
Subjective: The patient was at dialysis this morning and appeared comfortable. No abdominal cramping. Receiving excellent instruction in diabetic testing and treatment by the diabetic nurse. Hopeful for discharge tomorrow morning  Objective: Weight change:   Intake/Output Summary (Last 24 hours) at 07/25/13 1828 Last data filed at 07/25/13 1100  Gross per 24 hour  Intake    600 ml  Output   1900 ml  Net  -1300 ml   Filed Vitals:   07/25/13 1000 07/25/13 1030 07/25/13 1051 07/25/13 1100  BP: 160/80 171/86 159/84 150/67  Pulse: 69 71 72 72  Temp:   98.4 F (36.9 C) 98.7 F (37.1 C)  TempSrc:   Oral Oral  Resp:   18 19  Height:      Weight:   185 lb 3 oz (84 kg)   SpO2:   98% 100%    General Appearance: Alert, cooperative, no distress, appears stated age Lungs: Clear to auscultation bilaterally, respirations unlabored  Lab Results: Results for orders placed during the hospital encounter of 07/10/13 (from the past 48 hour(s))  GLUCOSE, CAPILLARY     Status: Abnormal   Collection Time    07/23/13  8:59 PM      Result Value Ref Range   Glucose-Capillary 410 (*) 70 - 99 mg/dL  GLUCOSE, CAPILLARY     Status: Abnormal   Collection Time    07/24/13 12:50 AM      Result Value Ref Range   Glucose-Capillary 385 (*) 70 - 99 mg/dL  GLUCOSE, CAPILLARY     Status: Abnormal   Collection Time    07/24/13  4:15 AM      Result Value Ref Range   Glucose-Capillary 275 (*) 70 - 99 mg/dL  GLUCOSE, CAPILLARY     Status: Abnormal   Collection Time    07/24/13  7:36 AM      Result Value Ref Range   Glucose-Capillary 311 (*) 70 - 99 mg/dL  GLUCOSE, CAPILLARY     Status: Abnormal   Collection Time    07/24/13 11:30 AM      Result Value Ref Range   Glucose-Capillary 246 (*) 70 - 99 mg/dL  GLUCOSE, CAPILLARY     Status: Abnormal   Collection Time    07/24/13  4:17 PM      Result Value Ref Range   Glucose-Capillary 279 (*) 70 - 99 mg/dL  GLUCOSE, CAPILLARY     Status: Abnormal   Collection Time    07/24/13  8:20 PM      Result Value Ref Range   Glucose-Capillary 258 (*) 70 - 99 mg/dL  GLUCOSE, CAPILLARY     Status: Abnormal   Collection Time    07/25/13  1:23 AM      Result Value Ref Range   Glucose-Capillary 235 (*) 70 - 99 mg/dL  GLUCOSE, CAPILLARY     Status: Abnormal   Collection Time    07/25/13  5:49 AM      Result Value Ref Range   Glucose-Capillary 150 (*) 70 - 99 mg/dL  CBC     Status: Abnormal   Collection Time    07/25/13  6:16 AM      Result Value Ref Range   WBC 7.9  4.0 - 10.5 K/uL   RBC 3.31 (*) 3.87 - 5.11 MIL/uL   Hemoglobin 8.8 (*) 12.0 - 15.0 g/dL   HCT 27.2 (*) 36.0 - 46.0 %   MCV 82.2  78.0 - 100.0 fL   MCH  26.6  26.0 - 34.0 pg   MCHC 32.4  30.0 - 36.0 g/dL   RDW 14.0  11.5 - 15.5 %   Platelets 231  150 - 400 K/uL  RENAL FUNCTION PANEL     Status: Abnormal   Collection Time    07/25/13  6:16 AM      Result Value Ref Range   Sodium 133 (*) 137 - 147 mEq/L   Potassium 4.2  3.7 - 5.3 mEq/L   Chloride 92 (*) 96 - 112 mEq/L   CO2 24  19 - 32 mEq/L   Glucose, Bld 169 (*) 70 - 99 mg/dL   BUN 54 (*) 6 - 23 mg/dL   Creatinine, Ser 8.06 (*) 0.50 - 1.10 mg/dL   Calcium 8.8  8.4 - 10.5 mg/dL   Phosphorus 5.6 (*) 2.3 - 4.6 mg/dL   Albumin 2.5 (*) 3.5 - 5.2 g/dL   GFR calc non Af Amer 5 (*) >90 mL/min   GFR calc Af Amer 6 (*) >90 mL/min   Comment: (NOTE)     The eGFR has been calculated using the CKD EPI equation.     This calculation has not been validated in all clinical situations.     eGFR's persistently <90 mL/min signify possible Chronic Kidney     Disease.  GLUCOSE, CAPILLARY     Status: Abnormal   Collection Time    07/25/13 11:27 AM      Result Value Ref Range   Glucose-Capillary 100 (*) 70 - 99 mg/dL  GLUCOSE, CAPILLARY     Status: Abnormal   Collection Time    07/25/13  4:11 PM      Result Value Ref Range   Glucose-Capillary 304 (*) 70 - 99 mg/dL    Studies/Results: No results found. Medications: Scheduled  Meds: . amLODipine  5 mg Oral QHS  . atenolol  25 mg Oral QHS  . darbepoetin (ARANESP) injection - NON-DIALYSIS  60 mcg Subcutaneous Q Sat-1800  . doxercalciferol  4 mcg Intravenous Q T,Th,Sa-HD  . ferric gluconate (FERRLECIT/NULECIT) IV  125 mg Intravenous Q T,Th,Sa-HD  . insulin aspart  0-9 Units Subcutaneous TID AC & HS  . insulin glargine  30 Units Subcutaneous QHS  . linagliptin  5 mg Oral Daily  . multivitamin  1 tablet Oral QHS  . pantoprazole  40 mg Oral Daily  . polyethylene glycol  17 g Oral Daily   Continuous Infusions: . sodium chloride 20 mL/hr at 07/18/13 1228   PRN Meds:.acetaminophen, acetaminophen, albuterol, bisacodyl, dextrose, HYDROcodone-acetaminophen, HYDROmorphone (DILAUDID) injection, LORazepam, ondansetron (ZOFRAN) IV, ondansetron (ZOFRAN) IV, ondansetron, oxyCODONE-acetaminophen, promethazine, zolpidem  Assessment/Plan:  Active Problems:  Type II or unspecified type diabetes mellitus with renal manifestations, uncontrolled - increase Lantus to 30 units daily add Tradjenta 5 milligrams daily and continue sliding scale insulin and try to continue to teach her to take her insulin such that she can manage it as an outpatient  Malignant hypertension - controlled  ESRD on dialysis - dialysis is due tomorrow  Anemia of chronic renal failure  Secondary hyperparathyroidism  Breast mass - try to schedule mammogram ASAP, possibly as an inpatient    LOS: 15 days   Henrine Screws, MD 07/25/2013, 6:28 PM

## 2013-07-26 LAB — GLUCOSE, CAPILLARY
Glucose-Capillary: 244 mg/dL — ABNORMAL HIGH (ref 70–99)
Glucose-Capillary: 272 mg/dL — ABNORMAL HIGH (ref 70–99)
Glucose-Capillary: 369 mg/dL — ABNORMAL HIGH (ref 70–99)

## 2013-07-26 MED ORDER — PROMETHAZINE HCL 25 MG PO TABS: 25.0000 mg | ORAL_TABLET | Freq: Four times a day (QID) | ORAL | Status: AC | PRN

## 2013-07-26 MED ORDER — ATENOLOL 25 MG PO TABS: 25.0000 mg | ORAL_TABLET | Freq: Every day | ORAL | Status: DC

## 2013-07-26 MED ORDER — POLYETHYLENE GLYCOL 3350 17 G PO PACK: 17.0000 g | PACK | Freq: Every day | ORAL | Status: DC

## 2013-07-26 MED ORDER — INSULIN ASPART 100 UNIT/ML ~~LOC~~ SOLN: 0.0000 [IU] | Freq: Three times a day (TID) | SUBCUTANEOUS | Status: DC

## 2013-07-26 MED ORDER — AMLODIPINE BESYLATE 5 MG PO TABS: 5.0000 mg | ORAL_TABLET | Freq: Every day | ORAL | Status: DC

## 2013-07-26 MED ORDER — DARBEPOETIN ALFA-POLYSORBATE 60 MCG/0.3ML IJ SOLN: 60.0000 ug | INTRAMUSCULAR | Status: DC

## 2013-07-26 MED ORDER — SODIUM CHLORIDE 0.9 % IV SOLN: 125.0000 mg | INTRAVENOUS | Status: AC

## 2013-07-26 MED ORDER — PANTOPRAZOLE SODIUM 40 MG PO TBEC: 40.0000 mg | DELAYED_RELEASE_TABLET | Freq: Every day | ORAL | Status: AC

## 2013-07-26 MED ORDER — LINAGLIPTIN 5 MG PO TABS: 5.0000 mg | ORAL_TABLET | Freq: Every day | ORAL | Status: AC

## 2013-07-26 MED ORDER — INSULIN GLARGINE 100 UNIT/ML ~~LOC~~ SOLN: 40.0000 [IU] | Freq: Every day | SUBCUTANEOUS | Status: DC

## 2013-07-26 MED ORDER — RENA-VITE PO TABS: 1.0000 | ORAL_TABLET | Freq: Every day | ORAL | Status: DC

## 2013-07-26 MED ORDER — INSULIN GLARGINE 100 UNIT/ML ~~LOC~~ SOLN
40.0000 [IU] | Freq: Every day | SUBCUTANEOUS | Status: DC
  Filled 2013-07-26: qty 0.4

## 2013-07-26 MED ORDER — DOXERCALCIFEROL 4 MCG/2ML IV SOLN: 4.0000 ug | INTRAVENOUS | Status: AC

## 2013-07-26 NOTE — Discharge Summary (Signed)
Physician Discharge Summary  NAME:[name]  ZOX:[id number]  DOB: 03/03/1961   Admit date: 07/10/2013 Discharge date: 07/26/2013  Discharge Diagnoses:  Active Problems:   Type II or unspecified type diabetes mellitus with renal manifestations, uncontrolled, insulin requiring   Malignant hypertension   ESRD on dialysis   Noncompliance secondary to inability to take medications secondary to not having been insured with access to healthcare                              medications   Dyspnea   Anemia of chronic renal failure   Secondary hyperparathyroidism   Breast mass - left - newly diagnosed and needs followup mammogram and workup as outpatient   Creation of right arm AV fistula   Placement of right subcutaneously tunneled IJ catheter   Abdominal cramping   Discharge Physical Exam:  General Appearance: Alert, cooperative, no distress, appears stated age  Weight change: -4 lb 10.1 oz (-2.1 kg)  Intake/Output Summary (Last 24 hours) at 07/26/13 0705 Last data filed at 07/25/13 1700  Gross per 24 hour  Intake    480 ml  Output   1900 ml  Net  -1420 ml   Filed Vitals:   07/25/13 1100 07/25/13 1700 07/25/13 2152 07/26/13 0458  BP: 150/67 129/71 146/61 153/56  Pulse: 72 75 77 73  Temp: 98.7 F (37.1 C) 98.9 F (37.2 C) 100 F (37.8 C) 99.2 F (37.3 C)  TempSrc: Oral Oral Oral Oral  Resp: 19 18 18 16   Height:      Weight:      SpO2: 100% 100% 99% 100%    General Appearance: Alert, cooperative, no distress, appears stated age  Lungs: Clear to auscultation bilaterally, respirations unlabored  Breast: Left upper outer quadrant breast mass is present. Appears to be in a tear drop shape with the larger end more lateral and inferior in the upper and more medial end superior. No axillary adenopathy noted or supraclavicular adenopathy noted  Heart: Regular rate and rhythm, S1 and S2 normal, no murmur, rub or gallop  Abdomen: Obese, soft, non-tender, bowel sounds active all  four quadrants, no masses, no organomegaly  Extremities: Extremities normal, atraumatic, no cyanosis or edema.  Right arm AV fistula with thrill at home.  No erythema or drainage  Neuro: Oriented x3, nonfocal  Discharge Condition: Much improved  Hospital Course: [name] is a pleasant 53 year old female with history of hypertension, diabetes mellitus, and chronic kidney disease, presented to the ED with shortness of breath and severe hypertension. History was obtained from the patient who stated that she started feeling short of breath 3 days prior to admission and she thought she had seasonal allergies. However the day prior to admission she could not breathe and had significant nocturnal orthopnea and has to use 4 pillows, sitting upright to improve shortness of breath. Patient had no coughing, fevers or chills. She stated that she has been drinking a lot and urinating a lot as well.  Per patient she was told by her PCP a year ago that she needed to see a nephrologist however she did not follow up. She was taken off of metformin secondary to her chronic kidney disease, was recommended to use Lantus insulin one year ago. Patient however reports that she was not able to afford it and hence she has not used insulin. She has been on glyburide.  ER workup showed BUN of 65, creatinine 5.3,  bicarbonate 18, glucose 481. Anion gap was 19, BNP 15,005. UA negative for ketones, more than 300 protein with glucosuria.  Blood pressures were extremely elevated on admission and patient was intermittently confused and delirious.  She was admitted to step down and treated with beta blockers and hydralazine for blood pressure control and nephrology was consulted. Nephrology recommended creation of AV fistula because of persistently rising creatinines even with treatment of hypertension and with diuresis.  Ultimately, she required dialysis and is now scheduled for 3 times a week dialysis.  She has a right IJ  catheter for dialysis temporarily until her newly created AV fistula in her right arm can be used. Other issues while hospitalized included possible pneumonia which was treated with Rocephin and azithromycin which resolved on chest x-ray rather quickly, within 3-5 days. Acidosis on admission improved greatly and probably never was in DKA. Diabetes is continuing to improve in terms of control.  She will be discharged on 40 of Lantus daily with sliding scale NovoLog insulin to be given before meals and at bedtime. Dialysis has been arranged for Tuesday Thursdays and Saturdays. Started medications for her diabetes will be provided by my office, at [facility].   Things to follow up in the outpatient setting: Blood pressure, blood sugar, and compliance with medications and office visits  Consults: Treatment Team:  Sadie Haber, MD  Disposition: 01-Home or Self Care  Discharge Orders   Future Appointments Provider Department Dept Phone   08/31/2013 12:30 PM [facility] [place] [facility] [address] [phone]   08/31/2013 1:30 PM Sherren Kerns, MD [facility] [phone]   07/18/2014 11:30 AM [facility] [place] [facility] [address] [phone]   07/18/2014 12:30 PM Chuck Hint, MD [facility] [phone]   Future Orders Complete By Expires   Diet - low sodium heart healthy  As directed    Discharge instructions  As directed    Increase activity slowly  As directed        Medication List    STOP taking these medications       acetaminophen 325 MG tablet  Commonly known as:  TYLENOL     felodipine 5 MG 24 hr tablet  Commonly known as:  PLENDIL     ferrous gluconate 325 MG tablet  Commonly known as:  FERGON     glyBURIDE 5 MG tablet  Commonly known as:  DIABETA      TAKE these medications       albuterol (2.5 MG/3ML) 0.083% nebulizer solution  Commonly  known as:  PROVENTIL  Take 2.5 mg by nebulization every 6 (six) hours as needed for wheezing or shortness of breath.     amLODipine 5 MG tablet  Commonly known as:  NORVASC  Take 1 tablet (5 mg total) by mouth at bedtime.     atenolol 25 MG tablet  Commonly known as:  TENORMIN  Take 1 tablet (25 mg total) by mouth at bedtime.     darbepoetin 60 MCG/0.3ML Soln injection  Commonly known as:  ARANESP  Inject 0.3 mLs (60 mcg total) into the skin every Saturday at 6 PM.     doxercalciferol 4 MCG/2ML injection  Commonly known as:  HECTOROL  Inject 2 mLs (4 mcg total) into the vein Every Tuesday,Thursday,and Saturday with dialysis.     ferric gluconate 125 mg in sodium chloride 0.9 % 100 mL  Inject 125 mg into the  vein Every Tuesday,Thursday,and Saturday with dialysis.     insulin aspart 100 UNIT/ML injection  Commonly known as:  novoLOG  Inject 0-9 Units into the skin 4 (four) times daily -  before meals and at bedtime.     insulin glargine 100 UNIT/ML injection  Commonly known as:  LANTUS  Inject 0.4 mLs (40 Units total) into the skin at bedtime.     linagliptin 5 MG Tabs tablet  Commonly known as:  TRADJENTA  Take 1 tablet (5 mg total) by mouth daily.     multivitamin Tabs tablet  Take 1 tablet by mouth at bedtime.     pantoprazole 40 MG tablet  Commonly known as:  PROTONIX  Take 1 tablet (40 mg total) by mouth daily.     polyethylene glycol packet  Commonly known as:  MIRALAX / GLYCOLAX  Take 17 g by mouth daily.           Follow-up Information   Follow up with Sherren Kerns, MD In 6 weeks. (Office will call you to arrange your appt (sent))    Specialty:  Vascular Surgery   Contact information:   [address] [place] [address] [phone]       The results of significant diagnostics from this hospitalization (including imaging, microbiology, ancillary and laboratory) are listed below for reference.    Significant Diagnostic Studies: Dg Chest 2  View  07/14/2013   CLINICAL DATA:  Hypertension, diabetes, left base atelectasis  EXAM: CHEST  2 VIEW  COMPARISON:  07/12/2013  FINDINGS: Normal heart size and vascularity. No focal pneumonia, collapse or consolidation. Resolved left base atelectasis. Trace left effusion noted, appreciated on the lateral view. No pneumothorax. Trachea is midline.  IMPRESSION: Trace left pleural effusion.  No other acute finding.   Electronically Signed   By: Ruel Favors M.D.   On: 07/14/2013 09:41   Dg Chest 2 View  07/10/2013   CLINICAL DATA:  Orthopnea  EXAM: CHEST  2 VIEW  COMPARISON:  None.  FINDINGS: The lungs are adequately inflated. There is hazy increased density projecting over the lower thoracic spine which is likely on the left on the frontal film. This may reflect atelectasis or pneumonia. The cardiac silhouette is normal in size. The pulmonary vascularity is mildly prominent centrally. The mediastinum is normal in width. There is a trace of blunting of the costophrenic angles. The bony structures exhibit no acute abnormalities.  IMPRESSION: 1. Increased density projecting over the lower thoracic spine on the lateral film likely corresponds to density in the retrocardiac region on the frontal film. This is consistent with pneumonia. 2. One cannot exclude low-grade compensated CHF.   Electronically Signed   By: David  [place]   On: 07/10/2013 14:17   Dg Abd 1 View  07/15/2013   CLINICAL DATA:  Abdominal pain and nausea  EXAM: ABDOMEN - 1 VIEW  COMPARISON:  None.  FINDINGS: There is stool and air in the colon; there is no appreciable colonic dilatation. Essentially all small bowel loops are fluid-filled. No free air is seen on this supine examination. There are multiple phleboliths in the pelvis.  IMPRESSION: Small bowel is fluid-filled. This finding may be seen normally. It also, however, may be seen with early ileus or enteritis. Obstruction is not felt to be likely. No free air is seen on this supine examination.    Electronically Signed   By: Bretta Bang M.D.   On: 07/15/2013 21:35   US Renal  07/10/2013   CLINICAL  DATA:  Renal failure. Elevated creatinine and BUN. Hypertension and diabetes.  EXAM: RENAL/URINARY TRACT ULTRASOUND COMPLETE  COMPARISON:  09/06/2011  FINDINGS: Right Kidney:  Length: 8.5 cm. Slight volume loss with lobular cortex showing slightly increased echogenicity. No mass, cyst, stone or hydronephrosis.  Left Kidney:  Length: 8.8 cm. Slight volume loss with lobular cortex, slightly increased echogenicity. No mass, cyst, stone or hydronephrosis.  Bladder:  Normal with bilateral ureteral jets.  IMPRESSION: Kidneys are smaller than were seen in 2013. Slight cortical volume loss. No evidence of obstruction or acute pathology.   Electronically Signed   By: Paulina Fusi M.D.   On: 07/10/2013 18:45   Dg Chest Port 1 View  07/16/2013   CLINICAL DATA:  Post dialysis catheter placement  EXAM: PORTABLE CHEST - 1 VIEW  COMPARISON:  07/14/2013  FINDINGS: Right-sided dialysis catheter is now noted. No underlying pneumothorax is seen. Inspiratory effort is poor with crowding of the vascular markings. The cardiac shadow is stable. No bony abnormality is noted.  IMPRESSION: Status post dialysis catheter placement in satisfactory position. No acute abnormality is noted.   Electronically Signed   By: Alcide Clever M.D.   On: 07/16/2013 08:49   Dg Chest Port 1 View  07/12/2013   CLINICAL DATA:  Difficulty breathing  EXAM: PORTABLE CHEST - 1 VIEW  COMPARISON:  July 10, 2013  FINDINGS: There is underlying emphysematous change. There is mild atelectatic change in the left base. Elsewhere lungs are clear. Heart size and pulmonary vascularity are normal. No adenopathy. No pneumothorax. No bone lesions.  IMPRESSION: Mild atelectatic change left base. Underlying emphysematous change. No edema or consolidation.   Electronically Signed   By: Bretta Bang M.D.   On: 07/12/2013 07:55   Dg Fluoro Guide Cv Line-no  Report  07/16/2013   CLINICAL DATA: dialysis cath   FLOURO GUIDE CV LINE  Fluoroscopy was utilized by the requesting physician.  No radiographic  interpretation.     Microbiology: No results found for this or any previous visit (from the past 240 hour(s)).   Labs: Results for orders placed during the hospital encounter of 07/10/13  URINE CULTURE      Result Value Ref Range   Specimen Description URINE, RANDOM     Special Requests NONE     Culture  Setup Time       Value: 07/11/2013 19:12     Performed at [facility] Count       Value: NO GROWTH     Performed at [facility]   Culture       Value: NO GROWTH     Performed at [facility]   Report Status 07/12/2013 FINAL    MRSA PCR SCREENING      Result Value Ref Range   MRSA by PCR NEGATIVE  NEGATIVE  SURGICAL PCR SCREEN      Result Value Ref Range   MRSA, PCR NEGATIVE  NEGATIVE   Staphylococcus aureus NEGATIVE  NEGATIVE  CBC WITH DIFFERENTIAL      Result Value Ref Range   WBC 5.5  4.0 - 10.5 K/uL   RBC 4.22  3.87 - 5.11 MIL/uL   Hemoglobin 11.2 (*) 12.0 - 15.0 g/dL   HCT 16.1 (*) 09.6 - 04.5 %   MCV 77.7 (*) 78.0 - 100.0 fL   MCH 26.5  26.0 - 34.0 pg   MCHC 34.1  30.0 - 36.0 g/dL   RDW 40.9  81.1 - 91.4 %  Platelets 186  150 - 400 K/uL   Neutrophils Relative % 74  43 - 77 %   Neutro Abs 4.1  1.7 - 7.7 K/uL   Lymphocytes Relative 18  12 - 46 %   Lymphs Abs 1.0  0.7 - 4.0 K/uL   Monocytes Relative 7  3 - 12 %   Monocytes Absolute 0.4  0.1 - 1.0 K/uL   Eosinophils Relative 1  0 - 5 %   Eosinophils Absolute 0.1  0.0 - 0.7 K/uL   Basophils Relative 0  0 - 1 %   Basophils Absolute 0.0  0.0 - 0.1 K/uL  COMPREHENSIVE METABOLIC PANEL      Result Value Ref Range   Sodium 131 (*) 137 - 147 mEq/L   Potassium 5.0  3.7 - 5.3 mEq/L   Chloride 94 (*) 96 - 112 mEq/L   CO2 18 (*) 19 - 32 mEq/L   Glucose, Bld 481 (*) 70 - 99 mg/dL   BUN 65 (*) 6 - 23 mg/dL   Creatinine, Ser 4.09 (*) 0.50 -  1.10 mg/dL   Calcium 8.9  8.4 - 81.1 mg/dL   Total Protein 6.1  6.0 - 8.3 g/dL   Albumin 2.6 (*) 3.5 - 5.2 g/dL   AST 12  0 - 37 U/L   ALT 17  0 - 35 U/L   Alkaline Phosphatase 130 (*) 39 - 117 U/L   Total Bilirubin <0.2 (*) 0.3 - 1.2 mg/dL   GFR calc non Af Amer 8 (*) >90 mL/min   GFR calc Af Amer 10 (*) >90 mL/min  URINALYSIS, ROUTINE W REFLEX MICROSCOPIC      Result Value Ref Range   Color, Urine YELLOW  YELLOW   APPearance CLEAR  CLEAR   Specific Gravity, Urine 1.021  1.005 - 1.030   pH 6.5  5.0 - 8.0   Glucose, UA >1000 (*) NEGATIVE mg/dL   Hgb urine dipstick SMALL (*) NEGATIVE   Bilirubin Urine NEGATIVE  NEGATIVE   Ketones, ur NEGATIVE  NEGATIVE mg/dL   Protein, ur >914 (*) NEGATIVE mg/dL   Urobilinogen, UA 0.2  0.0 - 1.0 mg/dL   Nitrite NEGATIVE  NEGATIVE   Leukocytes, UA NEGATIVE  NEGATIVE  PRO B NATRIURETIC PEPTIDE      Result Value Ref Range   Pro B Natriuretic peptide (BNP) 15005.0 (*) 0 - 125 pg/mL  URINE MICROSCOPIC-ADD ON      Result Value Ref Range   Squamous Epithelial / LPF MANY (*) RARE   WBC, UA 7-10  <3 WBC/hpf   RBC / HPF 0-2  <3 RBC/hpf   Bacteria, UA FEW (*) RARE  IMMUNOFIXATION ELECTROPHORESIS, URINE (WITH TOT PROT)      Result Value Ref Range   Time RANDOM     Volume, Urine RANDOM     Total Protein, Urine 460.5     Total Protein, Urine-Ur/day NOT CALC  10 - 140 mg/day   Albumin, U DETECTED  DETECTED   Alpha 1, Urine DETECTED (*) NONE DETECTED   Alpha 2, Urine DETECTED (*) NONE DETECTED   Beta, Urine DETECTED (*) NONE DETECTED   Gamma Globulin, Urine DETECTED (*) NONE DETECTED   Free Kappa Lt Chains,Ur 22.30 (*) 0.14 - 2.42 mg/dL   Free Lt Chn Excr Rate NOT CALC     Free Lambda Lt Chains,Ur 4.65 (*) 0.02 - 0.67 mg/dL   Free Lambda Excretion/Day NOT CALC     Free Kappa/Lambda Ratio 4.80  2.04 - 10.37  ratio   Immunofixation, Urine (NOTE)    PROTEIN / CREATININE RATIO, URINE      Result Value Ref Range   Creatinine, Urine 40.53     Total  Protein, Urine 598.1     PROTEIN CREATININE RATIO 14.76 (*) 0.00 - 0.15  CBC      Result Value Ref Range   WBC 5.9  4.0 - 10.5 K/uL   RBC 4.09  3.87 - 5.11 MIL/uL   Hemoglobin 10.9 (*) 12.0 - 15.0 g/dL   HCT 16.131.6 (*) 09.636.0 - 04.546.0 %   MCV 77.3 (*) 78.0 - 100.0 fL   MCH 26.7  26.0 - 34.0 pg   MCHC 34.5  30.0 - 36.0 g/dL   RDW 40.913.1  81.111.5 - 91.415.5 %   Platelets 215  150 - 400 K/uL  IRON AND TIBC      Result Value Ref Range   Iron 14 (*) 42 - 135 ug/dL   TIBC 782231 (*) 956250 - 213470 ug/dL   Saturation Ratios 6 (*) 20 - 55 %   UIBC 217  125 - 400 ug/dL  FERRITIN      Result Value Ref Range   Ferritin 117  10 - 291 ng/mL  RENAL FUNCTION PANEL      Result Value Ref Range   Sodium 133 (*) 137 - 147 mEq/L   Potassium 4.5  3.7 - 5.3 mEq/L   Chloride 98  96 - 112 mEq/L   CO2 17 (*) 19 - 32 mEq/L   Glucose, Bld 115 (*) 70 - 99 mg/dL   BUN 64 (*) 6 - 23 mg/dL   Creatinine, Ser 0.865.44 (*) 0.50 - 1.10 mg/dL   Calcium 8.9  8.4 - 57.810.5 mg/dL   Phosphorus 5.0 (*) 2.3 - 4.6 mg/dL   Albumin 2.4 (*) 3.5 - 5.2 g/dL   GFR calc non Af Amer 8 (*) >90 mL/min   GFR calc Af Amer 9 (*) >90 mL/min  PROTEIN ELECTROPHORESIS, SERUM      Result Value Ref Range   Total Protein ELP 5.5 (*) 6.0 - 8.3 g/dL   Albumin ELP 46.951.0 (*) 55.8 - 66.1 %   Alpha-1-Globulin 7.5 (*) 2.9 - 4.9 %   Alpha-2-Globulin 21.0 (*) 7.1 - 11.8 %   Beta Globulin 6.3  4.7 - 7.2 %   Beta 2 6.2  3.2 - 6.5 %   Gamma Globulin 8.0 (*) 11.1 - 18.8 %   M-Spike, % NOT DETECTED     SPE Interp. (NOTE)     Comment (NOTE)    VITAMIN D 25 HYDROXY      Result Value Ref Range   Vit D, 25-Hydroxy 12 (*) 30 - 89 ng/mL  BASIC METABOLIC PANEL      Result Value Ref Range   Sodium 133 (*) 137 - 147 mEq/L   Potassium 4.4  3.7 - 5.3 mEq/L   Chloride 98  96 - 112 mEq/L   CO2 17 (*) 19 - 32 mEq/L   Glucose, Bld 128 (*) 70 - 99 mg/dL   BUN 63 (*) 6 - 23 mg/dL   Creatinine, Ser 6.295.26 (*) 0.50 - 1.10 mg/dL   Calcium 9.0  8.4 - 52.810.5 mg/dL   GFR calc non Af Amer  8 (*) >90 mL/min   GFR calc Af Amer 10 (*) >90 mL/min  BASIC METABOLIC PANEL      Result Value Ref Range   Sodium 134 (*) 137 - 147 mEq/L   Potassium 4.5  3.7 - 5.3 mEq/L   Chloride 97  96 - 112 mEq/L   CO2 17 (*) 19 - 32 mEq/L   Glucose, Bld 141 (*) 70 - 99 mg/dL   BUN 61 (*) 6 - 23 mg/dL   Creatinine, Ser 1.61 (*) 0.50 - 1.10 mg/dL   Calcium 8.6  8.4 - 09.6 mg/dL   GFR calc non Af Amer 8 (*) >90 mL/min   GFR calc Af Amer 10 (*) >90 mL/min  STREP PNEUMONIAE URINARY ANTIGEN      Result Value Ref Range   Strep Pneumo Urinary Antigen NEGATIVE  NEGATIVE  LEGIONELLA ANTIGEN, URINE      Result Value Ref Range   Specimen Description URINE, RANDOM     Special Requests NONE     Legionella Antigen, Urine       Value: Negative for Legionella pneumophilia serogroup 1     Performed at [facility]   Report Status 07/12/2013 FINAL    BASIC METABOLIC PANEL      Result Value Ref Range   Sodium 132 (*) 137 - 147 mEq/L   Potassium 4.4  3.7 - 5.3 mEq/L   Chloride 97  96 - 112 mEq/L   CO2 16 (*) 19 - 32 mEq/L   Glucose, Bld 112 (*) 70 - 99 mg/dL   BUN 64 (*) 6 - 23 mg/dL   Creatinine, Ser 0.45 (*) 0.50 - 1.10 mg/dL   Calcium 8.8  8.4 - 40.9 mg/dL   GFR calc non Af Amer 8 (*) >90 mL/min   GFR calc Af Amer 10 (*) >90 mL/min  MICROALBUMIN / CREATININE URINE RATIO      Result Value Ref Range   Microalb, Ur 435.75 (*) 0.00 - 1.89 mg/dL   Creatinine, Urine 81.1     Microalb Creat Ratio 5193.7 (*) 0.0 - 30.0 mg/g  PROTEIN, URINE, 24 HOUR      Result Value Ref Range   Urine Total Volume-UPROT 750     Collection Interval-UPROT 750     Protein, Urine 598     Protein, 24H Urine 144 (*) 50 - 100 mg/day  NA AND K (SODIUM & POTASSIUM), RAND UR      Result Value Ref Range   Sodium, Ur 41     Potassium Urine Timed 39    GLUCOSE, CAPILLARY      Result Value Ref Range   Glucose-Capillary 163 (*) 70 - 99 mg/dL   Comment 1 Notify RN     Comment 2 Documented in Chart    GLUCOSE, CAPILLARY       Result Value Ref Range   Glucose-Capillary 206 (*) 70 - 99 mg/dL  HEMOGLOBIN B1Y      Result Value Ref Range   Hemoglobin A1C 14.5 (*) <5.7 %   Mean Plasma Glucose 369 (*) <117 mg/dL  VITAMIN N82      Result Value Ref Range   Vitamin B-12 925 (*) 211 - 911 pg/mL  FOLATE      Result Value Ref Range   Folate 11.8    RETICULOCYTES      Result Value Ref Range   Retic Ct Pct 1.3  0.4 - 3.1 %   RBC. 4.09  3.87 - 5.11 MIL/uL   Retic Count, Manual 53.2  19.0 - 186.0 K/uL  TSH      Result Value Ref Range   TSH 2.860  0.350 - 4.500 uIU/mL  GLUCOSE, CAPILLARY      Result Value Ref Range  Glucose-Capillary 117 (*) 70 - 99 mg/dL  GLUCOSE, CAPILLARY      Result Value Ref Range   Glucose-Capillary 157 (*) 70 - 99 mg/dL  GLUCOSE, CAPILLARY      Result Value Ref Range   Glucose-Capillary 177 (*) 70 - 99 mg/dL  GLUCOSE, CAPILLARY      Result Value Ref Range   Glucose-Capillary 197 (*) 70 - 99 mg/dL  CBC WITH DIFFERENTIAL      Result Value Ref Range   WBC 5.8  4.0 - 10.5 K/uL   RBC 4.02  3.87 - 5.11 MIL/uL   Hemoglobin 10.8 (*) 12.0 - 15.0 g/dL   HCT 69.6 (*) 29.5 - 28.4 %   MCV 78.6  78.0 - 100.0 fL   MCH 26.9  26.0 - 34.0 pg   MCHC 34.2  30.0 - 36.0 g/dL   RDW 13.2  44.0 - 10.2 %   Platelets 228  150 - 400 K/uL   Neutrophils Relative % 59  43 - 77 %   Neutro Abs 3.4  1.7 - 7.7 K/uL   Lymphocytes Relative 31  12 - 46 %   Lymphs Abs 1.8  0.7 - 4.0 K/uL   Monocytes Relative 8  3 - 12 %   Monocytes Absolute 0.5  0.1 - 1.0 K/uL   Eosinophils Relative 2  0 - 5 %   Eosinophils Absolute 0.1  0.0 - 0.7 K/uL   Basophils Relative 0  0 - 1 %   Basophils Absolute 0.0  0.0 - 0.1 K/uL  HEMOGLOBIN A1C      Result Value Ref Range   Hemoglobin A1C 14.2 (*) <5.7 %   Mean Plasma Glucose 361 (*) <117 mg/dL  GLUCOSE, CAPILLARY      Result Value Ref Range   Glucose-Capillary 79  70 - 99 mg/dL   Comment 1 Notify RN     Comment 2 Documented in Chart    GLUCOSE, CAPILLARY      Result Value  Ref Range   Glucose-Capillary 182 (*) 70 - 99 mg/dL   Comment 1 Notify RN     Comment 2 Documented in Chart    GLUCOSE, CAPILLARY      Result Value Ref Range   Glucose-Capillary 98  70 - 99 mg/dL   Comment 1 Notify RN     Comment 2 Documented in Chart    GLUCOSE, CAPILLARY      Result Value Ref Range   Glucose-Capillary 161 (*) 70 - 99 mg/dL   Comment 1 Notify RN     Comment 2 Documented in Chart    LIPID PANEL      Result Value Ref Range   Cholesterol 252 (*) 0 - 200 mg/dL   Triglycerides 725 (*) <150 mg/dL   HDL 38 (*) >36 mg/dL   Total CHOL/HDL Ratio 6.6     VLDL 54 (*) 0 - 40 mg/dL   LDL Cholesterol 644 (*) 0 - 99 mg/dL  RENAL FUNCTION PANEL      Result Value Ref Range   Sodium 132 (*) 137 - 147 mEq/L   Potassium 4.4  3.7 - 5.3 mEq/L   Chloride 96  96 - 112 mEq/L   CO2 17 (*) 19 - 32 mEq/L   Glucose, Bld 83  70 - 99 mg/dL   BUN 65 (*) 6 - 23 mg/dL   Creatinine, Ser 0.34 (*) 0.50 - 1.10 mg/dL   Calcium 9.1  8.4 - 74.2 mg/dL   Phosphorus 6.0 (*)  2.3 - 4.6 mg/dL   Albumin 2.5 (*) 3.5 - 5.2 g/dL   GFR calc non Af Amer 8 (*) >90 mL/min   GFR calc Af Amer 9 (*) >90 mL/min  GLUCOSE, CAPILLARY      Result Value Ref Range   Glucose-Capillary 115 (*) 70 - 99 mg/dL   Comment 1 Notify RN     Comment 2 Documented in Chart    GLUCOSE, CAPILLARY      Result Value Ref Range   Glucose-Capillary 77  70 - 99 mg/dL  GLUCOSE, CAPILLARY      Result Value Ref Range   Glucose-Capillary 111 (*) 70 - 99 mg/dL   Comment 1 Documented in Chart     Comment 2 Notify RN    GLUCOSE, CAPILLARY      Result Value Ref Range   Glucose-Capillary 101 (*) 70 - 99 mg/dL   Comment 1 Documented in Chart     Comment 2 Notify RN    GLUCOSE, CAPILLARY      Result Value Ref Range   Glucose-Capillary 75  70 - 99 mg/dL   Comment 1 Documented in Chart     Comment 2 Notify RN    RENAL FUNCTION PANEL      Result Value Ref Range   Sodium 132 (*) 137 - 147 mEq/L   Potassium 4.0  3.7 - 5.3 mEq/L    Chloride 96  96 - 112 mEq/L   CO2 18 (*) 19 - 32 mEq/L   Glucose, Bld 107 (*) 70 - 99 mg/dL   BUN 65 (*) 6 - 23 mg/dL   Creatinine, Ser 1.61 (*) 0.50 - 1.10 mg/dL   Calcium 8.5  8.4 - 09.6 mg/dL   Phosphorus 6.0 (*) 2.3 - 4.6 mg/dL   Albumin 2.3 (*) 3.5 - 5.2 g/dL   GFR calc non Af Amer 7 (*) >90 mL/min   GFR calc Af Amer 8 (*) >90 mL/min  GLUCOSE, CAPILLARY      Result Value Ref Range   Glucose-Capillary 191 (*) 70 - 99 mg/dL  GLUCOSE, CAPILLARY      Result Value Ref Range   Glucose-Capillary 151 (*) 70 - 99 mg/dL   Comment 1 Notify RN     Comment 2 Documented in Chart    GLUCOSE, CAPILLARY      Result Value Ref Range   Glucose-Capillary 86  70 - 99 mg/dL   Comment 1 Notify RN     Comment 2 Documented in Chart    GLUCOSE, CAPILLARY      Result Value Ref Range   Glucose-Capillary 58 (*) 70 - 99 mg/dL   Comment 1 Notify RN     Comment 2 Documented in Chart    GLUCOSE, CAPILLARY      Result Value Ref Range   Glucose-Capillary 95  70 - 99 mg/dL   Comment 1 Notify RN     Comment 2 Documented in Chart    GLUCOSE, CAPILLARY      Result Value Ref Range   Glucose-Capillary 144 (*) 70 - 99 mg/dL  GLUCOSE, CAPILLARY      Result Value Ref Range   Glucose-Capillary 158 (*) 70 - 99 mg/dL  GLUCOSE, CAPILLARY      Result Value Ref Range   Glucose-Capillary 202 (*) 70 - 99 mg/dL  RENAL FUNCTION PANEL      Result Value Ref Range   Sodium 134 (*) 137 - 147 mEq/L   Potassium 4.3  3.7 - 5.3  mEq/L   Chloride 96  96 - 112 mEq/L   CO2 19  19 - 32 mEq/L   Glucose, Bld 151 (*) 70 - 99 mg/dL   BUN 67 (*) 6 - 23 mg/dL   Creatinine, Ser 1.61 (*) 0.50 - 1.10 mg/dL   Calcium 8.4  8.4 - 09.6 mg/dL   Phosphorus 6.5 (*) 2.3 - 4.6 mg/dL   Albumin 2.6 (*) 3.5 - 5.2 g/dL   GFR calc non Af Amer 6 (*) >90 mL/min   GFR calc Af Amer 7 (*) >90 mL/min  GLUCOSE, CAPILLARY      Result Value Ref Range   Glucose-Capillary 214 (*) 70 - 99 mg/dL  GLUCOSE, CAPILLARY      Result Value Ref Range    Glucose-Capillary 137 (*) 70 - 99 mg/dL  GLUCOSE, CAPILLARY      Result Value Ref Range   Glucose-Capillary 116 (*) 70 - 99 mg/dL  GLUCOSE, CAPILLARY      Result Value Ref Range   Glucose-Capillary 173 (*) 70 - 99 mg/dL  GLUCOSE, CAPILLARY      Result Value Ref Range   Glucose-Capillary 251 (*) 70 - 99 mg/dL  GLUCOSE, CAPILLARY      Result Value Ref Range   Glucose-Capillary 143 (*) 70 - 99 mg/dL  CBC WITH DIFFERENTIAL      Result Value Ref Range   WBC 4.2  4.0 - 10.5 K/uL   RBC 3.57 (*) 3.87 - 5.11 MIL/uL   Hemoglobin 9.5 (*) 12.0 - 15.0 g/dL   HCT 04.5 (*) 40.9 - 81.1 %   MCV 79.3  78.0 - 100.0 fL   MCH 26.6  26.0 - 34.0 pg   MCHC 33.6  30.0 - 36.0 g/dL   RDW 91.4  78.2 - 95.6 %   Platelets 183  150 - 400 K/uL   Neutrophils Relative % 61  43 - 77 %   Neutro Abs 2.6  1.7 - 7.7 K/uL   Lymphocytes Relative 28  12 - 46 %   Lymphs Abs 1.2  0.7 - 4.0 K/uL   Monocytes Relative 9  3 - 12 %   Monocytes Absolute 0.4  0.1 - 1.0 K/uL   Eosinophils Relative 2  0 - 5 %   Eosinophils Absolute 0.1  0.0 - 0.7 K/uL   Basophils Relative 0  0 - 1 %   Basophils Absolute 0.0  0.0 - 0.1 K/uL  BASIC METABOLIC PANEL      Result Value Ref Range   Sodium 135 (*) 137 - 147 mEq/L   Potassium 4.3  3.7 - 5.3 mEq/L   Chloride 97  96 - 112 mEq/L   CO2 19  19 - 32 mEq/L   Glucose, Bld 230 (*) 70 - 99 mg/dL   BUN 66 (*) 6 - 23 mg/dL   Creatinine, Ser 2.13 (*) 0.50 - 1.10 mg/dL   Calcium 8.2 (*) 8.4 - 10.5 mg/dL   GFR calc non Af Amer 6 (*) >90 mL/min   GFR calc Af Amer 6 (*) >90 mL/min  GLUCOSE, CAPILLARY      Result Value Ref Range   Glucose-Capillary 190 (*) 70 - 99 mg/dL  GLUCOSE, CAPILLARY      Result Value Ref Range   Glucose-Capillary 200 (*) 70 - 99 mg/dL  GLUCOSE, CAPILLARY      Result Value Ref Range   Glucose-Capillary 246 (*) 70 - 99 mg/dL  GLUCOSE, CAPILLARY      Result Value Ref  Range   Glucose-Capillary 175 (*) 70 - 99 mg/dL  GLUCOSE, CAPILLARY      Result Value Ref Range    Glucose-Capillary 260 (*) 70 - 99 mg/dL  RENAL FUNCTION PANEL      Result Value Ref Range   Sodium 134 (*) 137 - 147 mEq/L   Potassium 4.2  3.7 - 5.3 mEq/L   Chloride 96  96 - 112 mEq/L   CO2 19  19 - 32 mEq/L   Glucose, Bld 219 (*) 70 - 99 mg/dL   BUN 72 (*) 6 - 23 mg/dL   Creatinine, Ser 1.61 (*) 0.50 - 1.10 mg/dL   Calcium 8.7  8.4 - 09.6 mg/dL   Phosphorus 6.8 (*) 2.3 - 4.6 mg/dL   Albumin 2.6 (*) 3.5 - 5.2 g/dL   GFR calc non Af Amer 5 (*) >90 mL/min   GFR calc Af Amer 6 (*) >90 mL/min  GLUCOSE, CAPILLARY      Result Value Ref Range   Glucose-Capillary 262 (*) 70 - 99 mg/dL  GLUCOSE, CAPILLARY      Result Value Ref Range   Glucose-Capillary 271 (*) 70 - 99 mg/dL  GLUCOSE, CAPILLARY      Result Value Ref Range   Glucose-Capillary 244 (*) 70 - 99 mg/dL  GLUCOSE, CAPILLARY      Result Value Ref Range   Glucose-Capillary 234 (*) 70 - 99 mg/dL  GLUCOSE, CAPILLARY      Result Value Ref Range   Glucose-Capillary 208 (*) 70 - 99 mg/dL  GLUCOSE, CAPILLARY      Result Value Ref Range   Glucose-Capillary 182 (*) 70 - 99 mg/dL  GLUCOSE, CAPILLARY      Result Value Ref Range   Glucose-Capillary 178 (*) 70 - 99 mg/dL  RENAL FUNCTION PANEL      Result Value Ref Range   Sodium 133 (*) 137 - 147 mEq/L   Potassium 4.4  3.7 - 5.3 mEq/L   Chloride 92 (*) 96 - 112 mEq/L   CO2 22  19 - 32 mEq/L   Glucose, Bld 245 (*) 70 - 99 mg/dL   BUN 64 (*) 6 - 23 mg/dL   Creatinine, Ser 0.45 (*) 0.50 - 1.10 mg/dL   Calcium 8.8  8.4 - 40.9 mg/dL   Phosphorus 5.2 (*) 2.3 - 4.6 mg/dL   Albumin 2.6 (*) 3.5 - 5.2 g/dL   GFR calc non Af Amer 6 (*) >90 mL/min   GFR calc Af Amer 7 (*) >90 mL/min  GLUCOSE, CAPILLARY      Result Value Ref Range   Glucose-Capillary 194 (*) 70 - 99 mg/dL   Comment 1 Documented in Chart     Comment 2 Notify RN    GLUCOSE, CAPILLARY      Result Value Ref Range   Glucose-Capillary 208 (*) 70 - 99 mg/dL  GLUCOSE, CAPILLARY      Result Value Ref Range    Glucose-Capillary 306 (*) 70 - 99 mg/dL  GLUCOSE, CAPILLARY      Result Value Ref Range   Glucose-Capillary 329 (*) 70 - 99 mg/dL  PARATHYROID HORMONE, INTACT (NO CA)      Result Value Ref Range   PTH 439.0 (*) 14.0 - 72.0 pg/mL  GLUCOSE, CAPILLARY      Result Value Ref Range   Glucose-Capillary 232 (*) 70 - 99 mg/dL  BASIC METABOLIC PANEL      Result Value Ref Range   Sodium 134 (*) 137 - 147 mEq/L  Potassium 4.0  3.7 - 5.3 mEq/L   Chloride 94 (*) 96 - 112 mEq/L   CO2 22  19 - 32 mEq/L   Glucose, Bld 415 (*) 70 - 99 mg/dL   BUN 40 (*) 6 - 23 mg/dL   Creatinine, Ser 1.61 (*) 0.50 - 1.10 mg/dL   Calcium 8.3 (*) 8.4 - 10.5 mg/dL   GFR calc non Af Amer 9 (*) >90 mL/min   GFR calc Af Amer 11 (*) >90 mL/min  CBC      Result Value Ref Range   WBC 8.5  4.0 - 10.5 K/uL   RBC 3.45 (*) 3.87 - 5.11 MIL/uL   Hemoglobin 9.1 (*) 12.0 - 15.0 g/dL   HCT 09.6 (*) 04.5 - 40.9 %   MCV 80.0  78.0 - 100.0 fL   MCH 26.4  26.0 - 34.0 pg   MCHC 33.0  30.0 - 36.0 g/dL   RDW 81.1  91.4 - 78.2 %   Platelets 181  150 - 400 K/uL  GLUCOSE, CAPILLARY      Result Value Ref Range   Glucose-Capillary 252 (*) 70 - 99 mg/dL  HEPATITIS B SURFACE ANTIGEN      Result Value Ref Range   Hepatitis B Surface Ag NEGATIVE  NEGATIVE  HEPATITIS B SURFACE ANTIBODY      Result Value Ref Range   Hep B S Ab NEGATIVE  NEGATIVE  HEPATITIS B CORE ANTIBODY, TOTAL      Result Value Ref Range   Hep B Core Total Ab NON REACTIVE  NON REACTIVE  GLUCOSE, CAPILLARY      Result Value Ref Range   Glucose-Capillary 242 (*) 70 - 99 mg/dL  GLUCOSE, CAPILLARY      Result Value Ref Range   Glucose-Capillary 418 (*) 70 - 99 mg/dL  GLUCOSE, CAPILLARY      Result Value Ref Range   Glucose-Capillary 347 (*) 70 - 99 mg/dL  GLUCOSE, CAPILLARY      Result Value Ref Range   Glucose-Capillary 323 (*) 70 - 99 mg/dL  GLUCOSE, CAPILLARY      Result Value Ref Range   Glucose-Capillary 335 (*) 70 - 99 mg/dL  GLUCOSE, CAPILLARY       Result Value Ref Range   Glucose-Capillary 255 (*) 70 - 99 mg/dL   Comment 1 Notify RN    GLUCOSE, CAPILLARY      Result Value Ref Range   Glucose-Capillary 243 (*) 70 - 99 mg/dL  GLUCOSE, CAPILLARY      Result Value Ref Range   Glucose-Capillary 329 (*) 70 - 99 mg/dL  GLUCOSE, CAPILLARY      Result Value Ref Range   Glucose-Capillary 341 (*) 70 - 99 mg/dL  GLUCOSE, CAPILLARY      Result Value Ref Range   Glucose-Capillary 276 (*) 70 - 99 mg/dL  GLUCOSE, CAPILLARY      Result Value Ref Range   Glucose-Capillary 283 (*) 70 - 99 mg/dL  GLUCOSE, CAPILLARY      Result Value Ref Range   Glucose-Capillary 367 (*) 70 - 99 mg/dL  COMPREHENSIVE METABOLIC PANEL      Result Value Ref Range   Sodium 132 (*) 137 - 147 mEq/L   Potassium 4.4  3.7 - 5.3 mEq/L   Chloride 93 (*) 96 - 112 mEq/L   CO2 22  19 - 32 mEq/L   Glucose, Bld 282 (*) 70 - 99 mg/dL   BUN 48 (*) 6 - 23 mg/dL  Creatinine, Ser 5.79 (*) 0.50 - 1.10 mg/dL   Calcium 8.8  8.4 - 40.9 mg/dL   Total Protein 6.1  6.0 - 8.3 g/dL   Albumin 2.4 (*) 3.5 - 5.2 g/dL   AST 12  0 - 37 U/L   ALT 7  0 - 35 U/L   Alkaline Phosphatase 106  39 - 117 U/L   Total Bilirubin <0.2 (*) 0.3 - 1.2 mg/dL   GFR calc non Af Amer 8 (*) >90 mL/min   GFR calc Af Amer 9 (*) >90 mL/min  CBC      Result Value Ref Range   WBC 9.5  4.0 - 10.5 K/uL   RBC 3.59 (*) 3.87 - 5.11 MIL/uL   Hemoglobin 9.6 (*) 12.0 - 15.0 g/dL   HCT 81.1 (*) 91.4 - 78.2 %   MCV 80.5  78.0 - 100.0 fL   MCH 26.7  26.0 - 34.0 pg   MCHC 33.2  30.0 - 36.0 g/dL   RDW 95.6  21.3 - 08.6 %   Platelets 224  150 - 400 K/uL  PHOSPHORUS      Result Value Ref Range   Phosphorus 4.8 (*) 2.3 - 4.6 mg/dL  GLUCOSE, CAPILLARY      Result Value Ref Range   Glucose-Capillary 280 (*) 70 - 99 mg/dL  GLUCOSE, CAPILLARY      Result Value Ref Range   Glucose-Capillary 172 (*) 70 - 99 mg/dL   Comment 1 Notify RN    GLUCOSE, CAPILLARY      Result Value Ref Range   Glucose-Capillary 326 (*) 70 -  99 mg/dL  GLUCOSE, CAPILLARY      Result Value Ref Range   Glucose-Capillary 343 (*) 70 - 99 mg/dL  GLUCOSE, CAPILLARY      Result Value Ref Range   Glucose-Capillary 290 (*) 70 - 99 mg/dL  GLUCOSE, CAPILLARY      Result Value Ref Range   Glucose-Capillary 208 (*) 70 - 99 mg/dL  GLUCOSE, CAPILLARY      Result Value Ref Range   Glucose-Capillary 350 (*) 70 - 99 mg/dL  GLUCOSE, CAPILLARY      Result Value Ref Range   Glucose-Capillary 479 (*) 70 - 99 mg/dL  GLUCOSE, CAPILLARY      Result Value Ref Range   Glucose-Capillary 416 (*) 70 - 99 mg/dL  CBC      Result Value Ref Range   WBC 6.6  4.0 - 10.5 K/uL   RBC 3.15 (*) 3.87 - 5.11 MIL/uL   Hemoglobin 8.4 (*) 12.0 - 15.0 g/dL   HCT 57.8 (*) 46.9 - 62.9 %   MCV 81.0  78.0 - 100.0 fL   MCH 26.7  26.0 - 34.0 pg   MCHC 32.9  30.0 - 36.0 g/dL   RDW 52.8  41.3 - 24.4 %   Platelets 190  150 - 400 K/uL  RENAL FUNCTION PANEL      Result Value Ref Range   Sodium 133 (*) 137 - 147 mEq/L   Potassium 4.3  3.7 - 5.3 mEq/L   Chloride 94 (*) 96 - 112 mEq/L   CO2 23  19 - 32 mEq/L   Glucose, Bld 442 (*) 70 - 99 mg/dL   BUN 39 (*) 6 - 23 mg/dL   Creatinine, Ser 0.10 (*) 0.50 - 1.10 mg/dL   Calcium 8.7  8.4 - 27.2 mg/dL   Phosphorus 4.8 (*) 2.3 - 4.6 mg/dL   Albumin 2.2 (*) 3.5 -  5.2 g/dL   GFR calc non Af Amer 7 (*) >90 mL/min   GFR calc Af Amer 8 (*) >90 mL/min  GLUCOSE, CAPILLARY      Result Value Ref Range   Glucose-Capillary 189 (*) 70 - 99 mg/dL  GLUCOSE, CAPILLARY      Result Value Ref Range   Glucose-Capillary 264 (*) 70 - 99 mg/dL  GLUCOSE, CAPILLARY      Result Value Ref Range   Glucose-Capillary 350 (*) 70 - 99 mg/dL  GLUCOSE, CAPILLARY      Result Value Ref Range   Glucose-Capillary 508 (*) 70 - 99 mg/dL   Comment 1 Notify RN    GLUCOSE, CAPILLARY      Result Value Ref Range   Glucose-Capillary 287 (*) 70 - 99 mg/dL  GLUCOSE, CAPILLARY      Result Value Ref Range   Glucose-Capillary 212 (*) 70 - 99 mg/dL  GLUCOSE,  CAPILLARY      Result Value Ref Range   Glucose-Capillary 426 (*) 70 - 99 mg/dL  GLUCOSE, CAPILLARY      Result Value Ref Range   Glucose-Capillary 488 (*) 70 - 99 mg/dL  GLUCOSE, CAPILLARY      Result Value Ref Range   Glucose-Capillary 375 (*) 70 - 99 mg/dL  GLUCOSE, CAPILLARY      Result Value Ref Range   Glucose-Capillary 300 (*) 70 - 99 mg/dL  GLUCOSE, CAPILLARY      Result Value Ref Range   Glucose-Capillary 310 (*) 70 - 99 mg/dL  GLUCOSE, CAPILLARY      Result Value Ref Range   Glucose-Capillary 410 (*) 70 - 99 mg/dL  GLUCOSE, CAPILLARY      Result Value Ref Range   Glucose-Capillary 385 (*) 70 - 99 mg/dL  GLUCOSE, CAPILLARY      Result Value Ref Range   Glucose-Capillary 275 (*) 70 - 99 mg/dL  GLUCOSE, CAPILLARY      Result Value Ref Range   Glucose-Capillary 311 (*) 70 - 99 mg/dL  GLUCOSE, CAPILLARY      Result Value Ref Range   Glucose-Capillary 246 (*) 70 - 99 mg/dL  GLUCOSE, CAPILLARY      Result Value Ref Range   Glucose-Capillary 279 (*) 70 - 99 mg/dL  GLUCOSE, CAPILLARY      Result Value Ref Range   Glucose-Capillary 258 (*) 70 - 99 mg/dL  GLUCOSE, CAPILLARY      Result Value Ref Range   Glucose-Capillary 235 (*) 70 - 99 mg/dL  GLUCOSE, CAPILLARY      Result Value Ref Range   Glucose-Capillary 150 (*) 70 - 99 mg/dL  CBC      Result Value Ref Range   WBC 7.9  4.0 - 10.5 K/uL   RBC 3.31 (*) 3.87 - 5.11 MIL/uL   Hemoglobin 8.8 (*) 12.0 - 15.0 g/dL   HCT 16.127.2 (*) 09.636.0 - 04.546.0 %   MCV 82.2  78.0 - 100.0 fL   MCH 26.6  26.0 - 34.0 pg   MCHC 32.4  30.0 - 36.0 g/dL   RDW 40.914.0  81.111.5 - 91.415.5 %   Platelets 231  150 - 400 K/uL  RENAL FUNCTION PANEL      Result Value Ref Range   Sodium 133 (*) 137 - 147 mEq/L   Potassium 4.2  3.7 - 5.3 mEq/L   Chloride 92 (*) 96 - 112 mEq/L   CO2 24  19 - 32 mEq/L   Glucose, Bld 169 (*) 70 -  99 mg/dL   BUN 54 (*) 6 - 23 mg/dL   Creatinine, Ser 4.09 (*) 0.50 - 1.10 mg/dL   Calcium 8.8  8.4 - 81.1 mg/dL   Phosphorus 5.6  (*) 2.3 - 4.6 mg/dL   Albumin 2.5 (*) 3.5 - 5.2 g/dL   GFR calc non Af Amer 5 (*) >90 mL/min   GFR calc Af Amer 6 (*) >90 mL/min  GLUCOSE, CAPILLARY      Result Value Ref Range   Glucose-Capillary 100 (*) 70 - 99 mg/dL  GLUCOSE, CAPILLARY      Result Value Ref Range   Glucose-Capillary 304 (*) 70 - 99 mg/dL  GLUCOSE, CAPILLARY      Result Value Ref Range   Glucose-Capillary 256 (*) 70 - 99 mg/dL  GLUCOSE, CAPILLARY      Result Value Ref Range   Glucose-Capillary 369 (*) 70 - 99 mg/dL  GLUCOSE, CAPILLARY      Result Value Ref Range   Glucose-Capillary 272 (*) 70 - 99 mg/dL  CBG MONITORING, ED      Result Value Ref Range   Glucose-Capillary 473 (*) 70 - 99 mg/dL  I-STAT VENOUS BLOOD GAS, ED      Result Value Ref Range   pH, Ven 7.314 (*) 7.250 - 7.300   pCO2, Ven 40.7 (*) 45.0 - 50.0 mmHg   pO2, Ven 29.0 (*) 30.0 - 45.0 mmHg   Bicarbonate 20.7  20.0 - 24.0 mEq/L   TCO2 22  0 - 100 mmol/L   O2 Saturation 49.0     Acid-base deficit 5.0 (*) 0.0 - 2.0 mmol/L   Sample type VENOUS     Comment NOTIFIED PHYSICIAN    I-STAT TROPOININ, ED      Result Value Ref Range   Troponin i, poc 0.02  0.00 - 0.08 ng/mL   Comment 3           CBG MONITORING, ED      Result Value Ref Range   Glucose-Capillary 436 (*) 70 - 99 mg/dL  CBG MONITORING, ED      Result Value Ref Range   Glucose-Capillary 374 (*) 70 - 99 mg/dL  CBG MONITORING, ED      Result Value Ref Range   Glucose-Capillary 187 (*) 70 - 99 mg/dL   Comment 1 Notify RN    CBG MONITORING, ED      Result Value Ref Range   Glucose-Capillary 91  70 - 99 mg/dL    Time coordinating discharge: 45 minutes spent examining the patient and reviewing the chart and laboratories and performing discharge summary dictation and arrange for medications and outpatient followup  Signed: Pearla Dubonnet, MD 07/26/2013, 7:05 AM

## 2013-08-30 ENCOUNTER — Encounter: Payer: Self-pay | Admitting: Vascular Surgery

## 2013-08-31 ENCOUNTER — Encounter: Payer: Self-pay | Admitting: Vascular Surgery

## 2013-08-31 ENCOUNTER — Other Ambulatory Visit (HOSPITAL_COMMUNITY): Payer: Self-pay

## 2013-10-03 ENCOUNTER — Encounter: Payer: Self-pay | Admitting: Vascular Surgery

## 2013-10-04 ENCOUNTER — Encounter: Payer: Self-pay | Admitting: Vascular Surgery

## 2013-10-04 ENCOUNTER — Ambulatory Visit (INDEPENDENT_AMBULATORY_CARE_PROVIDER_SITE_OTHER): Payer: Medicaid Other | Admitting: Vascular Surgery

## 2013-10-04 ENCOUNTER — Encounter: Payer: Self-pay | Admitting: *Deleted

## 2013-10-04 ENCOUNTER — Ambulatory Visit (HOSPITAL_COMMUNITY)
Admission: RE | Admit: 2013-10-04 | Discharge: 2013-10-04 | Disposition: A | Discharge status: Home / Self Care | Payer: Medicaid Other | Source: Ambulatory Visit | Attending: Vascular Surgery | Admitting: Vascular Surgery

## 2013-10-04 VITALS — BP 160/65 | HR 70 | Ht 61.0 in | Wt 183.2 lb

## 2013-10-04 DIAGNOSIS — N186 End stage renal disease: Secondary | ICD-10-CM | POA: Insufficient documentation

## 2013-10-04 DIAGNOSIS — Z4931 Encounter for adequacy testing for hemodialysis: Secondary | ICD-10-CM | POA: Insufficient documentation

## 2013-10-04 NOTE — Progress Notes (Signed)
   Established Dialysis Access  History of Present Illness  [name] is a 53 y.o. (11/05/1960) female who presents for follow-up of brachiocephalic AV fistula placed 07/18/2013. She had a right IJ diatek catheter placed by Dr. Arbie CookeyEarly on 07/13/2013. She dialyzes on Tuesdays, Thursdays, and Saturdays. She has been complaining of fevers in the 100 degree range on days she dialyzes that improve with acetaminophen. She sees Dr. Kathrene BongoGoldsborough and she is aware of this. She has also been complaining of soreness around her right IJ catheter site. She notes her right hand getting tired intermittently with daily tasks and intermittent hand cramping that resolves in less than a minute.   On ROS today: she denies any chest pain, fever, shortness of breath and non-healing wounds of the hands.  Physical Examination  Filed Vitals:   10/04/13 1553  BP: 160/65  Pulse: 70  Height: 5\' 1"  (1.549 m)  Weight: 183 lb 3.2 oz (83.099 kg)  SpO2: 100%   Body mass index is 34.63 kg/(m^2).  General: A&O x 3, WDWN female in NAD  Pulmonary: Non-labored breathing   Neck: right IJ catheter in place  Vascular: Vessel Right Left  Radial Palpable Palpable  Ulnar Palpable Palpable   Musculoskeletal: M/S 5/5 throughout.  Extremities without  ischemic changes.  Palpable thrill in right brachiocephalic fistula. No muscle atrophy.   Neurologic: Pain and light touch intact in extremities.   Non-Invasive Vascular Imaging  Right arm Access Duplex  (Date: 10/04/2013):   Diameters:  .68 cm  Depth:  1.02 cm  PSV:  204 c/s  Medical Decision Making  [name] is a 53 y.o. female who presents with ESRD requiring hemodialysis and right brachiocephalic fistula placed 07/18/13. She dialyzes on Tuesdays, Thursdays and Saturdays. She has a palpable thrill and adequate vein maturation. The fistula is patent. It has not been a full twelve weeks since surgery, but she may use the fistula in conjunction with her  catheter during dialysis. Following the completed twelve weeks, her catheter may be removed. Dr. Edilia Boickson removed the sutures around her dialysis catheter and the patient felt relief with her pain. No follow-up indicated at this time.   Maris BergerKimberly Trinh, PA-C [facility] [place] Office: [phone] Pager: [phone]  10/04/2013, 4:13 PM  Agree with above. At this point I think it is safe to begin to use her AV fistula although it may be best to use one needle initially. She was having significant pain at her catheter site which I think was related to her sutures which I removed. The catheter seemed well incorporated but I instructed her to be careful to be sure this was taped carefully so that it didn't get pulled out. We will see her back as needed.  Waverly Ferrarihristopher Dickson, MD, FACS Beeper [phone] 10/04/2013

## 2013-11-21 DIAGNOSIS — Z0279 Encounter for issue of other medical certificate: Secondary | ICD-10-CM | POA: Diagnosis not present

## 2013-12-11 ENCOUNTER — Telehealth: Payer: Self-pay | Admitting: *Deleted

## 2013-12-11 NOTE — Telephone Encounter (Signed)
Patient called to report painful cannulation with the 2nd needle at each HD visit. (Tuesday, Thursday and Saturdays at [facility] [phone]) I called Britta Mccreedy RN at the [facility] and she said that they did blood cultures that came back positive on the patient and they have put her on Vancomycin until [facility] September. They are not going to use her AVF but will use her Diatek in the meantime. They will call us after this Vanc therapy per Dr. Kathrene Bongo, if they want [place] to do a duplex and doctor's visit. I talked to the patient and she is in agreement with this plan.

## 2013-12-19 ENCOUNTER — Other Ambulatory Visit: Payer: Self-pay | Admitting: *Deleted

## 2013-12-19 ENCOUNTER — Encounter: Payer: Self-pay | Admitting: Vascular Surgery

## 2013-12-19 DIAGNOSIS — N186 End stage renal disease: Secondary | ICD-10-CM

## 2013-12-20 ENCOUNTER — Ambulatory Visit (HOSPITAL_COMMUNITY)
Admission: RE | Admit: 2013-12-20 | Discharge: 2013-12-20 | Disposition: A | Discharge status: Home / Self Care | Payer: Medicaid Other | Source: Ambulatory Visit | Attending: Vascular Surgery | Admitting: Vascular Surgery

## 2013-12-20 DIAGNOSIS — N186 End stage renal disease: Secondary | ICD-10-CM | POA: Insufficient documentation

## 2013-12-20 NOTE — Progress Notes (Signed)
Right chest dressing CDI prior to DC home

## 2013-12-20 NOTE — Progress Notes (Signed)
  Catheter Removal Procedure Note    Diagnosis: ESRD  Plan:  Remove right diatek catheter  Consent signed:  Yes.   Time out completed:  Yes.   Coumadin:  No. PT/INR (if applicable):   Other labs:  Procedure: 1.  Sterile prepping and draping over catheter area 2. 5 ml 2% lidocaine plain instilled at removal site. 3.  right catheter removed in its entirety with cuff in tact. 4.  Complications:  none 5. Tip of catheter sent for culture:  Yes.     Patient tolerated procedure well:  Yes.   Pressure held, no bleeding noted, dressing applied Instructions given to the pt regarding wound care and bleeding.  Other:  Doreatha Massed, PA-C 12/20/2013 12:51 PM

## 2013-12-20 NOTE — Progress Notes (Addendum)
H&P    CC:  Catheter removal   HPI:  This is a 53 y.o. female here for diatek catheter removal.  She had it placed back in April 2015 by Dr. Arbie Cookey.  She also had a right brachiocephalic AVF placed in April 2015 by Dr. Darrick Penna.  She states they are able to stick this with one needle, but the fistula is too deep to stick with the 2nd needle.  She does have an appt with Dr. Arbie Cookey later this month for evaluation for possible superficialization.  She is having her catheter removed today as she states she had a blood infection.  A  New diatek catheter will be placed on Friday.    She is not on any blood thinners.  She does take insulin for her diabetes.  She is on a beta blocker and CCB for her hypertension.  She dialyzes T/T/S.  She is right handed.   Past Medical History  Diagnosis Date  . Hypertension   . Diabetes mellitus   . Anemia     FH:  Non-Contributory  History   Social History  . Marital Status: Single    Spouse Name: N/A    Number of Children: N/A  . Years of Education: N/A   Occupational History  . Not on file.   Social History Main Topics  . Smoking status: Never Smoker   . Smokeless tobacco: Not on file  . Alcohol Use: No  . Drug Use: No  . Sexual Activity: No   Other Topics Concern  . Not on file   Social History Narrative  . No narrative on file    Allergies  Allergen Reactions  . Codeine Other (See Comments)    hallucinations    Current Outpatient Prescriptions  Medication Sig Dispense Refill  . albuterol (PROVENTIL) (2.5 MG/3ML) 0.083% nebulizer solution Take 2.5 mg by nebulization every 6 (six) hours as needed for wheezing or shortness of breath.      Marland Kitchen amLODipine (NORVASC) 5 MG tablet Take 1 tablet (5 mg total) by mouth at bedtime.  30 tablet  2  . atenolol (TENORMIN) 25 MG tablet Take 1 tablet (25 mg total) by mouth at bedtime.  30 tablet  2  . darbepoetin (ARANESP) 60 MCG/0.3ML SOLN injection Inject 0.3 mLs (60 mcg total) into the skin every  Saturday at 6 PM.  4.2 mL  0  . doxercalciferol (HECTOROL) 4 MCG/2ML injection Inject 2 mLs (4 mcg total) into the vein Every Tuesday,Thursday,and Saturday with dialysis.  2 mL  0  . ferric gluconate 125 mg in sodium chloride 0.9 % 100 mL Inject 125 mg into the vein Every Tuesday,Thursday,and Saturday with dialysis.  125 ampule  0  . insulin aspart (NOVOLOG) 100 UNIT/ML injection Inject 0-9 Units into the skin 4 (four) times daily -  before meals and at bedtime.  10 mL  11  . insulin glargine (LANTUS) 100 UNIT/ML injection Inject 0.4 mLs (40 Units total) into the skin at bedtime.  10 mL  11  . linagliptin (TRADJENTA) 5 MG TABS tablet Take 1 tablet (5 mg total) by mouth daily.  30 tablet  5  . multivitamin (RENA-VIT) TABS tablet Take 1 tablet by mouth at bedtime.  30 tablet  5  . pantoprazole (PROTONIX) 40 MG tablet Take 1 tablet (40 mg total) by mouth daily.  30 tablet  5  . polyethylene glycol (MIRALAX / GLYCOLAX) packet Take 17 g by mouth daily.  14 each  0  .  promethazine (PHENERGAN) 25 MG tablet Take 1 tablet (25 mg total) by mouth every 6 (six) hours as needed for nausea or vomiting.  30 tablet  0   No current facility-administered medications for this encounter.    ROS:  See HPI  PHYSICAL EXAM  Filed Vitals:   12/20/13 1210  BP: 155/66  Pulse: 71  Temp: 97.8 F (36.6 C)    Gen:  Well developed well nourished HEENT:  normocephalic Neck:  Right IJ cath in place Heart:  regular Lungs:  Non-labored Abdomen:  Soft, NT/ND Extremities:  2+ radial pulses bilaterally; +thrill within right BC AVF Skin:  No obvious rashes Neuro:  In tact  Lab/X-ray:  Impression: This is a 53 y.o. female here for diatek catheter removal  Plan:  Removal of right diatek catheter  Doreatha Massed, PA-C [facility] [phone] 12/20/2013 12:47 PM

## 2013-12-21 ENCOUNTER — Other Ambulatory Visit: Payer: Self-pay | Admitting: *Deleted

## 2013-12-21 DIAGNOSIS — N186 End stage renal disease: Secondary | ICD-10-CM

## 2013-12-21 MED ORDER — CHLORHEXIDINE GLUCONATE CLOTH 2 % EX PADS: 6.0000 | MEDICATED_PAD | Freq: Once | CUTANEOUS | Status: DC

## 2013-12-21 MED ORDER — VANCOMYCIN HCL IN DEXTROSE 1-5 GM/200ML-% IV SOLN
1000.0000 mg | INTRAVENOUS | Status: AC
  Administered 2013-12-22: 1000 mg via INTRAVENOUS
  Filled 2013-12-21: qty 200

## 2013-12-21 NOTE — Progress Notes (Signed)
I was unable to reach patient by phone.  I left  A message on voice mail.  I instructed the patient to arrive at [facility] Main entrance at 5:30   , nothing to eat or drink after midnight.   I instructed the patient to take the following medications in the am with just enough water to get them down: Amlodipine, Tenormin, Protonix, use inhaler if needed and bring it to the hospital with you, may take Protonix if needed. I asked patient to not wear any lotions, powders, cologne, jewelry, piercing, make-up or nail polish.  I asked the patient to call [phone], in the am if there were any questions or problems.

## 2013-12-22 ENCOUNTER — Ambulatory Visit (HOSPITAL_COMMUNITY)
Admission: RE | Admit: 2013-12-22 | Discharge: 2013-12-22 | Disposition: A | Discharge status: Home / Self Care | Payer: Medicaid Other | Source: Ambulatory Visit | Attending: Vascular Surgery | Admitting: Vascular Surgery

## 2013-12-22 ENCOUNTER — Ambulatory Visit (HOSPITAL_COMMUNITY): Payer: Medicaid Other | Admitting: Certified Registered"

## 2013-12-22 ENCOUNTER — Encounter (HOSPITAL_COMMUNITY): Payer: Medicaid Other | Admitting: Certified Registered"

## 2013-12-22 ENCOUNTER — Encounter (HOSPITAL_COMMUNITY): Payer: Self-pay | Admitting: Certified Registered"

## 2013-12-22 ENCOUNTER — Encounter (HOSPITAL_COMMUNITY)
Admission: RE | Disposition: A | Discharge status: Home / Self Care | Payer: Self-pay | Source: Ambulatory Visit | Attending: Vascular Surgery

## 2013-12-22 ENCOUNTER — Ambulatory Visit (HOSPITAL_COMMUNITY): Payer: Medicaid Other

## 2013-12-22 DIAGNOSIS — Z6835 Body mass index (BMI) 35.0-35.9, adult: Secondary | ICD-10-CM | POA: Insufficient documentation

## 2013-12-22 DIAGNOSIS — N186 End stage renal disease: Secondary | ICD-10-CM | POA: Insufficient documentation

## 2013-12-22 DIAGNOSIS — J4489 Other specified chronic obstructive pulmonary disease: Secondary | ICD-10-CM | POA: Insufficient documentation

## 2013-12-22 DIAGNOSIS — K219 Gastro-esophageal reflux disease without esophagitis: Secondary | ICD-10-CM | POA: Insufficient documentation

## 2013-12-22 DIAGNOSIS — R011 Cardiac murmur, unspecified: Secondary | ICD-10-CM | POA: Insufficient documentation

## 2013-12-22 DIAGNOSIS — E119 Type 2 diabetes mellitus without complications: Secondary | ICD-10-CM | POA: Diagnosis not present

## 2013-12-22 DIAGNOSIS — I120 Hypertensive chronic kidney disease with stage 5 chronic kidney disease or end stage renal disease: Secondary | ICD-10-CM | POA: Insufficient documentation

## 2013-12-22 DIAGNOSIS — J449 Chronic obstructive pulmonary disease, unspecified: Secondary | ICD-10-CM | POA: Diagnosis not present

## 2013-12-22 DIAGNOSIS — I359 Nonrheumatic aortic valve disorder, unspecified: Secondary | ICD-10-CM | POA: Insufficient documentation

## 2013-12-22 DIAGNOSIS — Z794 Long term (current) use of insulin: Secondary | ICD-10-CM | POA: Insufficient documentation

## 2013-12-22 DIAGNOSIS — N184 Chronic kidney disease, stage 4 (severe): Secondary | ICD-10-CM

## 2013-12-22 HISTORY — PX: INSERTION OF DIALYSIS CATHETER: SHX1324

## 2013-12-22 LAB — GLUCOSE, CAPILLARY
Glucose-Capillary: 245 mg/dL — ABNORMAL HIGH (ref 70–99)
Glucose-Capillary: 257 mg/dL — ABNORMAL HIGH (ref 70–99)

## 2013-12-22 LAB — POCT I-STAT 4, (NA,K, GLUC, HGB,HCT)
GLUCOSE: 258 mg/dL — AB (ref 70–99)
HEMATOCRIT: 35 % — AB (ref 36.0–46.0)
Hemoglobin: 11.9 g/dL — ABNORMAL LOW (ref 12.0–15.0)
Potassium: 4.4 mEq/L (ref 3.7–5.3)
Sodium: 131 mEq/L — ABNORMAL LOW (ref 137–147)

## 2013-12-22 SURGERY — INSERTION OF DIALYSIS CATHETER
Anesthesia: Monitor Anesthesia Care | Site: Neck | Laterality: Left

## 2013-12-22 MED ORDER — LIDOCAINE-EPINEPHRINE (PF) 1 %-1:200000 IJ SOLN
INTRAMUSCULAR | Status: AC
  Filled 2013-12-22: qty 10

## 2013-12-22 MED ORDER — SODIUM CHLORIDE 0.9 % IV SOLN: INTRAVENOUS | Status: DC

## 2013-12-22 MED ORDER — SODIUM CHLORIDE 0.9 % IV SOLN
INTRAVENOUS | Status: DC | PRN
  Administered 2013-12-22: 07:00:00 via INTRAVENOUS

## 2013-12-22 MED ORDER — SODIUM CHLORIDE 0.9 % IR SOLN
Status: DC | PRN
  Administered 2013-12-22: 08:00:00

## 2013-12-22 MED ORDER — MEPERIDINE HCL 25 MG/ML IJ SOLN: 6.2500 mg | INTRAMUSCULAR | Status: DC | PRN

## 2013-12-22 MED ORDER — PROPOFOL 10 MG/ML IV BOLUS
INTRAVENOUS | Status: AC
  Filled 2013-12-22: qty 20

## 2013-12-22 MED ORDER — OXYCODONE HCL 5 MG PO TABS
5.0000 mg | ORAL_TABLET | Freq: Once | ORAL | Status: AC | PRN
  Administered 2013-12-22: 5 mg via ORAL

## 2013-12-22 MED ORDER — PROPOFOL 10 MG/ML IV BOLUS
INTRAVENOUS | Status: DC | PRN
  Administered 2013-12-22: 10 mg via INTRAVENOUS

## 2013-12-22 MED ORDER — MIDAZOLAM HCL 2 MG/2ML IJ SOLN
INTRAMUSCULAR | Status: AC
  Filled 2013-12-22: qty 2

## 2013-12-22 MED ORDER — CHLORHEXIDINE GLUCONATE CLOTH 2 % EX PADS: 6.0000 | MEDICATED_PAD | Freq: Once | CUTANEOUS | Status: DC

## 2013-12-22 MED ORDER — OXYCODONE HCL 5 MG PO TABS
ORAL_TABLET | ORAL | Status: AC
  Administered 2013-12-22: 5 mg via ORAL
  Filled 2013-12-22: qty 1

## 2013-12-22 MED ORDER — FENTANYL CITRATE 0.05 MG/ML IJ SOLN
INTRAMUSCULAR | Status: AC
  Filled 2013-12-22: qty 5

## 2013-12-22 MED ORDER — HEPARIN SODIUM (PORCINE) 1000 UNIT/ML IJ SOLN
INTRAMUSCULAR | Status: AC
  Filled 2013-12-22: qty 1

## 2013-12-22 MED ORDER — FENTANYL CITRATE 0.05 MG/ML IJ SOLN: 25.0000 ug | INTRAMUSCULAR | Status: DC | PRN

## 2013-12-22 MED ORDER — SUCCINYLCHOLINE CHLORIDE 20 MG/ML IJ SOLN
INTRAMUSCULAR | Status: AC
  Filled 2013-12-22: qty 1

## 2013-12-22 MED ORDER — PROMETHAZINE HCL 25 MG/ML IJ SOLN: 6.2500 mg | INTRAMUSCULAR | Status: DC | PRN

## 2013-12-22 MED ORDER — OXYCODONE HCL 5 MG/5ML PO SOLN: 5.0000 mg | Freq: Once | ORAL | Status: AC | PRN

## 2013-12-22 MED ORDER — PROPOFOL INFUSION 10 MG/ML OPTIME
INTRAVENOUS | Status: DC | PRN
  Administered 2013-12-22: 100 ug/kg/min via INTRAVENOUS

## 2013-12-22 MED ORDER — LIDOCAINE-EPINEPHRINE 0.5 %-1:200000 IJ SOLN
INTRAMUSCULAR | Status: DC | PRN
  Administered 2013-12-22: 7 mL

## 2013-12-22 MED ORDER — HEPARIN SODIUM (PORCINE) 1000 UNIT/ML IJ SOLN
INTRAMUSCULAR | Status: DC | PRN
  Administered 2013-12-22: 5000 [IU]

## 2013-12-22 SURGICAL SUPPLY — 46 items
APL SKNCLS STERI-STRIP NONHPOA (GAUZE/BANDAGES/DRESSINGS) ×1
BAG DECANTER FOR FLEXI CONT (MISCELLANEOUS) ×3 IMPLANT
BENZOIN TINCTURE PRP APPL 2/3 (GAUZE/BANDAGES/DRESSINGS) ×2 IMPLANT
CATH CANNON HEMO 15F 50CM (CATHETERS) IMPLANT
CATH CANNON HEMO 15FR 19 (HEMODIALYSIS SUPPLIES) IMPLANT
CATH CANNON HEMO 15FR 23CM (HEMODIALYSIS SUPPLIES) IMPLANT
CATH CANNON HEMO 15FR 31CM (HEMODIALYSIS SUPPLIES) IMPLANT
CATH CANNON HEMO 15FR 32 (HEMODIALYSIS SUPPLIES) IMPLANT
CATH CANNON HEMO 15FR 32CM (HEMODIALYSIS SUPPLIES) ×3 IMPLANT
CLOSURE STERI-STRIP 1/2X4 (GAUZE/BANDAGES/DRESSINGS) ×1
CLOSURE WOUND 1/2 X4 (GAUZE/BANDAGES/DRESSINGS) ×1
CLSR STERI-STRIP ANTIMIC 1/2X4 (GAUZE/BANDAGES/DRESSINGS) ×1 IMPLANT
COVER PROBE W GEL 5X96 (DRAPES) ×2 IMPLANT
COVER SURGICAL LIGHT HANDLE (MISCELLANEOUS) ×3 IMPLANT
DECANTER SPIKE VIAL GLASS SM (MISCELLANEOUS) ×3 IMPLANT
DRAPE C-ARM 42X72 X-RAY (DRAPES) ×1 IMPLANT
DRAPE CHEST BREAST 15X10 FENES (DRAPES) ×3 IMPLANT
GAUZE SPONGE 2X2 8PLY STRL LF (GAUZE/BANDAGES/DRESSINGS) ×1 IMPLANT
GAUZE SPONGE 4X4 16PLY XRAY LF (GAUZE/BANDAGES/DRESSINGS) ×3 IMPLANT
GLOVE SS BIOGEL STRL SZ 7.5 (GLOVE) ×1 IMPLANT
GLOVE SUPERSENSE BIOGEL SZ 7.5 (GLOVE) ×2
GOWN STRL REUS W/ TWL LRG LVL3 (GOWN DISPOSABLE) ×2 IMPLANT
GOWN STRL REUS W/TWL LRG LVL3 (GOWN DISPOSABLE) ×6
KIT BASIN OR (CUSTOM PROCEDURE TRAY) ×3 IMPLANT
KIT ROOM TURNOVER OR (KITS) ×3 IMPLANT
NDL 18GX1X1/2 (RX/OR ONLY) (NEEDLE) ×1 IMPLANT
NDL HYPO 25GX1X1/2 BEV (NEEDLE) ×1 IMPLANT
NEEDLE 18GX1X1/2 (RX/OR ONLY) (NEEDLE) ×3 IMPLANT
NEEDLE 22X1 1/2 (OR ONLY) (NEEDLE) ×3 IMPLANT
NEEDLE HYPO 25GX1X1/2 BEV (NEEDLE) ×3 IMPLANT
NS IRRIG 1000ML POUR BTL (IV SOLUTION) ×3 IMPLANT
PACK SURGICAL SETUP 50X90 (CUSTOM PROCEDURE TRAY) ×3 IMPLANT
PAD ARMBOARD 7.5X6 YLW CONV (MISCELLANEOUS) ×6 IMPLANT
SOAP 2 % CHG 4 OZ (WOUND CARE) ×3 IMPLANT
SPONGE GAUZE 2X2 STER 10/PKG (GAUZE/BANDAGES/DRESSINGS) ×2
STRIP CLOSURE SKIN 1/2X4 (GAUZE/BANDAGES/DRESSINGS) ×1 IMPLANT
SUT ETHILON 3 0 PS 1 (SUTURE) ×3 IMPLANT
SUT VICRYL 4-0 PS2 18IN ABS (SUTURE) ×3 IMPLANT
SYR 20CC LL (SYRINGE) ×3 IMPLANT
SYR 5ML LL (SYRINGE) ×6 IMPLANT
SYR CONTROL 10ML LL (SYRINGE) ×3 IMPLANT
SYRINGE 10CC LL (SYRINGE) ×3 IMPLANT
TAPE CLOTH SURG 4X10 WHT LF (GAUZE/BANDAGES/DRESSINGS) ×4 IMPLANT
TOWEL OR 17X24 6PK STRL BLUE (TOWEL DISPOSABLE) ×3 IMPLANT
TOWEL OR 17X26 10 PK STRL BLUE (TOWEL DISPOSABLE) ×3 IMPLANT
WATER STERILE IRR 1000ML POUR (IV SOLUTION) ×3 IMPLANT

## 2013-12-22 NOTE — Interval H&P Note (Signed)
History and Physical Interval Note:  12/22/2013 6:39 AM  [name]  has presented today for surgery, with the diagnosis of Mechanical complication of other vascular device, implant, and graft   The various methods of treatment have been discussed with the patient and family. After consideration of risks, benefits and other options for treatment, the patient has consented to  Procedure(s): INSERTION OF DIALYSIS CATHETER (N/A) as a surgical intervention .  The patient's history has been reviewed, patient examined, no change in status, stable for surgery.  I have reviewed the patient's chart and labs.  Questions were answered to the patient's satisfaction.     Jowan Skillin

## 2013-12-22 NOTE — H&P (View-Only) (Signed)
H&P    CC:  Catheter removal   HPI:  This is a 53 y.o. female here for diatek catheter removal.  She had it placed back in April 2015 by Dr. Arbie Cookey.  She also had a right brachiocephalic AVF placed in April 2015 by Dr. Darrick Penna.  She states they are able to stick this with one needle, but the fistula is too deep to stick with the 2nd needle.  She does have an appt with Dr. Arbie Cookey later this month for evaluation for possible superficialization.  She is having her catheter removed today as she states she had a blood infection.  A  New diatek catheter will be placed on Friday.    She is not on any blood thinners.  She does take insulin for her diabetes.  She is on a beta blocker and CCB for her hypertension.  She dialyzes T/T/S.  She is right handed.   Past Medical History  Diagnosis Date  . Hypertension   . Diabetes mellitus   . Anemia     FH:  Non-Contributory  History   Social History  . Marital Status: Single    Spouse Name: N/A    Number of Children: N/A  . Years of Education: N/A   Occupational History  . Not on file.   Social History Main Topics  . Smoking status: Never Smoker   . Smokeless tobacco: Not on file  . Alcohol Use: No  . Drug Use: No  . Sexual Activity: No   Other Topics Concern  . Not on file   Social History Narrative  . No narrative on file    Allergies  Allergen Reactions  . Codeine Other (See Comments)    hallucinations    Current Outpatient Prescriptions  Medication Sig Dispense Refill  . albuterol (PROVENTIL) (2.5 MG/3ML) 0.083% nebulizer solution Take 2.5 mg by nebulization every 6 (six) hours as needed for wheezing or shortness of breath.      Marland Kitchen amLODipine (NORVASC) 5 MG tablet Take 1 tablet (5 mg total) by mouth at bedtime.  30 tablet  2  . atenolol (TENORMIN) 25 MG tablet Take 1 tablet (25 mg total) by mouth at bedtime.  30 tablet  2  . darbepoetin (ARANESP) 60 MCG/0.3ML SOLN injection Inject 0.3 mLs (60 mcg total) into the skin every  Saturday at 6 PM.  4.2 mL  0  . doxercalciferol (HECTOROL) 4 MCG/2ML injection Inject 2 mLs (4 mcg total) into the vein Every Tuesday,Thursday,and Saturday with dialysis.  2 mL  0  . ferric gluconate 125 mg in sodium chloride 0.9 % 100 mL Inject 125 mg into the vein Every Tuesday,Thursday,and Saturday with dialysis.  125 ampule  0  . insulin aspart (NOVOLOG) 100 UNIT/ML injection Inject 0-9 Units into the skin 4 (four) times daily -  before meals and at bedtime.  10 mL  11  . insulin glargine (LANTUS) 100 UNIT/ML injection Inject 0.4 mLs (40 Units total) into the skin at bedtime.  10 mL  11  . linagliptin (TRADJENTA) 5 MG TABS tablet Take 1 tablet (5 mg total) by mouth daily.  30 tablet  5  . multivitamin (RENA-VIT) TABS tablet Take 1 tablet by mouth at bedtime.  30 tablet  5  . pantoprazole (PROTONIX) 40 MG tablet Take 1 tablet (40 mg total) by mouth daily.  30 tablet  5  . polyethylene glycol (MIRALAX / GLYCOLAX) packet Take 17 g by mouth daily.  14 each  0  .  promethazine (PHENERGAN) 25 MG tablet Take 1 tablet (25 mg total) by mouth every 6 (six) hours as needed for nausea or vomiting.  30 tablet  0   No current facility-administered medications for this encounter.    ROS:  See HPI  PHYSICAL EXAM  Filed Vitals:   12/20/13 1210  BP: 155/66  Pulse: 71  Temp: 97.8 F (36.6 C)    Gen:  Well developed well nourished HEENT:  normocephalic Neck:  Right IJ cath in place Heart:  regular Lungs:  Non-labored Abdomen:  Soft, NT/ND Extremities:  2+ radial pulses bilaterally; +thrill within right BC AVF Skin:  No obvious rashes Neuro:  In tact  Lab/X-ray:  Impression: This is a 53 y.o. female here for diatek catheter removal  Plan:  Removal of right diatek catheter  Katherine Massed, PA-C [facility] [phone] 12/20/2013 12:47 PM

## 2013-12-22 NOTE — Anesthesia Postprocedure Evaluation (Signed)
  Anesthesia Post-op Note  Patient: [name]  Procedure(s) Performed: Procedure(s): INSERTION OF DIALYSIS CATHETER (Left)  Patient Location: PACU  Anesthesia Type:MAC  Level of Consciousness: awake, alert , oriented and patient cooperative  Airway and Oxygen Therapy: Patient Spontanous Breathing  Post-op Pain: none  Post-op Assessment: Post-op Vital signs reviewed, Patient's Cardiovascular Status Stable, Respiratory Function Stable, Patent Airway, No signs of Nausea or vomiting and Pain level controlled  Post-op Vital Signs: Reviewed and stable  Last Vitals:  Filed Vitals:   12/22/13 0928  BP: 168/77  Pulse: 69  Temp:   Resp:     Complications: No apparent anesthesia complications

## 2013-12-22 NOTE — Op Note (Signed)
    OPERATIVE REPORT  DATE OF SURGERY: 12/22/2013  PATIENT: [name], 53 y.o. female MRN: [id number]  DOB: [birth year]  PRE-OPERATIVE DIAGNOSIS: End-stage renal disease  POST-OPERATIVE DIAGNOSIS:  Same  PROCEDURE: Left IJ hemodialysis catheter  SURGEON:  Gretta Began, M.D.  PHYSICIAN ASSISTANT: Nurse  ANESTHESIA:  1% lidocaine with local and IV sedation  EBL: Minimal ml  Total I/O In: 150 [I.V.:150] Out: -   BLOOD ADMINISTERED: None  DRAINS: None  SPECIMEN: None  COUNTS CORRECT:  YES  PLAN OF CARE: To PACU with chest x-ray pending   PATIENT DISPOSITION:  PACU - hemodynamically stable  PROCEDURE DETAILS: The patient was taken to the operating placed in a short area both right and left neck and chest prepped in usual sterile fashion. The patient had a recent removal of a right IJ catheter for presumed infection. The ultrasound and visualization was seen for the jugular vein on the left was widely patent. The patient was placed in Trendelenburg position. Using local anesthesia an 18-gauge needle under ultrasound visualization the internal jugular vein was accessed. A guidewire passed easily down to the low right atrium. A dilator and peel-away sheath were passed over the guidewire and the dilator and guidewire removed. A 27 cm hemodialysis catheter was placed through the peel-away sheath which was removed as well. The catheter tips were positioned right atrium. The catheter was then brought through subcutaneous tunnel through a separate stab incision. 2 lm ports were attached. Both lumens flushed and aspirated easily and were locked with 1000 unit per cc heparin. The catheter was secured to the skin with a 3-0 nylon stitch and the entry site was closed with a 4-0 subcuticular Vicryl stitch. Sterile dressing was applied the patient was taken to the recovery room in stable condition   Gretta Began, M.D. 12/22/2013 2:45 PM

## 2013-12-22 NOTE — Transfer of Care (Signed)
Immediate Anesthesia Transfer of Care Note  Patient: [name]  Procedure(s) Performed: Procedure(s): INSERTION OF DIALYSIS CATHETER (Left)  Patient Location: PACU  Anesthesia Type:MAC  Level of Consciousness: awake  Airway & Oxygen Therapy: Patient Spontanous Breathing and Patient connected to nasal cannula oxygen  Post-op Assessment: Report given to PACU RN, Post -op Vital signs reviewed and stable and Patient moving all extremities  Post vital signs: Reviewed and stable  Complications: No apparent anesthesia complications

## 2013-12-22 NOTE — Anesthesia Preprocedure Evaluation (Addendum)
Anesthesia Evaluation  Patient identified by MRN, date of birth, ID band Patient awake    Reviewed: Allergy & Precautions, H&P , NPO status , Patient's Chart, lab work & pertinent test results, reviewed documented beta blocker date and time   History of Anesthesia Complications Negative for: history of anesthetic complications  Airway Mallampati: II TM Distance: >3 FB Neck ROM: Full    Dental  (+) Chipped, Dental Advisory Given   Pulmonary COPD COPD inhaler,  breath sounds clear to auscultation  Pulmonary exam normal       Cardiovascular hypertension, Pt. on medications and Pt. on home beta blockers - angina+ Valvular Problems/Murmurs (mod AS) AS Rhythm:Regular Rate:Normal  3/15 ECHO: EF 55-60%, grade 1 diastolic dysfunction, mod AS   Neuro/Psych negative neurological ROS     GI/Hepatic Neg liver ROS, GERD-  Medicated and Controlled,  Endo/Other  diabetes (glu 245), Insulin Dependent, Oral Hypoglycemic AgentsMorbid obesity  Renal/GU ESRF and DialysisRenal disease (K+ 4.4)     Musculoskeletal   Abdominal (+) + obese,   Peds  Hematology   Anesthesia Other Findings   Reproductive/Obstetrics                         Anesthesia Physical Anesthesia Plan  ASA: III  Anesthesia Plan: MAC   Post-op Pain Management:    Induction: Intravenous  Airway Management Planned: Simple Face Mask and Natural Airway  Additional Equipment:   Intra-op Plan:   Post-operative Plan:   Informed Consent: I have reviewed the patients History and Physical, chart, labs and discussed the procedure including the risks, benefits and alternatives for the proposed anesthesia with the patient or authorized representative who has indicated his/her understanding and acceptance.   Dental advisory given  Plan Discussed with: CRNA and Surgeon  Anesthesia Plan Comments: (Plan routine monitors, MAC)        Anesthesia  Quick Evaluation

## 2013-12-23 LAB — CATH TIP CULTURE: CULTURE: NO GROWTH

## 2013-12-25 ENCOUNTER — Encounter (HOSPITAL_COMMUNITY): Payer: Self-pay | Admitting: Vascular Surgery

## 2013-12-27 ENCOUNTER — Other Ambulatory Visit (HOSPITAL_COMMUNITY): Payer: PRIVATE HEALTH INSURANCE

## 2013-12-27 ENCOUNTER — Ambulatory Visit: Payer: PRIVATE HEALTH INSURANCE | Admitting: Vascular Surgery

## 2013-12-27 ENCOUNTER — Other Ambulatory Visit: Payer: Self-pay | Admitting: *Deleted

## 2013-12-27 DIAGNOSIS — Z4931 Encounter for adequacy testing for hemodialysis: Secondary | ICD-10-CM

## 2013-12-27 DIAGNOSIS — N186 End stage renal disease: Secondary | ICD-10-CM

## 2014-01-08 ENCOUNTER — Ambulatory Visit (HOSPITAL_COMMUNITY)
Admission: RE | Admit: 2014-01-08 | Discharge: 2014-01-08 | Disposition: A | Discharge status: Home / Self Care | Payer: Medicaid Other | Source: Ambulatory Visit | Attending: Vascular Surgery | Admitting: Vascular Surgery

## 2014-01-08 DIAGNOSIS — Z4931 Encounter for adequacy testing for hemodialysis: Secondary | ICD-10-CM | POA: Diagnosis not present

## 2014-01-08 DIAGNOSIS — N186 End stage renal disease: Secondary | ICD-10-CM | POA: Insufficient documentation

## 2014-01-09 ENCOUNTER — Encounter: Payer: Self-pay | Admitting: Vascular Surgery

## 2014-01-10 ENCOUNTER — Ambulatory Visit (INDEPENDENT_AMBULATORY_CARE_PROVIDER_SITE_OTHER): Payer: Medicaid Other | Admitting: Vascular Surgery

## 2014-01-10 ENCOUNTER — Encounter: Payer: Self-pay | Admitting: *Deleted

## 2014-01-10 ENCOUNTER — Encounter: Payer: Self-pay | Admitting: Vascular Surgery

## 2014-01-10 VITALS — BP 159/59 | HR 69 | Ht 61.0 in | Wt 190.9 lb

## 2014-01-10 DIAGNOSIS — N186 End stage renal disease: Secondary | ICD-10-CM

## 2014-01-10 DIAGNOSIS — Z992 Dependence on renal dialysis: Secondary | ICD-10-CM

## 2014-01-10 NOTE — Progress Notes (Addendum)
[facility] [place]  Patient name: [name] MRN: [id number] DOB: 12-19-1960 Sex: female  REASON FOR VISIT: Follow up of right brachiocephalic AV fistula  HPI: [name] is a 53 y.o. female who had a brachiocephalic fistula placed on 07/18/2013. She was last seen on 10/04/2013 at which point the fistula appeared to be functioning well. In reviewing her records, it looks like she had a tunneled dialysis catheter placed by Dr. Arbie Cookey on 12/22/2013.  She tells me that they have been having a difficult time cannulating her fistula. It is easy to cannulate near the antecubital level, however it they're having a hard time with the second needle in the mid upper arm. This reason at this point there simply using the catheter and had been using this for the last 2 weeks.  She was in our office on 01/08/2014 and had a duplex of the fistula. This showed that the diameters of the fistula range from 0.76 cm-1.2 cm. The depths ranged from 0.35-0.96 and the proximal arm. It was really only in the distal aspect of the fistula in the upper all arm that the vein became deeper.  She dialyzes on Tuesdays Thursdays and Saturdays.  Past Medical History  Diagnosis Date  . Hypertension   . Diabetes mellitus   . Anemia    No family history on file. SOCIAL HISTORY: History  Substance Use Topics  . Smoking status: Never Smoker   . Smokeless tobacco: Not on file  . Alcohol Use: No   Allergies  Allergen Reactions  . Codeine Other (See Comments)    hallucinations   Current Outpatient Prescriptions  Medication Sig Dispense Refill  . albuterol (PROVENTIL) (2.5 MG/3ML) 0.083% nebulizer solution Take 2.5 mg by nebulization every 6 (six) hours as needed for wheezing or shortness of breath.      Marland Kitchen atenolol (TENORMIN) 25 MG tablet Take 1 tablet (25 mg total) by mouth at bedtime.  30 tablet  2  . calcium carbonate (TUMS - DOSED IN MG ELEMENTAL CALCIUM) 500 MG chewable  tablet Chew 1 tablet by mouth 3 (three) times daily.      . clonazePAM (KLONOPIN) 0.5 MG tablet Take 0.5 mg by mouth at bedtime.      . darbepoetin (ARANESP) 60 MCG/0.3ML SOLN injection Inject 0.3 mLs (60 mcg total) into the skin every Saturday at 6 PM.  4.2 mL  0  . doxercalciferol (HECTOROL) 4 MCG/2ML injection Inject 2 mLs (4 mcg total) into the vein Every Tuesday,Thursday,and Saturday with dialysis.  2 mL  0  . ferric gluconate 125 mg in sodium chloride 0.9 % 100 mL Inject 125 mg into the vein Every Tuesday,Thursday,and Saturday with dialysis.  125 ampule  0  . insulin aspart (NOVOLOG) 100 UNIT/ML injection Inject 0-10 Units into the skin 4 (four) times daily - after meals and at bedtime. Per sliding scale.      . insulin glargine (LANTUS) 100 UNIT/ML injection Inject 0.4 mLs (40 Units total) into the skin at bedtime.  10 mL  11  . lanthanum (FOSRENOL) 1000 MG chewable tablet Chew 1,000 mg by mouth 3 (three) times daily with meals.      Marland Kitchen linagliptin (TRADJENTA) 5 MG TABS tablet Take 1 tablet (5 mg total) by mouth daily.  30 tablet  5  . multivitamin (RENA-VIT) TABS tablet Take 1 tablet by mouth at bedtime.  30 tablet  5  . pantoprazole (PROTONIX) 40 MG tablet Take 1 tablet (40 mg total)  by mouth daily.  30 tablet  5  . promethazine (PHENERGAN) 25 MG tablet Take 1 tablet (25 mg total) by mouth every 6 (six) hours as needed for nausea or vomiting.  30 tablet  0  . zaleplon (SONATA) 10 MG capsule Take 10 mg by mouth at bedtime as needed for sleep.       No current facility-administered medications for this visit.   REVIEW OF SYSTEMS: Arly.Keller[X ] denotes positive finding; [  ] denotes negative finding  CARDIOVASCULAR:  [ ]  chest pain   [ ]  chest pressure   [ ]  palpitations   [ ]  orthopnea   [ ]  dyspnea on exertion   [ ]  claudication   [ ]  rest pain   [ ]  DVT   [ ]  phlebitis PULMONARY:   [ ]  productive cough   [ ]  asthma   [ ]  wheezing NEUROLOGIC:   [ ]  weakness  [ ]  paresthesias  [ ]  aphasia  [ ]   amaurosis  [ ]  dizziness HEMATOLOGIC:   [ ]  bleeding problems   [ ]  clotting disorders MUSCULOSKELETAL:  [ ]  joint pain   [ ]  joint swelling [ ]  leg swelling GASTROINTESTINAL: [ ]   blood in stool  [ ]   hematemesis GENITOURINARY:  [ ]   dysuria  [ ]   hematuria PSYCHIATRIC:  [ ]  history of major depression INTEGUMENTARY:  [ ]  rashes  [ ]  ulcers CONSTITUTIONAL:  [ ]  fever   [ ]  chills  PHYSICAL EXAM: Filed Vitals:   01/10/14 1630  BP: 159/59  Pulse: 69  Height: 5\' 1"  (1.549 m)  Weight: 190 lb 14.4 oz (86.592 kg)  SpO2: 100%   Body mass index is 36.09 kg/(m^2). GENERAL: The patient is a well-nourished female, in no acute distress. The vital signs are documented above. CARDIOVASCULAR: There is a regular rate and rhythm.  PULMONARY: There is good air exchange bilaterally without wheezing or rales. She has a palpable right radial pulse. She has an excellent thrill in her right upper arm fistula.  DATA:  The results of her duplex are described above.  MEDICAL ISSUES:  ESRD on dialysis On exam the fistula appears to have excellent thrill and I'm not sure why they're having a difficult time with access. The vein only appears to be deep in the upper arm. I have recommended we proceed with a fistulogram. If there any correctable problems are identified we can address these. If there do not appear to be any problems with the fistula then I would recommend superficialization of the fistula. I would be very reluctant to give up on the fistula given that it has an excellent thrill.    Her fistulogram is scheduled for 01/15/2014.  Kory Panjwani S [facility] [place] [facility]: [phone]

## 2014-01-10 NOTE — Assessment & Plan Note (Signed)
On exam the fistula appears to have excellent thrill and I'm not sure why they're having a difficult time with access. The vein only appears to be deep in the upper arm. I have recommended we proceed with a fistulogram. If there any correctable problems are identified we can address these. If there do not appear to be any problems with the fistula then I would recommend superficialization of the fistula. I would be very reluctant to give up on the fistula given that it has an excellent thrill.

## 2014-01-11 ENCOUNTER — Other Ambulatory Visit: Payer: Self-pay | Admitting: *Deleted

## 2014-01-14 MED ORDER — SODIUM CHLORIDE 0.9 % IJ SOLN
3.0000 mL | INTRAMUSCULAR | Status: DC | PRN
Start: 2014-01-14 — End: 2014-01-15

## 2014-01-14 MED ORDER — SODIUM CHLORIDE 0.9 % IV SOLN: 250.0000 mL | INTRAVENOUS | Status: DC | PRN

## 2014-01-14 MED ORDER — SODIUM CHLORIDE 0.9 % IJ SOLN: 3.0000 mL | Freq: Two times a day (BID) | INTRAMUSCULAR | Status: DC

## 2014-01-15 ENCOUNTER — Telehealth: Payer: Self-pay

## 2014-01-15 ENCOUNTER — Encounter (HOSPITAL_COMMUNITY)
Admission: RE | Disposition: A | Discharge status: Home / Self Care | Payer: Self-pay | Source: Ambulatory Visit | Attending: Vascular Surgery

## 2014-01-15 ENCOUNTER — Ambulatory Visit (HOSPITAL_COMMUNITY)
Admission: RE | Admit: 2014-01-15 | Discharge: 2014-01-15 | Disposition: A | Discharge status: Home / Self Care | Payer: Medicaid Other | Source: Ambulatory Visit | Attending: Vascular Surgery | Admitting: Vascular Surgery

## 2014-01-15 DIAGNOSIS — E119 Type 2 diabetes mellitus without complications: Secondary | ICD-10-CM | POA: Insufficient documentation

## 2014-01-15 DIAGNOSIS — N186 End stage renal disease: Secondary | ICD-10-CM | POA: Insufficient documentation

## 2014-01-15 DIAGNOSIS — Z794 Long term (current) use of insulin: Secondary | ICD-10-CM | POA: Diagnosis not present

## 2014-01-15 DIAGNOSIS — T82898A Other specified complication of vascular prosthetic devices, implants and grafts, initial encounter: Secondary | ICD-10-CM | POA: Diagnosis not present

## 2014-01-15 DIAGNOSIS — Z992 Dependence on renal dialysis: Secondary | ICD-10-CM | POA: Diagnosis not present

## 2014-01-15 DIAGNOSIS — I120 Hypertensive chronic kidney disease with stage 5 chronic kidney disease or end stage renal disease: Secondary | ICD-10-CM | POA: Insufficient documentation

## 2014-01-15 DIAGNOSIS — D649 Anemia, unspecified: Secondary | ICD-10-CM | POA: Insufficient documentation

## 2014-01-15 DIAGNOSIS — Z79899 Other long term (current) drug therapy: Secondary | ICD-10-CM | POA: Diagnosis not present

## 2014-01-15 HISTORY — PX: FISTULOGRAM: SHX5832

## 2014-01-15 LAB — POCT I-STAT, CHEM 8
BUN: 47 mg/dL — ABNORMAL HIGH (ref 6–23)
CREATININE: 6.8 mg/dL — AB (ref 0.50–1.10)
Calcium, Ion: 1.17 mmol/L (ref 1.12–1.23)
Chloride: 92 mEq/L — ABNORMAL LOW (ref 96–112)
Glucose, Bld: 342 mg/dL — ABNORMAL HIGH (ref 70–99)
HCT: 39 % (ref 36.0–46.0)
HEMOGLOBIN: 13.3 g/dL (ref 12.0–15.0)
Potassium: 4.6 mEq/L (ref 3.7–5.3)
SODIUM: 132 meq/L — AB (ref 137–147)
TCO2: 29 mmol/L (ref 0–100)

## 2014-01-15 LAB — GLUCOSE, CAPILLARY: GLUCOSE-CAPILLARY: 304 mg/dL — AB (ref 70–99)

## 2014-01-15 SURGERY — FISTULOGRAM
Anesthesia: LOCAL

## 2014-01-15 MED ORDER — ONDANSETRON HCL 4 MG/2ML IJ SOLN
INTRAMUSCULAR | Status: AC
  Filled 2014-01-15: qty 2

## 2014-01-15 MED ORDER — HEPARIN (PORCINE) IN NACL 2-0.9 UNIT/ML-% IJ SOLN
INTRAMUSCULAR | Status: AC
  Filled 2014-01-15: qty 500

## 2014-01-15 MED ORDER — LIDOCAINE HCL (PF) 1 % IJ SOLN
INTRAMUSCULAR | Status: AC
  Filled 2014-01-15: qty 30

## 2014-01-15 NOTE — Discharge Instructions (Signed)
Fistulogram, Care After °Refer to this sheet in the next few weeks. These instructions provide you with information on caring for yourself after your procedure. Your health care provider may also give you more specific instructions. Your treatment has been planned according to current medical practices, but problems sometimes occur. Call your health care provider if you have any problems or questions after your procedure. °WHAT TO EXPECT AFTER THE PROCEDURE °After your procedure, it is typical to have the following: °· A small amount of discomfort in the area where the catheters were placed. °· A small amount of bruising around the fistula. °· Sleepiness and fatigue. °HOME CARE INSTRUCTIONS °· Rest at home for the day following your procedure. °· Do not drive or operate heavy machinery while taking pain medicine. °· Take medicines only as directed by your health care provider. °· Do not take baths, swim, or use a hot tub until your health care provider approves. You may shower 24 hours after the procedure or as directed by your health care provider. °· There are many different ways to close and cover an incision, including stitches, skin glue, and adhesive strips. Follow your health care provider's instructions on: °¨ Incision care. °¨ Bandage (dressing) changes and removal. °¨ Incision closure removal. °· Monitor your dialysis fistula carefully. °SEEK MEDICAL CARE IF: °· You have drainage, redness, swelling, or pain at your catheter site. °· You have a fever. °· You have chills. °SEEK IMMEDIATE MEDICAL CARE IF: °· You feel weak. °· You have trouble balancing. °· You have trouble moving your arms or legs. °· You have problems with your speech or vision. °· You can no longer feel a vibration or buzz when you put your fingers over your dialysis fistula. °· The limb that was used for the procedure: °¨ Swells. °¨ Is painful. °¨ Is cold. °¨ Is discolored, such as blue or pale white. °Document Released: 08/14/2013  Document Reviewed: 05/19/2013 °[employer]® Patient Information ©2015 [employer]. This information is not intended to replace advice given to you by your health care provider. Make sure you discuss any questions you have with your health care provider. ° °

## 2014-01-15 NOTE — Telephone Encounter (Signed)
Per recommendation Dr. Edilia Boickson, pt. will be scheduled for Superficialization of Right B-C AVF 01/26/14.

## 2014-01-15 NOTE — Interval H&P Note (Signed)
History and Physical Interval Note:  01/15/2014 7:26 AM  [name]  has presented today for surgery, with the diagnosis of instage renal  The various methods of treatment have been discussed with the patient and family. After consideration of risks, benefits and other options for treatment, the patient has consented to  Procedure(s): FISTULOGRAM (N/A) as a surgical intervention .  The patient's history has been reviewed, patient examined, no change in status, stable for surgery.  I have reviewed the patient's chart and labs.  Questions were answered to the patient's satisfaction.     [name] S

## 2014-01-15 NOTE — H&P (View-Only) (Signed)
[facility] [place]  Patient name: [name] MRN: [id number] DOB: 12-19-1960 Sex: female  REASON FOR VISIT: Follow up of right brachiocephalic AV fistula  HPI: [name] is a 53 y.o. female who had a brachiocephalic fistula placed on 07/18/2013. She was last seen on 10/04/2013 at which point the fistula appeared to be functioning well. In reviewing her records, it looks like she had a tunneled dialysis catheter placed by Dr. Arbie Cookey on 12/22/2013.  She tells me that they have been having a difficult time cannulating her fistula. It is easy to cannulate near the antecubital level, however it they're having a hard time with the second needle in the mid upper arm. This reason at this point there simply using the catheter and had been using this for the last 2 weeks.  She was in our office on 01/08/2014 and had a duplex of the fistula. This showed that the diameters of the fistula range from 0.76 cm-1.2 cm. The depths ranged from 0.35-0.96 and the proximal arm. It was really only in the distal aspect of the fistula in the upper all arm that the vein became deeper.  She dialyzes on Tuesdays Thursdays and Saturdays.  Past Medical History  Diagnosis Date  . Hypertension   . Diabetes mellitus   . Anemia    No family history on file. SOCIAL HISTORY: History  Substance Use Topics  . Smoking status: Never Smoker   . Smokeless tobacco: Not on file  . Alcohol Use: No   Allergies  Allergen Reactions  . Codeine Other (See Comments)    hallucinations   Current Outpatient Prescriptions  Medication Sig Dispense Refill  . albuterol (PROVENTIL) (2.5 MG/3ML) 0.083% nebulizer solution Take 2.5 mg by nebulization every 6 (six) hours as needed for wheezing or shortness of breath.      Marland Kitchen atenolol (TENORMIN) 25 MG tablet Take 1 tablet (25 mg total) by mouth at bedtime.  30 tablet  2  . calcium carbonate (TUMS - DOSED IN MG ELEMENTAL CALCIUM) 500 MG chewable  tablet Chew 1 tablet by mouth 3 (three) times daily.      . clonazePAM (KLONOPIN) 0.5 MG tablet Take 0.5 mg by mouth at bedtime.      . darbepoetin (ARANESP) 60 MCG/0.3ML SOLN injection Inject 0.3 mLs (60 mcg total) into the skin every Saturday at 6 PM.  4.2 mL  0  . doxercalciferol (HECTOROL) 4 MCG/2ML injection Inject 2 mLs (4 mcg total) into the vein Every Tuesday,Thursday,and Saturday with dialysis.  2 mL  0  . ferric gluconate 125 mg in sodium chloride 0.9 % 100 mL Inject 125 mg into the vein Every Tuesday,Thursday,and Saturday with dialysis.  125 ampule  0  . insulin aspart (NOVOLOG) 100 UNIT/ML injection Inject 0-10 Units into the skin 4 (four) times daily - after meals and at bedtime. Per sliding scale.      . insulin glargine (LANTUS) 100 UNIT/ML injection Inject 0.4 mLs (40 Units total) into the skin at bedtime.  10 mL  11  . lanthanum (FOSRENOL) 1000 MG chewable tablet Chew 1,000 mg by mouth 3 (three) times daily with meals.      Marland Kitchen linagliptin (TRADJENTA) 5 MG TABS tablet Take 1 tablet (5 mg total) by mouth daily.  30 tablet  5  . multivitamin (RENA-VIT) TABS tablet Take 1 tablet by mouth at bedtime.  30 tablet  5  . pantoprazole (PROTONIX) 40 MG tablet Take 1 tablet (40 mg total)  by mouth daily.  30 tablet  5  . promethazine (PHENERGAN) 25 MG tablet Take 1 tablet (25 mg total) by mouth every 6 (six) hours as needed for nausea or vomiting.  30 tablet  0  . zaleplon (SONATA) 10 MG capsule Take 10 mg by mouth at bedtime as needed for sleep.       No current facility-administered medications for this visit.   REVIEW OF SYSTEMS: Arly.Keller[X ] denotes positive finding; [  ] denotes negative finding  CARDIOVASCULAR:  [ ]  chest pain   [ ]  chest pressure   [ ]  palpitations   [ ]  orthopnea   [ ]  dyspnea on exertion   [ ]  claudication   [ ]  rest pain   [ ]  DVT   [ ]  phlebitis PULMONARY:   [ ]  productive cough   [ ]  asthma   [ ]  wheezing NEUROLOGIC:   [ ]  weakness  [ ]  paresthesias  [ ]  aphasia  [ ]   amaurosis  [ ]  dizziness HEMATOLOGIC:   [ ]  bleeding problems   [ ]  clotting disorders MUSCULOSKELETAL:  [ ]  joint pain   [ ]  joint swelling [ ]  leg swelling GASTROINTESTINAL: [ ]   blood in stool  [ ]   hematemesis GENITOURINARY:  [ ]   dysuria  [ ]   hematuria PSYCHIATRIC:  [ ]  history of major depression INTEGUMENTARY:  [ ]  rashes  [ ]  ulcers CONSTITUTIONAL:  [ ]  fever   [ ]  chills  PHYSICAL EXAM: Filed Vitals:   01/10/14 1630  BP: 159/59  Pulse: 69  Height: 5\' 1"  (1.549 m)  Weight: 190 lb 14.4 oz (86.592 kg)  SpO2: 100%   Body mass index is 36.09 kg/(m^2). GENERAL: The patient is a well-nourished female, in no acute distress. The vital signs are documented above. CARDIOVASCULAR: There is a regular rate and rhythm.  PULMONARY: There is good air exchange bilaterally without wheezing or rales. She has a palpable right radial pulse. She has an excellent thrill in her right upper arm fistula.  DATA:  The results of her duplex are described above.  MEDICAL ISSUES:  ESRD on dialysis On exam the fistula appears to have excellent thrill and I'm not sure why they're having a difficult time with access. The vein only appears to be deep in the upper arm. I have recommended we proceed with a fistulogram. If there any correctable problems are identified we can address these. If there do not appear to be any problems with the fistula then I would recommend superficialization of the fistula. I would be very reluctant to give up on the fistula given that it has an excellent thrill.    Her fistulogram is scheduled for 01/15/2014.  DICKSON,CHRISTOPHER S [facility] [place] [facility]: [phone]

## 2014-01-15 NOTE — Op Note (Signed)
   PATIENT: [name]  MRN: [id number] DOB: 05/31/1960    DATE OF PROCEDURE: 01/15/2014  INDICATIONS: Katherine EvaKimberly J Liberto is a 53 y.o. female who has a right brachiocephalic AV fistula. This has been difficult to access. She presents for a fistulogram.  PROCEDURE:  1. Ultrasound-guided access to right brachiocephalic AV fistula 2. fistulogram right brachiocephalic AV fistula  SURGEON: Di Kindlehristopher S. Edilia Boickson, MD, FACS  ANESTHESIA: local    EBL: minimal  TECHNIQUE: The patient was taken to the peripheral vascular. The right upper extremity was prepped and draped in usual sterile fashion. Under ultrasound guidance, after the skin was anesthetized, the proximal fistula was cannulated with a micropuncture needle and a micropuncture sheath introduced over a wire. A fistulogram was then obtained evaluating the fistula from the cannulation 0.2 the central venous system. Pressure was then held over the fistula beyond the cannulation site and reflux into the arterial anastomosis was performed. At the completion of 4-0 Monocryl suture was placed around the cannulation site and was good hemostasis.   FINDINGS:  1. No evidence of central venous stenosis. 2. Widely patent right brachiocephalic AV fistula. 3. No significant competing branches.  CLINICAL NOTE: The patient will be scheduled for superficialization of her right brachiocephalic fistula for 01/26/2014.  Waverly Ferrarihristopher Jacksyn Beeks, MD, FACS [facility]  DATE OF DICTATION:   01/15/2014

## 2014-01-26 ENCOUNTER — Encounter (HOSPITAL_COMMUNITY): Payer: Self-pay

## 2014-01-30 ENCOUNTER — Other Ambulatory Visit: Payer: Self-pay

## 2014-02-08 ENCOUNTER — Encounter (HOSPITAL_COMMUNITY): Payer: Self-pay | Admitting: *Deleted

## 2014-02-08 MED ORDER — SODIUM CHLORIDE 0.9 % IV SOLN: INTRAVENOUS | Status: DC

## 2014-02-08 MED ORDER — DEXTROSE 5 % IV SOLN
1.5000 g | INTRAVENOUS | Status: AC
  Administered 2014-02-16: 1.5 g via INTRAVENOUS
  Filled 2014-02-08: qty 1.5

## 2014-02-15 NOTE — Progress Notes (Signed)
Pt contacted to update with new arrival time for DOS. Pt stated that she was already instructed by the nurse from the surgeons office to arrive at 7:30 AM.

## 2014-02-16 ENCOUNTER — Other Ambulatory Visit: Payer: Self-pay

## 2014-02-16 ENCOUNTER — Encounter (HOSPITAL_COMMUNITY): Payer: Self-pay | Admitting: *Deleted

## 2014-02-16 ENCOUNTER — Ambulatory Visit (HOSPITAL_COMMUNITY)
Admission: RE | Admit: 2014-02-16 | Discharge: 2014-02-16 | Disposition: A | Discharge status: Home / Self Care | Payer: Medicaid Other | Source: Ambulatory Visit | Attending: Vascular Surgery | Admitting: Vascular Surgery

## 2014-02-16 ENCOUNTER — Encounter (HOSPITAL_COMMUNITY)
Admission: RE | Disposition: A | Discharge status: Home / Self Care | Payer: Self-pay | Source: Ambulatory Visit | Attending: Vascular Surgery

## 2014-02-16 ENCOUNTER — Ambulatory Visit (HOSPITAL_COMMUNITY): Payer: Medicaid Other | Admitting: Anesthesiology

## 2014-02-16 ENCOUNTER — Telehealth: Payer: Self-pay | Admitting: Vascular Surgery

## 2014-02-16 DIAGNOSIS — Z79899 Other long term (current) drug therapy: Secondary | ICD-10-CM | POA: Diagnosis not present

## 2014-02-16 DIAGNOSIS — Y832 Surgical operation with anastomosis, bypass or graft as the cause of abnormal reaction of the patient, or of later complication, without mention of misadventure at the time of the procedure: Secondary | ICD-10-CM | POA: Diagnosis not present

## 2014-02-16 DIAGNOSIS — Z992 Dependence on renal dialysis: Secondary | ICD-10-CM | POA: Diagnosis not present

## 2014-02-16 DIAGNOSIS — Z794 Long term (current) use of insulin: Secondary | ICD-10-CM | POA: Diagnosis not present

## 2014-02-16 DIAGNOSIS — T82898A Other specified complication of vascular prosthetic devices, implants and grafts, initial encounter: Secondary | ICD-10-CM | POA: Insufficient documentation

## 2014-02-16 DIAGNOSIS — E119 Type 2 diabetes mellitus without complications: Secondary | ICD-10-CM | POA: Diagnosis not present

## 2014-02-16 DIAGNOSIS — Z885 Allergy status to narcotic agent status: Secondary | ICD-10-CM | POA: Insufficient documentation

## 2014-02-16 DIAGNOSIS — N186 End stage renal disease: Secondary | ICD-10-CM | POA: Insufficient documentation

## 2014-02-16 DIAGNOSIS — D649 Anemia, unspecified: Secondary | ICD-10-CM | POA: Diagnosis not present

## 2014-02-16 DIAGNOSIS — I120 Hypertensive chronic kidney disease with stage 5 chronic kidney disease or end stage renal disease: Secondary | ICD-10-CM | POA: Insufficient documentation

## 2014-02-16 DIAGNOSIS — Y92239 Unspecified place in hospital as the place of occurrence of the external cause: Secondary | ICD-10-CM | POA: Diagnosis not present

## 2014-02-16 DIAGNOSIS — T82898D Other specified complication of vascular prosthetic devices, implants and grafts, subsequent encounter: Secondary | ICD-10-CM

## 2014-02-16 DIAGNOSIS — Z4931 Encounter for adequacy testing for hemodialysis: Secondary | ICD-10-CM

## 2014-02-16 HISTORY — DX: Other complications of anesthesia, initial encounter: T88.59XA

## 2014-02-16 HISTORY — DX: Gastro-esophageal reflux disease without esophagitis: K21.9

## 2014-02-16 HISTORY — DX: Chronic kidney disease, unspecified: N18.9

## 2014-02-16 HISTORY — DX: Adverse effect of unspecified anesthetic, initial encounter: T41.45XA

## 2014-02-16 HISTORY — PX: FISTULA SUPERFICIALIZATION: SHX6341

## 2014-02-16 LAB — POCT I-STAT 4, (NA,K, GLUC, HGB,HCT)
Glucose, Bld: 282 mg/dL — ABNORMAL HIGH (ref 70–99)
HCT: 39 % (ref 36.0–46.0)
Hemoglobin: 13.3 g/dL (ref 12.0–15.0)
Potassium: 4 mEq/L (ref 3.7–5.3)
Sodium: 133 mEq/L — ABNORMAL LOW (ref 137–147)

## 2014-02-16 LAB — GLUCOSE, CAPILLARY
Glucose-Capillary: 253 mg/dL — ABNORMAL HIGH (ref 70–99)
Glucose-Capillary: 305 mg/dL — ABNORMAL HIGH (ref 70–99)

## 2014-02-16 SURGERY — FISTULA SUPERFICIALIZATION
Anesthesia: Monitor Anesthesia Care | Site: Arm Upper | Laterality: Right

## 2014-02-16 MED ORDER — FENTANYL CITRATE 0.05 MG/ML IJ SOLN
INTRAMUSCULAR | Status: AC
  Filled 2014-02-16: qty 2

## 2014-02-16 MED ORDER — MIDAZOLAM HCL 2 MG/2ML IJ SOLN
INTRAMUSCULAR | Status: AC
  Filled 2014-02-16: qty 2

## 2014-02-16 MED ORDER — DEXAMETHASONE SODIUM PHOSPHATE 4 MG/ML IJ SOLN
INTRAMUSCULAR | Status: DC | PRN
  Administered 2014-02-16 (×2): 2 mg via INTRAVENOUS

## 2014-02-16 MED ORDER — LIDOCAINE HCL (PF) 1 % IJ SOLN
INTRAMUSCULAR | Status: AC
  Filled 2014-02-16: qty 30

## 2014-02-16 MED ORDER — OXYCODONE HCL 5 MG PO TABS
ORAL_TABLET | ORAL | Status: AC
  Administered 2014-02-16: 5 mg via ORAL
  Filled 2014-02-16: qty 1

## 2014-02-16 MED ORDER — ARTIFICIAL TEARS OP OINT
TOPICAL_OINTMENT | OPHTHALMIC | Status: AC
  Filled 2014-02-16: qty 3.5

## 2014-02-16 MED ORDER — OXYCODONE HCL 5 MG/5ML PO SOLN
ORAL | Status: AC
  Filled 2014-02-16: qty 5

## 2014-02-16 MED ORDER — FENTANYL CITRATE 0.05 MG/ML IJ SOLN
INTRAMUSCULAR | Status: DC | PRN
  Administered 2014-02-16: 25 ug via INTRAVENOUS
  Administered 2014-02-16: 50 ug via INTRAVENOUS
  Administered 2014-02-16: 25 ug via INTRAVENOUS

## 2014-02-16 MED ORDER — THROMBIN 20000 UNITS EX SOLR
CUTANEOUS | Status: AC
  Filled 2014-02-16: qty 20000

## 2014-02-16 MED ORDER — LIDOCAINE-EPINEPHRINE (PF) 1 %-1:200000 IJ SOLN
INTRAMUSCULAR | Status: DC | PRN
  Administered 2014-02-16: 23 mL

## 2014-02-16 MED ORDER — FENTANYL CITRATE 0.05 MG/ML IJ SOLN
25.0000 ug | INTRAMUSCULAR | Status: DC | PRN
  Administered 2014-02-16: 25 ug via INTRAVENOUS

## 2014-02-16 MED ORDER — OXYCODONE HCL 5 MG PO TABS
5.0000 mg | ORAL_TABLET | Freq: Once | ORAL | Status: AC | PRN
Start: 2014-02-16 — End: 2014-02-16
  Administered 2014-02-16: 5 mg via ORAL

## 2014-02-16 MED ORDER — OXYCODONE HCL 5 MG/5ML PO SOLN: 5.0000 mg | Freq: Once | ORAL | Status: AC | PRN

## 2014-02-16 MED ORDER — PROPOFOL INFUSION 10 MG/ML OPTIME
INTRAVENOUS | Status: DC | PRN
  Administered 2014-02-16: 50 ug/kg/min via INTRAVENOUS

## 2014-02-16 MED ORDER — ONDANSETRON HCL 4 MG/2ML IJ SOLN
INTRAMUSCULAR | Status: DC | PRN
  Administered 2014-02-16: 4 mg via INTRAVENOUS

## 2014-02-16 MED ORDER — PROPOFOL 10 MG/ML IV BOLUS
INTRAVENOUS | Status: AC
  Filled 2014-02-16: qty 20

## 2014-02-16 MED ORDER — LIDOCAINE HCL (PF) 1 % IJ SOLN
INTRAMUSCULAR | Status: DC | PRN
  Administered 2014-02-16: 30 mL

## 2014-02-16 MED ORDER — PROMETHAZINE HCL 25 MG/ML IJ SOLN: 6.2500 mg | INTRAMUSCULAR | Status: DC | PRN

## 2014-02-16 MED ORDER — OXYCODONE-ACETAMINOPHEN 5-325 MG PO TABS: 1.0000 | ORAL_TABLET | ORAL | Status: DC | PRN

## 2014-02-16 MED ORDER — LIDOCAINE-EPINEPHRINE (PF) 1 %-1:200000 IJ SOLN
INTRAMUSCULAR | Status: AC
  Filled 2014-02-16: qty 10

## 2014-02-16 MED ORDER — SODIUM CHLORIDE 0.9 % IR SOLN
Status: DC | PRN
  Administered 2014-02-16: 500 mL

## 2014-02-16 MED ORDER — 0.9 % SODIUM CHLORIDE (POUR BTL) OPTIME
TOPICAL | Status: DC | PRN
  Administered 2014-02-16: 1000 mL

## 2014-02-16 MED ORDER — PROPOFOL 10 MG/ML IV BOLUS
INTRAVENOUS | Status: DC | PRN
  Administered 2014-02-16: 30 mg via INTRAVENOUS

## 2014-02-16 MED ORDER — MEPERIDINE HCL 25 MG/ML IJ SOLN: 6.2500 mg | INTRAMUSCULAR | Status: DC | PRN

## 2014-02-16 MED ORDER — FENTANYL CITRATE 0.05 MG/ML IJ SOLN
INTRAMUSCULAR | Status: AC
  Filled 2014-02-16: qty 5

## 2014-02-16 MED ORDER — ONDANSETRON HCL 4 MG/2ML IJ SOLN
INTRAMUSCULAR | Status: AC
  Filled 2014-02-16: qty 2

## 2014-02-16 MED ORDER — CHLORHEXIDINE GLUCONATE CLOTH 2 % EX PADS: 6.0000 | MEDICATED_PAD | Freq: Once | CUTANEOUS | Status: DC

## 2014-02-16 MED ORDER — MIDAZOLAM HCL 2 MG/2ML IJ SOLN: 0.5000 mg | Freq: Once | INTRAMUSCULAR | Status: DC | PRN

## 2014-02-16 MED ORDER — SODIUM CHLORIDE 0.9 % IV SOLN
INTRAVENOUS | Status: DC
  Administered 2014-02-16: 09:00:00 via INTRAVENOUS

## 2014-02-16 MED ORDER — MIDAZOLAM HCL 5 MG/5ML IJ SOLN
INTRAMUSCULAR | Status: DC | PRN
  Administered 2014-02-16 (×2): 1 mg via INTRAVENOUS
  Administered 2014-02-16: 2 mg via INTRAVENOUS

## 2014-02-16 SURGICAL SUPPLY — 38 items
ADH SKN CLS APL DERMABOND .7 (GAUZE/BANDAGES/DRESSINGS) ×1
CANISTER SUCTION 2500CC (MISCELLANEOUS) ×3 IMPLANT
CLIP TI MEDIUM 6 (CLIP) ×3 IMPLANT
CLIP TI WIDE RED SMALL 6 (CLIP) ×3 IMPLANT
COVER PROBE W GEL 5X96 (DRAPES) ×3 IMPLANT
COVER SURGICAL LIGHT HANDLE (MISCELLANEOUS) ×3 IMPLANT
DECANTER SPIKE VIAL GLASS SM (MISCELLANEOUS) ×3 IMPLANT
DERMABOND ADVANCED (GAUZE/BANDAGES/DRESSINGS) ×2
DERMABOND ADVANCED .7 DNX12 (GAUZE/BANDAGES/DRESSINGS) ×1 IMPLANT
DRAIN PENROSE 1/2X12 LTX STRL (WOUND CARE) IMPLANT
ELECT REM PT RETURN 9FT ADLT (ELECTROSURGICAL) ×3
ELECTRODE REM PT RTRN 9FT ADLT (ELECTROSURGICAL) ×1 IMPLANT
GAUZE SPONGE 4X4 12PLY STRL (GAUZE/BANDAGES/DRESSINGS) ×3 IMPLANT
GLOVE BIO SURGEON STRL SZ7.5 (GLOVE) ×5 IMPLANT
GLOVE BIOGEL PI IND STRL 6.5 (GLOVE) IMPLANT
GLOVE BIOGEL PI IND STRL 8 (GLOVE) ×1 IMPLANT
GLOVE BIOGEL PI INDICATOR 6.5 (GLOVE) ×6
GLOVE BIOGEL PI INDICATOR 8 (GLOVE) ×4
GLOVE ECLIPSE 6.5 STRL STRAW (GLOVE) ×2 IMPLANT
GLOVE SKINSENSE NS SZ7.0 (GLOVE) ×4
GLOVE SKINSENSE STRL SZ7.0 (GLOVE) IMPLANT
GOWN BRE IMP SLV AUR XL STRL (GOWN DISPOSABLE) ×4 IMPLANT
GOWN STRL REUS W/ TWL LRG LVL3 (GOWN DISPOSABLE) ×3 IMPLANT
GOWN STRL REUS W/TWL LRG LVL3 (GOWN DISPOSABLE) ×9
KIT BASIN OR (CUSTOM PROCEDURE TRAY) ×3 IMPLANT
KIT ROOM TURNOVER OR (KITS) ×3 IMPLANT
NS IRRIG 1000ML POUR BTL (IV SOLUTION) ×3 IMPLANT
PACK CV ACCESS (CUSTOM PROCEDURE TRAY) ×3 IMPLANT
PAD ARMBOARD 7.5X6 YLW CONV (MISCELLANEOUS) ×6 IMPLANT
SPONGE SURGIFOAM ABS GEL 100 (HEMOSTASIS) IMPLANT
SUT PROLENE 6 0 BV (SUTURE) ×3 IMPLANT
SUT VIC AB 3-0 SH 27 (SUTURE) ×3
SUT VIC AB 3-0 SH 27X BRD (SUTURE) ×1 IMPLANT
SUT VICRYL 4-0 PS2 18IN ABS (SUTURE) ×5 IMPLANT
TOWEL OR 17X24 6PK STRL BLUE (TOWEL DISPOSABLE) ×3 IMPLANT
TOWEL OR 17X26 10 PK STRL BLUE (TOWEL DISPOSABLE) ×3 IMPLANT
UNDERPAD 30X30 INCONTINENT (UNDERPADS AND DIAPERS) ×3 IMPLANT
WATER STERILE IRR 1000ML POUR (IV SOLUTION) ×3 IMPLANT

## 2014-02-16 NOTE — Op Note (Signed)
    NAME: [name]   MRN: [id number] DOB: 05/11/1960    DATE OF OPERATION: 02/16/2014  PREOP DIAGNOSIS: Poorly functioning right brachiocephalic AV fistula  POSTOP DIAGNOSIS: Same  PROCEDURE: Superficialization of right brachiocephalic AV fistula and ligation of 3 competing branches.  SURGEON: Di KindleChristopher S. Edilia Boickson, MD, FACS  ASSIST: Tilden FossaLanita Presson RNFA  ANESTHESIA: local with sedation   EBL: Minimal  INDICATIONS: Katherine EvaKimberly J Edelstein is a 53 y.o. female who has had problems with her right brachiocephalic AV fistula. Fistulogram did not demonstrate any significant problems and it was felt that the vein was somewhat deep duplex. For this reason she is brought in for superficialization.  FINDINGS: 3 competing branches were ligated.  TECHNIQUE: The patient was taken to the operating room and sedated by anesthesia. The right upper extremity was prepped and draped in usual sterile fashion. After the skin was infiltrated with approximately, an incision was made over the proximal fistula. The vein here was dissected free circumferentially and fully mobilized.  The dissection was continued centrally. The vein became deeper. I debrided subcutaneous tissue which over lied the vein. A second incision was made longitudinally in the mid upper arm after the skin was anesthetized. Again the vein was dissected free circumferentially. There were 3 competing branches which were all divided between clips and 3-0 silk ties. The dissection was continued further centrally and again the subcutaneous tissue overlying the vein was the debridement in order to help allow for easier cannulation. Hemostasis was obtained in the wound. Each of the wounds was closed with 4-0 subcuticularstitch. Dermabond was applied. The patient tolerated the procedure well and was transferred to the recovery room in stable condition. All needle and sponge counts were correct.  Waverly Ferrarihristopher Konni Kesinger, MD, FACS [facility]  DATE OF DICTATION:   02/16/2014

## 2014-02-16 NOTE — Transfer of Care (Signed)
Immediate Anesthesia Transfer of Care Note  Patient: [name]  Procedure(s) Performed: Procedure(s):  RIGHT Fistula BRACHIOCEPHALIC Superficialization (Right)  Patient Location: PACU  Anesthesia Type:MAC  Level of Consciousness: awake and alert   Airway & Oxygen Therapy: Patient Spontanous Breathing and Patient connected to nasal cannula oxygen  Post-op Assessment: Report given to PACU RN and Post -op Vital signs reviewed and stable  Post vital signs: Reviewed and stable  Complications: No apparent anesthesia complications

## 2014-02-16 NOTE — Anesthesia Preprocedure Evaluation (Addendum)
Anesthesia Evaluation  Patient identified by MRN, date of birth, ID band Patient awake    Reviewed: Allergy & Precautions, H&P , NPO status , Patient's Chart, lab work & pertinent test results, reviewed documented beta blocker date and time   History of Anesthesia Complications (+) PROLONGED EMERGENCE  Airway Mallampati: II  TM Distance: >3 FB Neck ROM: Full    Dental  (+) Teeth Intact, Dental Advisory Given   Pulmonary COPD COPD inhaler,  breath sounds clear to auscultation        Cardiovascular hypertension, Pt. on medications and Pt. on home beta blockers Rhythm:Regular Rate:Normal  3/15 ECHO: EF 60-65%, grade 1 diastolic dysfunction, calcified aortic valve without sig stenosis   Neuro/Psych negative neurological ROS     GI/Hepatic Neg liver ROS, GERD-  Medicated and Controlled,  Endo/Other  diabetes (glu 253), Insulin DependentMorbid obesity  Renal/GU ESRF and DialysisRenal disease (TuThSa, K+ 4.0)     Musculoskeletal   Abdominal (+) + obese,   Peds  Hematology   Anesthesia Other Findings   Reproductive/Obstetrics                           Anesthesia Physical Anesthesia Plan  ASA: III  Anesthesia Plan: MAC   Post-op Pain Management:    Induction: Intravenous  Airway Management Planned: Natural Airway  Additional Equipment:   Intra-op Plan:   Post-operative Plan:   Informed Consent: I have reviewed the patients History and Physical, chart, labs and discussed the procedure including the risks, benefits and alternatives for the proposed anesthesia with the patient or authorized representative who has indicated his/her understanding and acceptance.   Dental advisory given  Plan Discussed with: CRNA and Surgeon  Anesthesia Plan Comments: (Plan routine monitors, MAC)        Anesthesia Quick Evaluation

## 2014-02-16 NOTE — Anesthesia Postprocedure Evaluation (Signed)
  Anesthesia Post-op Note  Patient: [name]  Procedure(s) Performed: Procedure(s):  RIGHT Fistula BRACHIOCEPHALIC Superficialization (Right)  Patient Location: PACU  Anesthesia Type:MAC  Level of Consciousness: awake, alert , oriented and patient cooperative  Airway and Oxygen Therapy: Patient Spontanous Breathing  Post-op Pain: none  Post-op Assessment: Post-op Vital signs reviewed, Patient's Cardiovascular Status Stable, Respiratory Function Stable, Patent Airway, No signs of Nausea or vomiting and Pain level controlled  Post-op Vital Signs: Reviewed and stable  Last Vitals:  Filed Vitals:   02/16/14 1312  BP: 120/59  Pulse: 64  Temp:   Resp: 13    Complications: No apparent anesthesia complications

## 2014-02-16 NOTE — H&P (Signed)
[facility]  Patient name: Katherine MarionKimberly J ShoffnerMRN: [id number] DOB: 9/8/1962Sex: female  HPI: [name] is a 53 y.o. female who had a brachiocephalic fistula placed on 07/18/2013. She was last seen on 10/04/2013 at which point the fistula appeared to be functioning well. In reviewing her records, it looks like she had a tunneled dialysis catheter placed by Dr. Arbie CookeyEarly on 12/22/2013.  She tells me that they have been having a difficult time cannulating her fistula. It is easy to cannulate near the antecubital level, however it they're having a hard time with the second needle in the mid upper arm. This reason at this point there simply using the catheter and had been using this for the last 2 weeks.  She was in our office on 01/08/2014 and had a duplex of the fistula. This showed that the diameters of the fistula range from 0.76 cm-1.2 cm. The depths ranged from 0.35-0.96 and the proximal arm. It was really only in the distal aspect of the fistula in the upper all arm that the vein became deeper.  She dialyzes on Tuesdays Thursdays and Saturdays.  Past Medical History  Diagnosis Date  . Hypertension   . Diabetes mellitus   . Anemia    No family history on file. SOCIAL HISTORY: History  Substance Use Topics  . Smoking status: Never Smoker   . Smokeless tobacco: Not on file  . Alcohol Use: No   Allergies  Allergen Reactions  . Codeine Other (See Comments)    hallucinations   Current Outpatient Prescriptions  Medication Sig Dispense Refill  . albuterol (PROVENTIL) (2.5 MG/3ML) 0.083% nebulizer solution Take 2.5 mg by nebulization every 6 (six) hours as needed for wheezing or shortness of breath.    Marland Kitchen. atenolol (TENORMIN) 25 MG tablet Take 1 tablet (25 mg total) by mouth at bedtime. 30 tablet 2  . calcium carbonate (TUMS - DOSED IN MG ELEMENTAL CALCIUM) 500 MG chewable  tablet Chew 1 tablet by mouth 3 (three) times daily.    . clonazePAM (KLONOPIN) 0.5 MG tablet Take 0.5 mg by mouth at bedtime.    . darbepoetin (ARANESP) 60 MCG/0.3ML SOLN injection Inject 0.3 mLs (60 mcg total) into the skin every Saturday at 6 PM. 4.2 mL 0  . doxercalciferol (HECTOROL) 4 MCG/2ML injection Inject 2 mLs (4 mcg total) into the vein Every Tuesday,Thursday,and Saturday with dialysis. 2 mL 0  . ferric gluconate 125 mg in sodium chloride 0.9 % 100 mL Inject 125 mg into the vein Every Tuesday,Thursday,and Saturday with dialysis. 125 ampule 0  . insulin aspart (NOVOLOG) 100 UNIT/ML injection Inject 0-10 Units into the skin 4 (four) times daily - after meals and at bedtime. Per sliding scale.    . insulin glargine (LANTUS) 100 UNIT/ML injection Inject 0.4 mLs (40 Units total) into the skin at bedtime. 10 mL 11  . lanthanum (FOSRENOL) 1000 MG chewable tablet Chew 1,000 mg by mouth 3 (three) times daily with meals.    Marland Kitchen. linagliptin (TRADJENTA) 5 MG TABS tablet Take 1 tablet (5 mg total) by mouth daily. 30 tablet 5  . multivitamin (RENA-VIT) TABS tablet Take 1 tablet by mouth at bedtime. 30 tablet 5  . pantoprazole (PROTONIX) 40 MG tablet Take 1 tablet (40 mg total) by mouth daily. 30 tablet 5  . promethazine (PHENERGAN) 25 MG tablet Take 1 tablet (25 mg total) by mouth every 6 (six) hours as needed for nausea or vomiting. 30 tablet 0  . zaleplon (SONATA)  10 MG capsule Take 10 mg by mouth at bedtime as needed for sleep.     No current facility-administered medications for this visit.   REVIEW OF SYSTEMS: Arly.Keller[X ] denotes positive finding; [ ]  denotes negative finding  CARDIOVASCULAR: [ ]  chest pain [ ]  chest pressure [ ]  palpitations [ ]  orthopnea  [ ]  dyspnea on exertion [ ]  claudication [ ]  rest pain [ ]  DVT [ ]  phlebitis PULMONARY: [ ]  productive cough [ ]  asthma [  ] wheezing NEUROLOGIC: [ ]  weakness [ ]  paresthesias [ ]  aphasia [ ]  amaurosis [ ]  dizziness HEMATOLOGIC: [ ]  bleeding problems [ ]  clotting disorders MUSCULOSKELETAL: [ ]  joint pain [ ]  joint swelling [ ]  leg swelling GASTROINTESTINAL: [ ]  blood in stool [ ]  hematemesis GENITOURINARY: [ ]  dysuria [ ]  hematuria PSYCHIATRIC: [ ]  history of major depression INTEGUMENTARY: [ ]  rashes [ ]  ulcers CONSTITUTIONAL: [ ]  fever [ ]  chills  PHYSICAL EXAM: Filed Vitals:              :       Filed Vitals:   02/16/14 0815  BP: 133/69  Pulse: 68  Temp: 98.1 F (36.7 C)  Resp: 20    Body mass index is 36.09 kg/(m^2). GENERAL: The patient is a well-nourished female, in no acute distress. The vital signs are documented above. CARDIOVASCULAR: There is a regular rate and rhythm.  PULMONARY: There is good air exchange bilaterally without wheezing or rales. She has a palpable right radial pulse. She has an excellent thrill in her right upper arm fistula.  DATA:  Her fistulogram shows no evidence of central venous stenosis. She has a widely patent right brachiocephalic AV fistula. There were no significant competing branches.Marland Kitchen.  MEDICAL ISSUES:  ESRD on dialysis On exam the fistula appears to have excellent thrill and I'm not sure why they're having a difficult time with access. The vein only appears to be deep in the upper arm. fistulogram shows no significant problems with her fistula. I think the only option is to try to superficialize the fistula. I have discussed the procedure potential consultations with the patient and she is agreeable to proceed.  [name]

## 2014-02-16 NOTE — Telephone Encounter (Addendum)
-----   Message from Erenest Blankarol Sue Pullins, RN sent at 02/16/2014 11:45 AM EST ----- Regarding: Joyce GrossKay log ; needs appt. in 4 wks. with CSD, and duplex of AVF   ----- Message -----    From: Chuck Hinthristopher S Dickson, MD    Sent: 02/16/2014  11:43 AM      To: [facility] Charge Pool Subject: charge and f/u                                 PROCEDURE: Superficialization of right brachiocephalic AV fistula and ligation of 3 competing branches.  SURGEON: Di KindleChristopher S. Edilia Boickson, MD, FACS  ASSIST: Tilden FossaLanita Presson RNFA  She will need a follow up visit in approximately 4 weeks with a duplex of her fistula at that time. Thank you. CD  notified patient of post op appt. with dr. Edilia Bodickson on 04-11-14 at 3pm

## 2014-02-20 ENCOUNTER — Encounter (HOSPITAL_COMMUNITY): Payer: Self-pay | Admitting: Vascular Surgery

## 2014-03-22 ENCOUNTER — Encounter (HOSPITAL_COMMUNITY): Payer: Self-pay | Admitting: Vascular Surgery

## 2014-04-10 ENCOUNTER — Encounter: Payer: Self-pay | Admitting: Vascular Surgery

## 2014-04-11 ENCOUNTER — Ambulatory Visit (INDEPENDENT_AMBULATORY_CARE_PROVIDER_SITE_OTHER): Payer: Self-pay | Admitting: Vascular Surgery

## 2014-04-11 ENCOUNTER — Encounter: Payer: Self-pay | Admitting: Vascular Surgery

## 2014-04-11 ENCOUNTER — Ambulatory Visit (HOSPITAL_COMMUNITY)
Admit: 2014-04-11 | Discharge: 2014-04-11 | Disposition: A | Discharge status: Home / Self Care | Payer: Medicaid Other | Attending: Vascular Surgery | Admitting: Vascular Surgery

## 2014-04-11 ENCOUNTER — Encounter: Payer: Self-pay | Admitting: *Deleted

## 2014-04-11 VITALS — BP 99/47 | HR 61 | Resp 14 | Ht 61.0 in | Wt 192.0 lb

## 2014-04-11 DIAGNOSIS — N186 End stage renal disease: Secondary | ICD-10-CM

## 2014-04-11 DIAGNOSIS — Z992 Dependence on renal dialysis: Secondary | ICD-10-CM

## 2014-04-11 DIAGNOSIS — Z4931 Encounter for adequacy testing for hemodialysis: Secondary | ICD-10-CM

## 2014-04-11 NOTE — Progress Notes (Signed)
   Patient name: [name] MRN: [id number] DOB: 12/02/1960 Sex: female  REASON FOR VISIT: Follow up of right brachiocephalic AV fistula  HPI: [name] is a 53 y.o. female who had a right brachiocephalic AV fistula placed in April of this year. She had been having problems with cannulation of the fistula and therefore on 02/16/2014 she underwent superficialization of the fistula and ligation of 3 competing branches. She returns for a follow up visit.  She has been using her tunneled dialysis catheter for dialysis. She has no specific complaints.  REVIEW OF SYSTEMS: Arly.Keller[X ] denotes positive finding; [  ] denotes negative finding  CARDIOVASCULAR:  [ ]  chest pain   [ ]  dyspnea on exertion    CONSTITUTIONAL:  [ ]  fever   [ ]  chills  PHYSICAL EXAM: Filed Vitals:   04/11/14 1606  BP: 99/47  Pulse: 61  Resp: 14  Height: 5\' 1"  (1.549 m)  Weight: 192 lb (87.091 kg)   Body mass index is 36.3 kg/(m^2). GENERAL: The patient is a well-nourished female, in no acute distress. The vital signs are documented above. CARDIOVASCULAR: There is a regular rate and rhythm. PULMONARY: There is good air exchange bilaterally without wheezing or rales. Her fistula has a good thrill and I think it should be usable.  I have independently interpreted her duplex scan which shows that her fistula is patent. Diameters of the fistula range from 0.67 cm-0.92 cm. Range from 0.37 cm-0.83 cm.  MEDICAL ISSUES: I think it would be reasonable to begin using her right brachiocephalic fistula. Initially it would probably best to try one needle and that this works well then transition to 2 needles. If there are any problems with using the fistula that I think she would need to be scheduled for a fistulogram.  DICKSON,CHRISTOPHER S [facility] Beeper: [phone]

## 2014-04-20 ENCOUNTER — Telehealth: Payer: Self-pay | Admitting: *Deleted

## 2014-04-20 NOTE — Telephone Encounter (Signed)
[facility] us a request to see [name] for a swollen, painful and red left 5th toe. This was faxed to us on 04-17-14; [facility] one of the nurses to get more information because we had just seen her on 04-11-14 and she didn't mention any problem about her toe. The nurse had done a foot check on the patient on 03-29-14 and charted no problems with patient's feet. I have tried to call [name] 3 times since then and have not been able to get her to obtain more information.  I called the [facility] back today and talked to Nurse Francis DowseJoel, he said that Dr. Kathrene BongoGoldsborough had started her on an antibiotic on 04-17-14, he wasn't sure which one and couldn't give any updated information about her toe. I again tried to call patient; her home phone [phone] has a full mailbox and so I left a detailed message on her cell phone # [phone] asking her to contact us about this toe problem.

## 2014-04-21 ENCOUNTER — Emergency Department (HOSPITAL_COMMUNITY): Payer: Medicaid Other

## 2014-04-21 ENCOUNTER — Inpatient Hospital Stay (HOSPITAL_COMMUNITY)
Admission: EM | Admit: 2014-04-21 | Discharge: 2014-05-07 | DRG: 477 | Disposition: A | Discharge status: Skilled Nursing Facility | Payer: Medicaid Other | Attending: Internal Medicine | Admitting: Internal Medicine

## 2014-04-21 ENCOUNTER — Encounter (HOSPITAL_COMMUNITY): Payer: Self-pay | Admitting: *Deleted

## 2014-04-21 DIAGNOSIS — Z8614 Personal history of Methicillin resistant Staphylococcus aureus infection: Secondary | ICD-10-CM

## 2014-04-21 DIAGNOSIS — I441 Atrioventricular block, second degree: Secondary | ICD-10-CM | POA: Diagnosis present

## 2014-04-21 DIAGNOSIS — L02619 Cutaneous abscess of unspecified foot: Secondary | ICD-10-CM | POA: Insufficient documentation

## 2014-04-21 DIAGNOSIS — N189 Chronic kidney disease, unspecified: Secondary | ICD-10-CM

## 2014-04-21 DIAGNOSIS — Z6835 Body mass index (BMI) 35.0-35.9, adult: Secondary | ICD-10-CM | POA: Diagnosis not present

## 2014-04-21 DIAGNOSIS — L03032 Cellulitis of left toe: Secondary | ICD-10-CM

## 2014-04-21 DIAGNOSIS — I120 Hypertensive chronic kidney disease with stage 5 chronic kidney disease or end stage renal disease: Secondary | ICD-10-CM | POA: Diagnosis present

## 2014-04-21 DIAGNOSIS — E11621 Type 2 diabetes mellitus with foot ulcer: Secondary | ICD-10-CM | POA: Diagnosis present

## 2014-04-21 DIAGNOSIS — Z992 Dependence on renal dialysis: Secondary | ICD-10-CM

## 2014-04-21 DIAGNOSIS — R778 Other specified abnormalities of plasma proteins: Secondary | ICD-10-CM | POA: Diagnosis present

## 2014-04-21 DIAGNOSIS — L97509 Non-pressure chronic ulcer of other part of unspecified foot with unspecified severity: Secondary | ICD-10-CM | POA: Diagnosis not present

## 2014-04-21 DIAGNOSIS — M5136 Other intervertebral disc degeneration, lumbar region: Secondary | ICD-10-CM | POA: Diagnosis present

## 2014-04-21 DIAGNOSIS — Z01810 Encounter for preprocedural cardiovascular examination: Secondary | ICD-10-CM | POA: Diagnosis not present

## 2014-04-21 DIAGNOSIS — D72829 Elevated white blood cell count, unspecified: Secondary | ICD-10-CM | POA: Insufficient documentation

## 2014-04-21 DIAGNOSIS — E871 Hypo-osmolality and hyponatremia: Secondary | ICD-10-CM | POA: Diagnosis present

## 2014-04-21 DIAGNOSIS — E119 Type 2 diabetes mellitus without complications: Secondary | ICD-10-CM

## 2014-04-21 DIAGNOSIS — R7989 Other specified abnormal findings of blood chemistry: Secondary | ICD-10-CM | POA: Diagnosis present

## 2014-04-21 DIAGNOSIS — M4646 Discitis, unspecified, lumbar region: Secondary | ICD-10-CM | POA: Diagnosis present

## 2014-04-21 DIAGNOSIS — E11649 Type 2 diabetes mellitus with hypoglycemia without coma: Secondary | ICD-10-CM | POA: Diagnosis not present

## 2014-04-21 DIAGNOSIS — E1165 Type 2 diabetes mellitus with hyperglycemia: Secondary | ICD-10-CM | POA: Diagnosis present

## 2014-04-21 DIAGNOSIS — R509 Fever, unspecified: Secondary | ICD-10-CM

## 2014-04-21 DIAGNOSIS — K219 Gastro-esophageal reflux disease without esophagitis: Secondary | ICD-10-CM | POA: Diagnosis present

## 2014-04-21 DIAGNOSIS — M4804 Spinal stenosis, thoracic region: Secondary | ICD-10-CM | POA: Diagnosis present

## 2014-04-21 DIAGNOSIS — D631 Anemia in chronic kidney disease: Secondary | ICD-10-CM | POA: Diagnosis present

## 2014-04-21 DIAGNOSIS — R001 Bradycardia, unspecified: Secondary | ICD-10-CM | POA: Diagnosis present

## 2014-04-21 DIAGNOSIS — M79609 Pain in unspecified limb: Secondary | ICD-10-CM | POA: Diagnosis not present

## 2014-04-21 DIAGNOSIS — L03116 Cellulitis of left lower limb: Secondary | ICD-10-CM | POA: Diagnosis present

## 2014-04-21 DIAGNOSIS — Z794 Long term (current) use of insulin: Secondary | ICD-10-CM

## 2014-04-21 DIAGNOSIS — Z981 Arthrodesis status: Secondary | ICD-10-CM | POA: Diagnosis not present

## 2014-04-21 DIAGNOSIS — N186 End stage renal disease: Secondary | ICD-10-CM

## 2014-04-21 DIAGNOSIS — E1143 Type 2 diabetes mellitus with diabetic autonomic (poly)neuropathy: Secondary | ICD-10-CM

## 2014-04-21 DIAGNOSIS — W19XXXA Unspecified fall, initial encounter: Secondary | ICD-10-CM | POA: Diagnosis present

## 2014-04-21 DIAGNOSIS — R29898 Other symptoms and signs involving the musculoskeletal system: Secondary | ICD-10-CM | POA: Insufficient documentation

## 2014-04-21 DIAGNOSIS — L03039 Cellulitis of unspecified toe: Secondary | ICD-10-CM

## 2014-04-21 DIAGNOSIS — E669 Obesity, unspecified: Secondary | ICD-10-CM | POA: Diagnosis present

## 2014-04-21 DIAGNOSIS — N2581 Secondary hyperparathyroidism of renal origin: Secondary | ICD-10-CM | POA: Diagnosis not present

## 2014-04-21 DIAGNOSIS — I96 Gangrene, not elsewhere classified: Secondary | ICD-10-CM | POA: Diagnosis not present

## 2014-04-21 DIAGNOSIS — R0602 Shortness of breath: Secondary | ICD-10-CM

## 2014-04-21 DIAGNOSIS — M4644 Discitis, unspecified, thoracic region: Secondary | ICD-10-CM | POA: Diagnosis not present

## 2014-04-21 DIAGNOSIS — L02612 Cutaneous abscess of left foot: Secondary | ICD-10-CM

## 2014-04-21 DIAGNOSIS — E11628 Type 2 diabetes mellitus with other skin complications: Secondary | ICD-10-CM | POA: Diagnosis present

## 2014-04-21 DIAGNOSIS — I10 Essential (primary) hypertension: Secondary | ICD-10-CM | POA: Diagnosis present

## 2014-04-21 DIAGNOSIS — L089 Local infection of the skin and subcutaneous tissue, unspecified: Secondary | ICD-10-CM | POA: Diagnosis present

## 2014-04-21 DIAGNOSIS — E1122 Type 2 diabetes mellitus with diabetic chronic kidney disease: Secondary | ICD-10-CM

## 2014-04-21 DIAGNOSIS — R748 Abnormal levels of other serum enzymes: Secondary | ICD-10-CM | POA: Diagnosis present

## 2014-04-21 DIAGNOSIS — M4640 Discitis, unspecified, site unspecified: Secondary | ICD-10-CM | POA: Diagnosis present

## 2014-04-21 DIAGNOSIS — M4645 Discitis, unspecified, thoracolumbar region: Secondary | ICD-10-CM

## 2014-04-21 DIAGNOSIS — A419 Sepsis, unspecified organism: Secondary | ICD-10-CM

## 2014-04-21 DIAGNOSIS — F419 Anxiety disorder, unspecified: Secondary | ICD-10-CM | POA: Diagnosis present

## 2014-04-21 LAB — CBC
HCT: 33.1 % — ABNORMAL LOW (ref 36.0–46.0)
HEMOGLOBIN: 10.6 g/dL — AB (ref 12.0–15.0)
MCH: 27.5 pg (ref 26.0–34.0)
MCHC: 32 g/dL (ref 30.0–36.0)
MCV: 86 fL (ref 78.0–100.0)
Platelets: 315 10*3/uL (ref 150–400)
RBC: 3.85 MIL/uL — ABNORMAL LOW (ref 3.87–5.11)
RDW: 17.7 % — AB (ref 11.5–15.5)
WBC: 10.7 10*3/uL — ABNORMAL HIGH (ref 4.0–10.5)

## 2014-04-21 LAB — DIFFERENTIAL
BASOS ABS: 0 10*3/uL (ref 0.0–0.1)
Basophils Relative: 0 % (ref 0–1)
EOS ABS: 0.1 10*3/uL (ref 0.0–0.7)
Eosinophils Relative: 1 % (ref 0–5)
LYMPHS ABS: 0.6 10*3/uL — AB (ref 0.7–4.0)
LYMPHS PCT: 6 % — AB (ref 12–46)
Monocytes Absolute: 0.5 10*3/uL (ref 0.1–1.0)
Monocytes Relative: 5 % (ref 3–12)
Neutro Abs: 9.5 10*3/uL — ABNORMAL HIGH (ref 1.7–7.7)
Neutrophils Relative %: 88 % — ABNORMAL HIGH (ref 43–77)

## 2014-04-21 LAB — I-STAT TROPONIN, ED: TROPONIN I, POC: 0.01 ng/mL (ref 0.00–0.08)

## 2014-04-21 LAB — COMPREHENSIVE METABOLIC PANEL
ALBUMIN: 3.1 g/dL — AB (ref 3.5–5.2)
ALT: 6 U/L (ref 0–35)
AST: 28 U/L (ref 0–37)
Alkaline Phosphatase: 106 U/L (ref 39–117)
Anion gap: 18 — ABNORMAL HIGH (ref 5–15)
BUN: 33 mg/dL — ABNORMAL HIGH (ref 6–23)
CHLORIDE: 89 meq/L — AB (ref 96–112)
CO2: 21 mmol/L (ref 19–32)
CREATININE: 6.44 mg/dL — AB (ref 0.50–1.10)
Calcium: 10 mg/dL (ref 8.4–10.5)
GFR calc Af Amer: 8 mL/min — ABNORMAL LOW (ref >90)
GFR calc non Af Amer: 7 mL/min — ABNORMAL LOW (ref >90)
GLUCOSE: 460 mg/dL — AB (ref 70–99)
Potassium: 4.6 mmol/L (ref 3.5–5.1)
Sodium: 128 mmol/L — ABNORMAL LOW (ref 135–145)
Total Bilirubin: 1.4 mg/dL — ABNORMAL HIGH (ref 0.3–1.2)
Total Protein: 6.9 g/dL (ref 6.0–8.3)

## 2014-04-21 LAB — APTT: aPTT: 31 seconds (ref 24–37)

## 2014-04-21 LAB — GLUCOSE, CAPILLARY: GLUCOSE-CAPILLARY: 427 mg/dL — AB (ref 70–99)

## 2014-04-21 LAB — SEDIMENTATION RATE: Sed Rate: 60 mm/hr — ABNORMAL HIGH (ref 0–22)

## 2014-04-21 LAB — I-STAT CG4 LACTIC ACID, ED: Lactic Acid, Venous: 1.62 mmol/L (ref 0.5–2.2)

## 2014-04-21 MED ORDER — ALUM & MAG HYDROXIDE-SIMETH 200-200-20 MG/5ML PO SUSP
30.0000 mL | Freq: Four times a day (QID) | ORAL | Status: DC | PRN
  Administered 2014-04-23: 30 mL via ORAL
  Filled 2014-04-21 (×2): qty 30

## 2014-04-21 MED ORDER — PROMETHAZINE HCL 25 MG PO TABS
25.0000 mg | ORAL_TABLET | Freq: Four times a day (QID) | ORAL | Status: DC | PRN
  Administered 2014-04-22 – 2014-04-26 (×3): 25 mg via ORAL
  Filled 2014-04-21 (×2): qty 1

## 2014-04-21 MED ORDER — OXYCODONE HCL 5 MG PO TABS
5.0000 mg | ORAL_TABLET | ORAL | Status: DC | PRN
  Administered 2014-04-22 – 2014-05-04 (×29): 5 mg via ORAL
  Filled 2014-04-21 (×27): qty 1

## 2014-04-21 MED ORDER — ACETAMINOPHEN 325 MG PO TABS
650.0000 mg | ORAL_TABLET | Freq: Four times a day (QID) | ORAL | Status: DC | PRN
  Administered 2014-05-06: 650 mg via ORAL
  Filled 2014-04-21: qty 2

## 2014-04-21 MED ORDER — ATENOLOL 25 MG PO TABS
25.0000 mg | ORAL_TABLET | Freq: Every day | ORAL | Status: DC
  Administered 2014-04-22: 25 mg via ORAL
  Filled 2014-04-21 (×2): qty 1

## 2014-04-21 MED ORDER — HYDROMORPHONE HCL 1 MG/ML IJ SOLN
0.5000 mg | INTRAMUSCULAR | Status: DC | PRN
  Administered 2014-04-22 – 2014-05-02 (×30): 1 mg via INTRAVENOUS
  Filled 2014-04-21 (×28): qty 1

## 2014-04-21 MED ORDER — VANCOMYCIN HCL IN DEXTROSE 1-5 GM/200ML-% IV SOLN: 1000.0000 mg | INTRAVENOUS | Status: DC

## 2014-04-21 MED ORDER — ONDANSETRON HCL 4 MG/2ML IJ SOLN
4.0000 mg | Freq: Four times a day (QID) | INTRAMUSCULAR | Status: DC | PRN
  Administered 2014-04-22 – 2014-04-26 (×7): 4 mg via INTRAVENOUS
  Filled 2014-04-21 (×7): qty 2

## 2014-04-21 MED ORDER — SODIUM CHLORIDE 0.9 % IJ SOLN: 3.0000 mL | INTRAMUSCULAR | Status: DC | PRN

## 2014-04-21 MED ORDER — ONDANSETRON 4 MG PO TBDP
8.0000 mg | ORAL_TABLET | Freq: Once | ORAL | Status: AC
  Administered 2014-04-21: 8 mg via ORAL
  Filled 2014-04-21: qty 2

## 2014-04-21 MED ORDER — INSULIN DETEMIR 100 UNIT/ML ~~LOC~~ SOLN
41.0000 [IU] | Freq: Every day | SUBCUTANEOUS | Status: DC
Start: 2014-04-21 — End: 2014-04-23
  Administered 2014-04-22 (×2): 41 [IU] via SUBCUTANEOUS
  Filled 2014-04-21 (×3): qty 0.41

## 2014-04-21 MED ORDER — PANTOPRAZOLE SODIUM 40 MG PO TBEC
40.0000 mg | DELAYED_RELEASE_TABLET | Freq: Every day | ORAL | Status: DC
  Administered 2014-04-22 – 2014-05-06 (×14): 40 mg via ORAL
  Filled 2014-04-21 (×14): qty 1

## 2014-04-21 MED ORDER — CLONAZEPAM 0.5 MG PO TABS
0.5000 mg | ORAL_TABLET | Freq: Every day | ORAL | Status: DC
  Administered 2014-04-22 – 2014-05-06 (×16): 0.5 mg via ORAL
  Filled 2014-04-21 (×16): qty 1

## 2014-04-21 MED ORDER — LANTHANUM CARBONATE 500 MG PO CHEW
1000.0000 mg | CHEWABLE_TABLET | Freq: Three times a day (TID) | ORAL | Status: DC
  Administered 2014-04-22 – 2014-05-07 (×24): 1000 mg via ORAL
  Filled 2014-04-21 (×49): qty 2

## 2014-04-21 MED ORDER — ONDANSETRON HCL 4 MG PO TABS: 4.0000 mg | ORAL_TABLET | Freq: Four times a day (QID) | ORAL | Status: DC | PRN

## 2014-04-21 MED ORDER — HYDROCODONE-ACETAMINOPHEN 5-325 MG PO TABS
1.0000 | ORAL_TABLET | Freq: Once | ORAL | Status: AC
  Administered 2014-04-21: 1 via ORAL
  Filled 2014-04-21: qty 1

## 2014-04-21 MED ORDER — DEXTROSE 5 % IV SOLN
1.0000 g | INTRAVENOUS | Status: DC
  Filled 2014-04-21: qty 10

## 2014-04-21 MED ORDER — RENA-VITE PO TABS
1.0000 | ORAL_TABLET | Freq: Every day | ORAL | Status: DC
Start: 2014-04-21 — End: 2014-05-07
  Administered 2014-04-22 – 2014-05-06 (×16): 1 via ORAL
  Filled 2014-04-21 (×20): qty 1

## 2014-04-21 MED ORDER — LANTHANUM CARBONATE 500 MG PO CHEW
1000.0000 mg | CHEWABLE_TABLET | Freq: Two times a day (BID) | ORAL | Status: DC | PRN
  Administered 2014-04-24 – 2014-05-07 (×4): 1000 mg via ORAL
  Filled 2014-04-21 (×5): qty 2

## 2014-04-21 MED ORDER — ACETAMINOPHEN 650 MG RE SUPP
650.0000 mg | Freq: Four times a day (QID) | RECTAL | Status: DC | PRN
Start: 2014-04-21 — End: 2014-04-26

## 2014-04-21 MED ORDER — DEXTROSE 5 % IV SOLN
2.0000 g | INTRAVENOUS | Status: DC
  Administered 2014-04-21: 2 g via INTRAVENOUS
  Filled 2014-04-21 (×2): qty 2

## 2014-04-21 MED ORDER — SODIUM CHLORIDE 0.9 % IV SOLN: 250.0000 mL | INTRAVENOUS | Status: DC | PRN

## 2014-04-21 MED ORDER — SODIUM CHLORIDE 0.9 % IJ SOLN
3.0000 mL | Freq: Two times a day (BID) | INTRAMUSCULAR | Status: DC
  Administered 2014-04-21 – 2014-04-28 (×9): 3 mL via INTRAVENOUS

## 2014-04-21 NOTE — Consult Note (Signed)
Reason for Consult: T10-11 abnormality, back pain, Referring Physician: Dr. Shary Decamp is an 54 y.o. female.  HPI: The patient is a 54 year old white female who presents with a very complicated history. She has a history of chronic back pain. She's undergone a lumbar laminectomy discectomy approximately 17 years ago by Dr. Hal Neer. The patient has had some chronic back pain in fact saw him about 2 years ago. She was worked up with a lumbar MRI in January 2014. This MRI demonstrated some lumbar degenerative changes. Of note the T10-11 disc space was not imaged on that study.  The patient said her back pain was under control until a couple months ago. She states that in November she's had some increasing back pain which she attributed to the change in weather. About a month ago her pain worsened. Her pain became particularly severe about 2 weeks ago. She has seen her primary doctor, Dr. Mertha Finders, M.D. treated with medications.  The patient's back pain seemed worse today and she felt her legs were weak. She took a fall. She was brought to [facility] emerged department via EMS. She was worked up with a lumbar MRI. This demonstrated the patient had inflammatory changes at T10-11, possible discitis/osteomyelitis. A neurosurgical consultation has been requested. The patient tells me she does not have back pain per se but feels a diffuse sense of pressure. She had some numbness in her legs earlier today but this has resolved. She denies truncal or perineal numbness.  The patient is on hemodialysis secondary to renal failure since April 2015. Her nephrologist is Dr. Moshe Cipro. The patient tells me that about 3 months ago she had an infected right vas cath. The vascular catheter was removed and he was treated with antibiotics. The catheter was placed on the left. The patient has also been treated for a left leg cellulitis by Dr. Moshe Cipro about a month ago. The patient also has some  sort of injury to her left foot and had quite a bit of swelling of her left toe and foot. This has improved a bit since being started on antibiotics by the nephrologist.   Past Medical History  Diagnosis Date  . Hypertension   . Anemia   . Diabetes mellitus     Type 2  . Chronic kidney disease     T,Th,Sa dialysis  . GERD (gastroesophageal reflux disease)     protonix for "burping"  . Complication of anesthesia     had trouble being put to sleep and then difficulty waking up. States she doesn't have any issues with Propofol    Past Surgical History  Procedure Laterality Date  . Insertion of dialysis catheter N/A 07/16/2013    Procedure: INSERTION OF DIALYSIS CATHETER;  Surgeon: Rosetta Posner, MD;  Location: [place];  Service: Vascular;  Laterality: N/A;  . Av fistula placement Right 07/18/2013    Procedure: RIGHT ARTERIOVENOUS (AV) FISTULA CREATION;  Surgeon: Elam Dutch, MD;  Location: [place];  Service: Vascular;  Laterality: Right;  . Insertion of dialysis catheter Left 12/22/2013    Procedure: INSERTION OF DIALYSIS CATHETER;  Surgeon: Rosetta Posner, MD;  Location: [place];  Service: Vascular;  Laterality: Left;  . Back surgery  17 years    lumbar surgery  . Eye surgery Bilateral     cataract surgery with lens implants  . Hysteroscopy w/ endometrial ablation    . Fistula superficialization Right 02/16/2014    Procedure:  RIGHT Fistula BRACHIOCEPHALIC Superficialization;  Surgeon: Angelia Mould, MD;  Location: [place];  Service: Vascular;  Laterality: Right;  . Fistulogram N/A 01/15/2014    Procedure: FISTULOGRAM;  Surgeon: Angelia Mould, MD;  Location: [facility] CATH LAB;  Service: Cardiovascular;  Laterality: N/A;    Family History  Problem Relation Age of Onset  . Fibromyalgia Mother   . Ovarian cancer Mother   . Rectal cancer Mother   . Parkinson's disease Father     Social History:  reports that she has never smoked. She has never used smokeless tobacco. She reports  that she does not drink alcohol or use illicit drugs.  Allergies:  Allergies  Allergen Reactions  . Codeine Other (See Comments)    hallucinations    Medications:  I have reviewed the patient's current medications. Prior to Admission:  (Not in a hospital admission) Scheduled: Continuous: PRN:   Anti-infectives    None       Results for orders placed or performed during the hospital encounter of 04/21/14 (from the past 48 hour(s))  APTT     Status: None   Collection Time: 04/21/14  2:55 PM  Result Value Ref Range   aPTT 31 24 - 37 seconds  CBC     Status: Abnormal   Collection Time: 04/21/14  2:55 PM  Result Value Ref Range   WBC 10.7 (H) 4.0 - 10.5 K/uL   RBC 3.85 (L) 3.87 - 5.11 MIL/uL   Hemoglobin 10.6 (L) 12.0 - 15.0 g/dL   HCT 33.1 (L) 36.0 - 46.0 %   MCV 86.0 78.0 - 100.0 fL   MCH 27.5 26.0 - 34.0 pg   MCHC 32.0 30.0 - 36.0 g/dL   RDW 17.7 (H) 11.5 - 15.5 %   Platelets 315 150 - 400 K/uL  Differential     Status: Abnormal   Collection Time: 04/21/14  2:55 PM  Result Value Ref Range   Neutrophils Relative % 88 (H) 43 - 77 %   Neutro Abs 9.5 (H) 1.7 - 7.7 K/uL   Lymphocytes Relative 6 (L) 12 - 46 %   Lymphs Abs 0.6 (L) 0.7 - 4.0 K/uL   Monocytes Relative 5 3 - 12 %   Monocytes Absolute 0.5 0.1 - 1.0 K/uL   Eosinophils Relative 1 0 - 5 %   Eosinophils Absolute 0.1 0.0 - 0.7 K/uL   Basophils Relative 0 0 - 1 %   Basophils Absolute 0.0 0.0 - 0.1 K/uL  Comprehensive metabolic panel     Status: Abnormal   Collection Time: 04/21/14  2:55 PM  Result Value Ref Range   Sodium 128 (L) 135 - 145 mmol/L    Comment: Please note change in reference range.   Potassium 4.6 3.5 - 5.1 mmol/L    Comment: Please note change in reference range.   Chloride 89 (L) 96 - 112 mEq/L   CO2 21 19 - 32 mmol/L   Glucose, Bld 460 (H) 70 - 99 mg/dL   BUN 33 (H) 6 - 23 mg/dL   Creatinine, Ser 6.44 (H) 0.50 - 1.10 mg/dL   Calcium 10.0 8.4 - 10.5 mg/dL   Total Protein 6.9 6.0 - 8.3  g/dL   Albumin 3.1 (L) 3.5 - 5.2 g/dL   AST 28 0 - 37 U/L   ALT 6 0 - 35 U/L   Alkaline Phosphatase 106 39 - 117 U/L   Total Bilirubin 1.4 (H) 0.3 - 1.2 mg/dL   GFR calc non Af Amer 7 (L) >90 mL/min  GFR calc Af Amer 8 (L) >90 mL/min    Comment: (NOTE) The eGFR has been calculated using the CKD EPI equation. This calculation has not been validated in all clinical situations. eGFR's persistently <90 mL/min signify possible Chronic Kidney Disease.    Anion gap 18 (H) 5 - 15  I-stat troponin, ED (not at [facility])     Status: None   Collection Time: 04/21/14  3:01 PM  Result Value Ref Range   Troponin i, poc 0.01 0.00 - 0.08 ng/mL   Comment 3            Comment: Due to the release kinetics of cTnI, a negative result within the first hours of the onset of symptoms does not rule out myocardial infarction with certainty. If myocardial infarction is still suspected, repeat the test at appropriate intervals.   I-Stat CG4 Lactic Acid, ED     Status: None   Collection Time: 04/21/14  4:52 PM  Result Value Ref Range   Lactic Acid, Venous 1.62 0.5 - 2.2 mmol/L    Mr Lumbar Spine Wo Contrast  04/21/2014   CLINICAL DATA:  Worsening back pain and 1 month of bilateral leg weakness, worse today. Fall.  EXAM: MRI LUMBAR SPINE WITHOUT CONTRAST  TECHNIQUE: Multiplanar, multisequence MR imaging of the lumbar spine was performed. No intravenous contrast was administered.  COMPARISON:  Chest radiographs 07/14/2013. Lumbar spine MRI 05/13/2012.  FINDINGS: There is minimal right convex curvature at the thoracolumbar junction. There is no significant listhesis. The visualized portion of the lower thoracic spine demonstrates complete loss of disc space height at T10-11 which is new from prior chest radiographs and was not included on the prior MRI. There are extensive adjacent marrow changes in the T10 and T11 vertebral bodies, predominantly intermediate to low T1 signal intensity and mixed T2 hypo- and  hyperintensity. STIR images demonstrate mild edema along the endplates. There does not appear to be a large amount of fluid signal in the disc space, although evaluation is somewhat limited by the essentially complete disc space height loss. There is mild anterior wedging of the T10 vertebral body due to superior endplate compression/ resorption. There is also mild depression versus erosion of the T11 superior endplate. There is circumferential displacement of disc material/bone/other soft tissue at the T10-11 disc space level including into the ventral spinal canal. Material in the ventral epidural space measures 6 mm in AP thickness and, together with facet arthrosis, results in moderate spinal stenosis with mild-to-moderate impression on the spinal cord. There is also mild right and moderate left neural foraminal stenosis. No discrete paravertebral fluid collection is identified on this unenhanced study.  Vertebral body heights in the lumbar spine are preserved. Multilevel disc desiccation is present, greatest from L3-4 to L5-S1. There is moderate to severe disc space narrowing at L5-S1, similar to the prior MRI. Mild degenerative marrow changes are present L5-S1 and. Conus medullaris is normal in signal and terminates at L1.  T11-12: Small right foraminal disc protrusion without significant stenosis.  T12-L1:  Negative.  L1-2:  Negative.  L2-3:  Negative.  L3-4: Mild disc bulging asymmetric to the left without significant stenosis, unchanged.  L4-5: Mild circumferential disc bulging, superimposed right central disc extrusion, and facet and ligamentum flavum hypertrophy results in moderate to severe right and moderate left lateral recess stenosis, mild-to-moderate spinal stenosis, and moderate right neural foraminal stenosis, similar to the prior study.  L5-S1: Prior right hemilaminectomy. Left central disc protrusion results in mild to moderate left  lateral recess stenosis, not significantly changed. No spinal  canal stenosis.  IMPRESSION: 1. Complete loss of disc space height at T10-11 with endplate erosion/depression and extensive surrounding marrow changes. There is circumferential displacement of material/phlegmon including into the ventral epidural space with resultant moderate spinal stenosis. These findings are new from 07/14/2013 chest radiographs and are concerning for discitis/osteomyelitis. Other potential considerations might include rapidly progressive degenerative disc disease and associated mild compression fractures or dialysis related amyloid spondyloarthropathy. 2. Unchanged moderate disc degeneration in the lower lumbar spine as above. These results were called by telephone at the time of interpretation on 04/21/2014 at 8:09 pm to [facility] , who verbally acknowledged these results.   Electronically Signed   By: Logan Bores   On: 04/21/2014 20:17   Dg Foot Complete Left  04/21/2014   CLINICAL DATA:  54 year old female with history of trauma from a fall 1 week ago with wound and pain in the left little toe.  EXAM: LEFT FOOT - COMPLETE 3+ VIEW  COMPARISON:  No priors.  FINDINGS: Three views of the left foot are slightly limited by extensive motion on the frontal projection. With these limitations in mind, there is no destructive lytic lesion in the left fifth toe to suggest osteomyelitis. No acute displaced fracture, subluxation or dislocation. Numerous vascular calcifications are noted. Small plantar calcaneal spur.  IMPRESSION: 1. No acute radiographic abnormality of the left foot. Specifically, no signs of osteomyelitis in the left fifth toe. 2. Atherosclerosis.   Electronically Signed   By: Vinnie Langton M.D.   On: 04/21/2014 17:29    ROS: As above. The patient denies neck pain.  Blood pressure 136/64, pulse 72, temperature 97.4 F (36.3 C), temperature source Oral, resp. rate 20, height 5' 1"  (1.549 m), weight 86.183 kg (190 lb), SpO2 95 %. Physical Exam  General: An alert and  pleasant 54 year old white female who appears comfortable and in no apparent distress.  HEENT: Normocephalic, atraumatic  Neck: Supple without masses or deformities. Her neck is obese. She has a mildly limited cervical range of motion. Spurling's testing was negative.  Thorax: Symmetric she has a left subclavian vascular catheter  Abdomen: Soft  Extremities: The patient has an ulceration in her left second and third toe she has some swelling and erythema in her foot.  Back exam: The patient is diffusely mildly tender. There is no deformities or point tenderness per se.  Neurologic exam: The patient is alert and oriented 3. Cranial nerves II through XII are grossly intact bilaterally. Her motor strength is 5 over 5 in her bilateral biceps, triceps, hand grip, psoas, quadriceps, gastrocnemius, dorsiflexors, although she occasionally gives away to pain. Sensory function is intact to light touch sensation all tested dermatomes bilaterally, including her trunk and legs. Cerebellar function is intact to rapid alternating movements of the upper extremities bilaterally.  I have reviewed the patient's lumbar MRI performed 04/21/2014 at [facility]. The sagittal images demonstrate the patient has some disc degeneration most prominent at L5-S1 with loss of disc space height and a central spur. At L4-5 she has a bulging disc. The patient has inflammatory changes at T10-11 with loss of the disc space margins. There is some low signal in the vertebral endplates. She appears to have some epidural inflammatory tissue. I don't see any drainable abscesses. She has moderate spinal stenosis at T10-11. She has had a right L5-S1 discectomy. She has a right central bulging disc at L4-5.  Assessment/Plan: T10-11 probable discitis/osteomyelitis: I have  discussed the situation with the ER staff. I have recommended the patient be worked up with a sedimentation rate, blood cultures, and possibly a needle aspiration  of the disc space. She should be started on empiric antibiotics, likely vancomycin and Rocephin. Given the complexity of her medical issues that would recommend an infectious disease consult and a renal consult to help with the antibiotics. I don't think she needs surgery presently as she does not appear to have a significant drainable abscess. I will continue to follow the patient.  Desera Graffeo D 04/21/2014, 9:25 PM

## 2014-04-21 NOTE — ED Notes (Addendum)
Pt missed her dialysis appt for today due to a fall while getting out of her bed.  Pt stated her knees felt weak and "buckled".  Pt has noticed increased weakness in the last month.  Pt states the weakness is due to degenerating disk disease in her back.  Bi lateral swelling in Lower Legs with 2 plus pitting.  Pt left foot swollen and wound visible on her pinky toe.  Pt states she is under MD care and on anti biotics for her left foot.  Pt is  Diabetic. Pt states she has cronic back pain and states she doesn't feel like she is in any greater pain since fall.

## 2014-04-21 NOTE — ED Provider Notes (Signed)
MSE was initiated and I personally evaluated the patient and placed orders (if any) at  2:30 PM on April 21, 2014.   Pt presented to the ED with complaints of weakness and a fall.  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  She saw Dr Gerlene FeeKritzer and was told surgery was not a good option.  Past few weeks her legs have felt weaker.  She has also had worsening back pain.  She was supposed to go to dialysis today.  She went to turn and her leg went our from under her and she fell backwards.  EMS was called and she was able to stand with assitance.  She tried to walk with EMS and fell again.  Getting treatment for cellulitis of her foot.   Exam: Alert, calm Car RRR Lungs CTA Abd soft nontender Musc : no ttp lumbar spine, left foot with erythema foot and erythema and ulceration left small toe Neuro: alert, nl grip strength, difficulty lifting both legs off the bed, sensation intact  Plan on labs.  Tx pain.   Reassess le weakness,  ?metabolic, ?spinal impingement  Linwood DibblesJon Cordai Rodrigue, MD 04/21/14 1446

## 2014-04-21 NOTE — ED Notes (Signed)
Pt placed on monitor upon arrival to room. Pt monitored by blood pressure and pulse ox.  

## 2014-04-21 NOTE — H&P (Signed)
[facility] Admission History and Physical       [name] ZOX:[id number] DOB: 1960-04-17 DOA: 04/21/2014  Referring physician: EDP PCP: Pearla Dubonnet, MD  Specialists:   Chief Complaint: Low Back Pain and Weakness both Legs  HPI: [name] is a 54 y.o. female with a history of ESRD on HD,  HTN, IDDM, Lumbar DDD who presents to the ED with complaints of progressive weakness over the past month in both legs and her knee buckling and precipitating a fall today.  She was not able to get up and was brought to the ED.   She also had an injury to her Left 5th Toe 1 month ago and she saw her PCP, and over the month she developed increased redness so her dialysis doctor placed over on IV Vancomycin to treat a cellulitis with her dialysis Rx 4 days ago.  In the ED tonight,  An X-Ray of the Left Foot was performed and was negative for fracture and findings of Osteomyelitis, and an MRI of the Lumbar Spine which revealed +diskitis/Osteomyelitis of the T10-T11 disk.   NeuroSurgery Dr. Tressie Stalker was consulted and patient was placed on IV Vancomycin and Rocephin and referred for medical admission.   Of Note: She missed her scheduled Dialysis Rx today due to her Fall.      Review of Systems:  Constitutional: No Weight Loss, No Weight Gain, Night Sweats, Fevers, Chills, Dizziness, Fatigue, or +Generalized Weakness HEENT: No Headaches, Difficulty Swallowing,Tooth/Dental Problems,Sore Throat,  No Sneezing, Rhinitis, Ear Ache, Nasal Congestion, or Post Nasal Drip,  Cardio-vascular:  No Chest pain, Orthopnea, PND, Edema in Lower Extremities, Anasarca, Dizziness, Palpitations  Resp: No Dyspnea, No DOE, No Productive Cough, No Non-Productive Cough, No Hemoptysis, No Wheezing.    GI: No Heartburn, Indigestion, Abdominal Pain, +Nausea, Vomiting, Diarrhea, Hematemesis, Hematochezia, Melena, Change in Bowel Habits,  Loss of Appetite  GU: No Dysuria, Change in Color of Urine, No  Urgency or Frequency, No Flank pain.  Musculoskeletal:   +Left Foot Pain and Swelling, No Decreased Range of Motion, No Back Pain.  Neurologic: No Syncope, No Seizures, +BLE  Weakness, Paresthesia, Vision Disturbance or Loss, No Diplopia, No Vertigo, No Difficulty Walking,  Skin: No Rash or Lesions. Psych: No Change in Mood or Affect, No Depression or Anxiety, No Memory loss, No Confusion, or Hallucinations   Past Medical History  Diagnosis Date  . Hypertension   . Anemia   . Diabetes mellitus     Type 2  . Chronic kidney disease     T,Th,Sa dialysis  . GERD (gastroesophageal reflux disease)     protonix for "burping"  . Complication of anesthesia     had trouble being put to sleep and then difficulty waking up. States she doesn't have any issues with Propofol      Past Surgical History  Procedure Laterality Date  . Insertion of dialysis catheter N/A 07/16/2013    Procedure: INSERTION OF DIALYSIS CATHETER;  Surgeon: Larina Earthly, MD;  Location: [facility] OR;  Service: Vascular;  Laterality: N/A;  . Av fistula placement Right 07/18/2013    Procedure: RIGHT ARTERIOVENOUS (AV) FISTULA CREATION;  Surgeon: Sherren Kerns, MD;  Location: [facility] OR;  Service: Vascular;  Laterality: Right;  . Insertion of dialysis catheter Left 12/22/2013    Procedure: INSERTION OF DIALYSIS CATHETER;  Surgeon: Larina Earthly, MD;  Location: [facility] OR;  Service: Vascular;  Laterality: Left;  . Back surgery  17 years    lumbar surgery  .  Eye surgery Bilateral     cataract surgery with lens implants  . Hysteroscopy w/ endometrial ablation    . Fistula superficialization Right 02/16/2014    Procedure:  RIGHT Fistula BRACHIOCEPHALIC Superficialization;  Surgeon: Chuck Hint, MD;  Location: [facility] OR;  Service: Vascular;  Laterality: Right;  . Fistulogram N/A 01/15/2014    Procedure: FISTULOGRAM;  Surgeon: Chuck Hint, MD;  Location: [facility] CATH LAB;  Service: Cardiovascular;  Laterality: N/A;       Prior to  Admission medications   Medication Sig Start Date End Date Taking? Authorizing Provider  atenolol (TENORMIN) 25 MG tablet Take 1 tablet (25 mg total) by mouth at bedtime. 07/26/13  Yes Marden Noble, MD  b complex-vitamin c-folic acid (NEPHRO-VITE) 0.8 MG TABS tablet Take 1 tablet by mouth at bedtime.   Yes Historical Provider, MD  clonazePAM (KLONOPIN) 0.5 MG tablet Take 0.5 mg by mouth at bedtime.   Yes Historical Provider, MD  famotidine (PEPCID) 20 MG tablet Take 20 mg by mouth at bedtime.    Yes Historical Provider, MD  HYDROcodone-acetaminophen (NORCO/VICODIN) 5-325 MG per tablet Take 1 tablet by mouth at bedtime as needed for moderate pain.    Yes Historical Provider, MD  ibuprofen (ADVIL,MOTRIN) 200 MG tablet Take 400-600 mg by mouth every 8 (eight) hours as needed for moderate pain.    Yes Historical Provider, MD  insulin aspart (NOVOLOG) 100 UNIT/ML injection Inject 0-12 Units into the skin 4 (four) times daily - after meals and at bedtime. Per sliding scale. Usually takes last dose around 10pm   Yes Historical Provider, MD  insulin detemir (LEVEMIR) 100 UNIT/ML injection Inject 40-42 Units into the skin at bedtime. Usually takes around midnight   Yes Historical Provider, MD  lanthanum (FOSRENOL) 1000 MG chewable tablet Chew 1,000 mg by mouth See admin instructions. Takes with meals and snacks   Yes Historical Provider, MD  linagliptin (TRADJENTA) 5 MG TABS tablet Take 1 tablet (5 mg total) by mouth daily. 07/26/13  Yes Marden Noble, MD  oxyCODONE-acetaminophen (ROXICET) 5-325 MG per tablet Take 1-2 tablets by mouth every 4 (four) hours as needed for severe pain. 02/16/14  Yes Chuck Hint, MD  pantoprazole (PROTONIX) 40 MG tablet Take 1 tablet (40 mg total) by mouth daily. 07/26/13  Yes Marden Noble, MD  promethazine (PHENERGAN) 25 MG tablet Take 1 tablet (25 mg total) by mouth every 6 (six) hours as needed for nausea or vomiting. 07/26/13  Yes Marden Noble, MD  Turmeric Curcumin 500 MG  CAPS Take 500 mg by mouth daily.   Yes Historical Provider, MD  zaleplon (SONATA) 10 MG capsule Take 10 mg by mouth at bedtime as needed for sleep.   Yes Historical Provider, MD  doxercalciferol (HECTOROL) 4 MCG/2ML injection Inject 2 mLs (4 mcg total) into the vein Every Tuesday,Thursday,and Saturday with dialysis. 07/26/13   Marden Noble, MD  ferric gluconate 125 mg in sodium chloride 0.9 % 100 mL Inject 125 mg into the vein Every Tuesday,Thursday,and Saturday with dialysis. 07/26/13   Marden Noble, MD  iron sucrose in sodium chloride 0.9 % 100 mL Inject 100 mg into the vein once a week. Receives at dialysis    Historical Provider, MD      Allergies  Allergen Reactions  . Codeine Other (See Comments)    hallucinations     Social History:  reports that she has never smoked. She has never used smokeless tobacco. She reports that she does not drink alcohol or use  illicit drugs.     Family History  Problem Relation Age of Onset  . Fibromyalgia Mother   . Ovarian cancer Mother   . Rectal cancer Mother   . Parkinson's disease Father        Physical Exam:  GEN:  Pleasant Obese 54 y.o. Caucasian female  examined  and in no acute distress; cooperative with exam Filed Vitals:   04/21/14 1715 04/21/14 2000 04/21/14 2030 04/21/14 2210  BP: 155/68 131/67 136/64 146/76  Pulse: 73 72 72 65  Temp:      TempSrc:      Resp: 20   18  Height:      Weight:      SpO2: 100% 93% 95% 99%   Blood pressure 146/76, pulse 65, temperature 97.4 F (36.3 C), temperature source Oral, resp. rate 18, height 5\' 1"  (1.549 m), weight 86.183 kg (190 lb), SpO2 99 %. PSYCH: She is alert and oriented x4; does not appear anxious does not appear depressed; affect is normal HEENT: Normocephalic and Atraumatic, Mucous membranes pink; PERRLA; EOM intact; Fundi:  Benign;  No scleral icterus, Nares: Patent, Oropharynx: Clear, Fair Dentition,    Neck:  FROM, No Cervical Lymphadenopathy nor Thyromegaly or Carotid Bruit;  No JVD; Breasts:: Not examined CHEST WALL: No tenderness CHEST: Normal respiration, clear to auscultation bilaterally HEART: Regular rate and rhythm; no murmurs rubs or gallops BACK: No kyphosis or scoliosis; No CVA tenderness ABDOMEN: Positive Bowel Sounds, Obese, Soft Non-Tender; No Masses, No Organomegaly. Rectal Exam: Not done EXTREMITIES: No Cyanosis, Clubbing, or Edema; No Ulcerations. Genitalia: not examined PULSES: 2+ and symmetric SKIN: Normal hydration no rash or ulceration CNS:  Alert and Oriented X4,  No focal Deficits Vascular: pulses palpable throughout    Labs on Admission:  Basic Metabolic Panel:  Recent Labs Lab 04/21/14 1455  NA 128*  K 4.6  CL 89*  CO2 21  GLUCOSE 460*  BUN 33*  CREATININE 6.44*  CALCIUM 10.0   Liver Function Tests:  Recent Labs Lab 04/21/14 1455  AST 28  ALT 6  ALKPHOS 106  BILITOT 1.4*  PROT 6.9  ALBUMIN 3.1*   No results for input(s): LIPASE, AMYLASE in the last 168 hours. No results for input(s): AMMONIA in the last 168 hours. CBC:  Recent Labs Lab 04/21/14 1455  WBC 10.7*  NEUTROABS 9.5*  HGB 10.6*  HCT 33.1*  MCV 86.0  PLT 315   Cardiac Enzymes: No results for input(s): CKTOTAL, CKMB, CKMBINDEX, TROPONINI in the last 168 hours.  BNP (last 3 results)  Recent Labs  07/10/13 1330  PROBNP 15005.0*   CBG: No results for input(s): GLUCAP in the last 168 hours.  Radiological Exams on Admission: Mr Lumbar Spine Wo Contrast  04/21/2014   CLINICAL DATA:  Worsening back pain and 1 month of bilateral leg weakness, worse today. Fall.  EXAM: MRI LUMBAR SPINE WITHOUT CONTRAST  TECHNIQUE: Multiplanar, multisequence MR imaging of the lumbar spine was performed. No intravenous contrast was administered.  COMPARISON:  Chest radiographs 07/14/2013. Lumbar spine MRI 05/13/2012.  FINDINGS: There is minimal right convex curvature at the thoracolumbar junction. There is no significant listhesis. The visualized portion of the  lower thoracic spine demonstrates complete loss of disc space height at T10-11 which is new from prior chest radiographs and was not included on the prior MRI. There are extensive adjacent marrow changes in the T10 and T11 vertebral bodies, predominantly intermediate to low T1 signal intensity and mixed T2 hypo- and hyperintensity. STIR images  demonstrate mild edema along the endplates. There does not appear to be a large amount of fluid signal in the disc space, although evaluation is somewhat limited by the essentially complete disc space height loss. There is mild anterior wedging of the T10 vertebral body due to superior endplate compression/ resorption. There is also mild depression versus erosion of the T11 superior endplate. There is circumferential displacement of disc material/bone/other soft tissue at the T10-11 disc space level including into the ventral spinal canal. Material in the ventral epidural space measures 6 mm in AP thickness and, together with facet arthrosis, results in moderate spinal stenosis with mild-to-moderate impression on the spinal cord. There is also mild right and moderate left neural foraminal stenosis. No discrete paravertebral fluid collection is identified on this unenhanced study.  Vertebral body heights in the lumbar spine are preserved. Multilevel disc desiccation is present, greatest from L3-4 to L5-S1. There is moderate to severe disc space narrowing at L5-S1, similar to the prior MRI. Mild degenerative marrow changes are present L5-S1 and. Conus medullaris is normal in signal and terminates at L1.  T11-12: Small right foraminal disc protrusion without significant stenosis.  T12-L1:  Negative.  L1-2:  Negative.  L2-3:  Negative.  L3-4: Mild disc bulging asymmetric to the left without significant stenosis, unchanged.  L4-5: Mild circumferential disc bulging, superimposed right central disc extrusion, and facet and ligamentum flavum hypertrophy results in moderate to severe  right and moderate left lateral recess stenosis, mild-to-moderate spinal stenosis, and moderate right neural foraminal stenosis, similar to the prior study.  L5-S1: Prior right hemilaminectomy. Left central disc protrusion results in mild to moderate left lateral recess stenosis, not significantly changed. No spinal canal stenosis.  IMPRESSION: 1. Complete loss of disc space height at T10-11 with endplate erosion/depression and extensive surrounding marrow changes. There is circumferential displacement of material/phlegmon including into the ventral epidural space with resultant moderate spinal stenosis. These findings are new from 07/14/2013 chest radiographs and are concerning for discitis/osteomyelitis. Other potential considerations might include rapidly progressive degenerative disc disease and associated mild compression fractures or dialysis related amyloid spondyloarthropathy. 2. Unchanged moderate disc degeneration in the lower lumbar spine as above. These results were called by telephone at the time of interpretation on 04/21/2014 at 8:09 pm to [facility] , who verbally acknowledged these results.   Electronically Signed   By: Sebastian Ache   On: 04/21/2014 20:17   Dg Foot Complete Left  04/21/2014   CLINICAL DATA:  54 year old female with history of trauma from a fall 1 week ago with wound and pain in the left little toe.  EXAM: LEFT FOOT - COMPLETE 3+ VIEW  COMPARISON:  No priors.  FINDINGS: Three views of the left foot are slightly limited by extensive motion on the frontal projection. With these limitations in mind, there is no destructive lytic lesion in the left fifth toe to suggest osteomyelitis. No acute displaced fracture, subluxation or dislocation. Numerous vascular calcifications are noted. Small plantar calcaneal spur.  IMPRESSION: 1. No acute radiographic abnormality of the left foot. Specifically, no signs of osteomyelitis in the left fifth toe. 2. Atherosclerosis.   Electronically  Signed   By: Trudie Reed M.D.   On: 04/21/2014 17:29      Assessment/Plan:   54 y.o. female with  Principal Problem:   1.    Diskitis- on MRI   NeuroSurgery Consulted: Dr. Lovell Sheehan   IV Vancomycin and Rocephin   Active Problems:   2.   Cellulitis and  abscess of toe   Covered By Antiibiotics- Vancomycin   Wound Care Consult ordered      3.   ESRD on dialysis   Notify dialysis in AM to continue Dialysis      4.   Diabetes mellitus   Continue Levemir Rx   SSI coverage   Check HbA1C    5.   Malignant hypertension   Continue Atenolol   Monitor BPs      6.    Hyponatremia   Correct with Dialysis     7.    Anemia of Renal Disease   Continue [place]o-vite   Monitor H/Hs   8.    Leukocytosis   Due to #1 and #2   Monitor Trend    9.   DVT Prophylaxis   SCDs     Code Status:   FULL CODE    Family Communication:   No Family Present Disposition Plan:    Inpatient  Med/Surg Unit   Time spent:  160 Minutes  Ron ParkerJENKINS,Heide Brossart C [facility] Pager [phone]   If 7AM -7PM Please Contact the Day Rounding Team MD for [facility]  If 7PM-7AM, Please Contact Night-Floor Coverage  [email or web address] Password [email or web address] 04/21/2014, 10:14 PM

## 2014-04-21 NOTE — ED Notes (Signed)
Pt states she was on her way to dialysis and her knees "buckeled and feel flat on back."  Denies LOC or hitting head.  Last dialysis was Thursday.  HX diabetes, HTN, kidney failure.  Pt states she has had several falls in the last month.

## 2014-04-21 NOTE — ED Notes (Signed)
PA at bedside.

## 2014-04-21 NOTE — ED Notes (Signed)
Pt remains monitored by blood pressure and pulse ox. pts family remains at bedside.  

## 2014-04-21 NOTE — ED Provider Notes (Signed)
CSN: [id number]     Arrival date & time 04/21/14  1337 History   First MD Initiated Contact with Patient 04/21/14 1424     Chief Complaint  Patient presents with  . Fall  . Extremity Weakness     (Consider location/radiation/quality/duration/timing/severity/associated sxs/prior Treatment) Patient is a 54 y.o. female presenting with fall and extremity weakness. The history is provided by the patient and medical records. No language interpreter was used.  Fall Associated symptoms include weakness. Pertinent negatives include no abdominal pain, chest pain, coughing, diaphoresis, fatigue, fever, headaches, joint swelling, nausea, neck pain, numbness, rash or vomiting.  Extremity Weakness Associated symptoms include weakness. Pertinent negatives include no abdominal pain, chest pain, coughing, diaphoresis, fatigue, fever, headaches, joint swelling, nausea, neck pain, numbness, rash or vomiting.     [name] is a 54 y.o. female  with a hx of HTN, anemia, IDDM, ESRD on dialysis (T, Th, Sat), GERD presents to the Emergency Department complaining of gradual, persistent, progressively worsening weakness in the bilateral legs resulting in a fall this morning. Patient reports that after the fall she was unable to get up from the floor without assistance. She denies hitting her head or loss of consciousness. Patient reports that approximately 1.5 years ago she saw Dr Gerlene Fee about worsening pain in her back who recommended surgery but she opted to go with conservative medical management instead. She reports that this summer she was gardening and active as usual however she's had worsening weakness and "pressure" in her lower back since the late fall. Patient reports this has worsened in the last week and this morning after getting up to go to dialysis she fell. She reports that her legs remain weak and she is unable to walk at this time.  Patient reports persistent pressure in her low back but denies  saddle anesthesia, loss of bowel or bladder control. She does report "numbness in her legs" but she has unable to clarify this as she reports she can feel sensation. She reports that she is being treated for cellulitis of her left little toe and has received 2 doses of IV antibiotics on Tuesday and Thursday of this week. She denies fevers or chills at home, nausea or vomiting, abdominal pain, chest pain, shortness of breath, syncope, dysuria or hematuria.    Past Medical History  Diagnosis Date  . Hypertension   . Anemia   . Diabetes mellitus     Type 2  . Chronic kidney disease     T,Th,Sa dialysis  . GERD (gastroesophageal reflux disease)     protonix for "burping"  . Complication of anesthesia     had trouble being put to sleep and then difficulty waking up. States she doesn't have any issues with Propofol   Past Surgical History  Procedure Laterality Date  . Insertion of dialysis catheter N/A 07/16/2013    Procedure: INSERTION OF DIALYSIS CATHETER;  Surgeon: Larina Earthly, MD;  Location: [facility] OR;  Service: Vascular;  Laterality: N/A;  . Av fistula placement Right 07/18/2013    Procedure: RIGHT ARTERIOVENOUS (AV) FISTULA CREATION;  Surgeon: Sherren Kerns, MD;  Location: [facility] OR;  Service: Vascular;  Laterality: Right;  . Insertion of dialysis catheter Left 12/22/2013    Procedure: INSERTION OF DIALYSIS CATHETER;  Surgeon: Larina Earthly, MD;  Location: [facility] OR;  Service: Vascular;  Laterality: Left;  . Back surgery  17 years    lumbar surgery  . Eye surgery Bilateral     cataract surgery  with lens implants  . Hysteroscopy w/ endometrial ablation    . Fistula superficialization Right 02/16/2014    Procedure:  RIGHT Fistula BRACHIOCEPHALIC Superficialization;  Surgeon: Chuck Hint, MD;  Location: [facility] OR;  Service: Vascular;  Laterality: Right;  . Fistulogram N/A 01/15/2014    Procedure: FISTULOGRAM;  Surgeon: Chuck Hint, MD;  Location: [facility] CATH LAB;  Service: Cardiovascular;   Laterality: N/A;   Family History  Problem Relation Age of Onset  . Fibromyalgia Mother   . Ovarian cancer Mother   . Rectal cancer Mother   . Parkinson's disease Father    History  Substance Use Topics  . Smoking status: Never Smoker   . Smokeless tobacco: Never Used  . Alcohol Use: No   OB History    No data available     Review of Systems  Constitutional: Negative for fever, diaphoresis, appetite change, fatigue and unexpected weight change.  HENT: Negative for mouth sores.   Eyes: Negative for visual disturbance.  Respiratory: Negative for cough, chest tightness, shortness of breath and wheezing.   Cardiovascular: Negative for chest pain.  Gastrointestinal: Negative for nausea, vomiting, abdominal pain, diarrhea and constipation.  Endocrine: Negative for polydipsia, polyphagia and polyuria.  Genitourinary: Negative for dysuria, urgency, frequency and hematuria.  Musculoskeletal: Positive for back pain, gait problem (Unable to ambulate) and extremity weakness. Negative for joint swelling, neck pain and neck stiffness.  Skin: Positive for color change and wound. Negative for rash.  Allergic/Immunologic: Negative for immunocompromised state.  Neurological: Positive for weakness. Negative for syncope, light-headedness, numbness and headaches.  Hematological: Does not bruise/bleed easily.  Psychiatric/Behavioral: Negative for sleep disturbance. The patient is not nervous/anxious.   All other systems reviewed and are negative.     Allergies  Codeine  Home Medications   Prior to Admission medications   Medication Sig Start Date End Date Taking? Authorizing Provider  atenolol (TENORMIN) 25 MG tablet Take 1 tablet (25 mg total) by mouth at bedtime. 07/26/13  Yes Marden Noble, MD  b complex-vitamin c-folic acid (NEPHRO-VITE) 0.8 MG TABS tablet Take 1 tablet by mouth at bedtime.   Yes Historical Provider, MD  clonazePAM (KLONOPIN) 0.5 MG tablet Take 0.5 mg by mouth at bedtime.    Yes Historical Provider, MD  famotidine (PEPCID) 20 MG tablet Take 20 mg by mouth at bedtime.    Yes Historical Provider, MD  HYDROcodone-acetaminophen (NORCO/VICODIN) 5-325 MG per tablet Take 1 tablet by mouth at bedtime as needed for moderate pain.    Yes Historical Provider, MD  ibuprofen (ADVIL,MOTRIN) 200 MG tablet Take 400-600 mg by mouth every 8 (eight) hours as needed for moderate pain.    Yes Historical Provider, MD  insulin aspart (NOVOLOG) 100 UNIT/ML injection Inject 0-12 Units into the skin 4 (four) times daily - after meals and at bedtime. Per sliding scale. Usually takes last dose around 10pm   Yes Historical Provider, MD  insulin detemir (LEVEMIR) 100 UNIT/ML injection Inject 40-42 Units into the skin at bedtime. Usually takes around midnight   Yes Historical Provider, MD  lanthanum (FOSRENOL) 1000 MG chewable tablet Chew 1,000 mg by mouth See admin instructions. Takes with meals and snacks   Yes Historical Provider, MD  linagliptin (TRADJENTA) 5 MG TABS tablet Take 1 tablet (5 mg total) by mouth daily. 07/26/13  Yes Marden Noble, MD  oxyCODONE-acetaminophen (ROXICET) 5-325 MG per tablet Take 1-2 tablets by mouth every 4 (four) hours as needed for severe pain. 02/16/14  Yes Chuck Hint, MD  pantoprazole (PROTONIX) 40 MG tablet Take 1 tablet (40 mg total) by mouth daily. 07/26/13  Yes Marden Noble, MD  promethazine (PHENERGAN) 25 MG tablet Take 1 tablet (25 mg total) by mouth every 6 (six) hours as needed for nausea or vomiting. 07/26/13  Yes Marden Noble, MD  Turmeric Curcumin 500 MG CAPS Take 500 mg by mouth daily.   Yes Historical Provider, MD  zaleplon (SONATA) 10 MG capsule Take 10 mg by mouth at bedtime as needed for sleep.   Yes Historical Provider, MD  doxercalciferol (HECTOROL) 4 MCG/2ML injection Inject 2 mLs (4 mcg total) into the vein Every Tuesday,Thursday,and Saturday with dialysis. 07/26/13   Marden Noble, MD  ferric gluconate 125 mg in sodium chloride 0.9 % 100 mL  Inject 125 mg into the vein Every Tuesday,Thursday,and Saturday with dialysis. 07/26/13   Marden Noble, MD  iron sucrose in sodium chloride 0.9 % 100 mL Inject 100 mg into the vein once a week. Receives at dialysis    Historical Provider, MD   BP 152/57 mmHg  Pulse 64  Temp(Src) 98.2 F (36.8 C) (Oral)  Resp 18  Ht 5\' 1"  (1.549 m)  Wt 190 lb (86.183 kg)  BMI 35.92 kg/m2  SpO2 100% Physical Exam  Constitutional: She is oriented to person, place, and time. She appears well-developed and well-nourished. No distress.  Awake, alert, nontoxic appearance  HENT:  Head: Normocephalic and atraumatic.  Mouth/Throat: Oropharynx is clear and moist. No oropharyngeal exudate.  No contusions to the head, no lacerations  Eyes: Conjunctivae are normal. No scleral icterus.  Neck: Normal range of motion. Neck supple.  Full ROM without pain  Cardiovascular: Normal rate, regular rhythm, normal heart sounds and intact distal pulses.   Pulmonary/Chest: Effort normal and breath sounds normal. No respiratory distress. She has no wheezes.  Equal chest expansion  Abdominal: Soft. Bowel sounds are normal. She exhibits no distension and no mass. There is no tenderness. There is no rebound and no guarding.  Obesity Soft and nontender  Musculoskeletal: Normal range of motion. She exhibits no edema.  Full range of motion of the T-spine and L-spine No tenderness to palpation of the spinous processes of the T-spine or L-spine No tenderness to palpation of the paraspinous muscles of the L-spine  Lymphadenopathy:    She has no cervical adenopathy.  Neurological: She is alert and oriented to person, place, and time. She has normal reflexes.  Reflex Scores:      Bicep reflexes are 2+ on the right side and 2+ on the left side.      Brachioradialis reflexes are 2+ on the right side and 2+ on the left side.      Patellar reflexes are 2+ on the right side and 2+ on the left side.      Achilles reflexes are 2+ on the  right side and 2+ on the left side. Speech is clear and goal oriented, follows commands Normal 5/5 strength in upper extremities bilaterally including strong and equal grip strength Normal 2/5 strength in lower extremities bilaterally with 5/5 dorsiflexion and plantar flexion bilaterally; pt is unable to fully lift her legs off the bef  Sensation normal to light and sharp touch Moves extremities without ataxia, coordination intact Gait not tested No Clonus   Skin: Skin is warm and dry. No rash noted. She is not diaphoretic. There is erythema.  Left little toe with erythema and streaking of redness and warmth into the forefoot.    Psychiatric: She has a  normal mood and affect. Her behavior is normal.  Nursing note and vitals reviewed.   ED Course  Procedures (including critical care time) Labs Review Labs Reviewed  CBC - Abnormal; Notable for the following:    WBC 10.7 (*)    RBC 3.85 (*)    Hemoglobin 10.6 (*)    HCT 33.1 (*)    RDW 17.7 (*)    All other components within normal limits  DIFFERENTIAL - Abnormal; Notable for the following:    Neutrophils Relative % 88 (*)    Neutro Abs 9.5 (*)    Lymphocytes Relative 6 (*)    Lymphs Abs 0.6 (*)    All other components within normal limits  COMPREHENSIVE METABOLIC PANEL - Abnormal; Notable for the following:    Sodium 128 (*)    Chloride 89 (*)    Glucose, Bld 460 (*)    BUN 33 (*)    Creatinine, Ser 6.44 (*)    Albumin 3.1 (*)    Total Bilirubin 1.4 (*)    GFR calc non Af Amer 7 (*)    GFR calc Af Amer 8 (*)    Anion gap 18 (*)    All other components within normal limits  SEDIMENTATION RATE - Abnormal; Notable for the following:    Sed Rate 60 (*)    All other components within normal limits  GLUCOSE, CAPILLARY - Abnormal; Notable for the following:    Glucose-Capillary 427 (*)    All other components within normal limits  CULTURE, BLOOD (ROUTINE X 2)  CULTURE, BLOOD (ROUTINE X 2)  APTT  VANCOMYCIN, RANDOM  BASIC  METABOLIC PANEL  CBC  I-STAT TROPOININ, ED  I-STAT CG4 LACTIC ACID, ED    Imaging Review Mr Lumbar Spine Wo Contrast  04/21/2014   CLINICAL DATA:  Worsening back pain and 1 month of bilateral leg weakness, worse today. Fall.  EXAM: MRI LUMBAR SPINE WITHOUT CONTRAST  TECHNIQUE: Multiplanar, multisequence MR imaging of the lumbar spine was performed. No intravenous contrast was administered.  COMPARISON:  Chest radiographs 07/14/2013. Lumbar spine MRI 05/13/2012.  FINDINGS: There is minimal right convex curvature at the thoracolumbar junction. There is no significant listhesis. The visualized portion of the lower thoracic spine demonstrates complete loss of disc space height at T10-11 which is new from prior chest radiographs and was not included on the prior MRI. There are extensive adjacent marrow changes in the T10 and T11 vertebral bodies, predominantly intermediate to low T1 signal intensity and mixed T2 hypo- and hyperintensity. STIR images demonstrate mild edema along the endplates. There does not appear to be a large amount of fluid signal in the disc space, although evaluation is somewhat limited by the essentially complete disc space height loss. There is mild anterior wedging of the T10 vertebral body due to superior endplate compression/ resorption. There is also mild depression versus erosion of the T11 superior endplate. There is circumferential displacement of disc material/bone/other soft tissue at the T10-11 disc space level including into the ventral spinal canal. Material in the ventral epidural space measures 6 mm in AP thickness and, together with facet arthrosis, results in moderate spinal stenosis with mild-to-moderate impression on the spinal cord. There is also mild right and moderate left neural foraminal stenosis. No discrete paravertebral fluid collection is identified on this unenhanced study.  Vertebral body heights in the lumbar spine are preserved. Multilevel disc desiccation is  present, greatest from L3-4 to L5-S1. There is moderate to severe disc space narrowing at  L5-S1, similar to the prior MRI. Mild degenerative marrow changes are present L5-S1 and. Conus medullaris is normal in signal and terminates at L1.  T11-12: Small right foraminal disc protrusion without significant stenosis.  T12-L1:  Negative.  L1-2:  Negative.  L2-3:  Negative.  L3-4: Mild disc bulging asymmetric to the left without significant stenosis, unchanged.  L4-5: Mild circumferential disc bulging, superimposed right central disc extrusion, and facet and ligamentum flavum hypertrophy results in moderate to severe right and moderate left lateral recess stenosis, mild-to-moderate spinal stenosis, and moderate right neural foraminal stenosis, similar to the prior study.  L5-S1: Prior right hemilaminectomy. Left central disc protrusion results in mild to moderate left lateral recess stenosis, not significantly changed. No spinal canal stenosis.  IMPRESSION: 1. Complete loss of disc space height at T10-11 with endplate erosion/depression and extensive surrounding marrow changes. There is circumferential displacement of material/phlegmon including into the ventral epidural space with resultant moderate spinal stenosis. These findings are new from 07/14/2013 chest radiographs and are concerning for discitis/osteomyelitis. Other potential considerations might include rapidly progressive degenerative disc disease and associated mild compression fractures or dialysis related amyloid spondyloarthropathy. 2. Unchanged moderate disc degeneration in the lower lumbar spine as above. These results were called by telephone at the time of interpretation on 04/21/2014 at 8:09 pm to [facility] , who verbally acknowledged these results.   Electronically Signed   By: Sebastian Ache   On: 04/21/2014 20:17   Dg Foot Complete Left  04/21/2014   CLINICAL DATA:  54 year old female with history of trauma from a fall 1 week ago with wound  and pain in the left little toe.  EXAM: LEFT FOOT - COMPLETE 3+ VIEW  COMPARISON:  No priors.  FINDINGS: Three views of the left foot are slightly limited by extensive motion on the frontal projection. With these limitations in mind, there is no destructive lytic lesion in the left fifth toe to suggest osteomyelitis. No acute displaced fracture, subluxation or dislocation. Numerous vascular calcifications are noted. Small plantar calcaneal spur.  IMPRESSION: 1. No acute radiographic abnormality of the left foot. Specifically, no signs of osteomyelitis in the left fifth toe. 2. Atherosclerosis.   Electronically Signed   By: Trudie Reed M.D.   On: 04/21/2014 17:29     EKG Interpretation None      MDM   Final diagnoses:  Fall  Leg weakness  Toe infection  Discitis of thoracic region   [name] presents with bilateral leg weakness after fall today. Labs largely reassuring however patient does have mild leukocytosis of 10.7. Elevated neutrophils, both likely secondary to her left foot cellulitis. Patient with elevated BUN/creatinine and creatinine to previous baselines and patient has not had dialysis today. Mild hyponatremia at 128 and likely secondary to her hyperglycemia with a glucose of 460. Foot x-ray without evidence of osteomyelitis. Normal troponin. No electrolyte disturbance to suggest her persistent weakness.  Will obtain MRI L-spine.  9:18 PM Discussed MRI findings with Dr. Mosetta Putt of radiology and Dr. Lovell Sheehan of neurosurgery.  Patient with complete obliteration of T10-T11 disc space when compared to a chest x-ray in April 2015.  Question possible discitis or osteomyelitis however potentially patient with amyloidosis or rapid degenerative disc disease with Morrow changes. Regardless patient with spinal stenosis and mild spinal canal impingement with neurologic findings a weakness in her legs.  Blood cultures and sedimentation rate pending. Dr. Lovell Sheehan will evaluate.  Patient  will need to call admission for treatment of the cellulitis in  her left little toe and treatment for discitis. Patient without evidence of sepsis.  Blood cultures pending. Patient without tachycardia or fever.   The patient was discussed with and seen by Dr. Rubin PayorPickering who agrees with the treatment plan.  [facility] Lov[facility] Sheehan[facility].  BP 152/57 mmHg  Pulse 64  Temp(Src) 98.2 F (36.8 C) (Oral)  Resp 18  Ht 5\' 1"  (1.549 m)  Wt 190 lb (86.183 kg)  BMI 35.92 kg/m2  SpO2 100%    Dierdre ForthHannah Kaedan Richert, PA-C 04/22/14 0128

## 2014-04-21 NOTE — ED Notes (Signed)
Pt off unit with MRI 

## 2014-04-21 NOTE — ED Notes (Signed)
Pt returned to room from xray.

## 2014-04-21 NOTE — ED Notes (Signed)
Pt off unit with xray 

## 2014-04-21 NOTE — Progress Notes (Addendum)
ANTIBIOTIC CONSULT NOTE - INITIAL  Pharmacy Consult for Vancomycin/Rocephin Indication: r/o discitis or osteomyelitis   Allergies  Allergen Reactions  . Codeine Other (See Comments)    hallucinations   Patient Measurements: Height: 5\' 1"  (154.9 cm) Weight: 190 lb (86.183 kg) IBW/kg (Calculated) : 47.8  Vital Signs: Temp: 97.4 F (36.3 C) (01/09 1443) Temp Source: Oral (01/09 1344) BP: 136/64 mmHg (01/09 2030) Pulse Rate: 72 (01/09 2030)  Labs:  Recent Labs  04/21/14 1455  WBC 10.7*  HGB 10.6*  PLT 315  CREATININE 6.44*   Estimated Creatinine Clearance: 10 mL/min (by C-G formula based on Cr of 6.44).  Microbiology: No results found for this or any previous visit (from the past 720 hour(s)).  Medical History: Past Medical History  Diagnosis Date  . Hypertension   . Anemia   . Diabetes mellitus     Type 2  . Chronic kidney disease     T,Th,Sa dialysis  . GERD (gastroesophageal reflux disease)     protonix for "burping"  . Complication of anesthesia     had trouble being put to sleep and then difficulty waking up. States she doesn't have any issues with Propofol    Medications:  Anti-infectives    None     Assessment: 54 yo F admitted with c/o back pain, weakness and a fall.  She has multiple medical probles and we have been asked to assist with dosing of her IV Vancomycin and Rocephin.  She is a chronic HD patient on TTHS and states she received IV antibiotics with HD this week.  It is unclear what she has received.  Goal of Therapy:  Vancomycin trough level 15-20 mcg/ml  Plan:  - Obtain random Vanc. Level with AM labs and then redose if trough is < 6615mcg/ml. - Begin IV Ceftriaxone 2gm every 12 hours - Vancomycin 1 gm IV with each HD - Monitor clinical response - F/U steady state levels.  Nadara MustardNita Emberlin Verner, PharmD., MS Clinical Pharmacist Pager:  [phone] Thank you for allowing pharmacy to be part of this patients care team. 04/21/2014,9:45 PM

## 2014-04-22 DIAGNOSIS — I441 Atrioventricular block, second degree: Secondary | ICD-10-CM

## 2014-04-22 DIAGNOSIS — B9689 Other specified bacterial agents as the cause of diseases classified elsewhere: Secondary | ICD-10-CM

## 2014-04-22 DIAGNOSIS — Z794 Long term (current) use of insulin: Secondary | ICD-10-CM

## 2014-04-22 LAB — CBC
HCT: 33.2 % — ABNORMAL LOW (ref 36.0–46.0)
Hemoglobin: 10.3 g/dL — ABNORMAL LOW (ref 12.0–15.0)
MCH: 26.8 pg (ref 26.0–34.0)
MCHC: 31 g/dL (ref 30.0–36.0)
MCV: 86.5 fL (ref 78.0–100.0)
PLATELETS: 342 10*3/uL (ref 150–400)
RBC: 3.84 MIL/uL — ABNORMAL LOW (ref 3.87–5.11)
RDW: 18.1 % — ABNORMAL HIGH (ref 11.5–15.5)
WBC: 10.1 10*3/uL (ref 4.0–10.5)

## 2014-04-22 LAB — VANCOMYCIN, RANDOM: Vancomycin Rm: <5 ug/mL

## 2014-04-22 LAB — BASIC METABOLIC PANEL
Anion gap: 19 — ABNORMAL HIGH (ref 5–15)
BUN: 40 mg/dL — AB (ref 6–23)
CHLORIDE: 90 meq/L — AB (ref 96–112)
CO2: 21 mmol/L (ref 19–32)
Calcium: 9.8 mg/dL (ref 8.4–10.5)
Creatinine, Ser: 7.23 mg/dL — ABNORMAL HIGH (ref 0.50–1.10)
GFR calc Af Amer: 7 mL/min — ABNORMAL LOW (ref >90)
GFR calc non Af Amer: 6 mL/min — ABNORMAL LOW (ref >90)
GLUCOSE: 361 mg/dL — AB (ref 70–99)
Potassium: 5 mmol/L (ref 3.5–5.1)
Sodium: 130 mmol/L — ABNORMAL LOW (ref 135–145)

## 2014-04-22 LAB — GLUCOSE, CAPILLARY
GLUCOSE-CAPILLARY: 221 mg/dL — AB (ref 70–99)
GLUCOSE-CAPILLARY: 105 mg/dL — AB (ref 70–99)
Glucose-Capillary: 329 mg/dL — ABNORMAL HIGH (ref 70–99)
Glucose-Capillary: 319 mg/dL — ABNORMAL HIGH (ref 70–99)

## 2014-04-22 LAB — TROPONIN I: Troponin I: 0.08 ng/mL — ABNORMAL HIGH (ref <0.031)

## 2014-04-22 MED ORDER — LINAGLIPTIN 5 MG PO TABS
5.0000 mg | ORAL_TABLET | Freq: Every day | ORAL | Status: DC
  Administered 2014-04-22 – 2014-05-06 (×15): 5 mg via ORAL
  Filled 2014-04-22 (×17): qty 1

## 2014-04-22 MED ORDER — ATENOLOL 12.5 MG HALF TABLET
12.5000 mg | ORAL_TABLET | Freq: Every day | ORAL | Status: DC
  Filled 2014-04-22: qty 1

## 2014-04-22 MED ORDER — VANCOMYCIN HCL 10 G IV SOLR
1750.0000 mg | Freq: Once | INTRAVENOUS | Status: DC
  Filled 2014-04-22: qty 1750

## 2014-04-22 MED ORDER — INSULIN ASPART 100 UNIT/ML ~~LOC~~ SOLN
0.0000 [IU] | Freq: Three times a day (TID) | SUBCUTANEOUS | Status: DC
  Administered 2014-04-22: 7 [IU] via SUBCUTANEOUS
  Administered 2014-04-22: 3 [IU] via SUBCUTANEOUS
  Administered 2014-04-24: 5 [IU] via SUBCUTANEOUS
  Administered 2014-04-24: 2 [IU] via SUBCUTANEOUS
  Administered 2014-04-25: 3 [IU] via SUBCUTANEOUS
  Administered 2014-04-27: 5 [IU] via SUBCUTANEOUS
  Administered 2014-04-29: 2 [IU] via SUBCUTANEOUS
  Administered 2014-04-30 – 2014-05-02 (×2): 1 [IU] via SUBCUTANEOUS
  Administered 2014-05-02: 2 [IU] via SUBCUTANEOUS
  Administered 2014-05-02: 3 [IU] via SUBCUTANEOUS
  Administered 2014-05-03: 5 [IU] via SUBCUTANEOUS
  Administered 2014-05-03: 7 [IU] via SUBCUTANEOUS
  Administered 2014-05-04 – 2014-05-05 (×2): 2 [IU] via SUBCUTANEOUS
  Administered 2014-05-05: 5 [IU] via SUBCUTANEOUS
  Administered 2014-05-06: 9 [IU] via SUBCUTANEOUS

## 2014-04-22 MED ORDER — INSULIN ASPART 100 UNIT/ML ~~LOC~~ SOLN
8.0000 [IU] | Freq: Once | SUBCUTANEOUS | Status: AC
  Administered 2014-04-22: 8 [IU] via SUBCUTANEOUS

## 2014-04-22 MED ORDER — INSULIN ASPART 100 UNIT/ML ~~LOC~~ SOLN
0.0000 [IU] | Freq: Every day | SUBCUTANEOUS | Status: DC
  Administered 2014-04-24: 3 [IU] via SUBCUTANEOUS
  Administered 2014-04-27: 4 [IU] via SUBCUTANEOUS
  Administered 2014-04-29: 2 [IU] via SUBCUTANEOUS

## 2014-04-22 NOTE — Progress Notes (Signed)
Dr. Elvera LennoxGherghe aware of pt's HR dropping in the 40's-50's with telemetry calling d/t showing s/s of first degree HR block. Dr. Elvera LennoxGherghe verbalized he would observe strips saved by telemetry.

## 2014-04-22 NOTE — Progress Notes (Signed)
Patient ID: [name], female   DOB: 07-23-1960, 54 y.o.   MRN: [id number] Subjective:  The patient is somnolent. She says she did not get any sleep last night. She is in no apparent distress.  Objective: Vital signs in last 24 hours: Temp:  [97.4 F (36.3 C)-98.2 F (36.8 C)] 97.6 F (36.4 C) (01/10 0644) Pulse Rate:  [63-80] 63 (01/10 0644) Resp:  [11-22] 16 (01/10 0951) BP: (128-190)/(53-113) 128/68 mmHg (01/10 0644) SpO2:  [84 %-100 %] 97 % (01/10 0951) Weight:  [86.183 kg (190 lb)] 86.183 kg (190 lb) (01/09 1344)  Intake/Output from previous day: 01/09 0701 - 01/10 0700 In: 240 [P.O.:240] Out: -  Intake/Output this shift: Total I/O In: 465 [P.O.:462; I.V.:3] Out: -   Physical exam the patient is a little somnolent but easily arousable. She is oriented 3. Her strength is grossly normal and her bilateral lower extremities.  Lab Results:  Recent Labs  04/21/14 1455 04/22/14 0505  WBC 10.7* 10.1  HGB 10.6* 10.3*  HCT 33.1* 33.2*  PLT 315 342   BMET  Recent Labs  04/21/14 1455 04/22/14 0505  NA 128* 130*  K 4.6 5.0  CL 89* 90*  CO2 21 21  GLUCOSE 460* 361*  BUN 33* 40*  CREATININE 6.44* 7.23*  CALCIUM 10.0 9.8    Studies/Results: Mr Lumbar Spine Wo Contrast  04/21/2014   CLINICAL DATA:  Worsening back pain and 1 month of bilateral leg weakness, worse today. Fall.  EXAM: MRI LUMBAR SPINE WITHOUT CONTRAST  TECHNIQUE: Multiplanar, multisequence MR imaging of the lumbar spine was performed. No intravenous contrast was administered.  COMPARISON:  Chest radiographs 07/14/2013. Lumbar spine MRI 05/13/2012.  FINDINGS: There is minimal right convex curvature at the thoracolumbar junction. There is no significant listhesis. The visualized portion of the lower thoracic spine demonstrates complete loss of disc space height at T10-11 which is new from prior chest radiographs and was not included on the prior MRI. There are extensive adjacent marrow changes in the T10  and T11 vertebral bodies, predominantly intermediate to low T1 signal intensity and mixed T2 hypo- and hyperintensity. STIR images demonstrate mild edema along the endplates. There does not appear to be a large amount of fluid signal in the disc space, although evaluation is somewhat limited by the essentially complete disc space height loss. There is mild anterior wedging of the T10 vertebral body due to superior endplate compression/ resorption. There is also mild depression versus erosion of the T11 superior endplate. There is circumferential displacement of disc material/bone/other soft tissue at the T10-11 disc space level including into the ventral spinal canal. Material in the ventral epidural space measures 6 mm in AP thickness and, together with facet arthrosis, results in moderate spinal stenosis with mild-to-moderate impression on the spinal cord. There is also mild right and moderate left neural foraminal stenosis. No discrete paravertebral fluid collection is identified on this unenhanced study.  Vertebral body heights in the lumbar spine are preserved. Multilevel disc desiccation is present, greatest from L3-4 to L5-S1. There is moderate to severe disc space narrowing at L5-S1, similar to the prior MRI. Mild degenerative marrow changes are present L5-S1 and. Conus medullaris is normal in signal and terminates at L1.  T11-12: Small right foraminal disc protrusion without significant stenosis.  T12-L1:  Negative.  L1-2:  Negative.  L2-3:  Negative.  L3-4: Mild disc bulging asymmetric to the left without significant stenosis, unchanged.  L4-5: Mild circumferential disc bulging, superimposed right central disc extrusion,  and facet and ligamentum flavum hypertrophy results in moderate to severe right and moderate left lateral recess stenosis, mild-to-moderate spinal stenosis, and moderate right neural foraminal stenosis, similar to the prior study.  L5-S1: Prior right hemilaminectomy. Left central disc  protrusion results in mild to moderate left lateral recess stenosis, not significantly changed. No spinal canal stenosis.  IMPRESSION: 1. Complete loss of disc space height at T10-11 with endplate erosion/depression and extensive surrounding marrow changes. There is circumferential displacement of material/phlegmon including into the ventral epidural space with resultant moderate spinal stenosis. These findings are new from 07/14/2013 chest radiographs and are concerning for discitis/osteomyelitis. Other potential considerations might include rapidly progressive degenerative disc disease and associated mild compression fractures or dialysis related amyloid spondyloarthropathy. 2. Unchanged moderate disc degeneration in the lower lumbar spine as above. These results were called by telephone at the time of interpretation on 04/21/2014 at 8:09 pm to [facility] , who verbally acknowledged these results.   Electronically Signed   By: Sebastian AcheAllen  Grady   On: 04/21/2014 20:17   Dg Foot Complete Left  04/21/2014   CLINICAL DATA:  54 year old female with history of trauma from a fall 1 week ago with wound and pain in the left little toe.  EXAM: LEFT FOOT - COMPLETE 3+ VIEW  COMPARISON:  No priors.  FINDINGS: Three views of the left foot are slightly limited by extensive motion on the frontal projection. With these limitations in mind, there is no destructive lytic lesion in the left fifth toe to suggest osteomyelitis. No acute displaced fracture, subluxation or dislocation. Numerous vascular calcifications are noted. Small plantar calcaneal spur.  IMPRESSION: 1. No acute radiographic abnormality of the left foot. Specifically, no signs of osteomyelitis in the left fifth toe. 2. Atherosclerosis.   Electronically Signed   By: Trudie Reedaniel  Entrikin M.D.   On: 04/21/2014 17:29    Assessment/Plan: Presumed T10-11 discitis: The patient is being treated with antibiotics. We will continue to observe her.  LOS: 1 day      Chrisie Jankovich D 04/22/2014, 11:59 AM

## 2014-04-22 NOTE — Progress Notes (Signed)
Patient expresses concern about her regularly scheduled nighttime medication Tradgenta 5 mg not being administered tonight. RN informed patient that she called the doctor ([facility]) and that the on-call doctor would like to wait until the patients attending comes in on tomorrow morning before any additional medications are added or deleted from the present medication orders. Patient stated that while she understands the situation she is worried that her blood pressure and sugar will skyrocket and because she is not taking what she normally takes at home. RN consoled the patient she informed her of the medications that she would get at the moment until further direction from the doctor. RN administered Tenormin, Klonopin, Levemir 41 units, Rena-vit,  Novolog 8 units (a one time order under the direction of the on-call MD for [facility]), and oxycodone IR for pain management. Patients blood glucose at that time was 427. Novolog sliding scale will be administered on 1/10 AM until doctor makes rounds.  Nursing will continue to monitor.

## 2014-04-22 NOTE — Consult Note (Signed)
[facility]    Date of Admission:  04/21/2014          Reason for Consult: T10-11 discitis and left fifth toe cellulitis    Referring Physician: Dr. Pamella Pertostin Gherghe  Principal Problem:   Diskitis Active Problems:   Cellulitis and abscess of toe   Hyponatremia   Diabetes mellitus   Malignant hypertension   ESRD on dialysis   Anemia of renal disease   Leukocytosis   . clonazePAM  0.5 mg Oral QHS  . insulin aspart  0-5 Units Subcutaneous QHS  . insulin aspart  0-9 Units Subcutaneous TID WC  . insulin detemir  41 Units Subcutaneous QHS  . lanthanum  1,000 mg Oral TID WC  . linagliptin  5 mg Oral Daily  . multivitamin  1 tablet Oral QHS  . pantoprazole  40 mg Oral Daily  . sodium chloride  3 mL Intravenous Q12H    Recommendations: 1. Hold antibiotics for now 2. Ask interventional radiology to aspirate T10-11 disc space for Gram stain and cultures  3. Await results of yesterday's blood cultures and obtain blood culture results from her hemodialysis center last September  Assessment: She has developed thoracic discitis, perhaps related to her bacteremia last September. She also now has left foot cellulitis. I asked that her antibiotics be held temporarily while we ask interventional radiology to do a needle aspirate of the T10-11 disc space. Antibiotics can be restarted after the specimens are obtained. She will need further evaluation of her left foot as well.    HPI: [name] is a 54 y.o. female insulin-dependent diabetic with end-stage renal disease on hemodialysis. She had spine surgery 23 years ago. She has had some chronic low back pain but has noted gradually worsening pain in the last few months, especially in the past 2 weeks. She describes this as severe pressure in her mid to lower back. She has felt weak in her legs and yesterday fell and could not get up. Records indicate that she had an infected right chest hemodialysis  catheter last September associated with a "blood infection". Her catheter was replaced and she recalls receiving 6 weeks of some antibiotic after hemodialysis. She was recently started on vancomycin (about 4 days ago) by her nephrologist for left little toe infection. She notes that the redness and swelling is markedly improved. She's not had any recent fever, chills or sweats.   Review of Systems: Pertinent items are noted in HPI.  Past Medical History  Diagnosis Date  . Hypertension   . Anemia   . Diabetes mellitus     Type 2  . Chronic kidney disease     T,Th,Sa dialysis  . GERD (gastroesophageal reflux disease)     protonix for "burping"  . Complication of anesthesia     had trouble being put to sleep and then difficulty waking up. States she doesn't have any issues with Propofol    History  Substance Use Topics  . Smoking status: Never Smoker   . Smokeless tobacco: Never Used  . Alcohol Use: No    Family History  Problem Relation Age of Onset  . Fibromyalgia Mother   . Ovarian cancer Mother   . Rectal cancer Mother   . Parkinson's disease Father    Allergies  Allergen Reactions  . Codeine Other (See Comments)    hallucinations    OBJECTIVE: Blood pressure 128/68, pulse 63, temperature 97.6 F (36.4  C), temperature source Oral, resp. rate 16, height  (1.549 m), weight 190 lb (86.183 kg), SpO2 97 %. General: She is pleasant and in no distress. She is anxious and tearful during portions of the exam Skin: No rash Lungs: Clear Cor: Regular S1 and S2 with a 2/6 systolic murmur (question related to her right upper arm AV fistula) Chest: Left anterior chest hemodialysis catheter in place Abdomen: Obese, soft and nontender Extremities: Good thrill in right upper arm AV fistula without evidence of infection. Some superficial necrosis of her left fifth toe and web space with surrounding cellulitis. No drainage or odor. Her foot is cool to the touch. I cannot palpate  posterior tibial or dorsalis pedis pulses. Neuro: Pain with examination but good strength in lower extremities  Lab Results Lab Results  Component Value Date   WBC 10.1 04/22/2014   HGB 10.3* 04/22/2014   HCT 33.2* 04/22/2014   MCV 86.5 04/22/2014   PLT 342 04/22/2014    Lab Results  Component Value Date   CREATININE 7.23* 04/22/2014   BUN 40* 04/22/2014   NA 130* 04/22/2014   K 5.0 04/22/2014   CL 90* 04/22/2014   CO2 21 04/22/2014    Lab Results  Component Value Date   ALT 6 04/21/2014   AST 28 04/21/2014   ALKPHOS 106 04/21/2014   BILITOT 1.4* 04/21/2014   LEFT FOOT - COMPLETE 3+ VIEW 04/21/2014  IMPRESSION: 1. No acute radiographic abnormality of the left foot. Specifically, no signs of osteomyelitis in the left fifth toe. 2. Atherosclerosis.  Electronically Signed  By: Trudie Reed M.D.  On: 04/21/2014 17:29   MRI LUMBAR SPINE WITHOUT CONTRAST 04/21/2014  IMPRESSION: 1. Complete loss of disc space height at T10-11 with endplate erosion/depression and extensive surrounding marrow changes. There is circumferential displacement of material/phlegmon including into the ventral epidural space with resultant moderate spinal stenosis. These findings are new from 07/14/2013 chest radiographs and are concerning for discitis/osteomyelitis. Other potential considerations might include rapidly progressive degenerative disc disease and associated mild compression fractures or dialysis related amyloid spondyloarthropathy. 2. Unchanged moderate disc degeneration in the lower lumbar spine as above. These results were called by telephone at the time of interpretation on 04/21/2014 at 8:09 pm to [facility] , who verbally acknowledged these results.  Electronically Signed  By: Sebastian Ache  On: 04/21/2014 20:17  Microbiology: No results found for this or any previous visit (from the past 240 hour(s)).  Cliffton Asters, MD [facility] for  Infectious Disease [facility] [phone] pager   [phone] cell 04/22/2014, 12:58 PM

## 2014-04-22 NOTE — Progress Notes (Signed)
Patient will go to dialysis on today after the attending doctor and nephrologist assess the patient.

## 2014-04-22 NOTE — Progress Notes (Signed)
RN made a round into the patients room in order to administer pain medication. Patient was in tears stating "I am so uncomfortable." RN offered to assist patient with being repositioned. RN and patient successfully repositioned patient on left side . Patient thanked the Charity fundraiserN. Nursing will continue to monitor patients comfort level.

## 2014-04-22 NOTE — Consult Note (Signed)
Indication for Consultation:  Management of ESRD/hemodialysis; anemia, hypertension/volume and secondary hyperparathyroidism  HPI: [name] is a 54 y.o. female who presented to the ED yesterday after sustaining a fall at home while getting ready go to to HD. She receives HD TTS @ [place], Hx HTN and DM. She reports progressive LE weakness over the past month and has history of falls. Yesterday she said her knees buckled and gave out of her, she was unable to get up without assistance. She denies hitting her head, denies back pain or any lightheaded or dizziness. Lumbar MRI showed Complete loss of disc space height at T10-11, findings concerning for discitis/osteomyelitis. She was started on antibiotics in the ED. She missed HD yesterday d/t being in the ED. Her last HD was Thursday without difficulty. She had a fall about a month ago and hit her left foot on a ramp, there were initially no signs of infection but on Tuesday it was noted at HD there was redness, swelling and tenderness of her left pinky toe and she was started on cefazolin 2g. She has received 2 doses. Currently she is feeling well with no complaints. Will arrange HD tomorrow and Tuesday to get back on schedule.   Past Medical History  Diagnosis Date  . Hypertension   . Anemia   . Diabetes mellitus     Type 2  . Chronic kidney disease     T,Th,Sa dialysis  . GERD (gastroesophageal reflux disease)     protonix for "burping"  . Complication of anesthesia     had trouble being put to sleep and then difficulty waking up. States she doesn't have any issues with Propofol   Past Surgical History  Procedure Laterality Date  . Insertion of dialysis catheter N/A 07/16/2013    Procedure: INSERTION OF DIALYSIS CATHETER;  Surgeon: Larina Earthly, MD;  Location: [facility] OR;  Service: Vascular;  Laterality: N/A;  . Av fistula placement Right 07/18/2013    Procedure: RIGHT ARTERIOVENOUS (AV) FISTULA CREATION;  Surgeon: Sherren Kerns, MD;   Location: [facility] OR;  Service: Vascular;  Laterality: Right;  . Insertion of dialysis catheter Left 12/22/2013    Procedure: INSERTION OF DIALYSIS CATHETER;  Surgeon: Larina Earthly, MD;  Location: [facility] OR;  Service: Vascular;  Laterality: Left;  . Back surgery  17 years    lumbar surgery  . Eye surgery Bilateral     cataract surgery with lens implants  . Hysteroscopy w/ endometrial ablation    . Fistula superficialization Right 02/16/2014    Procedure:  RIGHT Fistula BRACHIOCEPHALIC Superficialization;  Surgeon: Chuck Hint, MD;  Location: [facility] OR;  Service: Vascular;  Laterality: Right;  . Fistulogram N/A 01/15/2014    Procedure: FISTULOGRAM;  Surgeon: Chuck Hint, MD;  Location: [facility] CATH LAB;  Service: Cardiovascular;  Laterality: N/A;   Family History  Problem Relation Age of Onset  . Fibromyalgia Mother   . Ovarian cancer Mother   . Rectal cancer Mother   . Parkinson's disease Father    Social History:  reports that she has never smoked. She has never used smokeless tobacco. She reports that she does not drink alcohol or use illicit drugs. Allergies  Allergen Reactions  . Codeine Other (See Comments)    hallucinations   Prior to Admission medications   Medication Sig Start Date End Date Taking? Authorizing Provider  atenolol (TENORMIN) 25 MG tablet Take 1 tablet (25 mg total) by mouth at bedtime. 07/26/13  Yes Marden Noble, MD  b complex-vitamin c-folic acid (NEPHRO-VITE) 0.8 MG TABS tablet Take 1 tablet by mouth at bedtime.   Yes Historical Provider, MD  clonazePAM (KLONOPIN) 0.5 MG tablet Take 0.5 mg by mouth at bedtime.   Yes Historical Provider, MD  famotidine (PEPCID) 20 MG tablet Take 20 mg by mouth at bedtime.    Yes Historical Provider, MD  HYDROcodone-acetaminophen (NORCO/VICODIN) 5-325 MG per tablet Take 1 tablet by mouth at bedtime as needed for moderate pain.    Yes Historical Provider, MD  ibuprofen (ADVIL,MOTRIN) 200 MG tablet Take 400-600 mg by mouth every 8  (eight) hours as needed for moderate pain.    Yes Historical Provider, MD  insulin aspart (NOVOLOG) 100 UNIT/ML injection Inject 0-12 Units into the skin 4 (four) times daily - after meals and at bedtime. Per sliding scale. Usually takes last dose around 10pm   Yes Historical Provider, MD  insulin detemir (LEVEMIR) 100 UNIT/ML injection Inject 40-42 Units into the skin at bedtime. Usually takes around midnight   Yes Historical Provider, MD  lanthanum (FOSRENOL) 1000 MG chewable tablet Chew 1,000 mg by mouth See admin instructions. Takes with meals and snacks   Yes Historical Provider, MD  linagliptin (TRADJENTA) 5 MG TABS tablet Take 1 tablet (5 mg total) by mouth daily. 07/26/13  Yes Marden Noble, MD  oxyCODONE-acetaminophen (ROXICET) 5-325 MG per tablet Take 1-2 tablets by mouth every 4 (four) hours as needed for severe pain. 02/16/14  Yes Chuck Hint, MD  pantoprazole (PROTONIX) 40 MG tablet Take 1 tablet (40 mg total) by mouth daily. 07/26/13  Yes Marden Noble, MD  promethazine (PHENERGAN) 25 MG tablet Take 1 tablet (25 mg total) by mouth every 6 (six) hours as needed for nausea or vomiting. 07/26/13  Yes Marden Noble, MD  Turmeric Curcumin 500 MG CAPS Take 500 mg by mouth daily.   Yes Historical Provider, MD  zaleplon (SONATA) 10 MG capsule Take 10 mg by mouth at bedtime as needed for sleep.   Yes Historical Provider, MD  doxercalciferol (HECTOROL) 4 MCG/2ML injection Inject 2 mLs (4 mcg total) into the vein Every Tuesday,Thursday,and Saturday with dialysis. 07/26/13   Marden Noble, MD  ferric gluconate 125 mg in sodium chloride 0.9 % 100 mL Inject 125 mg into the vein Every Tuesday,Thursday,and Saturday with dialysis. 07/26/13   Marden Noble, MD  iron sucrose in sodium chloride 0.9 % 100 mL Inject 100 mg into the vein once a week. Receives at dialysis    Historical Provider, MD   Current Facility-Administered Medications  Medication Dose Route Frequency Provider Last Rate Last Dose  . 0.9  %  sodium chloride infusion  250 mL Intravenous PRN Ron Parker, MD   Stopped at 04/22/14 [phone]  . acetaminophen (TYLENOL) tablet 650 mg  650 mg Oral Q6H PRN Ron Parker, MD       Or  . acetaminophen (TYLENOL) suppository 650 mg  650 mg Rectal Q6H PRN Ron Parker, MD      . alum & mag hydroxide-simeth (MAALOX/MYLANTA) 200-200-20 MG/5ML suspension 30 mL  30 mL Oral Q6H PRN Ron Parker, MD      . clonazePAM Scarlette Calico) tablet 0.5 mg  0.5 mg Oral QHS Ron Parker, MD   0.5 mg at 04/22/14 0012  . HYDROmorphone (DILAUDID) injection 0.5-1 mg  0.5-1 mg Intravenous Q3H PRN Ron Parker, MD   1 mg at 04/22/14 0540  . insulin detemir (LEVEMIR) injection 41 Units  41 Units Subcutaneous QHS Harvette  Velora Heckler Jenkins, MD   41 Units at 04/22/14 0013  . lanthanum (FOSRENOL) chewable tablet 1,000 mg  1,000 mg Oral TID WC Ron ParkerHarvette C Jenkins, MD   1,000 mg at 04/22/14 0800  . lanthanum (FOSRENOL) chewable tablet 1,000 mg  1,000 mg Oral BID BM PRN Ron ParkerHarvette C Jenkins, MD      . linagliptin (TRADJENTA) tablet 5 mg  5 mg Oral Daily Costin Otelia SergeantM Gherghe, MD   5 mg at 04/22/14 [id number]  . multivitamin (RENA-VIT) tablet 1 tablet  1 tablet Oral QHS Ron ParkerHarvette C Jenkins, MD   1 tablet at 04/22/14 0012  . ondansetron (ZOFRAN) tablet 4 mg  4 mg Oral Q6H PRN Ron ParkerHarvette C Jenkins, MD       Or  . ondansetron (ZOFRAN) injection 4 mg  4 mg Intravenous Q6H PRN Ron ParkerHarvette C Jenkins, MD   4 mg at 04/22/14 [id number]  . oxyCODONE (Oxy IR/ROXICODONE) immediate release tablet 5 mg  5 mg Oral Q4H PRN Ron ParkerHarvette C Jenkins, MD   5 mg at 04/22/14 0012  . pantoprazole (PROTONIX) EC tablet 40 mg  40 mg Oral Daily Ron ParkerHarvette C Jenkins, MD   40 mg at 04/22/14 [id number]  . promethazine (PHENERGAN) tablet 25 mg  25 mg Oral Q6H PRN Ron ParkerHarvette C Jenkins, MD   25 mg at 04/22/14 [id number]  . sodium chloride 0.9 % injection 3 mL  3 mL Intravenous Q12H Ron ParkerHarvette C Jenkins, MD   3 mL at 04/22/14 0952  . sodium chloride 0.9 % injection 3 mL  3 mL Intravenous  PRN Ron ParkerHarvette C Jenkins, MD       Labs: Basic Metabolic Panel:  Recent Labs Lab 04/21/14 1455 04/22/14 0505  NA 128* 130*  K 4.6 5.0  CL 89* 90*  CO2 21 21  GLUCOSE 460* 361*  BUN 33* 40*  CREATININE 6.44* 7.23*  CALCIUM 10.0 9.8   Liver Function Tests:  Recent Labs Lab 04/21/14 1455  AST 28  ALT 6  ALKPHOS 106  BILITOT 1.4*  PROT 6.9  ALBUMIN 3.1*   No results for input(s): LIPASE, AMYLASE in the last 168 hours. No results for input(s): AMMONIA in the last 168 hours. CBC:  Recent Labs Lab 04/21/14 1455 04/22/14 0505  WBC 10.7* 10.1  NEUTROABS 9.5*  --   HGB 10.6* 10.3*  HCT 33.1* 33.2*  MCV 86.0 86.5  PLT 315 342   Cardiac Enzymes: No results for input(s): CKTOTAL, CKMB, CKMBINDEX, TROPONINI in the last 168 hours. CBG:  Recent Labs Lab 04/21/14 2330  GLUCAP 427*   Iron Studies: No results for input(s): IRON, TIBC, TRANSFERRIN, FERRITIN in the last 72 hours. Studies/Results: Mr Lumbar Spine Wo Contrast  04/21/2014   CLINICAL DATA:  Worsening back pain and 1 month of bilateral leg weakness, worse today. Fall.  EXAM: MRI LUMBAR SPINE WITHOUT CONTRAST  TECHNIQUE: Multiplanar, multisequence MR imaging of the lumbar spine was performed. No intravenous contrast was administered.  COMPARISON:  Chest radiographs 07/14/2013. Lumbar spine MRI 05/13/2012.  FINDINGS: There is minimal right convex curvature at the thoracolumbar junction. There is no significant listhesis. The visualized portion of the lower thoracic spine demonstrates complete loss of disc space height at T10-11 which is new from prior chest radiographs and was not included on the prior MRI. There are extensive adjacent marrow changes in the T10 and T11 vertebral bodies, predominantly intermediate to low T1 signal intensity and mixed T2 hypo- and hyperintensity. STIR images demonstrate mild edema along the endplates. There does not appear to  be a large amount of fluid signal in the disc space, although  evaluation is somewhat limited by the essentially complete disc space height loss. There is mild anterior wedging of the T10 vertebral body due to superior endplate compression/ resorption. There is also mild depression versus erosion of the T11 superior endplate. There is circumferential displacement of disc material/bone/other soft tissue at the T10-11 disc space level including into the ventral spinal canal. Material in the ventral epidural space measures 6 mm in AP thickness and, together with facet arthrosis, results in moderate spinal stenosis with mild-to-moderate impression on the spinal cord. There is also mild right and moderate left neural foraminal stenosis. No discrete paravertebral fluid collection is identified on this unenhanced study.  Vertebral body heights in the lumbar spine are preserved. Multilevel disc desiccation is present, greatest from L3-4 to L5-S1. There is moderate to severe disc space narrowing at L5-S1, similar to the prior MRI. Mild degenerative marrow changes are present L5-S1 and. Conus medullaris is normal in signal and terminates at L1.  T11-12: Small right foraminal disc protrusion without significant stenosis.  T12-L1:  Negative.  L1-2:  Negative.  L2-3:  Negative.  L3-4: Mild disc bulging asymmetric to the left without significant stenosis, unchanged.  L4-5: Mild circumferential disc bulging, superimposed right central disc extrusion, and facet and ligamentum flavum hypertrophy results in moderate to severe right and moderate left lateral recess stenosis, mild-to-moderate spinal stenosis, and moderate right neural foraminal stenosis, similar to the prior study.  L5-S1: Prior right hemilaminectomy. Left central disc protrusion results in mild to moderate left lateral recess stenosis, not significantly changed. No spinal canal stenosis.  IMPRESSION: 1. Complete loss of disc space height at T10-11 with endplate erosion/depression and extensive surrounding marrow changes. There is  circumferential displacement of material/phlegmon including into the ventral epidural space with resultant moderate spinal stenosis. These findings are new from 07/14/2013 chest radiographs and are concerning for discitis/osteomyelitis. Other potential considerations might include rapidly progressive degenerative disc disease and associated mild compression fractures or dialysis related amyloid spondyloarthropathy. 2. Unchanged moderate disc degeneration in the lower lumbar spine as above. These results were called by telephone at the time of interpretation on 04/21/2014 at 8:09 pm to [facility] , who verbally acknowledged these results.   Electronically Signed   By: Sebastian Ache   On: 04/21/2014 20:17   Dg Foot Complete Left  04/21/2014   CLINICAL DATA:  54 year old female with history of trauma from a fall 1 week ago with wound and pain in the left little toe.  EXAM: LEFT FOOT - COMPLETE 3+ VIEW  COMPARISON:  No priors.  FINDINGS: Three views of the left foot are slightly limited by extensive motion on the frontal projection. With these limitations in mind, there is no destructive lytic lesion in the left fifth toe to suggest osteomyelitis. No acute displaced fracture, subluxation or dislocation. Numerous vascular calcifications are noted. Small plantar calcaneal spur.  IMPRESSION: 1. No acute radiographic abnormality of the left foot. Specifically, no signs of osteomyelitis in the left fifth toe. 2. Atherosclerosis.   Electronically Signed   By: Trudie Reed M.D.   On: 04/21/2014 17:29    Review of Systems: Gen: Denies any fever, chills, sweats, anorexia, fatigue, weakness, malaise, weight loss, and sleep disorder. Fair appetite, not great HEENT: No visual complaints, No history of Retinopathy. Normal external appearance No Epistaxis or Sore throat. No sinusitis.   CV: Denies chest pain, angina, palpitations, syncope, orthopnea, PND, peripheral edema, and claudication.  Resp: Denies dyspnea at  rest, dyspnea with exercise, cough, sputum, wheezing, coughing up blood, and pleurisy. GI: Reports nausea this AM. Denies vomiting blood, jaundice, and fecal incontinence.   Denies dysphagia or odynophagia. GU : anuric MS: Reports LE weakness, Denies back pain Derm: Reports improvement in L toe wound Psych: Denies depression, anxiety, memory loss, suicidal ideation, hallucinations, paranoia, and confusion. Heme: Denies bruising, bleeding, and enlarged lymph nodes. Neuro: No headache.  No diplopia. No dysarthria.  No dysphasia.  No history of CVA.  No Seizures. No paresthesias.  No weakness.   Physical Exam: Filed Vitals:   04/21/14 2315 04/22/14 0644 04/22/14 0948 04/22/14 0951  BP: 152/57 128/68    Pulse: 64 63    Temp: 98.2 F (36.8 C) 97.6 F (36.4 C)    TempSrc: Oral Oral    Resp: 18 16 16 16   Height:      Weight:      SpO2: 100% 97% 84% 97%     General: Well developed, well nourished, in no acute distress. Head: Normocephalic, atraumatic, sclera non-icteric, mucus membranes are moist Neck: Supple. JVD not elevated. Lungs: Clear bilaterally to auscultation without wheezes, rales, or rhonchi. Breathing is unlabored. Sats 100% on RA Heart: RRR with S1 S2. No murmurs, rubs, or gallops appreciated. Abdomen: Soft, obese, non-tender, non-distended with normoactive bowel sounds. No rebound/guarding. No obvious abdominal masses. M-S:  bilat LE weakness, sensation intact Lower extremities: L pinky toe erythema and ulceration. Mild L pedal edema. Neuro: Alert and oriented X 3. Moves all extremities spontaneously. Psych:  Responds to questions appropriately with a normal affect. Dialysis Access: L IJ cath and R AVF- used 2 needles in AVF for first time on Thursday- continue using 2 needles  Dialysis Orders:  TTS east 85 kgs   2K/2ca    4 hr    400/800   6200 Heparin.    L IJ cath and R AVF Calcitriol 0.75   Aranesp 25 q week.  Assessment/Plan: 1.  Fall/LE weakness- discitis on MRI,  antibiotics currently on hold per primary. blood cultures pending, Neurosurgery and ID consulted- has history of lumbar laminectomy about 20 years ago. Had lumbar degenerative changes on MRI Jan 2014. 2. Bradycardia/Heart block- atenolol Dc'd. Echo pending. asymptomatic 3. Left toe ulcer/cellulitis- no acute abnormality and No osteomyelitis on xray- received 2 doses of cefazolin at outpt center 4.  ESRD -  TTS [facility]. HD pending tomorrow. K+ 5 5.  Hypertension/volume  - 128/68 atenolol dc'd. 1 kg over edw. No volume excess. Left slightly under edw last week- may need decrease 6.  Anemia  - hgb 10.3. Cont Aranesp weekly.  7.  Metabolic bone disease -  Ca+ 9.8/10.5 corrected. Cont fosrenol.- last phos 8.6. Last PTH 158- hold calcitriol for now 8.  Nutrition - Alb 3.1 renal diet. Vit 9. DM- per primary  Jetty Duhamel, NP [facility] [phone] 04/22/2014, 11:32 AM

## 2014-04-22 NOTE — Progress Notes (Signed)
PROGRESS NOTE  [name]:[id number] DOB: 11/06/1960 DOA: 04/21/2014 PCP: Pearla Dubonnet, MD  HPI: [name] is a 54 y.o. female with a history of ESRD on HD, HTN, IDDM, Lumbar DDD who presents to the ED with complaints of progressive weakness over the past month in both legs and her knee buckling and precipitating a fall today. She was not able to get up and was brought to the ED. She also had an injury to her Left 5th Toe 1 month ago and she saw her PCP, and over the month she developed increased redness so her dialysis doctor placed over on IV Vancomycin to treat a cellulitis with her dialysis Rx 4 days ago. In the ED tonight, An X-Ray of the Left Foot was performed and was negative for fracture and findings of Osteomyelitis, and an MRI of the Lumbar Spine which revealed +diskitis/Osteomyelitis of the T10-T11 disk. NeuroSurgery Dr. Tressie Stalker was consulted and patient was placed on IV Vancomycin and Rocephin and referred for medical admission  Subjective / 24 H Interval events Endorses nausea this morning, no chest pain/dyspnea  Assessment/Plan: Principal Problem:   Diskitis Active Problems:   Hyponatremia   Diabetes mellitus   Malignant hypertension   ESRD on dialysis   Cellulitis and abscess of toe   Anemia of renal disease   Leukocytosis  Diskitis- on MRI - neurosurgery consulted - consulted ID today, will hold antibiotics for now, patient not septic, will likely need IR in 1-2 days for aspiration, off antibiotic until then to improve yield   Cellulitis and abscess of toe - stable, hold antibiotics for now per #1, closely monitor, start if febrile/site gets worse   ESRD on dialysis - She missed her dialysis yesterday, consulted nephrology this morning  Mobitz type I heart block - On telemetry, reviewed strips with Dr. Rennis Golden from cardiology, this is most consistent with Mobitz type I, I have discontinued her atenolol, will obtain a 2-D  echo, continue to closely monitor. She doesn't have any symptoms.   Diabetes mellitus - Continue Levemir Rx - SSI coverage - Check HbA1C  Malignant hypertension - discontinue Atenolol  - monitor BPs, now controlled  Hyponatremia - Correct with Dialysis  Anemia of Renal Disease -Continue Reno-vite - Monitor H/Hs  Leukocytosis - due to #1 and #2,   Diet: Diet renal/carb modified with 1200 ml fluid restriction Fluids: none DVT Prophylaxis: SCD  Code Status: Full Code Family Communication: none  Disposition Plan: remain inpatient  Consultants:  ID  Nephrology  Cardiology (phone)  Procedures:  None    Antibiotics Vancomycin 1/5 >> 1/10 (at prior 2 HD sessions earlier this week  Ceftriaxone 1/9>>1/10   Studies  Mr Lumbar Spine Wo Contrast  04/21/2014   CLINICAL DATA:  Worsening back pain and 1 month of bilateral leg weakness, worse today. Fall.  EXAM: MRI LUMBAR SPINE WITHOUT CONTRAST  TECHNIQUE: Multiplanar, multisequence MR imaging of the lumbar spine was performed. No intravenous contrast was administered.  COMPARISON:  Chest radiographs 07/14/2013. Lumbar spine MRI 05/13/2012.  FINDINGS: There is minimal right convex curvature at the thoracolumbar junction. There is no significant listhesis. The visualized portion of the lower thoracic spine demonstrates complete loss of disc space height at T10-11 which is new from prior chest radiographs and was not included on the prior MRI. There are extensive adjacent marrow changes in the T10 and T11 vertebral bodies, predominantly intermediate to low T1 signal intensity and mixed T2 hypo- and hyperintensity. STIR images demonstrate  mild edema along the endplates. There does not appear to be a large amount of fluid signal in the disc space, although evaluation is somewhat limited by the essentially complete disc space height loss. There is mild anterior wedging of the T10 vertebral body due to superior  endplate compression/ resorption. There is also mild depression versus erosion of the T11 superior endplate. There is circumferential displacement of disc material/bone/other soft tissue at the T10-11 disc space level including into the ventral spinal canal. Material in the ventral epidural space measures 6 mm in AP thickness and, together with facet arthrosis, results in moderate spinal stenosis with mild-to-moderate impression on the spinal cord. There is also mild right and moderate left neural foraminal stenosis. No discrete paravertebral fluid collection is identified on this unenhanced study.  Vertebral body heights in the lumbar spine are preserved. Multilevel disc desiccation is present, greatest from L3-4 to L5-S1. There is moderate to severe disc space narrowing at L5-S1, similar to the prior MRI. Mild degenerative marrow changes are present L5-S1 and. Conus medullaris is normal in signal and terminates at L1.  T11-12: Small right foraminal disc protrusion without significant stenosis.  T12-L1:  Negative.  L1-2:  Negative.  L2-3:  Negative.  L3-4: Mild disc bulging asymmetric to the left without significant stenosis, unchanged.  L4-5: Mild circumferential disc bulging, superimposed right central disc extrusion, and facet and ligamentum flavum hypertrophy results in moderate to severe right and moderate left lateral recess stenosis, mild-to-moderate spinal stenosis, and moderate right neural foraminal stenosis, similar to the prior study.  L5-S1: Prior right hemilaminectomy. Left central disc protrusion results in mild to moderate left lateral recess stenosis, not significantly changed. No spinal canal stenosis.  IMPRESSION: 1. Complete loss of disc space height at T10-11 with endplate erosion/depression and extensive surrounding marrow changes. There is circumferential displacement of material/phlegmon including into the ventral epidural space with resultant moderate spinal stenosis. These findings are new  from 07/14/2013 chest radiographs and are concerning for discitis/osteomyelitis. Other potential considerations might include rapidly progressive degenerative disc disease and associated mild compression fractures or dialysis related amyloid spondyloarthropathy. 2. Unchanged moderate disc degeneration in the lower lumbar spine as above. These results were called by telephone at the time of interpretation on 04/21/2014 at 8:09 pm to [facility] , who verbally acknowledged these results.   Electronically Signed   By: Sebastian AcheAllen  Grady   On: 04/21/2014 20:17   Dg Foot Complete Left  04/21/2014   CLINICAL DATA:  54 year old female with history of trauma from a fall 1 week ago with wound and pain in the left little toe.  EXAM: LEFT FOOT - COMPLETE 3+ VIEW  COMPARISON:  No priors.  FINDINGS: Three views of the left foot are slightly limited by extensive motion on the frontal projection. With these limitations in mind, there is no destructive lytic lesion in the left fifth toe to suggest osteomyelitis. No acute displaced fracture, subluxation or dislocation. Numerous vascular calcifications are noted. Small plantar calcaneal spur.  IMPRESSION: 1. No acute radiographic abnormality of the left foot. Specifically, no signs of osteomyelitis in the left fifth toe. 2. Atherosclerosis.   Electronically Signed   By: Trudie Reedaniel  Entrikin M.D.   On: 04/21/2014 17:29    Objective  Filed Vitals:   04/21/14 2201 04/21/14 2210 04/21/14 2315 04/22/14 0644  BP:  146/76 152/57 128/68  Pulse: 66 65 64 63  Temp:   98.2 F (36.8 C) 97.6 F (36.4 C)  TempSrc:   Oral Oral  Resp:  Height:      Weight:      SpO2: 100% 99% 100% 97%    Intake/Output Summary (Last 24 hours) at 04/22/14 0710 Last data filed at 04/22/14 0653  Gross per 24 hour  Intake    240 ml  Output      0 ml  Net    240 ml   Filed Weights   04/21/14 1344  Weight: 86.183 kg (190 lb)    Exam:  General:  NAD  HEENT: no scleral  icterus  Cardiovascular: RRR, 3/6 SEM  Respiratory: CTA biL  Abdomen: soft, non tender  Skin: no rashes, left 5th toe with cellulitic changes  Neuro: non focal   Data Reviewed: Basic Metabolic Panel:  Recent Labs Lab 04/21/14 1455 04/22/14 0505  NA 128* 130*  K 4.6 5.0  CL 89* 90*  CO2 21 21  GLUCOSE 460* 361*  BUN 33* 40*  CREATININE 6.44* 7.23*  CALCIUM 10.0 9.8   Liver Function Tests:  Recent Labs Lab 04/21/14 1455  AST 28  ALT 6  ALKPHOS 106  BILITOT 1.4*  PROT 6.9  ALBUMIN 3.1*   CBC:  Recent Labs Lab 04/21/14 1455 04/22/14 0505  WBC 10.7* 10.1  NEUTROABS 9.5*  --   HGB 10.6* 10.3*  HCT 33.1* 33.2*  MCV 86.0 86.5  PLT 315 342   BNP (last 3 results)  Recent Labs  07/10/13 1330  PROBNP 15005.0*   CBG:  Recent Labs Lab 04/21/14 2330  GLUCAP 427*   Scheduled Meds: . atenolol  25 mg Oral QHS  . cefTRIAXone (ROCEPHIN)  IV  1 g Intravenous Q24H  . cefTRIAXone (ROCEPHIN)  IV  2 g Intravenous Q24H  . clonazePAM  0.5 mg Oral QHS  . insulin detemir  41 Units Subcutaneous QHS  . lanthanum  1,000 mg Oral TID WC  . multivitamin  1 tablet Oral QHS  . pantoprazole  40 mg Oral Daily  . sodium chloride  3 mL Intravenous Q12H  . vancomycin  1,750 mg Intravenous Once  . [START ON 04/24/2014] vancomycin  1,000 mg Intravenous Q T,Th,Sa-HD   Continuous Infusions:   Pamella Pert, MD [facility] Pager [phone]. If 7 PM - 7 AM, please contact night-coverage at [email or web address], password [facility] 04/22/2014, 7:10 AM  LOS: 1 day

## 2014-04-23 ENCOUNTER — Inpatient Hospital Stay (HOSPITAL_COMMUNITY): Payer: Medicaid Other

## 2014-04-23 DIAGNOSIS — L97509 Non-pressure chronic ulcer of other part of unspecified foot with unspecified severity: Secondary | ICD-10-CM

## 2014-04-23 DIAGNOSIS — Z113 Encounter for screening for infections with a predominantly sexual mode of transmission: Secondary | ICD-10-CM | POA: Insufficient documentation

## 2014-04-23 DIAGNOSIS — R001 Bradycardia, unspecified: Secondary | ICD-10-CM | POA: Diagnosis present

## 2014-04-23 DIAGNOSIS — R778 Other specified abnormalities of plasma proteins: Secondary | ICD-10-CM | POA: Diagnosis present

## 2014-04-23 DIAGNOSIS — R7989 Other specified abnormal findings of blood chemistry: Secondary | ICD-10-CM

## 2014-04-23 DIAGNOSIS — E11621 Type 2 diabetes mellitus with foot ulcer: Secondary | ICD-10-CM | POA: Diagnosis present

## 2014-04-23 DIAGNOSIS — I359 Nonrheumatic aortic valve disorder, unspecified: Secondary | ICD-10-CM

## 2014-04-23 DIAGNOSIS — L089 Local infection of the skin and subcutaneous tissue, unspecified: Secondary | ICD-10-CM | POA: Insufficient documentation

## 2014-04-23 LAB — HEMOGLOBIN A1C
Hgb A1c MFr Bld: 9.5 % — ABNORMAL HIGH (ref <5.7)
MEAN PLASMA GLUCOSE: 226 mg/dL — AB (ref <117)

## 2014-04-23 LAB — GLUCOSE, CAPILLARY
GLUCOSE-CAPILLARY: 53 mg/dL — AB (ref 70–99)
GLUCOSE-CAPILLARY: 95 mg/dL (ref 70–99)
GLUCOSE-CAPILLARY: 85 mg/dL (ref 70–99)
GLUCOSE-CAPILLARY: 126 mg/dL — AB (ref 70–99)
Glucose-Capillary: 49 mg/dL — ABNORMAL LOW (ref 70–99)
Glucose-Capillary: 64 mg/dL — ABNORMAL LOW (ref 70–99)
Glucose-Capillary: 70 mg/dL (ref 70–99)
Glucose-Capillary: 97 mg/dL (ref 70–99)
Glucose-Capillary: 77 mg/dL (ref 70–99)

## 2014-04-23 LAB — RENAL FUNCTION PANEL
ALBUMIN: 2.9 g/dL — AB (ref 3.5–5.2)
Anion gap: 16 — ABNORMAL HIGH (ref 5–15)
BUN: 45 mg/dL — ABNORMAL HIGH (ref 6–23)
CHLORIDE: 92 meq/L — AB (ref 96–112)
CO2: 25 mmol/L (ref 19–32)
CREATININE: 8.29 mg/dL — AB (ref 0.50–1.10)
Calcium: 9.7 mg/dL (ref 8.4–10.5)
GFR calc Af Amer: 6 mL/min — ABNORMAL LOW (ref >90)
GFR, EST NON AFRICAN AMERICAN: 5 mL/min — AB (ref >90)
GLUCOSE: 48 mg/dL — AB (ref 70–99)
Phosphorus: 7.1 mg/dL — ABNORMAL HIGH (ref 2.3–4.6)
Potassium: 4.1 mmol/L (ref 3.5–5.1)
Sodium: 133 mmol/L — ABNORMAL LOW (ref 135–145)

## 2014-04-23 LAB — SEDIMENTATION RATE: Sed Rate: 53 mm/hr — ABNORMAL HIGH (ref 0–22)

## 2014-04-23 LAB — CBC
HCT: 33 % — ABNORMAL LOW (ref 36.0–46.0)
HEMOGLOBIN: 10.4 g/dL — AB (ref 12.0–15.0)
MCH: 27.4 pg (ref 26.0–34.0)
MCHC: 31.5 g/dL (ref 30.0–36.0)
MCV: 86.8 fL (ref 78.0–100.0)
PLATELETS: 294 10*3/uL (ref 150–400)
RBC: 3.8 MIL/uL — AB (ref 3.87–5.11)
RDW: 18.4 % — AB (ref 11.5–15.5)
WBC: 10.2 10*3/uL (ref 4.0–10.5)

## 2014-04-23 LAB — TROPONIN I: Troponin I: 0.27 ng/mL — ABNORMAL HIGH (ref <0.031)

## 2014-04-23 LAB — PROTIME-INR
INR: 1.44 (ref 0.00–1.49)
PROTHROMBIN TIME: 17.7 s — AB (ref 11.6–15.2)

## 2014-04-23 LAB — C-REACTIVE PROTEIN: CRP: 8.4 mg/dL — AB (ref <0.60)

## 2014-04-23 LAB — APTT: aPTT: 33 seconds (ref 24–37)

## 2014-04-23 MED ORDER — DARBEPOETIN ALFA 25 MCG/0.42ML IJ SOSY
25.0000 ug | PREFILLED_SYRINGE | INTRAMUSCULAR | Status: DC
Start: 2014-04-26 — End: 2014-04-23

## 2014-04-23 MED ORDER — SODIUM CHLORIDE 0.9 % IV SOLN: 100.0000 mL | INTRAVENOUS | Status: DC | PRN

## 2014-04-23 MED ORDER — ALTEPLASE 2 MG IJ SOLR: 2.0000 mg | Freq: Once | INTRAMUSCULAR | Status: DC | PRN

## 2014-04-23 MED ORDER — DEXTROSE 50 % IV SOLN
50.0000 mL | Freq: Once | INTRAVENOUS | Status: AC
  Administered 2014-04-23: 50 mL via INTRAVENOUS
  Filled 2014-04-23: qty 50

## 2014-04-23 MED ORDER — OXYCODONE HCL 5 MG PO TABS
ORAL_TABLET | ORAL | Status: AC
  Filled 2014-04-23: qty 1

## 2014-04-23 MED ORDER — LIDOCAINE HCL (PF) 1 % IJ SOLN: 5.0000 mL | INTRAMUSCULAR | Status: DC | PRN

## 2014-04-23 MED ORDER — MUPIROCIN CALCIUM 2 % EX CREA
TOPICAL_CREAM | Freq: Two times a day (BID) | CUTANEOUS | Status: DC
  Administered 2014-04-23 – 2014-04-26 (×6): via TOPICAL
  Administered 2014-04-26: 1 via TOPICAL
  Administered 2014-04-27 – 2014-05-07 (×11): via TOPICAL
  Filled 2014-04-23 (×3): qty 15

## 2014-04-23 MED ORDER — NEPRO/CARBSTEADY PO LIQD
237.0000 mL | Freq: Two times a day (BID) | ORAL | Status: DC
  Administered 2014-04-24 – 2014-05-02 (×5): 237 mL via ORAL
  Filled 2014-04-23 (×21): qty 237

## 2014-04-23 MED ORDER — NEPRO/CARBSTEADY PO LIQD: 237.0000 mL | ORAL | Status: DC | PRN

## 2014-04-23 MED ORDER — INSULIN DETEMIR 100 UNIT/ML ~~LOC~~ SOLN
35.0000 [IU] | Freq: Every day | SUBCUTANEOUS | Status: DC
  Administered 2014-04-24 – 2014-04-25 (×2): 35 [IU] via SUBCUTANEOUS
  Filled 2014-04-23 (×3): qty 0.35

## 2014-04-23 MED ORDER — HYDROMORPHONE HCL 1 MG/ML IJ SOLN
INTRAMUSCULAR | Status: AC
  Filled 2014-04-23: qty 1

## 2014-04-23 MED ORDER — DARBEPOETIN ALFA 25 MCG/0.42ML IJ SOSY: 25.0000 ug | PREFILLED_SYRINGE | INTRAMUSCULAR | Status: DC

## 2014-04-23 MED ORDER — PENTAFLUOROPROP-TETRAFLUOROETH EX AERO: 1.0000 "application " | INHALATION_SPRAY | CUTANEOUS | Status: DC | PRN

## 2014-04-23 MED ORDER — HEPARIN SODIUM (PORCINE) 1000 UNIT/ML DIALYSIS: 1000.0000 [IU] | INTRAMUSCULAR | Status: DC | PRN

## 2014-04-23 MED ORDER — HEPARIN SODIUM (PORCINE) 1000 UNIT/ML DIALYSIS
6000.0000 [IU] | Freq: Once | INTRAMUSCULAR | Status: DC
  Filled 2014-04-23: qty 6

## 2014-04-23 MED ORDER — LIDOCAINE-PRILOCAINE 2.5-2.5 % EX CREA: 1.0000 "application " | TOPICAL_CREAM | CUTANEOUS | Status: DC | PRN

## 2014-04-23 NOTE — Progress Notes (Signed)
[facility]    Subjective: No new complaints, echocardiogram being performed   Antibiotics:  Anti-infectives    Start     Dose/Rate Route Frequency Ordered Stop   04/24/14 1200  vancomycin (VANCOCIN) IVPB 1000 mg/200 mL premix  Status:  Discontinued     1,000 mg200 mL/hr over 60 Minutes Intravenous Every T-Th-Sa (Hemodialysis) 04/21/14 2200 04/22/14 0855   04/22/14 2200  cefTRIAXone (ROCEPHIN) 1 g in dextrose 5 % 50 mL IVPB  Status:  Discontinued     1 g100 mL/hr over 30 Minutes Intravenous Every 24 hours 04/21/14 2211 04/22/14 0855   04/22/14 0645  vancomycin (VANCOCIN) 1,750 mg in sodium chloride 0.9 % 500 mL IVPB  Status:  Discontinued     1,750 mg250 mL/hr over 120 Minutes Intravenous  Once 04/22/14 0634 04/22/14 0855   04/21/14 2200  cefTRIAXone (ROCEPHIN) 2 g in dextrose 5 % 50 mL IVPB  Status:  Discontinued     2 g100 mL/hr over 30 Minutes Intravenous Every 24 hours 04/21/14 2157 04/22/14 0855      Medications: Scheduled Meds: . clonazePAM  0.5 mg Oral QHS  . feeding supplement (NEPRO CARB STEADY)  237 mL Oral BID BM  . insulin aspart  0-5 Units Subcutaneous QHS  . insulin aspart  0-9 Units Subcutaneous TID WC  . insulin detemir  35 Units Subcutaneous QHS  . lanthanum  1,000 mg Oral TID WC  . linagliptin  5 mg Oral Daily  . multivitamin  1 tablet Oral QHS  . mupirocin cream   Topical BID  . pantoprazole  40 mg Oral Daily  . sodium chloride  3 mL Intravenous Q12H   Continuous Infusions:  PRN Meds:.sodium chloride, acetaminophen **OR** acetaminophen, alum & mag hydroxide-simeth, HYDROmorphone (DILAUDID) injection, lanthanum, ondansetron **OR** ondansetron (ZOFRAN) IV, oxyCODONE, promethazine, sodium chloride    Objective: Weight change:   Intake/Output Summary (Last 24 hours) at 04/23/14 1739 Last data filed at 04/23/14 1618  Gross per 24 hour  Intake   1203 ml  Output    599 ml  Net    604 ml   Blood pressure 136/66, pulse 67,  temperature 97.8 F (36.6 C), temperature source Oral, resp. rate 18, height  (1.549 m), weight 190 lb 14.7 oz (86.6 kg), SpO2 100 %. Temp:  [97.4 F (36.3 C)-98.5 F (36.9 C)] 97.8 F (36.6 C) (01/11 1725) Pulse Rate:  [60-68] 67 (01/11 1725) Resp:  [16-18] 18 (01/11 1725) BP: (105-158)/(58-76) 136/66 mmHg (01/11 1725) SpO2:  [100 %] 100 % (01/11 1725) Weight:  [190 lb 14.7 oz (86.6 kg)-193 lb 5.5 oz (87.7 kg)] 190 lb 14.7 oz (86.6 kg) (01/11 1618)  Physical Exam: General: Alert and awake, oriented x3, not in any acute distress. Having echocardiogram performed  Extremities:   See pictures below dated 04/23/2014:   Left foot: hyperpigmented area over 5th digit, LE are cool and pulses in DP are weak        Right foot:      Neuro: nonfocal  CBC:  CBC Latest Ref Rng 04/23/2014 04/22/2014 04/21/2014  WBC 4.0 - 10.5 K/uL 10.2 10.1 10.7(H)  Hemoglobin 12.0 - 15.0 g/dL 10.4(L) 10.3(L) 10.6(L)  Hematocrit 36.0 - 46.0 % 33.0(L) 33.2(L) 33.1(L)  Platelets 150 - 400 K/uL 294 342 315     BMET  Recent Labs  04/22/14 0505 04/23/14 0432  NA 130* 133*  K 5.0 4.1  CL 90* 92*  CO2 21 25  GLUCOSE 361* 48*  BUN 40* 45*  CREATININE 7.23* 8.29*  CALCIUM 9.8 9.7     Liver Panel   Recent Labs  04/21/14 1455 04/23/14 0432  PROT 6.9  --   ALBUMIN 3.1* 2.9*  AST 28  --   ALT 6  --   ALKPHOS 106  --   BILITOT 1.4*  --        Sedimentation Rate  Recent Labs  04/23/14 1130  ESRSEDRATE 53*   C-Reactive Protein No results for input(s): CRP in the last 72 hours.  Micro Results: Recent Results (from the past 720 hour(s))  Blood culture (routine x 2)     Status: None (Preliminary result)   Collection Time: 04/21/14  8:36 PM  Result Value Ref Range Status   Specimen Description BLOOD LEFT HAND  Final   Special Requests BOTTLES DRAWN AEROBIC AND ANAEROBIC 3CC  Final   Culture   Final           BLOOD CULTURE RECEIVED NO GROWTH TO DATE CULTURE WILL BE HELD  FOR 5 DAYS BEFORE ISSUING A FINAL NEGATIVE REPORT Performed at [facility]    Report Status PENDING  Incomplete  Blood culture (routine x 2)     Status: None (Preliminary result)   Collection Time: 04/21/14  9:37 PM  Result Value Ref Range Status   Specimen Description BLOOD LEFT ARM  Final   Special Requests BOTTLES DRAWN AEROBIC ONLY 3CC  Final   Culture   Final           BLOOD CULTURE RECEIVED NO GROWTH TO DATE CULTURE WILL BE HELD FOR 5 DAYS BEFORE ISSUING A FINAL NEGATIVE REPORT Performed at [facility]    Report Status PENDING  Incomplete    Studies/Results: Mr Lumbar Spine Wo Contrast  04/21/2014   CLINICAL DATA:  Worsening back pain and 1 month of bilateral leg weakness, worse today. Fall.  EXAM: MRI LUMBAR SPINE WITHOUT CONTRAST  TECHNIQUE: Multiplanar, multisequence MR imaging of the lumbar spine was performed. No intravenous contrast was administered.  COMPARISON:  Chest radiographs 07/14/2013. Lumbar spine MRI 05/13/2012.  FINDINGS: There is minimal right convex curvature at the thoracolumbar junction. There is no significant listhesis. The visualized portion of the lower thoracic spine demonstrates complete loss of disc space height at T10-11 which is new from prior chest radiographs and was not included on the prior MRI. There are extensive adjacent marrow changes in the T10 and T11 vertebral bodies, predominantly intermediate to low T1 signal intensity and mixed T2 hypo- and hyperintensity. STIR images demonstrate mild edema along the endplates. There does not appear to be a large amount of fluid signal in the disc space, although evaluation is somewhat limited by the essentially complete disc space height loss. There is mild anterior wedging of the T10 vertebral body due to superior endplate compression/ resorption. There is also mild depression versus erosion of the T11 superior endplate. There is circumferential displacement of disc material/bone/other soft tissue  at the T10-11 disc space level including into the ventral spinal canal. Material in the ventral epidural space measures 6 mm in AP thickness and, together with facet arthrosis, results in moderate spinal stenosis with mild-to-moderate impression on the spinal cord. There is also mild right and moderate left neural foraminal stenosis. No discrete paravertebral fluid collection is identified on this unenhanced study.  Vertebral body heights in the lumbar spine are preserved. Multilevel disc desiccation is present, greatest from L3-4 to L5-S1. There is moderate to severe disc space  narrowing at L5-S1, similar to the prior MRI. Mild degenerative marrow changes are present L5-S1 and. Conus medullaris is normal in signal and terminates at L1.  T11-12: Small right foraminal disc protrusion without significant stenosis.  T12-L1:  Negative.  L1-2:  Negative.  L2-3:  Negative.  L3-4: Mild disc bulging asymmetric to the left without significant stenosis, unchanged.  L4-5: Mild circumferential disc bulging, superimposed right central disc extrusion, and facet and ligamentum flavum hypertrophy results in moderate to severe right and moderate left lateral recess stenosis, mild-to-moderate spinal stenosis, and moderate right neural foraminal stenosis, similar to the prior study.  L5-S1: Prior right hemilaminectomy. Left central disc protrusion results in mild to moderate left lateral recess stenosis, not significantly changed. No spinal canal stenosis.  IMPRESSION: 1. Complete loss of disc space height at T10-11 with endplate erosion/depression and extensive surrounding marrow changes. There is circumferential displacement of material/phlegmon including into the ventral epidural space with resultant moderate spinal stenosis. These findings are new from 07/14/2013 chest radiographs and are concerning for discitis/osteomyelitis. Other potential considerations might include rapidly progressive degenerative disc disease and associated  mild compression fractures or dialysis related amyloid spondyloarthropathy. 2. Unchanged moderate disc degeneration in the lower lumbar spine as above. These results were called by telephone at the time of interpretation on 04/21/2014 at 8:09 pm to [facility] , who verbally acknowledged these results.   Electronically Signed   By: Sebastian Ache   On: 04/21/2014 20:17      Assessment/Plan:  Principal Problem:   Diskitis Active Problems:   Hyponatremia   Diabetes mellitus   Malignant hypertension   ESRD on dialysis   Cellulitis and abscess of toe   Anemia of renal disease   Leukocytosis   Elevated troponin   Bradycardia    ATLANTIS DELONG is a 54 y.o. 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 She had a prior infected right chest hemodialysis catheter in September. I called her dialysis center on 1 over [facility] and indeed on 12/14/2013 she had grown coagulase-negative staphylococcal species out of 2 different blood culture sites. She had been started on vancomycin and had cleared her blood cultures she had the catheter removed and there is a catheter tip culture from inpatient world here that is negative. She completed at least a six-week course of antibiotics. She had actually also again been on antibiotics in the form of vancomycin for left toe infection. Her toe has not shown evidence of osteomyelitis on plain films though more aggressive imaging has not yet been performed.  #1 Discitis: It is again unfortunately this patient was given intravenous antibiotics upon admission rather than withholding these antibiotics prior to torrential radiology getting a guided aspirate for culture. The fact she guarded on intravenous vancomycin further complicates matters as well. She will undoubtedly still have vancomycin in her system this weekend until she has had hemodialysis to remove it.  I do still favor however going  forward with IR guided aspirate of the disc space. His initial cultures are not informative we could consider repeat aspirate of the space for culture.  Certainly the prime suspect for infection here would be the coagulase-negative staph that she had in her bloodstream previously although she could've seated again from a different site with a different organism with one potential portal being her foot.  #2 Diabetic foot ulcer: I have engaged diabetic foot focused order set including vascular studies for arterial  brachial indices and toe brachial indices as well as an MRI of the left foot without contrast  #3 patient clearly meets CDC guidelines for HIV testing as well as testing for hepatitis C and I will add these tests to her morning lab work.     LOS: 2 days   Acey LavCornelius Van Dam 04/23/2014, 5:39 PM

## 2014-04-23 NOTE — Progress Notes (Signed)
PROGRESS NOTE  [name] ZOX:[id number] DOB: 1960/10/02 DOA: 04/21/2014 PCP: Pearla Dubonnet, MD  HPI: [name] is a 54 y.o. female with a history of ESRD on HD, HTN, IDDM, Lumbar DDD who presents to the ED with complaints of progressive weakness over the past month in both legs and her knee buckling and precipitating a fall today. She was not able to get up and was brought to the ED. She also had an injury to her Left 5th Toe 1 month ago and she saw her PCP, and over the month she developed increased redness so her dialysis doctor placed over on IV Vancomycin to treat a cellulitis with her dialysis Rx 4 days ago. In the ED tonight, An X-Ray of the Left Foot was performed and was negative for fracture and findings of Osteomyelitis, and an MRI of the Lumbar Spine which revealed +diskitis/Osteomyelitis of the T10-T11 disk. NeuroSurgery Dr. Tressie Stalker was consulted and patient was placed on IV Vancomycin and Rocephin and referred for medical admission  Subjective / 24 H Interval events Endorses nausea this morning, no chest pain/dyspnea  Assessment/Plan: Principal Problem:   Diskitis Active Problems:   Hyponatremia   Diabetes mellitus   Malignant hypertension   ESRD on dialysis   Cellulitis and abscess of toe   Anemia of renal disease   Leukocytosis  Diskitis- on MRI - neurosurgery consulted - consulted ID 1/10, will hold antibiotics for now, patient not septic, will likely need IR for aspiration, off antibiotic until then to improve yield   Cellulitis and abscess of toe - stable, hold antibiotics for now per #1, closely monitor, start if febrile/site gets worse   Elevated troponin - in the setting of infection, ESRD  - no chest pain or discomfort - given bradycardia yesterday and block (as below), will consult cardiology today  - repeat EKG this morning  ESRD on dialysis - She missed her dialysis yesterday, consulted nephrology this  morning  Mobitz type I heart block - On telemetry, reviewed strips with Dr. Rennis Golden from cardiology, this is most consistent with Mobitz type I, I have discontinued her atenolol, will obtain a 2-D echo, continue to closely monitor. She doesn't have any symptoms.   Diabetes mellitus - Continue Levemir Rx, hypoglycemic this morning, will decrease Levemir from 41 to 35  - SSI coverage - Check HbA1C  Malignant hypertension - discontinue Atenolol  - monitor BPs, now controlled  Hyponatremia - Correct with Dialysis  Anemia of Renal Disease -Continue Reno-vite - Monitor H/Hs  Leukocytosis - due to #1 and #2,   Diet: Diet renal/carb modified with 1200 ml fluid restriction Fluids: none DVT Prophylaxis: SCD  Code Status: Full Code Family Communication: none  Disposition Plan: remain inpatient  Consultants:  ID  Nephrology  Cardiology  Procedures:  None    Antibiotics Vancomycin 1/5 >> 1/10 (at prior 2 HD sessions last week) Ceftriaxone 1/9>>1/10   Studies  Mr Lumbar Spine Wo Contrast  04/21/2014   CLINICAL DATA:  Worsening back pain and 1 month of bilateral leg weakness, worse today. Fall.  EXAM: MRI LUMBAR SPINE WITHOUT CONTRAST  TECHNIQUE: Multiplanar, multisequence MR imaging of the lumbar spine was performed. No intravenous contrast was administered.  COMPARISON:  Chest radiographs 07/14/2013. Lumbar spine MRI 05/13/2012.  FINDINGS: There is minimal right convex curvature at the thoracolumbar junction. There is no significant listhesis. The visualized portion of the lower thoracic spine demonstrates complete loss of disc space height at T10-11 which is new from  prior chest radiographs and was not included on the prior MRI. There are extensive adjacent marrow changes in the T10 and T11 vertebral bodies, predominantly intermediate to low T1 signal intensity and mixed T2 hypo- and hyperintensity. STIR images demonstrate mild edema along the endplates.  There does not appear to be a large amount of fluid signal in the disc space, although evaluation is somewhat limited by the essentially complete disc space height loss. There is mild anterior wedging of the T10 vertebral body due to superior endplate compression/ resorption. There is also mild depression versus erosion of the T11 superior endplate. There is circumferential displacement of disc material/bone/other soft tissue at the T10-11 disc space level including into the ventral spinal canal. Material in the ventral epidural space measures 6 mm in AP thickness and, together with facet arthrosis, results in moderate spinal stenosis with mild-to-moderate impression on the spinal cord. There is also mild right and moderate left neural foraminal stenosis. No discrete paravertebral fluid collection is identified on this unenhanced study.  Vertebral body heights in the lumbar spine are preserved. Multilevel disc desiccation is present, greatest from L3-4 to L5-S1. There is moderate to severe disc space narrowing at L5-S1, similar to the prior MRI. Mild degenerative marrow changes are present L5-S1 and. Conus medullaris is normal in signal and terminates at L1.  T11-12: Small right foraminal disc protrusion without significant stenosis.  T12-L1:  Negative.  L1-2:  Negative.  L2-3:  Negative.  L3-4: Mild disc bulging asymmetric to the left without significant stenosis, unchanged.  L4-5: Mild circumferential disc bulging, superimposed right central disc extrusion, and facet and ligamentum flavum hypertrophy results in moderate to severe right and moderate left lateral recess stenosis, mild-to-moderate spinal stenosis, and moderate right neural foraminal stenosis, similar to the prior study.  L5-S1: Prior right hemilaminectomy. Left central disc protrusion results in mild to moderate left lateral recess stenosis, not significantly changed. No spinal canal stenosis.  IMPRESSION: 1. Complete loss of disc space height at  T10-11 with endplate erosion/depression and extensive surrounding marrow changes. There is circumferential displacement of material/phlegmon including into the ventral epidural space with resultant moderate spinal stenosis. These findings are new from 07/14/2013 chest radiographs and are concerning for discitis/osteomyelitis. Other potential considerations might include rapidly progressive degenerative disc disease and associated mild compression fractures or dialysis related amyloid spondyloarthropathy. 2. Unchanged moderate disc degeneration in the lower lumbar spine as above. These results were called by telephone at the time of interpretation on 04/21/2014 at 8:09 pm to [facility] , who verbally acknowledged these results.   Electronically Signed   By: Sebastian AcheAllen  Grady   On: 04/21/2014 20:17   Dg Foot Complete Left  04/21/2014   CLINICAL DATA:  54 year old female with history of trauma from a fall 1 week ago with wound and pain in the left little toe.  EXAM: LEFT FOOT - COMPLETE 3+ VIEW  COMPARISON:  No priors.  FINDINGS: Three views of the left foot are slightly limited by extensive motion on the frontal projection. With these limitations in mind, there is no destructive lytic lesion in the left fifth toe to suggest osteomyelitis. No acute displaced fracture, subluxation or dislocation. Numerous vascular calcifications are noted. Small plantar calcaneal spur.  IMPRESSION: 1. No acute radiographic abnormality of the left foot. Specifically, no signs of osteomyelitis in the left fifth toe. 2. Atherosclerosis.   Electronically Signed   By: Trudie Reedaniel  Entrikin M.D.   On: 04/21/2014 17:29    Objective  Filed Vitals:  04/22/14 0951 04/22/14 1451 04/22/14 2201 04/23/14 0426  BP:  130/65 136/71 158/64  Pulse:  69 60 61  Temp:  97.6 F (36.4 C) 97.5 F (36.4 C) 97.4 F (36.3 C)  TempSrc:  Oral Oral Oral  Resp: Height:      Weight:      SpO2: 97% 99% 100% 100%    Intake/Output Summary  (Last 24 hours) at 04/23/14 0706 Last data filed at 04/23/14 1610  Gross per 24 hour  Intake   1185 ml  Output      0 ml  Net   1185 ml   Filed Weights   04/21/14 1344  Weight: 86.183 kg (190 lb)    Exam:  General:  NAD  HEENT: no scleral icterus  Cardiovascular: RRR, 3/6 SEM  Respiratory: CTA biL  Abdomen: soft, non tender  Skin: no rashes, left 5th toe with cellulitic changes  Neuro: non focal   Data Reviewed: Basic Metabolic Panel:  Recent Labs Lab 04/21/14 1455 04/22/14 0505 04/23/14 0432  NA 128* 130* 133*  K 4.6 5.0 4.1  CL 89* 90* 92*  CO2 GLUCOSE 460* 361* 48*  BUN 33* 40* 45*  CREATININE 6.44* 7.23* 8.29*  CALCIUM 10.0 9.8 9.7  PHOS  --   --  7.1*   Liver Function Tests:  Recent Labs Lab 04/21/14 1455 04/23/14 0432  AST 28  --   ALT 6  --   ALKPHOS 106  --   BILITOT 1.4*  --   PROT 6.9  --   ALBUMIN 3.1* 2.9*   CBC:  Recent Labs Lab 04/21/14 1455 04/22/14 0505 04/23/14 0432  WBC 10.7* 10.1 10.2  NEUTROABS 9.5*  --   --   HGB 10.6* 10.3* 10.4*  HCT 33.1* 33.2* 33.0*  MCV 86.0 86.5 86.8  PLT 315 342 294   BNP (last 3 results)  Recent Labs  07/10/13 1330  PROBNP 15005.0*   CBG:  Recent Labs Lab 04/22/14 1716 04/22/14 2305 04/23/14 0609 04/23/14 0628 04/23/14 0650  GLUCAP 221* 105* 53* 49* 64*   Scheduled Meds: . clonazePAM  0.5 mg Oral QHS  . insulin aspart  0-5 Units Subcutaneous QHS  . insulin aspart  0-9 Units Subcutaneous TID WC  . insulin detemir  35 Units Subcutaneous QHS  . lanthanum  1,000 mg Oral TID WC  . linagliptin  5 mg Oral Daily  . multivitamin  1 tablet Oral QHS  . pantoprazole  40 mg Oral Daily  . sodium chloride  3 mL Intravenous Q12H   Continuous Infusions:   Pamella Pert, MD [facility] Pager [phone]. If 7 PM - 7 AM, please contact night-coverage at [email or web address], password [facility] 04/23/2014, 7:06 AM  LOS: 2 days

## 2014-04-23 NOTE — Progress Notes (Signed)
Echocardiogram 2D Echocardiogram has been performed.  [name] 04/23/2014, 11:30 AM

## 2014-04-23 NOTE — Procedures (Signed)
I was present at this dialysis session, have reviewed the session itself and made  appropriate changes  Vinson Moselleob Emil Klassen MD (pgr) [phone]    ([phone]) [phone] 04/23/2014, 2:20 PM

## 2014-04-23 NOTE — Consult Note (Signed)
Chief Complaint: Chief Complaint  Patient presents with  . Fall  . Extremity Weakness  Discitis  Back pain x 6 weeks  Referring Physician(s): [facility]  History of Present Illness: [name] is a 54 y.o. female   Pt presented with weakness Fall at home Back pain worsening x 6 weeks (hx back surgery 22 yrs ago) Work up included MRI T/L spine Reveals Thoracic 10-11 discitis/osteomyelitis Request for disc aspiration Off antibiotics since 04/22/14---had been treated for R toe cellulitis Dr Corliss Skains has reviewed imaging and approved procedure Now scheduled for same  Past Medical History  Diagnosis Date  . Hypertension   . Anemia   . Diabetes mellitus     Type 2  . Chronic kidney disease     T,Th,Sa dialysis  . GERD (gastroesophageal reflux disease)     protonix for "burping"  . Complication of anesthesia     had trouble being put to sleep and then difficulty waking up. States she doesn't have any issues with Propofol    Past Surgical History  Procedure Laterality Date  . Insertion of dialysis catheter N/A 07/16/2013    Procedure: INSERTION OF DIALYSIS CATHETER;  Surgeon: Larina Earthly, MD;  Location: [facility] OR;  Service: Vascular;  Laterality: N/A;  . Av fistula placement Right 07/18/2013    Procedure: RIGHT ARTERIOVENOUS (AV) FISTULA CREATION;  Surgeon: Sherren Kerns, MD;  Location: [facility] OR;  Service: Vascular;  Laterality: Right;  . Insertion of dialysis catheter Left 12/22/2013    Procedure: INSERTION OF DIALYSIS CATHETER;  Surgeon: Larina Earthly, MD;  Location: [facility] OR;  Service: Vascular;  Laterality: Left;  . Back surgery  17 years    lumbar surgery  . Eye surgery Bilateral     cataract surgery with lens implants  . Hysteroscopy w/ endometrial ablation    . Fistula superficialization Right 02/16/2014    Procedure:  RIGHT Fistula BRACHIOCEPHALIC Superficialization;  Surgeon: Chuck Hint, MD;  Location: [facility] OR;  Service: Vascular;  Laterality: Right;  .  Fistulogram N/A 01/15/2014    Procedure: FISTULOGRAM;  Surgeon: Chuck Hint, MD;  Location: [facility] CATH LAB;  Service: Cardiovascular;  Laterality: N/A;    Allergies: Codeine  Medications: Prior to Admission medications   Medication Sig Start Date End Date Taking? Authorizing Provider  atenolol (TENORMIN) 25 MG tablet Take 1 tablet (25 mg total) by mouth at bedtime. 07/26/13  Yes Marden Noble, MD  b complex-vitamin c-folic acid (NEPHRO-VITE) 0.8 MG TABS tablet Take 1 tablet by mouth at bedtime.   Yes Historical Provider, MD  clonazePAM (KLONOPIN) 0.5 MG tablet Take 0.5 mg by mouth at bedtime.   Yes Historical Provider, MD  famotidine (PEPCID) 20 MG tablet Take 20 mg by mouth at bedtime.    Yes Historical Provider, MD  HYDROcodone-acetaminophen (NORCO/VICODIN) 5-325 MG per tablet Take 1 tablet by mouth at bedtime as needed for moderate pain.    Yes Historical Provider, MD  ibuprofen (ADVIL,MOTRIN) 200 MG tablet Take 400-600 mg by mouth every 8 (eight) hours as needed for moderate pain.    Yes Historical Provider, MD  insulin aspart (NOVOLOG) 100 UNIT/ML injection Inject 0-12 Units into the skin 4 (four) times daily - after meals and at bedtime. Per sliding scale. Usually takes last dose around 10pm   Yes Historical Provider, MD  insulin detemir (LEVEMIR) 100 UNIT/ML injection Inject 40-42 Units into the skin at bedtime. Usually takes around midnight   Yes Historical Provider, MD  lanthanum (FOSRENOL) 1000  MG chewable tablet Chew 1,000 mg by mouth See admin instructions. Takes with meals and snacks   Yes Historical Provider, MD  linagliptin (TRADJENTA) 5 MG TABS tablet Take 1 tablet (5 mg total) by mouth daily. 07/26/13  Yes Marden Noble, MD  oxyCODONE-acetaminophen (ROXICET) 5-325 MG per tablet Take 1-2 tablets by mouth every 4 (four) hours as needed for severe pain. 02/16/14  Yes Chuck Hint, MD  pantoprazole (PROTONIX) 40 MG tablet Take 1 tablet (40 mg total) by mouth daily.  07/26/13  Yes Marden Noble, MD  promethazine (PHENERGAN) 25 MG tablet Take 1 tablet (25 mg total) by mouth every 6 (six) hours as needed for nausea or vomiting. 07/26/13  Yes Marden Noble, MD  Turmeric Curcumin 500 MG CAPS Take 500 mg by mouth daily.   Yes Historical Provider, MD  zaleplon (SONATA) 10 MG capsule Take 10 mg by mouth at bedtime as needed for sleep.   Yes Historical Provider, MD  doxercalciferol (HECTOROL) 4 MCG/2ML injection Inject 2 mLs (4 mcg total) into the vein Every Tuesday,Thursday,and Saturday with dialysis. 07/26/13   Marden Noble, MD  ferric gluconate 125 mg in sodium chloride 0.9 % 100 mL Inject 125 mg into the vein Every Tuesday,Thursday,and Saturday with dialysis. 07/26/13   Marden Noble, MD  iron sucrose in sodium chloride 0.9 % 100 mL Inject 100 mg into the vein once a week. Receives at dialysis    Historical Provider, MD    Family History  Problem Relation Age of Onset  . Fibromyalgia Mother   . Ovarian cancer Mother   . Rectal cancer Mother   . Parkinson's disease Father     History   Social History  . Marital Status: Single    Spouse Name: N/A    Number of Children: N/A  . Years of Education: N/A   Social History Main Topics  . Smoking status: Never Smoker   . Smokeless tobacco: Never Used  . Alcohol Use: No  . Drug Use: No  . Sexual Activity: No   Other Topics Concern  . None   Social History Narrative     Review of Systems: A 12 point ROS discussed and pertinent positives are indicated in the HPI above.  All other systems are negative.  Review of Systems  Constitutional: Positive for activity change and fatigue. Negative for fever.  Respiratory: Negative for shortness of breath.   Gastrointestinal: Positive for abdominal pain.  Musculoskeletal: Positive for back pain and gait problem.  Neurological: Positive for weakness. Negative for dizziness.  Psychiatric/Behavioral: Negative for behavioral problems and confusion.    Vital Signs: BP  158/64 mmHg  Pulse 61  Temp(Src) 97.4 F (36.3 C) (Oral)  Resp 18  Ht 5\' 1"  (1.549 m)  Wt 86.183 kg (190 lb)  BMI 35.92 kg/m2  SpO2 100%  Physical Exam  Cardiovascular: Normal rate and regular rhythm.   No murmur heard. Pulmonary/Chest: Effort normal and breath sounds normal. She has no wheezes.  Abdominal: Soft. Bowel sounds are normal. There is no tenderness.  Musculoskeletal: Normal range of motion.  Back pain  Neurological: She is alert.  Skin: Skin is warm and dry.  Psychiatric: She has a normal mood and affect. Her behavior is normal. Judgment and thought content normal.  Nursing note and vitals reviewed.   Imaging: Mr Lumbar Spine Wo Contrast  04/21/2014   CLINICAL DATA:  Worsening back pain and 1 month of bilateral leg weakness, worse today. Fall.  EXAM: MRI LUMBAR SPINE WITHOUT CONTRAST  TECHNIQUE: Multiplanar, multisequence MR imaging of the lumbar spine was performed. No intravenous contrast was administered.  COMPARISON:  Chest radiographs 07/14/2013. Lumbar spine MRI 05/13/2012.  FINDINGS: There is minimal right convex curvature at the thoracolumbar junction. There is no significant listhesis. The visualized portion of the lower thoracic spine demonstrates complete loss of disc space height at T10-11 which is new from prior chest radiographs and was not included on the prior MRI. There are extensive adjacent marrow changes in the T10 and T11 vertebral bodies, predominantly intermediate to low T1 signal intensity and mixed T2 hypo- and hyperintensity. STIR images demonstrate mild edema along the endplates. There does not appear to be a large amount of fluid signal in the disc space, although evaluation is somewhat limited by the essentially complete disc space height loss. There is mild anterior wedging of the T10 vertebral body due to superior endplate compression/ resorption. There is also mild depression versus erosion of the T11 superior endplate. There is circumferential  displacement of disc material/bone/other soft tissue at the T10-11 disc space level including into the ventral spinal canal. Material in the ventral epidural space measures 6 mm in AP thickness and, together with facet arthrosis, results in moderate spinal stenosis with mild-to-moderate impression on the spinal cord. There is also mild right and moderate left neural foraminal stenosis. No discrete paravertebral fluid collection is identified on this unenhanced study.  Vertebral body heights in the lumbar spine are preserved. Multilevel disc desiccation is present, greatest from L3-4 to L5-S1. There is moderate to severe disc space narrowing at L5-S1, similar to the prior MRI. Mild degenerative marrow changes are present L5-S1 and. Conus medullaris is normal in signal and terminates at L1.  T11-12: Small right foraminal disc protrusion without significant stenosis.  T12-L1:  Negative.  L1-2:  Negative.  L2-3:  Negative.  L3-4: Mild disc bulging asymmetric to the left without significant stenosis, unchanged.  L4-5: Mild circumferential disc bulging, superimposed right central disc extrusion, and facet and ligamentum flavum hypertrophy results in moderate to severe right and moderate left lateral recess stenosis, mild-to-moderate spinal stenosis, and moderate right neural foraminal stenosis, similar to the prior study.  L5-S1: Prior right hemilaminectomy. Left central disc protrusion results in mild to moderate left lateral recess stenosis, not significantly changed. No spinal canal stenosis.  IMPRESSION: 1. Complete loss of disc space height at T10-11 with endplate erosion/depression and extensive surrounding marrow changes. There is circumferential displacement of material/phlegmon including into the ventral epidural space with resultant moderate spinal stenosis. These findings are new from 07/14/2013 chest radiographs and are concerning for discitis/osteomyelitis. Other potential considerations might include rapidly  progressive degenerative disc disease and associated mild compression fractures or dialysis related amyloid spondyloarthropathy. 2. Unchanged moderate disc degeneration in the lower lumbar spine as above. These results were called by telephone at the time of interpretation on 04/21/2014 at 8:09 pm to [facility] , who verbally acknowledged these results.   Electronically Signed   By: Sebastian Ache   On: 04/21/2014 20:17   Dg Foot Complete Left  04/21/2014   CLINICAL DATA:  54 year old female with history of trauma from a fall 1 week ago with wound and pain in the left little toe.  EXAM: LEFT FOOT - COMPLETE 3+ VIEW  COMPARISON:  No priors.  FINDINGS: Three views of the left foot are slightly limited by extensive motion on the frontal projection. With these limitations in mind, there is no destructive lytic lesion in the left fifth toe to suggest  osteomyelitis. No acute displaced fracture, subluxation or dislocation. Numerous vascular calcifications are noted. Small plantar calcaneal spur.  IMPRESSION: 1. No acute radiographic abnormality of the left foot. Specifically, no signs of osteomyelitis in the left fifth toe. 2. Atherosclerosis.   Electronically Signed   By: Trudie Reedaniel  Entrikin M.D.   On: 04/21/2014 17:29    Labs:  CBC:  Recent Labs  07/25/13 0616  02/16/14 0858 04/21/14 1455 04/22/14 0505 04/23/14 0432  WBC 7.9  --   --  10.7* 10.1 10.2  HGB 8.8*  < > 13.3 10.6* 10.3* 10.4*  HCT 27.2*  < > 39.0 33.1* 33.2* 33.0*  PLT 231  --   --  315 342 294  < > = values in this interval not displayed.  COAGS:  Recent Labs  04/21/14 1455  APTT 31    BMP:  Recent Labs  07/25/13 0616  01/15/14 0735 02/16/14 0858 04/21/14 1455 04/22/14 0505 04/23/14 0432  NA 133*  < > 132* 133* 128* 130* 133*  K 4.2  < > 4.6 4.0 4.6 5.0 4.1  CL 92*  --  92*  --  89* 90* 92*  CO2 24  --   --   --  21 21 25   GLUCOSE 169*  < > 342* 282* 460* 361* 48*  BUN 54*  --  47*  --  33* 40* 45*  CALCIUM 8.8   --   --   --  10.0 9.8 9.7  CREATININE 8.06*  --  6.80*  --  6.44* 7.23* 8.29*  GFRNONAA 5*  --   --   --  7* 6* 5*  GFRAA 6*  --   --   --  8* 7* 6*  < > = values in this interval not displayed.  LIVER FUNCTION TESTS:  Recent Labs  07/10/13 1330  07/19/13 1705 07/21/13 0655 07/25/13 0616 04/21/14 1455 04/23/14 0432  BILITOT <0.2*  --  <0.2*  --   --  1.4*  --   AST 12  --  12  --   --  28  --   ALT 17  --  7  --   --  6  --   ALKPHOS 130*  --  106  --   --  106  --   PROT 6.1  --  6.1  --   --  6.9  --   ALBUMIN 2.6*  < > 2.4* 2.2* 2.5* 3.1* 2.9*  < > = values in this interval not displayed.  TUMOR MARKERS: No results for input(s): AFPTM, CEA, CA199, CHROMGRNA in the last 8760 hours.  Assessment and Plan:  Back pain MRI reveals Thoracic 10-11 discitis Scheduled for disc aspiration 1/12 in IR LD antibiotic 04/22/14 Pt aware of procedure benefits and risks and agreeable to proceed Consent signed and in chart   Thank you for this interesting consult.  I greatly enjoyed meeting [name] and look forward to participating in their care.     I spent a total of 20 minutes face to face in clinical consultation, greater than 50% of which was counseling/coordinating care for T10-11 disc asp  Signed: TURPIN,PAMELA A 04/23/2014, 12:08 PM

## 2014-04-23 NOTE — Progress Notes (Signed)
Subjective:  Co leg weakness/ "probably not able to stand without falling"  Objective Vital signs in last 24 hours: Filed Vitals:   04/22/14 0951 04/22/14 1451 04/22/14 2201 04/23/14 0426  BP:  130/65 136/71 158/64  Pulse:  69 60 61  Temp:  97.6 F (36.4 C) 97.5 F (36.4 C) 97.4 F (36.3 C)  TempSrc:  Oral Oral Oral  Resp: 16 16 16 18   Height:      Weight:      SpO2: 97% 99% 100% 100%   Weight change:   Physical Exam: General: alert Obese, WF , sitting  on edge of bed , NAD , BUT Cannot lay flat sec to SOB-  Heart:RRR, no mur, rub or gallop Lungs: CTA bilat, non labored breathing  Abdomen: Obese, BS pos, soft NT/ ND Extremities: L 5th toe Echymosis/trace bipedal edema  Dialysis Access: pos bruit RUAAVF / L  IJ  perm cath  Dialysis Orders: [facility] 85 kgs 2K/2ca 4 hr 400/800 6200 Heparin. L IJ cath and R AVF Calcitriol 0.75 Aranesp 25 q week.  Problem/Plan: 1. Fall/LE weakness- discitis on MRI,  ID seeing holding Antib for now-  For BxT10-11 disc by IR in AM/antibiotics currently on hold per primary. [facility], Neurosurgery also consulted- has history of lumbar laminectomy about 20 years ago. Had lumbar degenerative changes on MRI Jan 2014. 2. Bradycardia/Heart block- atenolol Dc'd. Echo just done pending. asymptomatic 3. Left toe ulcer/cellulitis- no acute abnormality and No osteomyelitis on xray- received 2 doses of cefazolin at outpt center 4. ESRD - T[facility] East./ HD today and in am to keep on schedule  K+ 4.1 5. Hypertension/volume - 158/64  atenolol dc'd. 1 kg over edw.by Bed wt / Left slightly under edw last week- probably  need decrease 6. Anemia - hgb 10.4. Cont Aranesp weekly.  7. Metabolic bone disease - Ca+ 9.8/10.5 corrected.phos 7.1 Cont fosrenol.- last phos 8.6(compliance problems with binders). Last PTH 158- dc calcitriol for now 8. Nutrition - Alb 3.1.2.9  renal diet. Vit and nepro supplement 9. DM- per  primary  Katherine Pastelavid Zeyfang, PA-C [facility] Beeper [phone] 04/23/2014,11:40 AM  LOS: 2 days   Pt seen, examined and agree w A/P as above.  Vinson Moselleob Olie Scaffidi MD pager [phone]    cell [phone] 04/23/2014, 1:15 PM    Labs: Basic Metabolic Panel:  Recent Labs Lab 04/21/14 1455 04/22/14 0505 04/23/14 0432  NA 128* 130* 133*  K 4.6 5.0 4.1  CL 89* 90* 92*  CO2 21 21 25   GLUCOSE 460* 361* 48*  BUN 33* 40* 45*  CREATININE 6.44* 7.23* 8.29*  CALCIUM 10.0 9.8 9.7  PHOS  --   --  7.1*   Liver Function Tests:  Recent Labs Lab 04/21/14 1455 04/23/14 0432  AST 28  --   ALT 6  --   ALKPHOS 106  --   BILITOT 1.4*  --   PROT 6.9  --   ALBUMIN 3.1* 2.9*   CBC:  Recent Labs Lab 04/21/14 1455 04/22/14 0505 04/23/14 0432  WBC 10.7* 10.1 10.2  NEUTROABS 9.5*  --   --   HGB 10.6* 10.3* 10.4*  HCT 33.1* 33.2* 33.0*  MCV 86.0 86.5 86.8  PLT 315 342 294   Cardiac Enzymes:  Recent Labs Lab 04/22/14 1215 04/23/14 0432  TROPONINI 0.08* 0.27*   CBG:  Recent Labs Lab 04/22/14 2305 04/23/14 0609 04/23/14 0628 04/23/14 0650 04/23/14 0708  GLUCAP 105* 53* 49* 64* 95  Medications:   . clonazePAM  0.5 mg Oral QHS  . insulin aspart  0-5 Units Subcutaneous QHS  . insulin aspart  0-9 Units Subcutaneous TID WC  . insulin detemir  35 Units Subcutaneous QHS  . lanthanum  1,000 mg Oral TID WC  . linagliptin  5 mg Oral Daily  . multivitamin  1 tablet Oral QHS  . mupirocin cream   Topical BID  . pantoprazole  40 mg Oral Daily  . sodium chloride  3 mL Intravenous Q12H

## 2014-04-23 NOTE — Consult Note (Signed)
CARDIOLOGY CONSULT NOTE       Patient ID: [name] MRN: [id number] DOB/AGE: [id number] 55 y.o.  Admit date: 04/21/2014 Referring Physician:  Elvera Lennox Primary Physician: Pearla Dubonnet, MD Primary Cardiologist:  New Reason for Consultation:  Elevated Troponin  Principal Problem:   Diskitis Active Problems:   Hyponatremia   Diabetes mellitus   Malignant hypertension   ESRD on dialysis   Cellulitis and abscess of toe   Anemia of renal disease   Leukocytosis   HPI:   54 yo no history of CAD  CRF ? Secondary to poorly controlled DM and HTN.  On dialysis since last April  Issues with RUE access recently revised by Dr Edilia Bo.  Admitted with fall LE weakness and wound on left 5th toe.  W/U so far with ? Discitis  History of lumbar surgery Dr Gerlene Fee.  Had right subclavian vascath removed about a month ago for infection and left subclavian placed  No cardiac symptoms  Not clear why troponins checked  Mention of bradycardia but ECG x 2 ok with no AV block No chest pain.  Mild chronic exertional dyspnea if she misses dialysis from volume overload.  Getting Rx with antibiotics  Telemetry NSR no arrhythmia  ECG no acute changes.  Echo pending   ROS All other systems reviewed and negative except as noted above  Past Medical History  Diagnosis Date  . Hypertension   . Anemia   . Diabetes mellitus     Type 2  . Chronic kidney disease     T,Th,Sa dialysis  . GERD (gastroesophageal reflux disease)     protonix for "burping"  . Complication of anesthesia     had trouble being put to sleep and then difficulty waking up. States she doesn't have any issues with Propofol    Family History  Problem Relation Age of Onset  . Fibromyalgia Mother   . Ovarian cancer Mother   . Rectal cancer Mother   . Parkinson's disease Father     History   Social History  . Marital Status: Single    Spouse Name: N/A    Number of Children: N/A  . Years of Education: N/A   Occupational  History  . Not on file.   Social History Main Topics  . Smoking status: Never Smoker   . Smokeless tobacco: Never Used  . Alcohol Use: No  . Drug Use: No  . Sexual Activity: No   Other Topics Concern  . Not on file   Social History Narrative    Past Surgical History  Procedure Laterality Date  . Insertion of dialysis catheter N/A 07/16/2013    Procedure: INSERTION OF DIALYSIS CATHETER;  Surgeon: Larina Earthly, MD;  Location: [facility] OR;  Service: Vascular;  Laterality: N/A;  . Av fistula placement Right 07/18/2013    Procedure: RIGHT ARTERIOVENOUS (AV) FISTULA CREATION;  Surgeon: Sherren Kerns, MD;  Location: [facility] OR;  Service: Vascular;  Laterality: Right;  . Insertion of dialysis catheter Left 12/22/2013    Procedure: INSERTION OF DIALYSIS CATHETER;  Surgeon: Larina Earthly, MD;  Location: [facility] OR;  Service: Vascular;  Laterality: Left;  . Back surgery  17 years    lumbar surgery  . Eye surgery Bilateral     cataract surgery with lens implants  . Hysteroscopy w/ endometrial ablation    . Fistula superficialization Right 02/16/2014    Procedure:  RIGHT Fistula BRACHIOCEPHALIC Superficialization;  Surgeon: Chuck Hint, MD;  Location: [facility] OR;  Service:  Vascular;  Laterality: Right;  . Fistulogram N/A 01/15/2014    Procedure: FISTULOGRAM;  Surgeon: Chuck Hinthristopher S Dickson, MD;  [facility];  Service: Cardiovascular;  Laterality: N/A;     . clonazePAM  0.5 mg Oral QHS  . insulin aspart  0-5 Units Subcutaneous QHS  . insulin aspart  0-9 Units Subcutaneous TID WC  . insulin detemir  35 Units Subcutaneous QHS  . lanthanum  1,000 mg Oral TID WC  . linagliptin  5 mg Oral Daily  . multivitamin  1 tablet Oral QHS  . mupirocin cream   Topical BID  . pantoprazole  40 mg Oral Daily  . sodium chloride  3 mL Intravenous Q12H      Physical Exam: Blood pressure 158/64, pulse 61, temperature 97.4 F (36.3 C), temperature source Oral, resp. rate 18, height 5\' 1"  (1.549 m), weight  86.183 kg (190 lb), SpO2 100 %.    Affect appropriate Chronically ill white female  HEENT: normal Neck supple with no adenopathy JVP normal no bruits no thyromegaly Lungs clear with no wheezing and good diaphragmatic motion Heart:  S1/S2 no murmur, no rub, gallop or click PMI normal Abdomen: benighn, BS positve, no tenderness, no AAA no bruit.  No HSM or HJR Fistula RUE  vascath left subclavian  No edema Neuro non-focal Echymosis left fifth toe with palpable PT/DP  No muscular weakness   Telemetry :  NSR no arrhythmia  ECG:  NSR No acute ST changes no Heart Block   Impression:  Elevated Troponin:  Not significant in dialysis patient with no symptoms  Echo pending no further w/u if normal EF ID:  Concern with recent bacteremia  Echo r/o SBE no loud murmurs  D/c left subclavian vascath when reliable 2 needle access from fistula currently not on antibiotics DM:  Continue current medications SS insulin Bradycardia:  Resolved no AV block no need for telemetry at this point  BP:  ? Start ACE or amlodipine   Charlton HawsPeter Kiptyn Rafuse

## 2014-04-23 NOTE — Progress Notes (Signed)
INITIAL NUTRITION ASSESSMENT  DOCUMENTATION CODES Per approved criteria  -Obesity Unspecified   INTERVENTION: Provide Nepro Shake po BID, each supplement provides 425 kcal and 19 grams protein  Encourage adequate PO intake.   NUTRITION DIAGNOSIS: Increased nutrient needs related to wound healing as evidenced by estimated nutrition needs.   Goal: Pt to meet >/= 90% of their estimated nutrition needs   Monitor:  PO intake, weight trends, labs, I/O's  Reason for Assessment: MD consult for wound healing  54 y.o. female  Admitting Dx: Diskitis  ASSESSMENT: Pt with a history of ESRD on HD, HTN, IDDM, Lumbar DDD who presents to the ED with complaints of progressive weakness over the past month in both legs and her knee buckling and precipitating a fall. She also had an injury to her Left 5th Toe with increasing redness. MRI reveals thoracic 10-11 discitis.   Pt scheduled for disc aspiration 1/12. Pt reports having a great appetite currently and PTA at home eating 3 meals a day along with 1-2 Glucerna High protein Shakes. Meal completion however has been 25%. Weight has been stable. Pt is agreeable to Nepro Shakes to help in caloric and protein needs especially for wound healing. RD to order. Pt was encouraged to eat her food at meals and to drink her supplements.   Pt with no observed significant fat or muscle mass loss.  Labs: Low sodium, chloride, and GFR. High BUN, creatinine, and phosphorous (7.1).  Height: Ht Readings from Last 1 Encounters:  04/21/14  (1.549 m)    Weight: Wt Readings from Last 1 Encounters:  04/21/14 190 lb (86.183 kg)    Ideal Body Weight: 105 lbs  % Ideal Body Weight: 181%  Wt Readings from Last 10 Encounters:  04/21/14 190 lb (86.183 kg)  04/11/14 192 lb (87.091 kg)  02/16/14 188 lb (85.276 kg)  01/15/14 190 lb (86.183 kg)  01/10/14 190 lb 14.4 oz (86.592 kg)  12/22/13 188 lb (85.276 kg)  10/04/13 183 lb 3.2 oz (83.099 kg)  07/25/13  185 lb 3 oz (84 kg)  09/06/11 190 lb 7.6 oz (86.4 kg)    Usual Body Weight: 190 lbs  % Usual Body Weight: 100%  BMI:  Body mass index is 35.92 kg/(m^2). Class II obesity  Adjusted body weight: 79.75 kg  Estimated Nutritional Needs: Kcal: 2100-2300 Protein: 95-105 grams Fluid: 1.2 L/day  Skin: diabetic ulcer on toe, +1 LLE edema  Diet Order: Diet renal/carb modified with 1200 ml fluid restriction  EDUCATION NEEDS: -No education needs identified at this time   Intake/Output Summary (Last 24 hours) at 04/23/14 1116 Last data filed at 04/23/14 0900  Gross per 24 hour  Intake   1200 ml  Output      0 ml  Net   1200 ml    Last BM: 1/8  Labs:   Recent Labs Lab 04/21/14 1455 04/22/14 0505 04/23/14 0432  NA 128* 130* 133*  K 4.6 5.0 4.1  CL 89* 90* 92*  CO2 BUN 33* 40* 45*  CREATININE 6.44* 7.23* 8.29*  CALCIUM 10.0 9.8 9.7  PHOS  --   --  7.1*  GLUCOSE 460* 361* 48*    CBG (last 3)   Recent Labs  04/23/14 0628 04/23/14 0650 04/23/14 0708  GLUCAP 49* 64* 95    Scheduled Meds: . clonazePAM  0.5 mg Oral QHS  . insulin aspart  0-5 Units Subcutaneous QHS  . insulin aspart  0-9 Units Subcutaneous TID WC  .  insulin detemir  35 Units Subcutaneous QHS  . lanthanum  1,000 mg Oral TID WC  . linagliptin  5 mg Oral Daily  . multivitamin  1 tablet Oral QHS  . mupirocin cream   Topical BID  . pantoprazole  40 mg Oral Daily  . sodium chloride  3 mL Intravenous Q12H    Continuous Infusions:   Past Medical History  Diagnosis Date  . Hypertension   . Anemia   . Diabetes mellitus     Type 2  . Chronic kidney disease     T,Th,Sa dialysis  . GERD (gastroesophageal reflux disease)     protonix for "burping"  . Complication of anesthesia     had trouble being put to sleep and then difficulty waking up. States she doesn't have any issues with Propofol    Past Surgical History  Procedure Laterality Date  . Insertion of dialysis catheter N/A  07/16/2013    Procedure: INSERTION OF DIALYSIS CATHETER;  Surgeon: Larina Earthlyodd F Early, MD;  [facility];  Service: Vascular;  Laterality: N/A;  . Av fistula placement Right 07/18/2013    Procedure: RIGHT ARTERIOVENOUS (AV) FISTULA CREATION;  Surgeon: Sherren Kernsharles E Fields, MD;  [facility];  Service: Vascular;  Laterality: Right;  . Insertion of dialysis catheter Left 12/22/2013    Procedure: INSERTION OF DIALYSIS CATHETER;  Surgeon: Larina Earthlyodd F Early, MD;  [facility];  Service: Vascular;  Laterality: Left;  . Back surgery  17 years    lumbar surgery  . Eye surgery Bilateral     cataract surgery with lens implants  . Hysteroscopy w/ endometrial ablation    . Fistula superficialization Right 02/16/2014    Procedure:  RIGHT Fistula BRACHIOCEPHALIC Superficialization;  Surgeon: Chuck Hinthristopher S Dickson, MD;  [facility];  Service: Vascular;  Laterality: Right;  . Fistulogram N/A 01/15/2014    Procedure: FISTULOGRAM;  Surgeon: Chuck Hinthristopher S Dickson, MD;  [facility];  Service: Cardiovascular;  Laterality: N/A;    Marijean NiemannStephanie La, MS, RD, LDN Pager # [phone] After hours/ weekend pager # [phone]

## 2014-04-23 NOTE — Consult Note (Signed)
WOC wound consult note Reason for Consult: Consult requested for left 5th toe wound.  Pt has been on IV antibiotics and states her toe has greatly improved.  X-ray does not show osteomyelitis. Wound type: Previous cellulitis has evolved into outer dry scabbed area Measurement: Affected area 2X1.5cm, entire left small toe Wound bed: Dark reddish brown scab Drainage (amount, consistency, odor) No open wound, odor, or drainage Periwound: Previous edema to left foot is resolving, according to patient Dressing procedure/placement/frequency: Bactroban to provide antimicrobial benefits and promote moist healing to scabbed area. Discussed plan of care with pt and she verbalizes understanding and denies further questions. Please re-consult if further assistance is needed.  Thank-you,  Cammie Mcgeeawn Benaiah Behan MSN, RN, CWOCN, [place]AP, CNS [phone]

## 2014-04-24 ENCOUNTER — Inpatient Hospital Stay (HOSPITAL_COMMUNITY): Payer: Medicaid Other

## 2014-04-24 DIAGNOSIS — I7389 Other specified peripheral vascular diseases: Secondary | ICD-10-CM

## 2014-04-24 DIAGNOSIS — M4646 Discitis, unspecified, lumbar region: Secondary | ICD-10-CM

## 2014-04-24 DIAGNOSIS — E11621 Type 2 diabetes mellitus with foot ulcer: Secondary | ICD-10-CM

## 2014-04-24 DIAGNOSIS — M79609 Pain in unspecified limb: Secondary | ICD-10-CM

## 2014-04-24 DIAGNOSIS — L97509 Non-pressure chronic ulcer of other part of unspecified foot with unspecified severity: Secondary | ICD-10-CM

## 2014-04-24 LAB — COMPREHENSIVE METABOLIC PANEL
ALT: 9 U/L (ref 0–35)
ANION GAP: 15 (ref 5–15)
AST: 60 U/L — ABNORMAL HIGH (ref 0–37)
Albumin: 2.9 g/dL — ABNORMAL LOW (ref 3.5–5.2)
Alkaline Phosphatase: 118 U/L — ABNORMAL HIGH (ref 39–117)
BUN: 31 mg/dL — AB (ref 6–23)
CO2: 24 mmol/L (ref 19–32)
CREATININE: 7.12 mg/dL — AB (ref 0.50–1.10)
Calcium: 8.8 mg/dL (ref 8.4–10.5)
Chloride: 91 mEq/L — ABNORMAL LOW (ref 96–112)
GFR calc non Af Amer: 6 mL/min — ABNORMAL LOW (ref >90)
GFR, EST AFRICAN AMERICAN: 7 mL/min — AB (ref >90)
GLUCOSE: 312 mg/dL — AB (ref 70–99)
Potassium: 4.8 mmol/L (ref 3.5–5.1)
SODIUM: 130 mmol/L — AB (ref 135–145)
TOTAL PROTEIN: 6.5 g/dL (ref 6.0–8.3)
Total Bilirubin: 1 mg/dL (ref 0.3–1.2)

## 2014-04-24 LAB — CBC
HCT: 33.9 % — ABNORMAL LOW (ref 36.0–46.0)
Hemoglobin: 10.5 g/dL — ABNORMAL LOW (ref 12.0–15.0)
MCH: 27.6 pg (ref 26.0–34.0)
MCHC: 31 g/dL (ref 30.0–36.0)
MCV: 89 fL (ref 78.0–100.0)
Platelets: 269 10*3/uL (ref 150–400)
RBC: 3.81 MIL/uL — ABNORMAL LOW (ref 3.87–5.11)
RDW: 19.3 % — AB (ref 11.5–15.5)
WBC: 9.3 10*3/uL (ref 4.0–10.5)

## 2014-04-24 LAB — GLUCOSE, CAPILLARY
Glucose-Capillary: 251 mg/dL — ABNORMAL HIGH (ref 70–99)
Glucose-Capillary: 136 mg/dL — ABNORMAL HIGH (ref 70–99)
Glucose-Capillary: 167 mg/dL — ABNORMAL HIGH (ref 70–99)
Glucose-Capillary: 330 mg/dL — ABNORMAL HIGH (ref 70–99)

## 2014-04-24 LAB — HIV ANTIBODY (ROUTINE TESTING W REFLEX): HIV-1/HIV-2 Ab: NONREACTIVE

## 2014-04-24 MED ORDER — PROMETHAZINE HCL 25 MG/ML IJ SOLN
INTRAMUSCULAR | Status: AC
  Filled 2014-04-24: qty 1

## 2014-04-24 MED ORDER — PROMETHAZINE HCL 25 MG PO TABS
ORAL_TABLET | ORAL | Status: AC
Start: 2014-04-24 — End: 2014-04-24
  Filled 2014-04-24: qty 1

## 2014-04-24 MED ORDER — HYDROMORPHONE HCL 1 MG/ML IJ SOLN
INTRAMUSCULAR | Status: AC
  Filled 2014-04-24: qty 1

## 2014-04-24 NOTE — Progress Notes (Signed)
[facility] Progress Note   Subjective: Lots of nausea this am, L foot pain worse. Says she doesn't remember coming to dialysis.  Nausea better w meds  Filed Vitals:   04/24/14 0700 04/24/14 0730 04/24/14 0800 04/24/14 0830  BP: 106/57 102/57 134/66 116/60  Pulse: 74 73 70 72  Temp:      TempSrc:      Resp:      Height:      Weight:      SpO2:       Exam: Alert, awake, not in distress No jvd Chest clear bilat RRR no MRG Abd soft, NTND, +BS L 5th toe w mild erythema, purplish discoloration at base of toe, no drainage or odor No LE edema Tender L calf w possible palpable cord and no erythema Neuro is nf, Ox 3  CXR 1/11 - no active disease  HD: [facility] 85 kgs 2K/2ca 4 hr 400/800 6200 Heparin. L IJ cath and R AVF Calcitriol 0.75 Aranesp 25 q week.        Assessment: 1. Repeated falls/ LE weakness - found to have T10/11 discitis /osteomyelitis by MRI 2. 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 Culture paperwork on shadow chart.  3. L foot/ toe infection - no osteo by plain films 4. L calf pain/ tenderness - will get doppler 5. ESRD on HD TTS 6. DM on insulin/ gliptin 7. HTN/volume - no vol excess, BP's soft, at dry wt, no meds 8. Anemia Hb 10.5, holding aranesp  Plan- HD today, minimal UF. LLE doppler ordered    Vinson Moselleob Dashawn Bartnick MD  pager [phone]    cell [phone]  04/24/2014, 9:00 AM     Recent Labs Lab 04/22/14 0505 04/23/14 0432 04/24/14 0500  NA 130* 133* 130*  K 5.0 4.1 4.8  CL 90* 92* 91*  CO2 21 25 24   GLUCOSE 361* 48* 312*  BUN 40* 45* 31*  CREATININE 7.23* 8.29* 7.12*  CALCIUM 9.8 9.7 8.8  PHOS  --  7.1*  --     Recent Labs Lab 04/21/14 1455 04/23/14 0432 04/24/14 0500  AST 28  --  60*  ALT 6  --  9  ALKPHOS 106  --  118*  BILITOT 1.4*  --  1.0  PROT 6.9  --  6.5  ALBUMIN 3.1* 2.9* 2.9*    Recent Labs Lab  04/21/14 1455 04/22/14 0505 04/23/14 0432 04/24/14 0500  WBC 10.7* 10.1 10.2 9.3  NEUTROABS 9.5*  --   --   --   HGB 10.6* 10.3* 10.4* 10.5*  HCT 33.1* 33.2* 33.0* 33.9*  MCV 86.0 86.5 86.8 89.0  PLT 315 342 294 269   . clonazePAM  0.5 mg Oral QHS  . feeding supplement (NEPRO CARB STEADY)  237 mL Oral BID BM  . insulin aspart  0-5 Units Subcutaneous QHS  . insulin aspart  0-9 Units Subcutaneous TID WC  . insulin detemir  35 Units Subcutaneous QHS  . lanthanum  1,000 mg Oral TID WC  . linagliptin  5 mg Oral Daily  . multivitamin  1 tablet Oral QHS  . mupirocin cream   Topical BID  . pantoprazole  40 mg Oral Daily  . sodium chloride  3 mL Intravenous Q12H     sodium chloride, acetaminophen **OR** acetaminophen, alum & mag hydroxide-simeth, HYDROmorphone (DILAUDID) injection, lanthanum, ondansetron **OR** ondansetron (ZOFRAN) IV, oxyCODONE, promethazine, sodium chloride

## 2014-04-24 NOTE — Progress Notes (Signed)
*  PRELIMINARY RESULTS* Vascular Ultrasound Left lower extremity venous duplex has been completed.  Preliminary findings: negative for DVT and superficial thrombosis.\   ABI completed: ABIs most likely falsely elevated due to calcified vessels. Based on waveforms and Great toe pressures, the right side demonstrates mild arterial disease, while the left demonstrates severe arterial disease.   RIGHT    LEFT    PRESSURE WAVEFORM  PRESSURE WAVEFORM  BRACHIAL NA- HD access  BRACHIAL 129 Bi  DP   DP 76 Damp mono  AT 300 Bi AT    PT Not audible  PT 81 Damp mono  PER Not audible  PER    GREAT TOE 120 NA GREAT TOE <20 NA    RIGHT LEFT  ABI Non- compressible 0.63- faslely elevated      Farrel DemarkJill Eunice, RDMS, RVT  04/24/2014, 1:52 PM

## 2014-04-24 NOTE — Progress Notes (Signed)
[facility]    Subjective:    Antibiotics:  Anti-infectives    Start     Dose/Rate Route Frequency Ordered Stop   04/24/14 1200  vancomycin (VANCOCIN) IVPB 1000 mg/200 mL premix  Status:  Discontinued     1,000 mg200 mL/hr over 60 Minutes Intravenous Every T-Th-Sa (Hemodialysis) 04/21/14 2200 04/22/14 0855   04/22/14 2200  cefTRIAXone (ROCEPHIN) 1 g in dextrose 5 % 50 mL IVPB  Status:  Discontinued     1 g100 mL/hr over 30 Minutes Intravenous Every 24 hours 04/21/14 2211 04/22/14 0855   04/22/14 0645  vancomycin (VANCOCIN) 1,750 mg in sodium chloride 0.9 % 500 mL IVPB  Status:  Discontinued     1,750 mg250 mL/hr over 120 Minutes Intravenous  Once 04/22/14 0634 04/22/14 0855   04/21/14 2200  cefTRIAXone (ROCEPHIN) 2 g in dextrose 5 % 50 mL IVPB  Status:  Discontinued     2 g100 mL/hr over 30 Minutes Intravenous Every 24 hours 04/21/14 2157 04/22/14 0855      Medications: Scheduled Meds: . clonazePAM  0.5 mg Oral QHS  . feeding supplement (NEPRO CARB STEADY)  237 mL Oral BID BM  . insulin aspart  0-5 Units Subcutaneous QHS  . insulin aspart  0-9 Units Subcutaneous TID WC  . insulin detemir  35 Units Subcutaneous QHS  . lanthanum  1,000 mg Oral TID WC  . linagliptin  5 mg Oral Daily  . multivitamin  1 tablet Oral QHS  . mupirocin cream   Topical BID  . pantoprazole  40 mg Oral Daily  . sodium chloride  3 mL Intravenous Q12H   Continuous Infusions:  PRN Meds:.sodium chloride, acetaminophen **OR** acetaminophen, alum & mag hydroxide-simeth, HYDROmorphone (DILAUDID) injection, lanthanum, ondansetron **OR** ondansetron (ZOFRAN) IV, oxyCODONE, promethazine, sodium chloride    Objective: Weight change:   Intake/Output Summary (Last 24 hours) at 04/24/14 1751 Last data filed at 04/24/14 1221  Gross per 24 hour  Intake    240 ml  Output    780 ml  Net   -540 ml   Blood pressure 145/62, pulse 68, temperature 97.7 F (36.5 C), temperature source  Oral, resp. rate 18, height  (1.549 m), weight 189 lb 9.5 oz (86 kg), SpO2 100 %. Temp:  [97.6 F (36.4 C)-98.2 F (36.8 C)] 97.7 F (36.5 C) (01/12 1430) Pulse Rate:  [65-76] 68 (01/12 1430) Resp:  [16-18] 18 (01/12 1430) BP: (102-151)/(57-66) 145/62 mmHg (01/12 1430) SpO2:  [98 %-100 %] 100 % (01/12 1430) Weight:  [189 lb 9.5 oz (86 kg)-190 lb 14.7 oz (86.6 kg)] 189 lb 9.5 oz (86 kg) (01/12 1012)  Physical Exam: General: Alert and awake, oriented x3, not in any acute distress. Having echocardiogram performed CV: rrr Pulm: no wheezing  Neuro: able to move lower extremities vs gravity and resistance but 4/5 and  Extremities:  She had ointment applied today  See pictures below dated 04/23/2014:   Left foot: hyperpigmented area over 5th digit, LE are cool and pulses in DP are weak        Right foot:       CBC:  CBC Latest Ref Rng 04/24/2014 04/23/2014 04/22/2014  WBC 4.0 - 10.5 K/uL 9.3 10.2 10.1  Hemoglobin 12.0 - 15.0 g/dL 10.5(L) 10.4(L) 10.3(L)  Hematocrit 36.0 - 46.0 % 33.9(L) 33.0(L) 33.2(L)  Platelets 150 - 400 K/uL 269 294 342     BMET  Recent Labs  04/23/14 0432 04/24/14 0500  NA  133* 130*  K 4.1 4.8  CL 92* 91*  CO2 25 24  GLUCOSE 48* 312*  BUN 45* 31*  CREATININE 8.29* 7.12*  CALCIUM 9.7 8.8     Liver Panel   Recent Labs  04/23/14 0432 04/24/14 0500  PROT  --  6.5  ALBUMIN 2.9* 2.9*  AST  --  60*  ALT  --  9  ALKPHOS  --  118*  BILITOT  --  1.0       Sedimentation Rate  Recent Labs  04/23/14 1130  ESRSEDRATE 53*   C-Reactive Protein  Recent Labs  04/23/14 1130  CRP 8.4*    Micro Results: Recent Results (from the past 720 hour(s))  Blood culture (routine x 2)     Status: None (Preliminary result)   Collection Time: 04/21/14  8:36 PM  Result Value Ref Range Status   Specimen Description BLOOD LEFT HAND  Final   Special Requests BOTTLES DRAWN AEROBIC AND ANAEROBIC 3CC  Final   Culture   Final            BLOOD CULTURE RECEIVED NO GROWTH TO DATE CULTURE WILL BE HELD FOR 5 DAYS BEFORE ISSUING A FINAL NEGATIVE REPORT Performed at [facility]    Report Status PENDING  Incomplete  Blood culture (routine x 2)     Status: None (Preliminary result)   Collection Time: 04/21/14  9:37 PM  Result Value Ref Range Status   Specimen Description BLOOD LEFT ARM  Final   Special Requests BOTTLES DRAWN AEROBIC ONLY 3CC  Final   Culture   Final           BLOOD CULTURE RECEIVED NO GROWTH TO DATE CULTURE WILL BE HELD FOR 5 DAYS BEFORE ISSUING A FINAL NEGATIVE REPORT Performed at [facility]    Report Status PENDING  Incomplete    Studies/Results: Mri Left Foot Without Contrast  04/24/2014   CLINICAL DATA:  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 Injury at base of fifth toe. Evaluate for osteomyelitis. Initial encounter.  EXAM: MRI OF THE LEFT FOREFOOT WITHOUT CONTRAST  TECHNIQUE: Multiplanar, multisequence MR imaging was performed. No intravenous contrast was administered.  COMPARISON:  Radiographs 04/21/2014.  FINDINGS: Examination is mildly motion degraded. There is no evidence of acute fracture, dislocation, bone destruction or marrow edema. The toes and metatarsal phalangeal joints appear normal aside from minimal degenerative changes at the first MTP joint. The alignment is normal at the Lisfranc joint.  There is generalized edema throughout the subcutaneous fat and muscles of the forefoot without focal fluid collection. There is a small amount of fluid surrounding the flexor tendons. No skin ulceration identified.  IMPRESSION: 1. No evidence of osteomyelitis within the left forefoot. 2. 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   Electronically Signed   By: Roxy HorsemanBill  Veazey M.D.   On: 04/24/2014 13:32   Dg Chest Port 1v Same Day  04/23/2014   CLINICAL DATA:  Shortness of breath. End-stage renal  disease. Status post dialysis today.  EXAM: PORTABLE CHEST - 1 VIEW SAME DAY  COMPARISON:  12/22/2013  FINDINGS: Heart size is normal. Both lungs are clear. No evidence of pleural effusion. Left jugular dual-lumen central venous dialysis catheter remains in place.  IMPRESSION: No acute findings.   Electronically Signed   By: Myles RosenthalJohn  Stahl M.D.   On: 04/23/2014 21:50      Assessment/Plan:  Principal Problem:   Diskitis  Active Problems:   Hyponatremia   Diabetes mellitus   HTN (hypertension)   ESRD on dialysis   Anemia of renal disease   Elevated troponin-(0.27)   Bradycardia-transient, resolved   Diabetic foot ulcer    [name] is a 54 y.o. 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 She had a prior infected right chest hemodialysis catheter in September. I called her dialysis center on 1 over [facility] and indeed on 12/14/2013 she had grown coagulase-negative staphylococcal species out of 2 different blood culture sites. She had been started on vancomycin and had cleared her blood cultures she had the catheter removed and there is a catheter tip culture from inpatient world here that is negative. She completed at least a six-week course of antibiotics. She had actually also again been on antibiotics in the form of vancomycin for left toe infection. Her toe has not shown evidence of osteomyelitis on plain films though more aggressive imaging has not yet been performed.  #1 Discitis:  IR to get guided aspirate tomorrow for bacterial cultures   I will plan on starting vancomycin and ceftazidime after the procedure and following up culture data  Certainly the prime suspect for infection here would be the coagulase-negative staph that she had in her bloodstream previously although she could've seated again from a different site with a different organism with one potential portal being her foot.  #2 Diabetic foot ulcer:  I have engaged diabetic foot focused order set including vascular studies  Which show "the right side demonstrates mild arterial disease, while the left demonstrates severe arterial disease."  MRI foot does not show osteomyeltiis  #3 HIV negative, hep C pending     LOS: 3 days   Acey Lav 04/24/2014, 5:51 PM

## 2014-04-24 NOTE — Progress Notes (Signed)
Patient ID: [name], female   DOB: 09/12/1960, 54 y.o.   MRN: [id number]   IR disc aspiration scheduled for 1/12 has been rescheduled to 1/13 Pt in dialysis this am Dr Corliss Skainseveshwar does not want pt with Heparin on board  Rescheduled to 1/13 am See diet orders to resume now then npo after MN tonight

## 2014-04-24 NOTE — Clinical Documentation Improvement (Deleted)
Please clarify if patient has a [employer] pacemaker or valve or both as conflicting documentation noted in the chart.    Thank you, Doy MinceVangela Ayisha Pol, RN [phone] Clinical Documentation Specialist

## 2014-04-24 NOTE — Progress Notes (Signed)
    Subjective:  Pt in vascular lab  Objective:  Vital Signs in the last 24 hours: Temp:  [97.6 F (36.4 C)-98.5 F (36.9 C)] 97.6 F (36.4 C) (01/12 1012) Pulse Rate:  [63-76] 66 (01/12 1012) Resp:  [16-18] 18 (01/12 1012) BP: (102-151)/(57-76) 135/63 mmHg (01/12 1012) SpO2:  [98 %-100 %] 98 % (01/12 1012) Weight:  [189 lb 9.5 oz (86 kg)-190 lb 14.7 oz (86.6 kg)] 189 lb 9.5 oz (86 kg) (01/12 1012)  Intake/Output from previous day:  Intake/Output Summary (Last 24 hours) at 04/24/14 1355 Last data filed at 04/24/14 1221  Gross per 24 hour  Intake    240 ml  Output   1379 ml  Net  -1139 ml    Physical Exam: Pt is off the floor   Rate: 66  Rhythm: normal sinus rhythm by EKG 04/23/14  Lab Results:  Recent Labs  04/23/14 0432 04/24/14 0500  WBC 10.2 9.3  HGB 10.4* 10.5*  PLT 294 269    Recent Labs  04/23/14 0432 04/24/14 0500  NA 133* 130*  K 4.1 4.8  CL 92* 91*  CO2 25 24  GLUCOSE 48* 312*  BUN 45* 31*  CREATININE 8.29* 7.12*    Recent Labs  04/22/14 1215 04/23/14 0432  TROPONINI 0.08* 0.27*    Recent Labs  04/23/14 1230  INR 1.44    Imaging: Imaging results have been reviewed  Cardiac Studies: Echo 04/23/14 Study Conclusions  - Left ventricle: The cavity size was normal. Wall thickness was increased in a pattern of mild LVH. Systolic function was normal. The estimated ejection fraction was in the range of 55% to 60%. - Aortic valve: There was mild stenosis. Valve area (VTI): 1.23 cm^2. Valve area (Vmax): 0.98 cm^2. Valve area (Vmean): 0.98 cm^2. - Left atrium: The atrium was mildly dilated. - Atrial septum: No defect or patent foramen ovale was identified. - Pulmonary arteries: PA peak pressure: 82 mm Hg (S).   Assessment/Plan:  54 yo no history of CAD but a history of DM ESRD, HTN, and T spine diskitis. We were asked to see her in consult for an elevated Troponin (0.27) and transient asymptomatic bradycardia. TTE shows mild  AS with normal LVF, no evidence of SBE.    Principal Problem:   Diskitis Active Problems:   Diabetes mellitus   HTN (hypertension)   ESRD on dialysis   Diabetic foot ulcer   Hyponatremia   Anemia of renal disease   Elevated troponin-(0.27)   Bradycardia-transient, resolved   PLAN:  MD to see.   Corine ShelterLuke Kilroy PA-C Beeper [phone] 04/24/2014, 1:55 PM   No further workup for elevated troponin. EF normal. Pulmonary HTN noted  Please see prior consult note from Dr. Eden EmmsNishan Review of records performed.  Patient currently getting battery of tests.   Donato SchultzSKAINS, Antonino Nienhuis, MD

## 2014-04-24 NOTE — Progress Notes (Signed)
PROGRESS NOTE  [name] MVH:[id number] DOB: February 17, 1961 DOA: 04/21/2014 PCP: Pearla Dubonnet, MD  HPI: [name] is a 54 y.o. female with a history of ESRD on HD, HTN, IDDM, Lumbar DDD who presents to the ED with complaints of progressive weakness over the past month in both legs and her knee buckling and precipitating a fall today. She was not able to get up and was brought to the ED. She also had an injury to her Left 5th Toe 1 month ago and she saw her PCP, and over the month she developed increased redness so her dialysis doctor placed over on IV Vancomycin to treat a cellulitis with her dialysis Rx 4 days ago. In the ED tonight, An X-Ray of the Left Foot was performed and was negative for fracture and findings of Osteomyelitis, and an MRI of the Lumbar Spine which revealed +diskitis/Osteomyelitis of the T10-T11 disk. NeuroSurgery Dr. Tressie Stalker was consulted and patient was placed on IV Vancomycin and Rocephin and referred for medical admission  Subjective / 24 H Interval events No CP. Patient seen in vascular lab today.  Assessment/Plan: Principal Problem:   Diskitis Active Problems:   Hyponatremia   Diabetes mellitus   HTN (hypertension)   ESRD on dialysis   Anemia of renal disease   Elevated troponin-(0.27)   Bradycardia-transient, resolved   Diabetic foot ulcer  Diskitis- on MRI - neurosurgery consulted - consulted ID 1/10, will hold antibiotics for now, patient not septic, to improve yield of cultures. -IR delayed aspiration until 1/13.  Cellulitis and abscess of toe - stable, hold antibiotics for now per #1, closely monitor, start if febrile/site gets worse   Elevated troponin - in the setting of infection, ESRD  - no chest pain or discomfort - Cardiology consulted. -No further work up is planned.  ESRD on dialysis - Renal on board. -Received HD 1/12.  Mobitz type I heart block - Discontinued her atenolol, will obtain a 2-D  echo, continue to closely monitor. She doesn't have any symptoms.   Diabetes mellitus - Levemir decreasedfrom 41 to 35 on 1/11 due to hypoglycemia. - Hypoglycemia resolved with decreased levemir dose. -May need to increase again if CBGs remain elevated in am.  Malignant hypertension - discontinue Atenolol  - monitor BPs, now controlled  Hyponatremia - Improved with with Dialysis  Anemia of Renal Disease -Continue Reno-vite - Monitor H/Hs  Leukocytosis - due to #1 and #2,   Diet: Diet renal/carb modified with 1200 ml fluid restriction Fluids: none DVT Prophylaxis: SCD  Code Status: Full Code Family Communication: none  Disposition Plan: remain inpatient  Consultants:  ID  Nephrology  Cardiology  Procedures:  None    Antibiotics Vancomycin 1/5 >> 1/10 (at prior 2 HD sessions last week) Ceftriaxone 1/9>>1/10   Studies  Mri Left Foot Without Contrast  04/24/2014   CLINICAL DATA:  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 Injury at base of fifth toe. Evaluate for osteomyelitis. Initial encounter.  EXAM: MRI OF THE LEFT FOREFOOT WITHOUT CONTRAST  TECHNIQUE: Multiplanar, multisequence MR imaging was performed. No intravenous contrast was administered.  COMPARISON:  Radiographs 04/21/2014.  FINDINGS: Examination is mildly motion degraded. There is no evidence of acute fracture, dislocation, bone destruction or marrow edema. The toes and metatarsal phalangeal joints appear normal aside from minimal degenerative changes at the first MTP joint. The alignment is normal at the Lisfranc joint.  There is generalized edema throughout the subcutaneous fat and muscles of the forefoot without focal  fluid collection. There is a small amount of fluid surrounding the flexor tendons. No skin ulceration identified.  IMPRESSION: 1. No evidence of osteomyelitis within the left forefoot. 2. Nonspecific subcutaneous and muscular edema within the foot,  probably due to a combination of cellulitis and diabetic myopathy.   Electronically Signed   By: Roxy HorsemanBill  Veazey M.D.   On: 04/24/2014 13:32   Dg Chest Port 1v Same Day  04/23/2014   CLINICAL DATA:  Shortness of breath. End-stage renal disease. Status post dialysis today.  EXAM: PORTABLE CHEST - 1 VIEW SAME DAY  COMPARISON:  12/22/2013  FINDINGS: Heart size is normal. Both lungs are clear. No evidence of pleural effusion. Left jugular dual-lumen central venous dialysis catheter remains in place.  IMPRESSION: No acute findings.   Electronically Signed   By: Myles RosenthalJohn  Stahl M.D.   On: 04/23/2014 21:50    Objective  Filed Vitals:   04/24/14 0900 04/24/14 0930 04/24/14 1000 04/24/14 1012  BP: 120/58 107/60 108/58 135/63  Pulse: 65 67 67 66  Temp:    97.6 F (36.4 C)  TempSrc:    Oral  Resp:    18  Height:      Weight:    86 kg (189 lb 9.5 oz)  SpO2:    98%    Intake/Output Summary (Last 24 hours) at 04/24/14 1442 Last data filed at 04/24/14 1221  Gross per 24 hour  Intake    240 ml  Output   1379 ml  Net  -1139 ml   Filed Weights   04/23/14 1618 04/24/14 0611 04/24/14 1012  Weight: 86.6 kg (190 lb 14.7 oz) 86.6 kg (190 lb 14.7 oz) 86 kg (189 lb 9.5 oz)    Exam:  General:  NAD  HEENT: no scleral icterus  Cardiovascular: RRR, 3/6 SEM  Respiratory: CTA biL  Abdomen: soft, non tender  Skin: no rashes, left 5th toe with cellulitic changes  Neuro: non focal   Data Reviewed: Basic Metabolic Panel:  Recent Labs Lab 04/21/14 1455 04/22/14 0505 04/23/14 0432 04/24/14 0500  NA 128* 130* 133* 130*  K 4.6 5.0 4.1 4.8  CL 89* 90* 92* 91*  CO2 21 21 25 24   GLUCOSE 460* 361* 48* 312*  BUN 33* 40* 45* 31*  CREATININE 6.44* 7.23* 8.29* 7.12*  CALCIUM 10.0 9.8 9.7 8.8  PHOS  --   --  7.1*  --    Liver Function Tests:  Recent Labs Lab 04/21/14 1455 04/23/14 0432 04/24/14 0500  AST 28  --  60*  ALT 6  --  9  ALKPHOS 106  --  118*  BILITOT 1.4*  --  1.0  PROT 6.9  --   6.5  ALBUMIN 3.1* 2.9* 2.9*   CBC:  Recent Labs Lab 04/21/14 1455 04/22/14 0505 04/23/14 0432 04/24/14 0500  WBC 10.7* 10.1 10.2 9.3  NEUTROABS 9.5*  --   --   --   HGB 10.6* 10.3* 10.4* 10.5*  HCT 33.1* 33.2* 33.0* 33.9*  MCV 86.0 86.5 86.8 89.0  PLT 315 342 294 269   BNP (last 3 results)  Recent Labs  07/10/13 1330  PROBNP 15005.0*   CBG:  Recent Labs Lab 04/23/14 1322 04/23/14 1723 04/23/14 2203 04/24/14 0520 04/24/14 1127  GLUCAP 97 77 126* 251* 136*   Scheduled Meds: . clonazePAM  0.5 mg Oral QHS  . feeding supplement (NEPRO CARB STEADY)  237 mL Oral BID BM  . insulin aspart  0-5 Units Subcutaneous QHS  .  insulin aspart  0-9 Units Subcutaneous TID WC  . insulin detemir  35 Units Subcutaneous QHS  . lanthanum  1,000 mg Oral TID WC  . linagliptin  5 mg Oral Daily  . multivitamin  1 tablet Oral QHS  . mupirocin cream   Topical BID  . pantoprazole  40 mg Oral Daily  . sodium chloride  3 mL Intravenous Q12H   Continuous Infusions:   Peggye Pitt, MD [facility] Pager [phone] If 7 PM - 7 AM, please contact night-coverage at [email or web address], password [facility]  04/24/2014, 2:42 PM  LOS: 3 days

## 2014-04-24 NOTE — Progress Notes (Signed)
Inpatient Diabetes Program Recommendations  AACE/ADA: New Consensus Statement on Inpatient Glycemic Control (2013)  Target Ranges:  Prepandial:   less than 140 mg/dL      Peak postprandial:   less than 180 mg/dL (1-2 hours)      Critically ill patients:  140 - 180 mg/dL     Results for Orson EvaSHOFFNER, Ayen J (MRN [id number]) as of 04/24/2014 09:38  Ref. Range 04/23/2014 06:09 04/23/2014 06:28 04/23/2014 06:50 04/23/2014 07:08 04/23/2014 11:10 04/23/2014 12:40 04/23/2014 13:22 04/23/2014 17:23 04/23/2014 22:03  Glucose-Capillary Latest Range: 70-99 mg/dL 53 (L) 49 (L) 64 (L) 95 70 85 97 77 126 (H)    Results for Orson EvaSHOFFNER, Arma J (MRN [id number]) as of 04/24/2014 09:38  Ref. Range 04/24/2014 05:20  Glucose-Capillary Latest Range: 70-99 mg/dL 086251 (H)     Current Orders: Levemir 35 units QHS (hold if CBG <100 mg/dl)     Novolog Sensitive SSI tid ac + HS     Tradjenta 5 mg daily   **Hypoglycemic yesterday AM.  Levemir reduced to 35 units QHS as a result.  **Patient refused Levemir last PM.  As a result, AM CBG today 251 mg/dl.  **Expect patient to have some elevated CBGs today since she refused Lantus last PM.    Will follow Ambrose FinlandJeannine Johnston Amyriah Buras RN, MSN, CDE Diabetes Coordinator Inpatient Diabetes Program Team Pager: [phone] (8a-10p)

## 2014-04-24 NOTE — Progress Notes (Signed)
Patient ID: [name], female   DOB: 08/09/1960, 54 y.o.   MRN: [id number] Subjective:  The patient is alert and pleasant. She is in no apparent distress.  Objective: Vital signs in last 24 hours: Temp:  [97.7 F (36.5 C)-98.5 F (36.9 C)] 98 F (36.7 C) (01/12 0611) Pulse Rate:  [63-76] 73 (01/12 0730) Resp:  [16-18] 18 (01/12 0611) BP: (102-151)/(57-76) 102/57 mmHg (01/12 0730) SpO2:  [99 %-100 %] 100 % (01/12 0611) Weight:  [86.6 kg (190 lb 14.7 oz)-87.7 kg (193 lb 5.5 oz)] 86.6 kg (190 lb 14.7 oz) (01/12 40980611)  Intake/Output from previous day: 01/11 0701 - 01/12 0700 In: 483 [P.O.:480; I.V.:3] Out: 599  Intake/Output this shift:    Physical exam the patient is alert and oriented 3. Her strength is grossly normal and her bilateral gastrocnemius and dorsiflexors.  Lab Results:  Recent Labs  04/23/14 0432 04/24/14 0500  WBC 10.2 9.3  HGB 10.4* 10.5*  HCT 33.0* 33.9*  PLT 294 269   BMET  Recent Labs  04/23/14 0432 04/24/14 0500  NA 133* 130*  K 4.1 4.8  CL 92* 91*  CO2 25 24  GLUCOSE 48* 312*  BUN 45* 31*  CREATININE 8.29* 7.12*  CALCIUM 9.7 8.8    Studies/Results: Dg Chest Port 1v Same Day  04/23/2014   CLINICAL DATA:  Shortness of breath. End-stage renal disease. Status post dialysis today.  EXAM: PORTABLE CHEST - 1 VIEW SAME DAY  COMPARISON:  12/22/2013  FINDINGS: Heart size is normal. Both lungs are clear. No evidence of pleural effusion. Left jugular dual-lumen central venous dialysis catheter remains in place.  IMPRESSION: No acute findings.   Electronically Signed   By: Myles RosenthalJohn  Stahl M.D.   On: 04/23/2014 21:50    Assessment/Plan: Presumed T10-11 discitis: The patient's antibiotics are on hold. I am told a needle aspiration is planned for later today. I would not delay her antibiotic treatment indefinitely as I don't have a whole lot of optimism as far as the needle biopsy growing anything. Often under ideal circumstances needle aspirations are  negative. This patient has been treated with courses of antibiotics and has end-stage renal disease making it even less likely that the aspiration will be diagnostic. I.e. we need to start antibiotics as soon as possible,, before she gets weak.  LOS: 3 days     [name] D 04/24/2014, 8:01 AM

## 2014-04-24 NOTE — Procedures (Signed)
I was present at this dialysis session, have reviewed the session itself and made  appropriate changes  Vinson Moselleob Jermond Burkemper MD (pgr) [phone]    [phone] 04/24/2014, 8:59 AM

## 2014-04-24 NOTE — Progress Notes (Signed)
Patient refuses to wear yellow footies.

## 2014-04-24 NOTE — Consult Note (Signed)
Chart review: 5/13 - nausea, CKD, gastritis > NSAiD's stopped 3/30 - 07/26/13 > SOB to ED, severe HTN in ED > known hx of CKD but unable to afford medications or renal f/u. Malig HTN, improved with medications.  Required iniatiation of hemodialysis, AVF was placed and TDC.  Poss PNA rx with abx. DM needs lantus.   12/22/13 - had recent removal of R IJ cath for infection and needed new TDC > L IJ TDC placed by [facility] 01/15/14 - fistulogram of R BC AVF due to difficulty accessing > study showed no sig central stenosis, no competing branches and widely patent BC AVF on right 02/16/14 - difficulty cannulating R BC AVF, felt to possibly be too deep > underwent superficialization of AVF with ligation of 3 competing branches   Vinson Moselleob Sivan Cuello MD pager [phone]    cell [phone] 04/24/2014, 9:18 AM

## 2014-04-25 ENCOUNTER — Inpatient Hospital Stay (HOSPITAL_COMMUNITY): Payer: Medicaid Other

## 2014-04-25 DIAGNOSIS — I96 Gangrene, not elsewhere classified: Secondary | ICD-10-CM | POA: Diagnosis present

## 2014-04-25 DIAGNOSIS — I998 Other disorder of circulatory system: Secondary | ICD-10-CM

## 2014-04-25 DIAGNOSIS — E11628 Type 2 diabetes mellitus with other skin complications: Secondary | ICD-10-CM | POA: Diagnosis present

## 2014-04-25 DIAGNOSIS — L089 Local infection of the skin and subcutaneous tissue, unspecified: Secondary | ICD-10-CM

## 2014-04-25 LAB — GLUCOSE, CAPILLARY
Glucose-Capillary: 205 mg/dL — ABNORMAL HIGH (ref 70–99)
Glucose-Capillary: 97 mg/dL (ref 70–99)
Glucose-Capillary: 101 mg/dL — ABNORMAL HIGH (ref 70–99)
Glucose-Capillary: 162 mg/dL — ABNORMAL HIGH (ref 70–99)

## 2014-04-25 MED ORDER — FENTANYL CITRATE 0.05 MG/ML IJ SOLN
INTRAMUSCULAR | Status: AC | PRN
Start: 2014-04-25 — End: 2014-04-25
  Administered 2014-04-25 (×2): 25 ug via INTRAVENOUS

## 2014-04-25 MED ORDER — FENTANYL CITRATE 0.05 MG/ML IJ SOLN
INTRAMUSCULAR | Status: AC
  Filled 2014-04-25: qty 4

## 2014-04-25 MED ORDER — SODIUM CHLORIDE 0.9 % IV SOLN
2000.0000 mg | Freq: Once | INTRAVENOUS | Status: AC
  Administered 2014-04-25: 2000 mg via INTRAVENOUS
  Filled 2014-04-25: qty 2000

## 2014-04-25 MED ORDER — DEXTROSE 5 % IV SOLN
2.0000 g | Freq: Once | INTRAVENOUS | Status: AC
  Administered 2014-04-25: 2 g via INTRAVENOUS
  Filled 2014-04-25: qty 2

## 2014-04-25 MED ORDER — HYDROMORPHONE HCL 1 MG/ML IJ SOLN
INTRAMUSCULAR | Status: AC
  Filled 2014-04-25: qty 1

## 2014-04-25 MED ORDER — METRONIDAZOLE 500 MG PO TABS
500.0000 mg | ORAL_TABLET | Freq: Three times a day (TID) | ORAL | Status: DC
  Administered 2014-04-25 – 2014-05-02 (×19): 500 mg via ORAL
  Filled 2014-04-25 (×25): qty 1

## 2014-04-25 MED ORDER — BUPIVACAINE HCL (PF) 0.25 % IJ SOLN
INTRAMUSCULAR | Status: AC
  Filled 2014-04-25: qty 30

## 2014-04-25 MED ORDER — MIDAZOLAM HCL 2 MG/2ML IJ SOLN
INTRAMUSCULAR | Status: AC | PRN
  Administered 2014-04-25: 1 mg via INTRAVENOUS

## 2014-04-25 MED ORDER — MIDAZOLAM HCL 2 MG/2ML IJ SOLN
INTRAMUSCULAR | Status: AC
  Filled 2014-04-25: qty 4

## 2014-04-25 MED ORDER — SODIUM CHLORIDE 0.9 % IJ SOLN
INTRAMUSCULAR | Status: AC
  Filled 2014-04-25: qty 10

## 2014-04-25 MED ORDER — VANCOMYCIN HCL IN DEXTROSE 1-5 GM/200ML-% IV SOLN
1000.0000 mg | INTRAVENOUS | Status: DC
  Administered 2014-04-26 – 2014-04-28 (×2): 1000 mg via INTRAVENOUS
  Filled 2014-04-25 (×6): qty 200

## 2014-04-25 MED ORDER — DEXTROSE 5 % IV SOLN
2.0000 g | INTRAVENOUS | Status: DC
  Administered 2014-04-26 – 2014-04-28 (×2): 2 g via INTRAVENOUS
  Filled 2014-04-25 (×4): qty 2

## 2014-04-25 NOTE — Progress Notes (Signed)
Patient ID: [name], female   DOB: 04/16/1960, 54 y.o.   MRN: [id number] Subjective:  The patient is alert and pleasant and in no apparent distress. I am told she is going to get her needle aspiration today.  Objective: Vital signs in last 24 hours: Temp:  [97.6 F (36.4 C)-98.4 F (36.9 C)] 98.4 F (36.9 C) (01/13 0500) Pulse Rate:  [65-72] 68 (01/13 0500) Resp:  [16-18] 16 (01/13 0500) BP: (101-145)/(45-63) 132/58 mmHg (01/13 0500) SpO2:  [93 %-100 %] 94 % (01/13 0500) Weight:  [86 kg (189 lb 9.5 oz)] 86 kg (189 lb 9.5 oz) (01/12 1012)  Intake/Output from previous day: 01/12 0701 - 01/13 0700 In: 963 [P.O.:960; I.V.:3] Out: 780  Intake/Output this shift:    Physical exam the patient is alert and oriented 3. Her strength is grossly normal anabolic gastrocnemius and dorsiflexors except for some occasional giveaway weakness.  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 Injury at base of fifth toe. Evaluate for osteomyelitis. Initial encounter.  EXAM: MRI OF THE LEFT FOREFOOT WITHOUT CONTRAST  TECHNIQUE: Multiplanar, multisequence MR imaging was performed. No intravenous contrast was administered.  COMPARISON:  Radiographs 04/21/2014.  FINDINGS: Examination is mildly motion degraded. There is no evidence of acute fracture, dislocation, bone destruction or marrow edema. The toes and metatarsal phalangeal joints appear normal aside from minimal degenerative changes at the first MTP joint. The alignment is normal at the Lisfranc joint.  There is generalized edema throughout the  subcutaneous fat and muscles of the forefoot without focal fluid collection. There is a small amount of fluid surrounding the flexor tendons. No skin ulceration identified.  IMPRESSION: 1. No evidence of osteomyelitis within the left forefoot. 2. 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   Electronically Signed   By: Roxy HorsemanBill  Veazey M.D.   On: 04/24/2014 13:32   Dg Chest Port 1v Same Day  04/23/2014   CLINICAL DATA:  Shortness of breath. End-stage renal disease. Status post dialysis today.  EXAM: PORTABLE CHEST - 1 VIEW SAME DAY  COMPARISON:  12/22/2013  FINDINGS: Heart size is normal. Both lungs are clear. No evidence of pleural effusion. Left jugular dual-lumen central venous dialysis catheter remains in place.  IMPRESSION: No acute findings.   Electronically Signed   By: Myles RosenthalJohn  Stahl M.D.   On: 04/23/2014 21:50    Assessment/Plan: T10-11 discitis: Another day has gone by without her being continued on antibiotics. She needs to start empiric antibiotics as soon as possible as it would be preferable to treat this with antibiotics as opposed to surgery.  LOS: 4 days     Farrin Shadle D 04/25/2014, 8:53 AM

## 2014-04-25 NOTE — Procedures (Signed)
S/P T 10 - T 11 fluoro guided disc aspiration

## 2014-04-25 NOTE — Progress Notes (Signed)
[facility]    Subjective: left toe pain has become excruciating    Antibiotics:  Anti-infectives    Start     Dose/Rate Route Frequency Ordered Stop   04/26/14 1800  cefTAZidime (FORTAZ) 2 g in dextrose 5 % 50 mL IVPB     2 g100 mL/hr over 30 Minutes Intravenous Every T-Th-S-Su (1800) 04/25/14 1618     04/26/14 1200  vancomycin (VANCOCIN) IVPB 1000 mg/200 mL premix     1,000 mg200 mL/hr over 60 Minutes Intravenous Every T-Th-Sa (Hemodialysis) 04/25/14 1618     04/25/14 1700  vancomycin (VANCOCIN) 2,000 mg in sodium chloride 0.9 % 500 mL IVPB     2,000 mg250 mL/hr over 120 Minutes Intravenous  Once 04/25/14 1618     04/25/14 1700  cefTAZidime (FORTAZ) 2 g in dextrose 5 % 50 mL IVPB     2 g100 mL/hr over 30 Minutes Intravenous  Once 04/25/14 1618 04/25/14 1701   04/24/14 1200  vancomycin (VANCOCIN) IVPB 1000 mg/200 mL premix  Status:  Discontinued     1,000 mg200 mL/hr over 60 Minutes Intravenous Every T-Th-Sa (Hemodialysis) 04/21/14 2200 04/22/14 0855   04/22/14 2200  cefTRIAXone (ROCEPHIN) 1 g in dextrose 5 % 50 mL IVPB  Status:  Discontinued     1 g100 mL/hr over 30 Minutes Intravenous Every 24 hours 04/21/14 2211 04/22/14 0855   04/22/14 0645  vancomycin (VANCOCIN) 1,750 mg in sodium chloride 0.9 % 500 mL IVPB  Status:  Discontinued     1,750 mg250 mL/hr over 120 Minutes Intravenous  Once 04/22/14 0634 04/22/14 0855   04/21/14 2200  cefTRIAXone (ROCEPHIN) 2 g in dextrose 5 % 50 mL IVPB  Status:  Discontinued     2 g100 mL/hr over 30 Minutes Intravenous Every 24 hours 04/21/14 2157 04/22/14 0855      Medications: Scheduled Meds: . bupivacaine (PF)      . [START ON 04/26/2014] cefTAZidime (FORTAZ)  IV  2 g Intravenous Q T,Th,S,Su-1800  . clonazePAM  0.5 mg Oral QHS  . feeding supplement (NEPRO CARB STEADY)  237 mL Oral BID BM  . fentaNYL      . insulin aspart  0-5 Units Subcutaneous QHS  . insulin aspart  0-9 Units Subcutaneous TID WC  . insulin  detemir  35 Units Subcutaneous QHS  . lanthanum  1,000 mg Oral TID WC  . linagliptin  5 mg Oral Daily  . midazolam      . multivitamin  1 tablet Oral QHS  . mupirocin cream   Topical BID  . pantoprazole  40 mg Oral Daily  . sodium chloride  3 mL Intravenous Q12H  . sodium chloride      . vancomycin  2,000 mg Intravenous Once  . [START ON 04/26/2014] vancomycin  1,000 mg Intravenous Q T,Th,Sa-HD   Continuous Infusions:  PRN Meds:.sodium chloride, acetaminophen **OR** acetaminophen, alum & mag hydroxide-simeth, HYDROmorphone (DILAUDID) injection, lanthanum, ondansetron **OR** ondansetron (ZOFRAN) IV, oxyCODONE, promethazine, sodium chloride    Objective: Weight change: -3 lb 12 oz (-1.7 kg)  Intake/Output Summary (Last 24 hours) at 04/25/14 1801 Last data filed at 04/25/14 0800  Gross per 24 hour  Intake    243 ml  Output      0 ml  Net    243 ml   Blood pressure 155/76, pulse 77, temperature 98.4 F (36.9 C), temperature source Oral, resp. rate 15, height  (1.549 m), weight 189 lb 9.5 oz (86 kg),  SpO2 100 %. Temp:  [98.2 F (36.8 C)-98.4 F (36.9 C)] 98.4 F (36.9 C) (01/13 0500) Pulse Rate:  [68-77] 77 (01/13 1326) Resp:  [12-16] 15 (01/13 1326) BP: (101-174)/(45-82) 155/76 mmHg (01/13 1326) SpO2:  [93 %-100 %] 100 % (01/13 1326)  Physical Exam: General: Alert and awake, oriented x3, not in any acute distress. Having echocardiogram performed CV: rrr Pulm: no wheezing  Neuro: able to move lower extremities vs gravity and resistance but 4/5 and  Extremities:  She had ointment applied today  See pictures below:  Left foot: hyperpigmented area over 5th digit, LE are cool and pulses in DP are weak          Left foot today 02/24/15:        04/23/2014:    Right foot 04/23/14       CBC:  CBC Latest Ref Rng 04/24/2014 04/23/2014 04/22/2014  WBC 4.0 - 10.5 K/uL 9.3 10.2 10.1  Hemoglobin 12.0 - 15.0 g/dL 10.5(L) 10.4(L) 10.3(L)  Hematocrit 36.0 -  46.0 % 33.9(L) 33.0(L) 33.2(L)  Platelets 150 - 400 K/uL 269 294 342     BMET  Recent Labs  04/23/14 0432 04/24/14 0500  NA 133* 130*  K 4.1 4.8  CL 92* 91*  CO2 25 24  GLUCOSE 48* 312*  BUN 45* 31*  CREATININE 8.29* 7.12*  CALCIUM 9.7 8.8     Liver Panel   Recent Labs  04/23/14 0432 04/24/14 0500  PROT  --  6.5  ALBUMIN 2.9* 2.9*  AST  --  60*  ALT  --  9  ALKPHOS  --  118*  BILITOT  --  1.0       Sedimentation Rate  Recent Labs  04/23/14 1130  ESRSEDRATE 53*   C-Reactive Protein  Recent Labs  04/23/14 1130  CRP 8.4*    Micro Results: Recent Results (from the past 720 hour(s))  Blood culture (routine x 2)     Status: None (Preliminary result)   Collection Time: 04/21/14  8:36 PM  Result Value Ref Range Status   Specimen Description BLOOD LEFT HAND  Final   Special Requests BOTTLES DRAWN AEROBIC AND ANAEROBIC 3CC  Final   Culture   Final           BLOOD CULTURE RECEIVED NO GROWTH TO DATE CULTURE WILL BE HELD FOR 5 DAYS BEFORE ISSUING A FINAL NEGATIVE REPORT Performed at [facility]    Report Status PENDING  Incomplete  Blood culture (routine x 2)     Status: None (Preliminary result)   Collection Time: 04/21/14  9:37 PM  Result Value Ref Range Status   Specimen Description BLOOD LEFT ARM  Final   Special Requests BOTTLES DRAWN AEROBIC ONLY 3CC  Final   Culture   Final           BLOOD CULTURE RECEIVED NO GROWTH TO DATE CULTURE WILL BE HELD FOR 5 DAYS BEFORE ISSUING A FINAL NEGATIVE REPORT Performed at [facility]    Report Status PENDING  Incomplete    Studies/Results: Mri Left Foot Without Contrast  04/24/2014   CLINICAL DATA:  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 Injury at base of fifth toe. Evaluate for osteomyelitis. Initial encounter.  EXAM: MRI OF THE LEFT FOREFOOT WITHOUT CONTRAST  TECHNIQUE: Multiplanar, multisequence MR imaging was performed. No intravenous contrast was administered.   COMPARISON:  Radiographs 04/21/2014.  FINDINGS: Examination is mildly motion degraded. There is no evidence of acute fracture, dislocation, bone destruction  or marrow edema. The toes and metatarsal phalangeal joints appear normal aside from minimal degenerative changes at the first MTP joint. The alignment is normal at the Lisfranc joint.  There is generalized edema throughout the subcutaneous fat and muscles of the forefoot without focal fluid collection. There is a small amount of fluid surrounding the flexor tendons. No skin ulceration identified.  IMPRESSION: 1. No evidence of osteomyelitis within the left forefoot. 2. 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   Electronically Signed   By: Roxy Horseman M.D.   On: 04/24/2014 13:32   Dg Chest Port 1v Same Day  04/23/2014   CLINICAL DATA:  Shortness of breath. End-stage renal disease. Status post dialysis today.  EXAM: PORTABLE CHEST - 1 VIEW SAME DAY  COMPARISON:  12/22/2013  FINDINGS: Heart size is normal. Both lungs are clear. No evidence of pleural effusion. Left jugular dual-lumen central venous dialysis catheter remains in place.  IMPRESSION: No acute findings.   Electronically Signed   By: Myles Rosenthal M.D.   On: 04/23/2014 21:50      Assessment/Plan:  Principal Problem:   Diskitis Active Problems:   Hyponatremia   Diabetes mellitus   HTN (hypertension)   ESRD on dialysis   Anemia of renal disease   Elevated troponin-(0.27)   Bradycardia-transient, resolved   Diabetic foot ulcer   Discitis of lumbar region    [name] is a 54 y.o. 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 She had a prior infected right chest hemodialysis catheter in September. I called her dialysis center on 1 over [facility] and indeed on 12/14/2013 she had grown methicillin S  coagulase-negative  staphylococcal species out of 2 different blood culture sites. She had been started on vancomycin and had cleared her blood cultures she had the catheter removed and there is a catheter tip culture from inpatient world here that is negative. She completed at least a six-week course of antibiotics. She had actually also again been on antibiotics in the form of vancomycin for left toe infection. Her toe has not shown evidence of osteomyelitis on plain films though more aggressive imaging has not yet been performed. MRI also was without evidence of osteomyelitis. However she has some ischemic changes there and they seem to have worsened with severe pain today after having had hemodialysis  #1 Ischemic changes 4th and 5th digit: I consulted Dr. Hart Rochester who kindly saw the patient quite rapidly after I called him and he is planning on angiography tomorrow to see if there are any vascular interventions that can be undertaken. He believes she will likely need imitation of her fourth and fifth digits and has engaged Dr. Lajoyce Corners. 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  #2 Discitis:  IR to get guided aspirate obtained.  We have started vancomycin and ceftazidime, and will follow cultures  #3 HIV negative, hep C pending     LOS: 4 days   Acey Lav 04/25/2014, 6:01 PM

## 2014-04-25 NOTE — Sedation Documentation (Signed)
Patient is resting comfortably. 

## 2014-04-25 NOTE — Evaluation (Signed)
Physical Therapy Evaluation Patient Details Name: Katherine [name] MRN: [id number] DOB: [birth year] Today's Date: 04/25/2014   History of Present Illness  Patient is [name] 54 y/o female with PMH of history of ESRD on HD,  HTN, IDDM, Lumbar DDD who presents to the ED with complaints of progressive weakness over the past month in both legs with knee buckling precipitating [name] fall today (1/9). MRI of lumbar spine evidence of discitis/osteomyelitis of T10-T11 disc. Pt with injury to her Left 5th Toe 1 month ago. IR- aspiration today 1/13.    Clinical Impression  Patient presents with functional limitations due to deficits listed in PT problem list (see below). Pt with generalized weakness, impaired sensation, balance deficits and pain impacting mobility. Pt requires increased coaxing to participate in therapy. Tolerated standing with Mod [name] however limited by pain. Not safe to return home alone due to inability to ambulate. Pt would benefit from skilled PT to improve transfers, gait, balance and mobility so pt can maximize independence and minimize fall risk prior to return home.   Follow Up Recommendations SNF;Supervision/Assistance - 24 hour    Equipment Recommendations  Other (comment) (defer to SNF)    Recommendations for Other Services OT consult     Precautions / Restrictions Precautions Precautions: Fall Restrictions Weight Bearing Restrictions: No      Mobility  Bed Mobility Overal bed mobility: Needs Assistance Bed Mobility: Rolling;Sidelying to Sit;Sit to Sidelying Rolling: Min guard Sidelying to sit: Min guard;HOB elevated     Sit to sidelying: Min guard General bed mobility comments: Min guard for safety. Cues for log roll technique. Increased time. Pt constantly needing redirection to participate in mobility.  Transfers Overall transfer level: Needs assistance Equipment used: Rolling walker (2 wheeled) Transfers: Sit to/from Stand Sit to Stand: Mod assist          General transfer comment: Mod [name] to stand from EOB with increased effort and time. Cues for hand placement. Pt with partial knee buckling during standing but no LOB.  Ambulation/Gait Ambulation/Gait assistance:  (Not formally assessed however pt able to bring BLEs back towards bed with Mod [name] for balance/support)              Stairs            Wheelchair Mobility    Modified Rankin (Stroke Patients Only)       Balance Overall balance assessment: Needs assistance Sitting-balance support: Feet supported;No upper extremity supported Sitting balance-Leahy Scale: Good Sitting balance - Comments: Able to reach outside BoS w/out LOB or difficulty.   Standing balance support: During functional activity Standing balance-Leahy Scale: Poor Standing balance comment: Requires BUE support for balance/safety and Min-Mod [name] for support.                             Pertinent Vitals/Pain Pain Assessment: 0-10 Pain Score:  (not rated on pain scale.) Pain Location: L foot Pain Descriptors / Indicators: Sore Pain Intervention(s): Limited activity within patient's tolerance;Monitored during session;Premedicated before session;Repositioned    Home Living Family/patient expects to be discharged to:: Private residence Living Arrangements: Alone Available Help at Discharge: Family;Available PRN/intermittently (Caregiver for father with PD.) Type of Home: Mobile home Home Access: Stairs to enter Entrance Stairs-Rails: Right Entrance Stairs-Number of Steps: 2 Home Layout: One level Home Equipment: Cane - single point (3 point cane)      Prior Function Level of Independence: Independent with assistive device(s)  Comments: Pt reports 3 falls in 2 months; drives self to dialysis.     Hand Dominance        Extremity/Trunk Assessment   Upper Extremity Assessment: Defer to OT evaluation;Overall WFL for tasks assessed           Lower Extremity Assessment:  LLE deficits/detail;RLE deficits/detail RLE Deficits / Details: Grossly ~3/5 throughout knee/ankle and 2+/5 hip flexion. LLE Deficits / Details: Grossly ~2/5 hip flexion, 2+/5 knee extension, 2+/5 DF/PF. L toe wrapped.     Communication   Communication: No difficulties  Cognition Arousal/Alertness: Awake/alert Behavior During Therapy: WFL for tasks assessed/performed Overall Cognitive Status: Within Functional Limits for tasks assessed (Pt with tangential speech. Difficult to answer questions directly, constantly getting off topic.)                      General Comments General comments (skin integrity, edema, etc.): Education provided on importance of there ex and mobility while in hospital.    Exercises        Assessment/Plan    PT Assessment Patient needs continued PT services  PT Diagnosis Difficulty walking;Generalized weakness;Acute pain   PT Problem List Decreased strength;Pain;Decreased activity tolerance;Impaired sensation;Decreased balance;Decreased safety awareness;Decreased mobility  PT Treatment Interventions Balance training;Gait training;Patient/family education;Functional mobility training;Therapeutic activities;Therapeutic exercise;DME instruction;Wheelchair mobility training   PT Goals (Current goals can be found in the Care Plan section) Acute Rehab PT Goals Patient Stated Goal: none stated PT Goal Formulation: With patient Time For Goal Achievement: 05/09/14 Potential to Achieve Goals: Fair    Frequency Min 2X/week   Barriers to discharge Decreased caregiver support Pt lives alone.    Co-evaluation               End of Session Equipment Utilized During Treatment: Gait belt Activity Tolerance: Patient limited by pain Patient left: in bed;with call bell/phone within reach;with bed alarm set Nurse Communication: Mobility status         Time: 1118-1200 PT Time Calculation (min) (ACUTE ONLY): 42 min   Charges:   PT  Evaluation $Initial PT Evaluation Tier I: 1 Procedure PT Treatments $Therapeutic Activity: 8-22 mins $Self Care/Home Management: 8-22   PT G CodesAlvie [name]:        Katherine [name], Katherine [name] 04/25/2014, 12:48 PM Katherine HeidelbergShauna Katherine [name], PT, DPT [phone]

## 2014-04-25 NOTE — Progress Notes (Addendum)
ANTIBIOTIC CONSULT NOTE - INITIAL  Pharmacy Consult for vancomycin and ceftazidime Indication: discitis and diabetic infection of foot  Allergies  Allergen Reactions  . Codeine Other (See Comments)    hallucinations   Patient Measurements: Height: 5\' 1"  (154.9 cm) Weight: 189 lb 9.5 oz (86 kg) IBW/kg (Calculated) : 47.8  Vital Signs: Temp: 98.4 F (36.9 C) (01/13 0500) Temp Source: Oral (01/13 0500) BP: 155/76 mmHg (01/13 1326) Pulse Rate: 77 (01/13 1326)  Labs:  Recent Labs  04/23/14 0432 04/24/14 0500  WBC 10.2 9.3  HGB 10.4* 10.5*  PLT 294 269  CREATININE 8.29* 7.12*   Estimated Creatinine Clearance: 9 mL/min (by C-G formula based on Cr of 7.12).  Microbiology: Recent Results (from the past 720 hour(s))  Blood culture (routine x 2)     Status: None (Preliminary result)   Collection Time: 04/21/14  8:36 PM  Result Value Ref Range Status   Specimen Description BLOOD LEFT HAND  Final   Special Requests BOTTLES DRAWN AEROBIC AND ANAEROBIC 3CC  Final   Culture   Final           BLOOD CULTURE RECEIVED NO GROWTH TO DATE CULTURE WILL BE HELD FOR 5 DAYS BEFORE ISSUING A FINAL NEGATIVE REPORT Performed at [facility]    Report Status PENDING  Incomplete  Blood culture (routine x 2)     Status: None (Preliminary result)   Collection Time: 04/21/14  9:37 PM  Result Value Ref Range Status   Specimen Description BLOOD LEFT ARM  Final   Special Requests BOTTLES DRAWN AEROBIC ONLY 3CC  Final   Culture   Final           BLOOD CULTURE RECEIVED NO GROWTH TO DATE CULTURE WILL BE HELD FOR 5 DAYS BEFORE ISSUING A FINAL NEGATIVE REPORT Performed at [facility]    Report Status PENDING  Incomplete   Assessment: 54 yo F admitted with c/o back pain, weakness and a fall. 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 She is now s/p aspiration of T10-T11 disc. She did receive ceftriaxone 2g IV x1 on  1/9. Vancomycin was ordered but never given on 1/10. Now to start vancomycin and ceftazidime.   Goal of Therapy:  Pre-vancomycin trough level 15-25 mcg/ml  Plan:  -never received loading dose of vanc, so will give a 2g load today, then 1g IV qHD -ceftazidime 2g IV x1 now, then 2g IV qHD -f/u any growth from cultures -f/u HD schedule and any changes  Rafan Sanders D. Caprice Wasko, PharmD, BCPS Clinical Pharmacist Pager: [phone] 04/25/2014 4:12 PM

## 2014-04-25 NOTE — Consult Note (Addendum)
[facility] [place] CONSULT [place]   MRN : [id number]  Reason for Consult: Left foot ischemic changes   History of Present Illness: [name] is a 54 y.o. female with a chief complaint of left foot redness and black discoloration of the 4th and 5th toes and generalized lower extremity weakness.  She states this just started 1 week ago and that she was placed on oral antibiotics that did not help.  Her past medical history includes: ESRD on HD, HTN, IDDM, Lumbar DDD. She is currently on a Beta Blocker for hypertension control.       Current Facility-Administered Medications  Medication Dose Route Frequency Provider Last Rate Last Dose  . 0.9 %  sodium chloride infusion  250 mL Intravenous PRN Ron ParkerHarvette C Jenkins, MD   Stopped at 04/22/14 [phone]  . acetaminophen (TYLENOL) tablet 650 mg  650 mg Oral Q6H PRN Ron ParkerHarvette C Jenkins, MD       Or  . acetaminophen (TYLENOL) suppository 650 mg  650 mg Rectal Q6H PRN Ron ParkerHarvette C Jenkins, MD      . alum & mag hydroxide-simeth (MAALOX/MYLANTA) 200-200-20 MG/5ML suspension 30 mL  30 mL Oral Q6H PRN Ron ParkerHarvette C Jenkins, MD   30 mL at 04/23/14 0356  . bupivacaine (PF) (MARCAINE) 0.25 % injection           . clonazePAM (KLONOPIN) tablet 0.5 mg  0.5 mg Oral QHS Ron ParkerHarvette C Jenkins, MD   0.5 mg at 04/24/14 2220  . feeding supplement (NEPRO CARB STEADY) liquid 237 mL  237 mL Oral BID BM Stephanie La, RD   237 mL at 04/24/14 1400  . fentaNYL (SUBLIMAZE) 0.05 MG/ML injection           . HYDROmorphone (DILAUDID) injection 0.5-1 mg  0.5-1 mg Intravenous Q3H PRN Ron ParkerHarvette C Jenkins, MD   1 mg at 04/25/14 1422  . insulin aspart (novoLOG) injection 0-5 Units  0-5 Units Subcutaneous QHS Leatha Gildingostin M Gherghe, MD   3 Units at 04/24/14 2222  . insulin aspart (novoLOG) injection 0-9 Units  0-9 Units Subcutaneous TID WC Leatha Gildingostin M Gherghe, MD   3 Units at 04/25/14 [phone]  . insulin detemir (LEVEMIR) injection 35 Units  35 Units Subcutaneous QHS Leatha Gildingostin M  Gherghe, MD   35 Units at 04/24/14 2221  . lanthanum (FOSRENOL) chewable tablet 1,000 mg  1,000 mg Oral TID WC Ron ParkerHarvette C Jenkins, MD   1,000 mg at 04/24/14 1915  . lanthanum (FOSRENOL) chewable tablet 1,000 mg  1,000 mg Oral BID BM PRN Ron ParkerHarvette C Jenkins, MD   1,000 mg at 04/24/14 1501  . linagliptin (TRADJENTA) tablet 5 mg  5 mg Oral Daily Costin Otelia SergeantM Gherghe, MD   5 mg at 04/24/14 2220  . midazolam (VERSED) 2 MG/2ML injection           . multivitamin (RENA-VIT) tablet 1 tablet  1 tablet Oral QHS Ron ParkerHarvette C Jenkins, MD   1 tablet at 04/24/14 2220  . mupirocin cream (BACTROBAN) 2 %   Topical BID Costin Otelia SergeantM Gherghe, MD      . ondansetron [facility](ZOFRAN) tablet 4 mg  4 mg Oral Q6H PRN Ron ParkerHarvette C Jenkins, MD       Or  . ondansetron (ZOFRAN) injection 4 mg  4 mg Intravenous Q6H PRN Ron ParkerHarvette C Jenkins, MD   4 mg at 04/25/14 1119  . oxyCODONE (Oxy IR/ROXICODONE) immediate release tablet 5 mg  5 mg Oral Q4H PRN Ron ParkerHarvette C Jenkins, MD   5  mg at 04/25/14 1509  . pantoprazole (PROTONIX) EC tablet 40 mg  40 mg Oral Daily Ron Parker, MD   40 mg at 04/24/14 2220  . promethazine (PHENERGAN) tablet 25 mg  25 mg Oral Q6H PRN Ron Parker, MD   25 mg at 04/24/14 1029  . sodium chloride 0.9 % injection 3 mL  3 mL Intravenous Q12H Ron Parker, MD   3 mL at 04/24/14 2222  . sodium chloride 0.9 % injection 3 mL  3 mL Intravenous PRN Harvette Velora Heckler, MD      . sodium chloride 0.9 % injection             Pt meds include: Statin :No Betablocker: Yes ASA: no Other anticoagulants/antiplatelets: no  Past Medical History  Diagnosis Date  . Hypertension   . Anemia   . Diabetes mellitus     Type 2  . Chronic kidney disease     T,Th,Sa dialysis  . GERD (gastroesophageal reflux disease)     protonix for "burping"  . Complication of anesthesia     had trouble being put to sleep and then difficulty waking up. States she doesn't have any issues with Propofol    Past Surgical History  Procedure  Laterality Date  . Insertion of dialysis catheter N/A 07/16/2013    Procedure: INSERTION OF DIALYSIS CATHETER;  Surgeon: Larina Earthly, MD;  Location: [facility] OR;  Service: Vascular;  Laterality: N/A;  . Av fistula placement Right 07/18/2013    Procedure: RIGHT ARTERIOVENOUS (AV) FISTULA CREATION;  Surgeon: Sherren Kerns, MD;  Location: [facility] OR;  Service: Vascular;  Laterality: Right;  . Insertion of dialysis catheter Left 12/22/2013    Procedure: INSERTION OF DIALYSIS CATHETER;  Surgeon: Larina Earthly, MD;  Location: [facility] OR;  Service: Vascular;  Laterality: Left;  . Back surgery  17 years    lumbar surgery  . Eye surgery Bilateral     cataract surgery with lens implants  . Hysteroscopy w/ endometrial ablation    . Fistula superficialization Right 02/16/2014    Procedure:  RIGHT Fistula BRACHIOCEPHALIC Superficialization;  Surgeon: Chuck Hint, MD;  Location: [facility] OR;  Service: Vascular;  Laterality: Right;  . Fistulogram N/A 01/15/2014    Procedure: FISTULOGRAM;  Surgeon: Chuck Hint, MD;  Location: [facility] CATH LAB;  Service: Cardiovascular;  Laterality: N/A;    Social History History  Substance Use Topics  . Smoking status: Never Smoker   . Smokeless tobacco: Never Used  . Alcohol Use: No    Family History Family History  Problem Relation Age of Onset  . Fibromyalgia Mother   . Ovarian cancer Mother   . Rectal cancer Mother   . Parkinson's disease Father     Allergies  Allergen Reactions  . Codeine Other (See Comments)    hallucinations     REVIEW OF SYSTEMS  General:  Weight loss,  Fever,  chills Neurologic:  Dizziness,  Blackouts,  Seizure  Stroke,  "Mini stroke",  Slurred speech,  Temporary blindness; [x ] weakness in arms or legs,  Hoarseness  Dysphagia Cardiac:  Chest pain/pressure,  Shortness of breath at rest  Shortness of breath with exertion,  Atrial fibrillation or irregular heartbeat  Vascular:  Pain in  legs with walking,  Pain in legs at rest,  Pain in legs at night,   Non-healing ulcer,   Blood clot in vein/DVT,   Pulmonary:  Home oxygen,  Productive cough,  Coughing up blood,  Asthma,   Wheezing  COPD Musculoskeletal:   Arthritis,  Low back pain,  Joint pain Hematologic:  Easy Bruising,  Anemia;  Hepatitis Gastrointestinal:  Blood in stool,  Gastroesophageal Reflux/heartburn, Urinary: [x ] chronic Kidney disease,  on HD -  MWF or [x ] TTHS,  Burning with urination,  Difficulty urinating Skin:  Rashes,  Wounds Psychological:  Anxiety,  Depression  Physical Examination Filed Vitals:   04/25/14 1309 04/25/14 1314 04/25/14 1319 04/25/14 1326  BP: 174/82 132/79 134/73 155/76  Pulse: 70 75 75 77  Temp:      TempSrc:      Resp: Height:      Weight:      SpO2: 100% 100% 99% 100%   Body mass index is 35.84 kg/(m^2).  General:  WDWN in NAD Gait: Normal HENT: WNL Eyes: Pupils equal Pulmonary: normal non-labored breathing , without Rales, rhonchi,  wheezing Cardiac: RRR, without  Murmurs, rubs or gallops; No carotid bruits Abdomen: soft, NT, no masses Skin: no rashes, ulcers noted;  no Gangrene , no cellulitis; no open wounds;   Vascular Exam/Pulses:left 3+ femoral 1+ popliteal with doppler PT Right 3+ femoral and popliteal Doppler biphasic Dry Gangrene left 4th and 5th toes, no drainage, and positive fore foot cellulitis   Musculoskeletal: no muscle wasting or atrophy; no edema  Neurologic: A&O X 3; Appropriate Affect ;  SENSATION: normal; MOTOR FUNCTION: 5/5 Symmetric Speech is fluent/normal   Significant Diagnostic Studies: CBC Lab Results  Component Value Date   WBC 9.3 04/24/2014   HGB 10.5* 04/24/2014   HCT 33.9* 04/24/2014   MCV 89.0 04/24/2014   PLT 269 04/24/2014    BMET    Component Value Date/Time   NA 130* 04/24/2014 0500   K 4.8 04/24/2014 0500   CL 91*  04/24/2014 0500   CO2 24 04/24/2014 0500   GLUCOSE 312* 04/24/2014 0500   BUN 31* 04/24/2014 0500   CREATININE 7.12* 04/24/2014 0500   CALCIUM 8.8 04/24/2014 0500   GFRNONAA 6* 04/24/2014 0500   GFRAA 7* 04/24/2014 0500   Estimated Creatinine Clearance: 9 mL/min (by C-G formula based on Cr of 7.12).  COAG Lab Results  Component Value Date   INR 1.44 04/23/2014     Non-Invasive Vascular Imaging:  ABI's monophasic Left 0.63 Right biphasic ant. Tib. 2.33 Summary: ABIs are noncompressible/ falsely elevated due to calcified vessels.     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 Imogene Burn for a better view of her vessels tomorrow by Dr. Imogene Burn.  The ABI study was done and shows non compressible vessels with calcification.  Pending the angiogram she may need an amputation verses revascularization.      Clinton Gallant [facility] 04/25/2014 3:40 PM  Agree with above assessment Patient is diabetic with end-stage renal disease on hemodialysis Tuesday Thursday Saturday. She has left fourth and fifth toe infection with some gangrene and surrounding cellulitis lateral foot. She also has discitis. Currently on vancomycin and Zosyn. On physical exam she has 2-3+ femoral pulse on the left with weak popliteal pulse. She does have brisk flow in the posterior tibial artery left foot.  Suspect diffuse superficial femoral and  tibial occlusive disease Do not think this will heal without revascularization and she will likely need amputation of fourth and fifth toes  Plan angiography tomorrow by Dr. Imogene Burn to see if intervention feasible Have also asked Dr. Aldean Baker to see patient tomorrow for assistance with lateral foot amputation if needed Discussed this with patient and she does understand and agree

## 2014-04-25 NOTE — Progress Notes (Signed)
Per doctor, patient needs to have angiogram done prior to dialysis Thursday 1/14. Passed on to oncoming shift.

## 2014-04-25 NOTE — Progress Notes (Signed)
[facility] PROGRESS NOTE  TOMORROW DEHAAS ZOX:[id number] DOB: 01-May-1960 DOA: 04/21/2014 PCP: Pearla Dubonnet, MD  Assessment/Plan:  Discitis -afebrile, no leukocytosis -MRI of lumbar spine evidence of discitis/osteomyelitis of T10-T11 disc - ID on board, rec's- pt nonseptic, hold off on antibiotics to improve cultures -IR- aspiration today 1/13, Zenaida Niece and Ceftazimide post procedure.  Diabetic foot ulcer -With complaints of toe pain/ inc sensititvy -per ID restart abx if becomes febrile/ worsens -X-ray of left foot- no osteomylitis -MRI foot- no osteomylitis, nonsepcific edema likely due to cellulitis, diabetic myopathy- covered with antibiotics above -Left Venous duplex- no evidence of DVT -Continue pain control when necessary  Elevated troponin - Denies any chest pain -Troponin mildly elevated at 0.27 -Per Cards- no further workup planned  ESRD on dialysis -Nephrology on board -Receives HD T, R, Sat.  Last HD 1/12  Mobitz type I heart block/bradycardia -Remains asymptomatic -2D echo 04/2014- EF 55-60%, mild LVH, mild AS -Repeat EKG- NSR -Atenolol discontinued  Diabetes mellitus -CBGs mildly elevated- pt has been refusing Lantus/levemir -Hemoglobin A1c 9.5 -Continue Levemir 35 U,  SSI,  and Tradjental  daily.  Levemir decreased from 45 due to hypoglycemia   Malignant hypertension -BP stable -Atenolol discontinued due to episode of bradycardia  Anemia of renal disease -Hgb 10.5 -Continue [place]-vit -Repeat CBC in a.m.  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  Anxiety Continue Klonopin QHS  Obesity BMI 36  Diet:  Renal/carb modified with 1200 ml fluid restriction  DVT Prophylaxis SCDs Code Status: Full Family Communication:  No family at bedside Disposition Plan:  Inpatient   Consultants:  ID  Nephrology  Cardiology  Neurosurgery  Procedures:  Joint Aspiration 04/25/13  Antibiotics:  Vancomycin 1/5---1/10  Ceftriaxone 1/9--1/10  HPI/Subjective: DAIZEE FIRMIN is a 54 yo female with PMH of ESRD on HD, HTN, DM, Lumbar DDD that presnts tot he ED with progressively worsening weakness over the past month in both legs and knee buckling, leading to a fall the day of admission, from which she couldn't get up. Aditionally, she sustained an injury to there left 5th toe about a month ago, and due to increased redness, she was placed on IV Vancomycin for cellulitis.  In the ED, x-ray of the left foot showed osteomyelitis,no fracture; MRI of the lumbar spine positive for discitis/osteomyelitis of T10-T11 disc.  Neurosurgery, Dr. Peggye Ley, was consulted and patient was placed on IV vancomycin and Rocephin and referred for medical admission.     Complains of weakness. Denies any cp, sob  Objective: Filed Vitals:   04/25/14 0500  BP: 132/58  Pulse: 68  Temp: 98.4 F (36.9 C)  Resp: 16    Intake/Output Summary (Last 24 hours) at 04/25/14 0912 Last data filed at 04/24/14 2222  Gross per 24 hour  Intake    963 ml  Output    780 ml  Net    183 ml   Filed Weights   04/23/14 1618 04/24/14 0611 04/24/14 1012  Weight: 86.6 kg (190 lb 14.7 oz) 86.6 kg (190 lb 14.7 oz) 86 kg (189 lb 9.5 oz)    Exam:  Gen: Alert Caucasian female in NAD. Chest: clear to auscultate bilaterally, no ronchi or rales  Cardiac: Regular rate and rhythm, holosystolic murmur  Abdomen: soft, non tender, non distended, +bowel sounds. No guarding or rigidity  Extremities: Symmetrical in appearance without edema.  Left 5th toe with cellulitic changes  Neurological: Alert awake oriented to time place and person.  Psychiatric: Appears normal.   Data Reviewed: Basic Metabolic Panel:  Recent Labs Lab 04/21/14 1455 04/22/14 0505 04/23/14 0432 04/24/14 0500   NA 128* 130* 133* 130*  K 4.6 5.0 4.1 4.8  CL 89* 90* 92* 91*  CO2 21 21 25 24   GLUCOSE 460* 361* 48* 312*  BUN 33* 40* 45* 31*  CREATININE 6.44* 7.23* 8.29* 7.12*  CALCIUM 10.0 9.8 9.7 8.8  PHOS  --   --  7.1*  --    Liver Function Tests:  Recent Labs Lab 04/21/14 1455 04/23/14 0432 04/24/14 0500  AST 28  --  60*  ALT 6  --  9  ALKPHOS 106  --  118*  BILITOT 1.4*  --  1.0  PROT 6.9  --  6.5  ALBUMIN 3.1* 2.9* 2.9*   No results for input(s): LIPASE, AMYLASE in the last 168 hours. No results for input(s): AMMONIA in the last 168 hours. CBC:  Recent Labs Lab 04/21/14 1455 04/22/14 0505 04/23/14 0432 04/24/14 0500  WBC 10.7* 10.1 10.2 9.3  NEUTROABS 9.5*  --   --   --   HGB 10.6* 10.3* 10.4* 10.5*  HCT 33.1* 33.2* 33.0* 33.9*  MCV 86.0 86.5 86.8 89.0  PLT 315 342 294 269   Cardiac Enzymes:  Recent Labs Lab 04/22/14 1215 04/23/14 0432  TROPONINI 0.08* 0.27*   BNP (last 3 results)  Recent Labs  07/10/13 1330  PROBNP 15005.0*   CBG:  Recent Labs Lab 04/24/14 0520 04/24/14 1127 04/24/14 1611 04/24/14 2156 04/25/14 0711  GLUCAP 251* 136* 167* 330* 205*    Recent Results (from the past 240 hour(s))  Blood culture (routine x 2)     Status: None (Preliminary result)   Collection Time: 04/21/14  8:36 PM  Result Value Ref Range Status   Specimen Description BLOOD LEFT HAND  Final   Special Requests BOTTLES DRAWN AEROBIC AND ANAEROBIC 3CC  Final   Culture   Final           BLOOD CULTURE RECEIVED NO GROWTH TO DATE CULTURE WILL BE HELD FOR 5 DAYS BEFORE ISSUING A FINAL NEGATIVE REPORT Performed at [facility]    Report Status PENDING  Incomplete  Blood culture (routine x 2)     Status: None (Preliminary result)   Collection Time: 04/21/14  9:37 PM  Result Value Ref Range Status   Specimen Description BLOOD LEFT ARM  Final   Special Requests BOTTLES DRAWN AEROBIC ONLY 3CC  Final   Culture   Final           BLOOD CULTURE RECEIVED NO GROWTH  TO DATE CULTURE WILL BE HELD FOR 5 DAYS BEFORE ISSUING A FINAL NEGATIVE REPORT Performed at [facility]    Report Status PENDING  Incomplete     Studies: Mri Left Foot Without Contrast  04/24/2014   CLINICAL DATA:  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 Injury at base of fifth toe. Evaluate for osteomyelitis. Initial encounter.  EXAM: MRI OF THE LEFT FOREFOOT WITHOUT CONTRAST  TECHNIQUE: Multiplanar, multisequence MR imaging was performed. No intravenous contrast was administered.  COMPARISON:  Radiographs 04/21/2014.  FINDINGS: Examination is mildly motion degraded. There is no evidence of acute fracture, dislocation, bone destruction or marrow edema. The toes and metatarsal phalangeal joints appear normal aside from minimal degenerative changes at the first MTP joint. The alignment is normal at the Lisfranc joint.  There is generalized edema  throughout the subcutaneous fat and muscles of the forefoot without focal fluid collection. There is a small amount of fluid surrounding the flexor tendons. No skin ulceration identified.  IMPRESSION: 1. No evidence of osteomyelitis within the left forefoot. 2. 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   Electronically Signed   By: Roxy Horseman M.D.   On: 04/24/2014 13:32   Dg Chest Port 1v Same Day  04/23/2014   CLINICAL DATA:  Shortness of breath. End-stage renal disease. Status post dialysis today.  EXAM: PORTABLE CHEST - 1 VIEW SAME DAY  COMPARISON:  12/22/2013  FINDINGS: Heart size is normal. Both lungs are clear. No evidence of pleural effusion. Left jugular dual-lumen central venous dialysis catheter remains in place.  IMPRESSION: No acute findings.   Electronically Signed   By: Myles Rosenthal M.D.   On: 04/23/2014 21:50    Scheduled Meds: . clonazePAM  0.5 mg Oral QHS  . feeding supplement (NEPRO CARB STEADY)  237 mL Oral BID BM  . insulin  aspart  0-5 Units Subcutaneous QHS  . insulin aspart  0-9 Units Subcutaneous TID WC  . insulin detemir  35 Units Subcutaneous QHS  . lanthanum  1,000 mg Oral TID WC  . linagliptin  5 mg Oral Daily  . multivitamin  1 tablet Oral QHS  . mupirocin cream   Topical BID  . pantoprazole  40 mg Oral Daily  . sodium chloride  3 mL Intravenous Q12H   Continuous Infusions:   Principal Problem:   Diskitis Active Problems:   Hyponatremia   Diabetes mellitus   HTN (hypertension)   ESRD on dialysis   Anemia of renal disease   Elevated troponin-(0.27)   Bradycardia-transient, resolved   Diabetic foot ulcer   Discitis of lumbar region    Time spent: 45    Illa Level [facility]  [facility] Pager [phone]. If 7PM-7AM, please contact night-coverage at [email or web address], password [facility] 04/25/2014, 9:12 AM  LOS: 4 days

## 2014-04-25 NOTE — Progress Notes (Signed)
Inpatient Diabetes Program Recommendations  AACE/ADA: New Consensus Statement on Inpatient Glycemic Control (2013)  Target Ranges:  Prepandial:   less than 140 mg/dL      Peak postprandial:   less than 180 mg/dL (1-2 hours)      Critically ill patients:  140 - 180 mg/dL   Results for [name], [name] (MRN [id number]) as of 04/25/2014 13:39  Ref. Range 04/24/2014 05:20 04/24/2014 11:27 04/24/2014 16:11 04/24/2014 21:56 04/25/2014 07:11 04/25/2014 11:11  Glucose-Capillary Latest Range: 70-99 mg/dL 409251 (H) 811136 (H) 914167 (H) 330 (H) 205 (H) 97   Current orders for Inpatient glycemic control: Levemir 35 units QHS, Novolog 0-9 units TID with meals, Novolog 0-5 units HS, Tradjenta 5 mg daily  Inpatient Diabetes Program Recommendations Insulin - Basal: Please consider increasing Levemir to 37 units QHS.  Note: In reviewing the chart, noted patient refused Levemir on 04/23/14. As a result CBGs ranged from 136-330 mg/dl on 7/821/12. Patient received Levemir 35 units last night and fasting glucose 205 mg/dl this morning. Please consider increasing Levemir to 37 units QHS.  Thanks, Orlando PennerMarie Cristine Daw, RN, MSN, CCRN, CDE Diabetes Coordinator Inpatient Diabetes Program [phone] (Team Pager) [phone] (AP office) [phone] [facility] office)

## 2014-04-25 NOTE — Progress Notes (Signed)
Subjective:   No complaints, is NPO for aspiration today.   Objective Filed Vitals:   04/24/14 1012 04/24/14 1430 04/24/14 1941 04/25/14 0500  BP: 135/63 145/62 101/45 132/58  Pulse: 66 68 72 68  Temp: 97.6 F (36.4 C) 97.7 F (36.5 C) 98.2 F (36.8 C) 98.4 F (36.9 C)  TempSrc: Oral  Oral Oral  Resp: 18 18 16 16   Height:      Weight: 86 kg (189 lb 9.5 oz)     SpO2: 98% 100% 93% 94%   Physical Exam General: awake and alert. No acute distress.  Heart: RRR Lungs: CTA, unlabored.  Abdomen: soft nontender +BS Extremities: L toe with dressing intact. L foot nonpitting edema.  Dialysis Access: L IJ cath and R AVF  HD: [facility] 85 kgs 2K/2ca 4 hr 400/800 6200 Heparin. L IJ cath and R AVF Calcitriol 0.75 Aranesp 25 q week.  Assessment/Plan: 1. Repeated falls/ LE weakness - found to have T10/11 discitis /osteomyelitis by MRI- for aspiration today and begin vanc and ceftazidime post procedure 2. 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 Culture paperwork on shadow chart.  3. L foot/ toe infection - no osteo by plain films- had received 2 doses of cefazolin outpt 4. L calf pain/ tenderness - doppler neg for DVT 5. ESRD on HD TTS- next HD tomorrow. K+ 4.8 6. DM on insulin/ gliptin A1c 9.5 7. HTN/volume - 132/58 no vol excess, BP's soft, at dry wt, no meds 8. Anemia Hb 10.5, holding aranesp. Watch cbc  Jetty DuhamelBridget Whelan, NP [facility] Beeper [phone] 04/25/2014,10:42 AM  LOS: 4 days   Pt seen, examined and agree w A/P as above.  Vinson Moselleob Azriel Jakob MD pager [phone]    cell [phone] 04/25/2014, 12:30 PM    Additional Objective Labs: Basic Metabolic Panel:  Recent Labs Lab 04/22/14 0505 04/23/14 0432 04/24/14 0500  NA 130* 133* 130*  K 5.0 4.1 4.8  CL 90* 92* 91*  CO2 21 25 24   GLUCOSE 361* 48* 312*  BUN 40* 45* 31*  CREATININE 7.23* 8.29* 7.12*   CALCIUM 9.8 9.7 8.8  PHOS  --  7.1*  --    Liver Function Tests:  Recent Labs Lab 04/21/14 1455 04/23/14 0432 04/24/14 0500  AST 28  --  60*  ALT 6  --  9  ALKPHOS 106  --  118*  BILITOT 1.4*  --  1.0  PROT 6.9  --  6.5  ALBUMIN 3.1* 2.9* 2.9*   No results for input(s): LIPASE, AMYLASE in the last 168 hours. CBC:  Recent Labs Lab 04/21/14 1455 04/22/14 0505 04/23/14 0432 04/24/14 0500  WBC 10.7* 10.1 10.2 9.3  NEUTROABS 9.5*  --   --   --   HGB 10.6* 10.3* 10.4* 10.5*  HCT 33.1* 33.2* 33.0* 33.9*  MCV 86.0 86.5 86.8 89.0  PLT 315 342 294 269   Blood Culture    Component Value Date/Time   SDES BLOOD LEFT ARM 04/21/2014 2137   SPECREQUEST BOTTLES DRAWN AEROBIC ONLY 3CC 04/21/2014 2137   CULT  04/21/2014 2137           BLOOD CULTURE RECEIVED NO GROWTH TO DATE CULTURE WILL BE HELD FOR 5 DAYS BEFORE ISSUING A FINAL NEGATIVE REPORT Performed at [facility]    REPTSTATUS PENDING 04/21/2014 2137    Cardiac Enzymes:  Recent Labs Lab 04/22/14 1215 04/23/14 0432  TROPONINI  0.08* 0.27*   CBG:  Recent Labs Lab 04/24/14 0520 04/24/14 1127 04/24/14 1611 04/24/14 2156 04/25/14 0711  GLUCAP 251* 136* 167* 330* 205*   Iron Studies: No results for input(s): IRON, TIBC, TRANSFERRIN, FERRITIN in the last 72 hours. @ Studies/Results: Mri Left Foot Without Contrast  04/24/2014   CLINICAL DATA:  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 Injury at base of fifth toe. Evaluate for osteomyelitis. Initial encounter.  EXAM: MRI OF THE LEFT FOREFOOT WITHOUT CONTRAST  TECHNIQUE: Multiplanar, multisequence MR imaging was performed. No intravenous contrast was administered.  COMPARISON:  Radiographs 04/21/2014.  FINDINGS: Examination is mildly motion degraded. There is no evidence of acute fracture, dislocation, bone destruction or marrow edema. The toes and metatarsal phalangeal joints appear normal aside from minimal degenerative changes at the  first MTP joint. The alignment is normal at the Lisfranc joint.  There is generalized edema throughout the subcutaneous fat and muscles of the forefoot without focal fluid collection. There is a small amount of fluid surrounding the flexor tendons. No skin ulceration identified.  IMPRESSION: 1. No evidence of osteomyelitis within the left forefoot. 2. 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   Electronically Signed   By: Roxy Horseman M.D.   On: 04/24/2014 13:32   Dg Chest Port 1v Same Day  04/23/2014   CLINICAL DATA:  Shortness of breath. End-stage renal disease. Status post dialysis today.  EXAM: PORTABLE CHEST - 1 VIEW SAME DAY  COMPARISON:  12/22/2013  FINDINGS: Heart size is normal. Both lungs are clear. No evidence of pleural effusion. Left jugular dual-lumen central venous dialysis catheter remains in place.  IMPRESSION: No acute findings.   Electronically Signed   By: Myles Rosenthal M.D.   On: 04/23/2014 21:50   Medications:   . clonazePAM  0.5 mg Oral QHS  . feeding supplement (NEPRO CARB STEADY)  237 mL Oral BID BM  . insulin aspart  0-5 Units Subcutaneous QHS  . insulin aspart  0-9 Units Subcutaneous TID WC  . insulin detemir  35 Units Subcutaneous QHS  . lanthanum  1,000 mg Oral TID WC  . linagliptin  5 mg Oral Daily  . multivitamin  1 tablet Oral QHS  . mupirocin cream   Topical BID  . pantoprazole  40 mg Oral Daily  . sodium chloride  3 mL Intravenous Q12H

## 2014-04-26 ENCOUNTER — Encounter (HOSPITAL_COMMUNITY)
Admission: EM | Disposition: A | Discharge status: Skilled Nursing Facility | Payer: Self-pay | Source: Home / Self Care | Attending: Internal Medicine

## 2014-04-26 DIAGNOSIS — Z01810 Encounter for preprocedural cardiovascular examination: Secondary | ICD-10-CM

## 2014-04-26 HISTORY — PX: LOWER EXTREMITY ANGIOGRAM: SHX5508

## 2014-04-26 LAB — CBC
HCT: 33.2 % — ABNORMAL LOW (ref 36.0–46.0)
Hemoglobin: 10.7 g/dL — ABNORMAL LOW (ref 12.0–15.0)
MCH: 28.5 pg (ref 26.0–34.0)
MCHC: 32.2 g/dL (ref 30.0–36.0)
MCV: 88.5 fL (ref 78.0–100.0)
Platelets: 208 10*3/uL (ref 150–400)
RBC: 3.75 MIL/uL — AB (ref 3.87–5.11)
RDW: 20 % — ABNORMAL HIGH (ref 11.5–15.5)
WBC: 11.8 10*3/uL — AB (ref 4.0–10.5)

## 2014-04-26 LAB — BASIC METABOLIC PANEL
Anion gap: 22 — ABNORMAL HIGH (ref 5–15)
BUN: 28 mg/dL — ABNORMAL HIGH (ref 6–23)
CALCIUM: 10.1 mg/dL (ref 8.4–10.5)
CO2: 25 mmol/L (ref 19–32)
Chloride: 92 mEq/L — ABNORMAL LOW (ref 96–112)
Creatinine, Ser: 6.21 mg/dL — ABNORMAL HIGH (ref 0.50–1.10)
GFR calc Af Amer: 8 mL/min — ABNORMAL LOW (ref >90)
GFR calc non Af Amer: 7 mL/min — ABNORMAL LOW (ref >90)
Glucose, Bld: 109 mg/dL — ABNORMAL HIGH (ref 70–99)
POTASSIUM: 5.3 mmol/L — AB (ref 3.5–5.1)
SODIUM: 139 mmol/L (ref 135–145)

## 2014-04-26 LAB — GLUCOSE, CAPILLARY
GLUCOSE-CAPILLARY: 66 mg/dL — AB (ref 70–99)
GLUCOSE-CAPILLARY: 75 mg/dL (ref 70–99)
Glucose-Capillary: 117 mg/dL — ABNORMAL HIGH (ref 70–99)
Glucose-Capillary: 56 mg/dL — ABNORMAL LOW (ref 70–99)
Glucose-Capillary: 71 mg/dL (ref 70–99)

## 2014-04-26 LAB — POCT ACTIVATED CLOTTING TIME
Activated Clotting Time: 239 seconds
Activated Clotting Time: 178 seconds
Activated Clotting Time: 196 seconds

## 2014-04-26 LAB — PROTIME-INR
INR: 1.32 (ref 0.00–1.49)
Prothrombin Time: 16.5 seconds — ABNORMAL HIGH (ref 11.6–15.2)

## 2014-04-26 SURGERY — ANGIOGRAM, LOWER EXTREMITY
Anesthesia: LOCAL

## 2014-04-26 MED ORDER — PENTAFLUOROPROP-TETRAFLUOROETH EX AERO: 1.0000 "application " | INHALATION_SPRAY | CUTANEOUS | Status: DC | PRN

## 2014-04-26 MED ORDER — SODIUM CHLORIDE 0.9 % IV SOLN
INTRAVENOUS | Status: DC
  Administered 2014-04-26: 23:00:00 via INTRAVENOUS

## 2014-04-26 MED ORDER — ALTEPLASE 2 MG IJ SOLR
2.0000 mg | Freq: Once | INTRAMUSCULAR | Status: AC | PRN
  Filled 2014-04-26: qty 2

## 2014-04-26 MED ORDER — MORPHINE SULFATE 2 MG/ML IJ SOLN
INTRAMUSCULAR | Status: AC
  Filled 2014-04-26: qty 1

## 2014-04-26 MED ORDER — SODIUM CHLORIDE 0.9 % IV SOLN: 250.0000 mL | INTRAVENOUS | Status: DC | PRN

## 2014-04-26 MED ORDER — INSULIN DETEMIR 100 UNIT/ML ~~LOC~~ SOLN
42.0000 [IU] | Freq: Every day | SUBCUTANEOUS | Status: DC
  Filled 2014-04-26: qty 0.42

## 2014-04-26 MED ORDER — HYDROMORPHONE HCL 1 MG/ML IJ SOLN
INTRAMUSCULAR | Status: AC
Start: 2014-04-26 — End: 2014-04-26
  Filled 2014-04-26: qty 1

## 2014-04-26 MED ORDER — HEPARIN SODIUM (PORCINE) 5000 UNIT/ML IJ SOLN: 5000.0000 [IU] | Freq: Three times a day (TID) | INTRAMUSCULAR | Status: DC

## 2014-04-26 MED ORDER — DEXTROSE 50 % IV SOLN
INTRAVENOUS | Status: AC
  Filled 2014-04-26: qty 50

## 2014-04-26 MED ORDER — HEPARIN SODIUM (PORCINE) 5000 UNIT/ML IJ SOLN
5000.0000 [IU] | Freq: Three times a day (TID) | INTRAMUSCULAR | Status: DC
  Administered 2014-04-26 – 2014-05-07 (×27): 5000 [IU] via SUBCUTANEOUS
  Filled 2014-04-26 (×37): qty 1

## 2014-04-26 MED ORDER — SODIUM CHLORIDE 0.9 % IV SOLN: 100.0000 mL | INTRAVENOUS | Status: DC | PRN

## 2014-04-26 MED ORDER — MORPHINE SULFATE 2 MG/ML IJ SOLN
2.0000 mg | INTRAMUSCULAR | Status: DC | PRN
  Administered 2014-04-26 – 2014-04-27 (×2): 2 mg via INTRAVENOUS
  Filled 2014-04-26 (×2): qty 1

## 2014-04-26 MED ORDER — HYDROMORPHONE HCL 1 MG/ML IJ SOLN
INTRAMUSCULAR | Status: AC
  Filled 2014-04-26: qty 1

## 2014-04-26 MED ORDER — INSULIN DETEMIR 100 UNIT/ML ~~LOC~~ SOLN
35.0000 [IU] | Freq: Every day | SUBCUTANEOUS | Status: DC
  Administered 2014-04-27 – 2014-04-29 (×2): 35 [IU] via SUBCUTANEOUS
  Filled 2014-04-26 (×5): qty 0.35

## 2014-04-26 MED ORDER — INSULIN ASPART 100 UNIT/ML ~~LOC~~ SOLN: 4.0000 [IU] | Freq: Three times a day (TID) | SUBCUTANEOUS | Status: DC

## 2014-04-26 MED ORDER — FENTANYL CITRATE 0.05 MG/ML IJ SOLN
INTRAMUSCULAR | Status: AC
  Filled 2014-04-26: qty 2

## 2014-04-26 MED ORDER — NEPRO/CARBSTEADY PO LIQD: 237.0000 mL | ORAL | Status: DC | PRN

## 2014-04-26 MED ORDER — HEPARIN SODIUM (PORCINE) 1000 UNIT/ML DIALYSIS: 1000.0000 [IU] | INTRAMUSCULAR | Status: DC | PRN

## 2014-04-26 MED ORDER — LIDOCAINE-PRILOCAINE 2.5-2.5 % EX CREA: 1.0000 "application " | TOPICAL_CREAM | CUTANEOUS | Status: DC | PRN

## 2014-04-26 MED ORDER — HEPARIN SODIUM (PORCINE) 1000 UNIT/ML IJ SOLN
INTRAMUSCULAR | Status: AC
  Filled 2014-04-26: qty 1

## 2014-04-26 MED ORDER — LIDOCAINE HCL (PF) 1 % IJ SOLN
INTRAMUSCULAR | Status: AC
  Filled 2014-04-26: qty 30

## 2014-04-26 MED ORDER — SODIUM CHLORIDE 0.9 % IJ SOLN
3.0000 mL | Freq: Two times a day (BID) | INTRAMUSCULAR | Status: DC
  Administered 2014-04-26 – 2014-05-01 (×6): 3 mL via INTRAVENOUS

## 2014-04-26 MED ORDER — HEPARIN (PORCINE) IN NACL 2-0.9 UNIT/ML-% IJ SOLN
INTRAMUSCULAR | Status: AC
  Filled 2014-04-26: qty 1000

## 2014-04-26 MED ORDER — ACETAMINOPHEN 325 MG PO TABS: 650.0000 mg | ORAL_TABLET | ORAL | Status: DC | PRN

## 2014-04-26 MED ORDER — LIDOCAINE HCL (PF) 1 % IJ SOLN: 5.0000 mL | INTRAMUSCULAR | Status: DC | PRN

## 2014-04-26 MED ORDER — OXYCODONE-ACETAMINOPHEN 5-325 MG PO TABS
1.0000 | ORAL_TABLET | ORAL | Status: DC | PRN
  Administered 2014-04-26 – 2014-04-27 (×2): 2 via ORAL
  Administered 2014-04-27: 1 via ORAL
  Administered 2014-04-27 – 2014-04-30 (×8): 2 via ORAL
  Filled 2014-04-26 (×10): qty 2

## 2014-04-26 MED ORDER — ONDANSETRON HCL 4 MG/2ML IJ SOLN
4.0000 mg | Freq: Four times a day (QID) | INTRAMUSCULAR | Status: DC | PRN
  Administered 2014-04-26 – 2014-05-02 (×14): 4 mg via INTRAVENOUS
  Filled 2014-04-26 (×17): qty 2

## 2014-04-26 MED ORDER — SODIUM CHLORIDE 0.9 % IJ SOLN: 3.0000 mL | INTRAMUSCULAR | Status: DC | PRN

## 2014-04-26 MED ORDER — MIDAZOLAM HCL 2 MG/2ML IJ SOLN
INTRAMUSCULAR | Status: AC
  Filled 2014-04-26: qty 2

## 2014-04-26 NOTE — Consult Note (Signed)
Reason for Consult: Gangrene left foot fourth and fifth toes Referring Physician: Dr. Melvyn NethLawson  [name] is an 54 y.o. female.  HPI: Patient is a 54 year old woman with diabetes end-stage renal disease peripheral vascular disease with gangrenous changes to the fourth and fifth toes.  Past Medical History  Diagnosis Date  . Hypertension   . Anemia   . Diabetes mellitus     Type 2  . Chronic kidney disease     T,Th,Sa dialysis  . GERD (gastroesophageal reflux disease)     protonix for "burping"  . Complication of anesthesia     had trouble being put to sleep and then difficulty waking up. States she doesn't have any issues with Propofol    Past Surgical History  Procedure Laterality Date  . Insertion of dialysis catheter N/A 07/16/2013    Procedure: INSERTION OF DIALYSIS CATHETER;  Surgeon: Larina Earthlyodd F Early, MD;  [facility];  Service: Vascular;  Laterality: N/A;  . Av fistula placement Right 07/18/2013    Procedure: RIGHT ARTERIOVENOUS (AV) FISTULA CREATION;  Surgeon: Sherren Kernsharles E Fields, MD;  [facility];  Service: Vascular;  Laterality: Right;  . Insertion of dialysis catheter Left 12/22/2013    Procedure: INSERTION OF DIALYSIS CATHETER;  Surgeon: Larina Earthlyodd F Early, MD;  [facility];  Service: Vascular;  Laterality: Left;  . Back surgery  17 years    lumbar surgery  . Eye surgery Bilateral     cataract surgery with lens implants  . Hysteroscopy w/ endometrial ablation    . Fistula superficialization Right 02/16/2014    Procedure:  RIGHT Fistula BRACHIOCEPHALIC Superficialization;  Surgeon: Chuck Hinthristopher S Dickson, MD;  [facility];  Service: Vascular;  Laterality: Right;  . Fistulogram N/A 01/15/2014    Procedure: FISTULOGRAM;  Surgeon: Chuck Hinthristopher S Dickson, MD;  [facility];  Service: Cardiovascular;  Laterality: N/A;    Family History  Problem Relation Age of Onset  . Fibromyalgia Mother   . Ovarian cancer Mother   . Rectal cancer Mother   . Parkinson's  disease Father     Social History:  reports that she has never smoked. She has never used smokeless tobacco. She reports that she does not drink alcohol or use illicit drugs.  Allergies:  Allergies  Allergen Reactions  . Codeine Other (See Comments)    hallucinations    Medications: I have reviewed the patient's current medications.  Results for orders placed or performed during the hospital encounter of 04/21/14 (from the past 48 hour(s))  Glucose, capillary     Status: Abnormal   Collection Time: 04/24/14 11:27 AM  Result Value Ref Range   Glucose-Capillary 136 (H) 70 - 99 mg/dL   Comment 1 Notify RN    Comment 2 Documented in Chart   Glucose, capillary     Status: Abnormal   Collection Time: 04/24/14  4:11 PM  Result Value Ref Range   Glucose-Capillary 167 (H) 70 - 99 mg/dL   Comment 1 Notify RN    Comment 2 Documented in Chart   Glucose, capillary     Status: Abnormal   Collection Time: 04/24/14  9:56 PM  Result Value Ref Range   Glucose-Capillary 330 (H) 70 - 99 mg/dL  Glucose, capillary     Status: Abnormal   Collection Time: 04/25/14  7:11 AM  Result Value Ref Range   Glucose-Capillary 205 (H) 70 - 99 mg/dL  Glucose, capillary     Status: None   Collection Time: 04/25/14  11:11 AM  Result Value Ref Range   Glucose-Capillary 97 70 - 99 mg/dL   Comment 1 Notify RN    Comment 2 Documented in Chart   Glucose, capillary     Status: Abnormal   Collection Time: 04/25/14  4:03 PM  Result Value Ref Range   Glucose-Capillary 101 (H) 70 - 99 mg/dL   Comment 1 Notify RN    Comment 2 Documented in Chart   Glucose, capillary     Status: Abnormal   Collection Time: 04/25/14  9:24 PM  Result Value Ref Range   Glucose-Capillary 162 (H) 70 - 99 mg/dL   Comment 1 Notify RN   Glucose, capillary     Status: Abnormal   Collection Time: 04/26/14  6:28 AM  Result Value Ref Range   Glucose-Capillary 117 (H) 70 - 99 mg/dL   Comment 1 Notify RN     Mri Left Foot Without  Contrast  04/24/2014   CLINICAL DATA:  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 Injury at base of fifth toe. Evaluate for osteomyelitis. Initial encounter.  EXAM: MRI OF THE LEFT FOREFOOT WITHOUT CONTRAST  TECHNIQUE: Multiplanar, multisequence MR imaging was performed. No intravenous contrast was administered.  COMPARISON:  Radiographs 04/21/2014.  FINDINGS: Examination is mildly motion degraded. There is no evidence of acute fracture, dislocation, bone destruction or marrow edema. The toes and metatarsal phalangeal joints appear normal aside from minimal degenerative changes at the first MTP joint. The alignment is normal at the Lisfranc joint.  There is generalized edema throughout the subcutaneous fat and muscles of the forefoot without focal fluid collection. There is a small amount of fluid surrounding the flexor tendons. No skin ulceration identified.  IMPRESSION: 1. No evidence of osteomyelitis within the left forefoot. 2. 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   Electronically Signed   By: Roxy Horseman M.D.   On: 04/24/2014 13:32    Review of Systems  All other systems reviewed and are negative.  Blood pressure 117/67, pulse 66, temperature 98.8 F (37.1 C), temperature source Oral, resp. rate 16, height  (1.549 m), weight 86 kg (189 lb 9.5 oz), SpO2 96 %. Physical Exam On examination patient has painful gangrenous changes to the fourth and fifth toes left foot Assessment/Plan: Assessment gangrenous changes fourth and fifth toes left foot.  Plan: Patient to have arteriogram study today possible revascularization surgery on Friday I will participate with patient's surgical care on Friday if partial ray amputation is necessary.  [name] V 04/26/2014, 6:37 AM

## 2014-04-26 NOTE — Progress Notes (Signed)
[name] [facility]at [facility] reports pt was requesting dilaudid by name c/o severe back pain. She has been sleeping soundly for approx  30 min and remains asleep. Will repeat ACT later.

## 2014-04-26 NOTE — Progress Notes (Signed)
CBG 66. Asymptomatic. 8oz apple juice p.o.  No co pain at this time.

## 2014-04-26 NOTE — Progress Notes (Signed)
VASCULAR LAB PRELIMINARY  PRELIMINARY  PRELIMINARY  PRELIMINARY   ----TEST PERFORMED WHILE PATIENT IN HEMODIALYSIS-----    Right Lower Extremity Vein Map    Right Great Saphenous Vein   Segment Diameter Comment  1. Origin 2.5374mm   2. High Thigh 2.7428mm   3. Mid Thigh 1.3273mm   4. Low Thigh 2.5401mm   5. At Knee 1.5091mm   6. High Calf 1.6852mm Branch, then too small  7. Low Calf mm   8. Ankle mm    mm    mm    mm     Right Small Saphenous Vein - Could not image due to patient unable to move leg due to recent cath at rt groin     Left Lower Extremity Vein Map    Left Great Saphenous Vein   Segment Diameter Comment  1. Origin 2.7628mm   2. High Thigh 1.4398mm   3. Mid Thigh 2.3666mm Branch, then too small  4. Low Thigh mm   5. At Knee mm   6. High Calf mm   7. Low Calf mm   8. Ankle mm    mm    mm    mm     Left Small Saphenous Vein  Segment Diameter Comment  1. Origin 4.7502mm Thickening, chronic clot  2. High Calf 3.1424mm   3. Low Calf 3.2366mm Thickening, chronic clot  4. Ankle mm    mm    mm    mm     Farrel DemarkJill Eunice, RDMS, RVT  04/26/2014, 6:46 PM

## 2014-04-26 NOTE — Progress Notes (Signed)
Site area: rfa Site Prior to Removal:  Level o Pressure Applied For:6920min Manual:   yes Patient Status During Pull:  stable Post Pull Site:  Level 0 Post Pull Instructions Given:  yes Post Pull Pulses Present: palpable Dressing Applied:  clear Bedrest begins @ 1500 Comments:

## 2014-04-26 NOTE — Progress Notes (Addendum)
[facility] PROGRESS NOTE  [name] ZOX:[id number] DOB: 1960/07/26 DOA: 04/21/2014 PCP: Pearla Dubonnet, MD  Summary  [name] is a 54 y.o. female with a history of ESRD on HD, HTN, IDDM, Lumbar DDD who presents to the ED with complaints of progressive weakness over the past month in both legs and her knee buckling and precipitating a fall today. She was not able to get up and was brought to the ED. She also had an injury to her Left 5th Toe 1 month ago and she saw her PCP, and over the month she developed increased redness so her dialysis doctor placed over on IV Vancomycin to treat a cellulitis with her dialysis Rx 4 days ago. In the ED tonight, An X-Ray of the Left Foot was performed and was negative for fracture and findings of Osteomyelitis, and an MRI of the Lumbar Spine which revealed +diskitis/Osteomyelitis of the T10-T11 disk. NeuroSurgery Dr. Tressie Stalker was consulted and patient was placed on IV Vancomycin and Rocephin and referred for medical admission.  She was here seen by infectious disease, cardiology, IR for CT guided aspiration of her discitis which was done on 04/25/2014, she also developed worsening pain in her left fourth and fifth toe which was suspicious for ischemic etiology, vascular surgery was consulted and she is due for arteriogram with a possible revascularization surgery later.   Assessment/Plan:  Discitis -MRI of lumbar spine evidence of discitis/osteomyelitis of T10-T11 disc -ID on board, rec's- IR guided aspirate 1/13, results pending, placed on IV Van and Ceftazimide (day 2) -Aspirate cultures pending  Gangrene toe -4th and 5th digit w/ ischemic changes and worsening pain -Ortho on board, rec's- arteriogram 1/14, possible revascularization surgery/partial amputation Friday, she will be a high-risk candidate for adverse cardio pulmonary outcome, she accepts the risk and wants to proceed with surgery if needed. -X-ray of  left foot- no osteomyelitis.  MRI foot- no osteomylitis, nonsepcific edema likely due to cellulitis/diabetic myopathy.  Left Venous duplex- no evidence of DVT -Continue pain control when necessary -Continue IV Vancomycin and Ceftazimide (day 2)  Elevated troponin - Denies any chest pain -Troponin mildly elevated at 0.27 -Per Cards- no further workup planned  ESRD on dialysis -Nephrology on board -Receives HD T, R, Sat.  Last HD 1/12 -Scheduled for HD today, 1/14  Mobitz type I heart block/bradycardia -Remains asymptomatic -2D echo 04/2014- EF 55-60%, mild LVH, mild AS -Repeat EKG- NSR -Atenolol discontinued  Diabetes mellitus -CBGs controlled -Hemoglobin A1c 9.5 -Levemir dose adjusted  continue SSI,  and Tradjental  daily.     CBG (last 3)   Recent Labs  04/25/14 1603 04/25/14 2124 04/26/14 0628  GLUCAP 101* 162* 117*     Malignant hypertension -BP stable -Atenolol discontinued due to episode of bradycardia  Anemia of renal disease -Hgb 10.5 -Continue Reno-vit -Repeat CBC in a.m.  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  Anxiety Continue Klonopin QHS  Obesity BMI 36  Diet:  Renal/carb modified with 1200 ml fluid restriction  DVT Prophylaxis SCDs -Heparin Code Status: Full Family Communication:  No family at bedside Disposition Plan: Inpatient   Consultants:  ID  Nephrology  Cardiology  Neurosurgery  Vas Surgery  Procedures:  Joint Aspiration 04/25/13  Antibiotics:  IV Vancomycin 1/5---1/10  IV Ceftriaxone 1/9--1/10  IV Vancomycin 1/13>>  IV Ceftazidime 1/13 >>   Subjective:  Patient in bed, denies any chest abdominal  pain, no shortness of breath, does have some pain in the left fourth and fifth toes. No focal weakness.       Objective: Filed Vitals:   04/26/14 0700  BP: 152/67  Pulse:  66  Temp: 98.2 F (36.8 C)  Resp: 16    Intake/Output Summary (Last 24 hours) at 04/26/14 0825 Last data filed at 04/25/14 2202  Gross per 24 hour  Intake    300 ml  Output      0 ml  Net    300 ml   Filed Weights   04/23/14 1618 04/24/14 0611 04/24/14 1012  Weight: 86.6 kg (190 lb 14.7 oz) 86.6 kg (190 lb 14.7 oz) 86 kg (189 lb 9.5 oz)    Exam:  Gen: Alert Caucasian female in NAD. Chest: clear to auscultate bilaterally, no ronchi or rales  Cardiac: Regular rate and rhythm, holosystolic murmur  Abdomen: soft, non tender, non distended, +bowel sounds. No guarding or rigidity  Extremities: Symmetrical in appearance without edema.  Left 5th toe with cellulitic changes Neurological: Alert awake oriented to time place and person.  Psychiatric: Appears normal.   Data Reviewed: Basic Metabolic Panel:  Recent Labs Lab 04/21/14 1455 04/22/14 0505 04/23/14 0432 04/24/14 0500  NA 128* 130* 133* 130*  K 4.6 5.0 4.1 4.8  CL 89* 90* 92* 91*  CO2 21 21 25 24   GLUCOSE 460* 361* 48* 312*  BUN 33* 40* 45* 31*  CREATININE 6.44* 7.23* 8.29* 7.12*  CALCIUM 10.0 9.8 9.7 8.8  PHOS  --   --  7.1*  --    Liver Function Tests:  Recent Labs Lab 04/21/14 1455 04/23/14 0432 04/24/14 0500  AST 28  --  60*  ALT 6  --  9  ALKPHOS 106  --  118*  BILITOT 1.4*  --  1.0  PROT 6.9  --  6.5  ALBUMIN 3.1* 2.9* 2.9*   No results for input(s): LIPASE, AMYLASE in the last 168 hours. No results for input(s): AMMONIA in the last 168 hours. CBC:  Recent Labs Lab 04/21/14 1455 04/22/14 0505 04/23/14 0432 04/24/14 0500  WBC 10.7* 10.1 10.2 9.3  NEUTROABS 9.5*  --   --   --   HGB 10.6* 10.3* 10.4* 10.5*  HCT 33.1* 33.2* 33.0* 33.9*  MCV 86.0 86.5 86.8 89.0  PLT 315 342 294 269   Cardiac Enzymes:  Recent Labs Lab 04/22/14 1215 04/23/14 0432  TROPONINI 0.08* 0.27*   BNP (last 3 results)  Recent Labs  07/10/13 1330  PROBNP 15005.0*   CBG:  Recent Labs Lab 04/25/14 0711  04/25/14 1111 04/25/14 1603 04/25/14 2124 04/26/14 0628  GLUCAP 205* 97 101* 162* 117*    Recent Results (from the past 240 hour(s))  Blood culture (routine x 2)     Status: None (Preliminary result)   Collection Time: 04/21/14  8:36 PM  Result Value Ref Range Status   Specimen Description BLOOD LEFT HAND  Final   Special Requests BOTTLES DRAWN AEROBIC AND ANAEROBIC 3CC  Final   Culture   Final           BLOOD CULTURE RECEIVED NO GROWTH TO DATE CULTURE WILL BE HELD FOR 5 DAYS BEFORE ISSUING A FINAL NEGATIVE REPORT [facility]formed at [facility]    Report Status PENDING  Incomplete  Blood culture (routine x 2)     Status: None (Preliminary result)   Collection Time: 04/21/14  9:37 PM  Result Value Ref Range Status   Specimen  Description BLOOD LEFT ARM  Final   Special Requests BOTTLES DRAWN AEROBIC ONLY 3CC  Final   Culture   Final           BLOOD CULTURE RECEIVED NO GROWTH TO DATE CULTURE WILL BE HELD FOR 5 DAYS BEFORE ISSUING A FINAL NEGATIVE REPORT Performed at [facility]    Report Status PENDING  Incomplete  Culture, routine-abscess     Status: None (Preliminary result)   Collection Time: 04/25/14 12:52 PM  Result Value Ref Range Status   Specimen Description ABSCESS  Final   Special Requests ABSC T10 T11 DISC  Final   Gram Stain   Final    FEW WBC PRESENT,BOTH PMN AND MONONUCLEAR NO SQUAMOUS EPITHELIAL CELLS SEEN NO ORGANISMS SEEN Performed at [facility]    Culture   Final    NO GROWTH 1 DAY Performed at [facility]    Report Status PENDING  Incomplete     Studies: Mri Left Foot Without Contrast  04/24/2014   CLINICAL DATA:  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 Injury at base of fifth toe. Evaluate for osteomyelitis. Initial encounter.  EXAM: MRI OF THE LEFT FOREFOOT WITHOUT CONTRAST  TECHNIQUE: Multiplanar, multisequence MR imaging was performed. No intravenous contrast was administered.  COMPARISON:  Radiographs  04/21/2014.  FINDINGS: Examination is mildly motion degraded. There is no evidence of acute fracture, dislocation, bone destruction or marrow edema. The toes and metatarsal phalangeal joints appear normal aside from minimal degenerative changes at the first MTP joint. The alignment is normal at the Lisfranc joint.  There is generalized edema throughout the subcutaneous fat and muscles of the forefoot without focal fluid collection. There is a small amount of fluid surrounding the flexor tendons. No skin ulceration identified.  IMPRESSION: 1. No evidence of osteomyelitis within the left forefoot. 2. 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   Electronically Signed   By: Roxy Horseman M.D.   On: 04/24/2014 13:32    Scheduled Meds: . cefTAZidime (FORTAZ)  IV  2 g Intravenous Q T,Th,S,Su-1800  . clonazePAM  0.5 mg Oral QHS  . feeding supplement (NEPRO CARB STEADY)  237 mL Oral BID BM  . insulin aspart  0-5 Units Subcutaneous QHS  . insulin aspart  0-9 Units Subcutaneous TID WC  . insulin aspart  4 Units Subcutaneous TID WC  . insulin detemir  42 Units Subcutaneous QHS  . lanthanum  1,000 mg Oral TID WC  . linagliptin  5 mg Oral Daily  . metroNIDAZOLE  500 mg Oral Q8H  . multivitamin  1 tablet Oral QHS  . mupirocin cream   Topical BID  . pantoprazole  40 mg Oral Daily  . sodium chloride  3 mL Intravenous Q12H  . vancomycin  1,000 mg Intravenous Q T,Th,Sa-HD   Continuous Infusions:   Principal Problem:   Diskitis Active Problems:   Hyponatremia   Diabetes mellitus   HTN (hypertension)   ESRD on dialysis   Anemia of renal disease   Elevated troponin-(0.27)   Bradycardia-transient, resolved   Diabetic foot ulcer   Discitis of lumbar region   Diabetic infection of left foot   Necrotic toes    Time spent: 45    Illa Level [facility]  [facility] Pager [phone]. If 7PM-7AM, please contact  night-coverage at [email or web address], password [facility] 04/26/2014, 8:25 AM  LOS: 5 days       SINGH,PRASHANT K  M.D on 04/26/2014 at 9:35 AM  Between 7am to 7pm - Pager - [phone], After 7pm go to [email or web address] - password [email or web address]  And look for the night coverage person covering me after hours  [facility]  Office  [phone]

## 2014-04-26 NOTE — Progress Notes (Signed)
Note per pharmacy advice:  Dilaudid 0.5mg  was entered to be given in PIX[facility], and the full 1.0mg  was given. While [facility] may note 0.5mg  remains to be wasted, [facility]. No edit function is available.

## 2014-04-26 NOTE — Procedures (Signed)
I was present at this dialysis session. I have reviewed the session itself and made appropriate changes.  Angiogram results and plan noted for tomorrow.  Using AVF, 2 15g Qb 400 today.  Still has TDC in place.  Goal UF 2L today. Next HD tentatively 04/28/14  Sabra Heckyan Kinsey Karch  MD 04/26/2014, 3:44 PM

## 2014-04-26 NOTE — H&P (Signed)
  [facility]  History and Physical Update  The patient was interviewed and re-examined.  The patient's previous History and Physical has been reviewed and is unchanged from Dr. Candie ChromanLawson's consult yesterday.  There is no change in the plan of care: Aortogram, Bilateral leg runoff, and possible left leg runoff.  Laboratory: CBC:    Component Value Date/Time   WBC 11.8* 04/26/2014 0828   RBC 3.75* 04/26/2014 0828   RBC 4.09 07/11/2013 0340   HGB 10.7* 04/26/2014 0828   HCT 33.2* 04/26/2014 0828   PLT 208 04/26/2014 0828   MCV 88.5 04/26/2014 0828   MCH 28.5 04/26/2014 0828   MCHC 32.2 04/26/2014 0828   RDW 20.0* 04/26/2014 0828   LYMPHSABS 0.6* 04/21/2014 1455   MONOABS 0.5 04/21/2014 1455   EOSABS 0.1 04/21/2014 1455   BASOSABS 0.0 04/21/2014 1455    BMP:    Component Value Date/Time   NA 139 04/26/2014 0828   K 5.3* 04/26/2014 0828   CL 92* 04/26/2014 0828   CO2 25 04/26/2014 0828   GLUCOSE 109* 04/26/2014 0828   BUN 28* 04/26/2014 0828   CREATININE 6.21* 04/26/2014 0828   CALCIUM 10.1 04/26/2014 0828   GFRNONAA 7* 04/26/2014 0828   GFRAA 8* 04/26/2014 0828    Coagulation: Lab Results  Component Value Date   INR 1.32 04/26/2014   INR 1.44 04/23/2014   No results found for: PTT   Leonides SakeBrian Wanna Gully, MD [facility] [place] Office: [phone] Pager: [phone]  04/26/2014, 9:19 AM

## 2014-04-26 NOTE — Progress Notes (Signed)
Patient ID: [name], female   DOB: 05/26/1960, 54 y.o.   MRN: [id number] Vascular Surgery Progress Note  Subjective: Severe ischemia left fourth and fifth toes secondary to tibial occlusive disease  Objective:  Filed Vitals:   04/26/14 0700  BP: 152/67  Pulse: 66  Temp: 98.2 F (36.8 C)  Resp: 16    Patient currently undergoing angiography with attempted stent insertion left superficial femoral artery   Labs:  Recent Labs Lab 04/23/14 0432 04/24/14 0500 04/26/14 0828  CREATININE 8.29* 7.12* 6.21*    Recent Labs Lab 04/23/14 0432 04/24/14 0500 04/26/14 0828  NA 133* 130* 139  K 4.1 4.8 5.3*  CL 92* 91* 92*  CO2 25 24 25   BUN 45* 31* 28*  CREATININE 8.29* 7.12* 6.21*  GLUCOSE 48* 312* 109*  CALCIUM 9.7 8.8 10.1    Recent Labs Lab 04/23/14 0432 04/24/14 0500 04/26/14 0828  WBC 10.2 9.3 11.8*  HGB 10.4* 10.5* 10.7*  HCT 33.0* 33.9* 33.2*  PLT 294 269 208    Recent Labs Lab 04/23/14 1230 04/26/14 0828  INR 1.44 1.32    I/O last 3 completed shifts: In: 303 [P.O.:300; I.V.:3] Out: -   Imaging: Mri Left Foot Without Contrast  04/24/2014   CLINICAL DATA:  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 Injury at base of fifth toe. Evaluate for osteomyelitis. Initial encounter.  EXAM: MRI OF THE LEFT FOREFOOT WITHOUT CONTRAST  TECHNIQUE: Multiplanar, multisequence MR imaging was performed. No intravenous contrast was administered.  COMPARISON:  Radiographs 04/21/2014.  FINDINGS: Examination is mildly motion degraded. There is no evidence of acute fracture, dislocation, bone destruction or marrow edema. The toes and metatarsal phalangeal joints appear normal aside from minimal degenerative changes at the first MTP joint. The alignment is normal at the Lisfranc joint.  There is generalized edema throughout the subcutaneous fat and muscles of the forefoot without focal fluid collection. There is a small amount of fluid surrounding the flexor  tendons. No skin ulceration identified.  IMPRESSION: 1. No evidence of osteomyelitis within the left forefoot. 2. 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   Electronically Signed   By: Roxy HorsemanBill  Veazey M.D.   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 Popliteal artery is occluded with only reconstitution distally is small posterior tibial artery at the ankle. Patient will in all likelihood require below-knee amputation if revascularization is not successful. Dr. Imogene Burnhen will attempt PTA and stenting of superficial femoral artery and if this is successful I have recommended attempt at popliteal to posterior tibial bypass using saphenous vein from left leg. I discussed this with patient's aunt and have given her about a 50-50 chance of this being successful. Artery is very small and we do not know the quality of the vein. Dr. Aldean BakerMarcus Duda will see patient today and make decision regarding need for fourth and fifth toe amputation and timing.  We will post her for surgery tomorrow and I will discuss this further with her in the a.m. She does not want to proceed then will go by Dr. Audrie Liauda's recommendation regarding amputation. If she does want to proceed he may decide to perform amputation following revascularization procedures either at the same time or later  date. This was all discussed at length with patient's aunt. Patient is currently sedated and Dr. Imogene Burn will discuss it further with her later in the day   Josephina Gip, MD 04/26/2014 11:07 AM

## 2014-04-26 NOTE — Progress Notes (Signed)
Pt now reports pain in left foot.Plan to recheck CBG and remove arterial sheath in 15 .

## 2014-04-26 NOTE — Progress Notes (Signed)
8oz coke givento pt po by Phelps DodgeHarriott M

## 2014-04-26 NOTE — Op Note (Signed)
OPERATIVE NOTE   PROCEDURE: 1.  Right common femoral artery cannulation under ultrasound guidance 2.  Placement of catheter in aorta 3.  Aortogram 4.  Left second order arterial selecgtion 5.  Left leg runoff 6.  Angioplasty and stenting of Left superficial femoral artery (5 mm x 30 mm self-expanding) 7.  Right leg runoff  PRE-OPERATIVE DIAGNOSIS: Left foot gangrene  POST-OPERATIVE DIAGNOSIS: same as above   SURGEON: Adele Barthel, MD  ANESTHESIA: conscious sedation  ESTIMATED BLOOD LOSS: 50 cc  CONTRAST: 150 cc  FINDING(S):  Aorta: patent  Superior mesenteric artery: patent  Right Left  RA Poor filling Patent, no nephrogram  CIA Patent but calcified Patent but calcified  EIA patent patent  IIA patent patent  CFA patent patent  SFA Patent but diseased diffusely with calcific disease Patent but diseased diffusely with calcific disease, distal stenosis >90%: resolved with stenting  PFA Patent Patent  Pop Patent Patent proximally, occludes at knee level  Trif Patent Occluded  AT Occluded Occluded  Pero Patent Occluded  PT Patent Occluded, reconstituted distally with miniscule calcified runoff   SPECIMEN(S):  none  INDICATIONS:   [name] is a 54 y.o. female who presents with left foot gangrene.  The patient presents for: aortogram, bilateral runoff, and possible intervention.  I discussed with the patient the nature of angiographic procedures, especially the limited patencies of any endovascular intervention.  The patient is aware of that the risks of an angiographic procedure include but are not limited to: bleeding, infection, access site complications, renal failure, embolization, rupture of vessel, dissection, possible need for emergent surgical intervention, possible need for surgical procedures to treat the patient's pathology, and stroke and death.  The patient is aware of the risks and agrees to proceed.  DESCRIPTION: After full informed consent was  obtained from the patient, the patient was brought back to the angiography suite.  The patient was placed supine upon the angiography table and connected to monitoring equipment.  The patient was then given conscious sedation, the amounts of which are documented in the patient's chart.  The patient was prepped and drape in the standard fashion for an angiographic procedure.  At this point, attention was turned to the right groin.  Under ultrasound guidance, the right common femoral artery was cannulated with a micropuncture needle.  The microwire was advanced into the iliac arterial system.  The needle was exchanged for a microsheath, which was loaded into the common femoral artery over the wire.  The microwire was exchanged for a [facility] wire which was advanced into the aorta.  The microsheath was then exchanged for a 5-Fr sheath which was loaded into the common femoral artery.  The Omniflush catheter was then loaded over the wire up to the level of L1.  The catheter was connected to the power injector circuit.  After de-airring and de-clotting the circuit, a power injector aortogram was completed.  The findings are listed above.  The [facility] wire was replaced in the catheter, and using the [place] and Omniflush catheter, the left common iliac artery was selected.  The wire was advanced into the external iliac artery but the catheter would not cross the narrow bifurcation.  I exchanged the catheter for a 4-Fr end-hole catheter, which I could get down to distal external iliac artery.  The wire was removed and the catheter was connected to the power injector circuit.  An automated left leg runoff was completed.  The findings are listed above.   I  also completed a left lateral runoff to verify the distal findings.  At this point, Dr. Kellie Simmering reviewed the images and decided he would attempt a left popliteal to posterior tibial artery bypass if I could get the distal superficial femoral artery stenosis open.  The wire was  exchanged for an Amplatz wire.  The wire kicked the catheter back into the proximal common iliac artery, so I felt it was going to be too stiff.  I exchanged the wire for an Rosen wire which was advanced into the distal external iliac artery.  The patient's right femoral sheath was exchanged for a 6-Fr Destination sheath, which was lodged in the left common femoral artery.  The dilator was removed.  The patient was given 8000 units of Heparin intravenously, which was a therapeutic bolus.  I then loaded a Sleek catheter over the wire and exchanged the wire for a long Versacore wire.  Using this combination, I was able to cross the near occlusion in the distal superficial femoral artery.  I removed the catheter and did a hand injection to measure the size of the superficial femoral artery in the vicinity of the near occlusion.  At the artery was heavily calcified, I did not think it could be easily angioplastied.  I elected to stent this segment with 5 mm x 40 mm self expanding stent.  I was loaded into the left superficial femoral artery and centered on the stenosis.  In deploying the stent, it migrated a distally, but was still able to open the stenosis.  I post-dilated this stent with a 4 mm x 30 mm balloon at 10 atm for 1 minute.  Completion injection demonstrated opening of the proximal portion of the stent due to heavy calcification.  I recentered the balloon on the proximal portion of the stent and inflated to 12 atm for 1 minute.  On completion injection, this appeared to resolve the suboptimal deployment.  At this point, the wire and balloon were removed.   The sheath was pulled back into the right external iliac artery.  The sheath was aspirated.  No clots were present and the sheath was reloaded with heparinized saline.  The right sheath was connected to the power injector.  An automated right leg runoff was completed.  The findings are listed above.  The sheath was aspirated.  No clots were present and  the sheath was reloaded with heparinized saline.    COMPLICATIONS: none  CONDITION: stable   Adele Barthel, MD [facility] [place] Office: [phone] Pager: [phone]  04/26/2014, 12:01 PM

## 2014-04-27 ENCOUNTER — Encounter (HOSPITAL_COMMUNITY): Payer: Self-pay | Admitting: Vascular Surgery

## 2014-04-27 ENCOUNTER — Encounter (HOSPITAL_COMMUNITY)
Admission: EM | Disposition: A | Discharge status: Skilled Nursing Facility | Payer: Self-pay | Source: Home / Self Care | Attending: Internal Medicine

## 2014-04-27 DIAGNOSIS — E1152 Type 2 diabetes mellitus with diabetic peripheral angiopathy with gangrene: Secondary | ICD-10-CM

## 2014-04-27 LAB — CBC
HCT: 35.2 % — ABNORMAL LOW (ref 36.0–46.0)
Hemoglobin: 10.9 g/dL — ABNORMAL LOW (ref 12.0–15.0)
MCH: 28.2 pg (ref 26.0–34.0)
MCHC: 31 g/dL (ref 30.0–36.0)
MCV: 91.2 fL (ref 78.0–100.0)
Platelets: 104 K/uL — ABNORMAL LOW (ref 150–400)
RBC: 3.86 MIL/uL — ABNORMAL LOW (ref 3.87–5.11)
RDW: 20.2 % — ABNORMAL HIGH (ref 11.5–15.5)
WBC: 9 K/uL (ref 4.0–10.5)

## 2014-04-27 LAB — GLUCOSE, CAPILLARY
GLUCOSE-CAPILLARY: 282 mg/dL — AB (ref 70–99)
Glucose-Capillary: 210 mg/dL — ABNORMAL HIGH (ref 70–99)
Glucose-Capillary: 258 mg/dL — ABNORMAL HIGH (ref 70–99)
Glucose-Capillary: 310 mg/dL — ABNORMAL HIGH (ref 70–99)

## 2014-04-27 LAB — BASIC METABOLIC PANEL
Anion gap: 20 — ABNORMAL HIGH (ref 5–15)
BUN: 12 mg/dL (ref 6–23)
CO2: 21 mmol/L (ref 19–32)
CREATININE: 3.96 mg/dL — AB (ref 0.50–1.10)
Calcium: 8.8 mg/dL (ref 8.4–10.5)
Chloride: 97 mEq/L (ref 96–112)
GFR calc Af Amer: 14 mL/min — ABNORMAL LOW (ref >90)
GFR, EST NON AFRICAN AMERICAN: 12 mL/min — AB (ref >90)
GLUCOSE: 247 mg/dL — AB (ref 70–99)
Potassium: 4.9 mmol/L (ref 3.5–5.1)
Sodium: 138 mmol/L (ref 135–145)

## 2014-04-27 LAB — PROTIME-INR
INR: 1.76 — ABNORMAL HIGH (ref 0.00–1.49)
Prothrombin Time: 20.7 s — ABNORMAL HIGH (ref 11.6–15.2)

## 2014-04-27 LAB — HEPATITIS C ANTIBODY (REFLEX): HCV AB: NEGATIVE

## 2014-04-27 SURGERY — CREATION, BYPASS, ARTERIAL, POPLITEAL TO TIBIAL, USING GRAFT
Anesthesia: General

## 2014-04-27 MED ORDER — CETYLPYRIDINIUM CHLORIDE 0.05 % MT LIQD
7.0000 mL | Freq: Two times a day (BID) | OROMUCOSAL | Status: DC
  Administered 2014-04-27 – 2014-05-07 (×12): 7 mL via OROMUCOSAL

## 2014-04-27 MED ORDER — ASPIRIN 81 MG PO CHEW
81.0000 mg | CHEWABLE_TABLET | Freq: Every day | ORAL | Status: DC
  Administered 2014-04-27 – 2014-05-07 (×10): 81 mg via ORAL
  Filled 2014-04-27 (×11): qty 1

## 2014-04-27 MED ORDER — ONDANSETRON HCL 4 MG PO TABS
24.0000 mg | ORAL_TABLET | Freq: Four times a day (QID) | ORAL | Status: DC | PRN
  Administered 2014-04-27 – 2014-04-30 (×3): 24 mg via ORAL
  Filled 2014-04-27 (×3): qty 6

## 2014-04-27 SURGICAL SUPPLY — 43 items
BANDAGE ESMARK 6X9 LF (GAUZE/BANDAGES/DRESSINGS) IMPLANT
BNDG CMPR 9X6 STRL LF SNTH (GAUZE/BANDAGES/DRESSINGS)
BNDG ESMARK 6X9 LF (GAUZE/BANDAGES/DRESSINGS)
BOOT SUTURE AID YELLOW STND (SUTURE) IMPLANT
CANISTER SUCTION 2500CC (MISCELLANEOUS) ×5 IMPLANT
CLIP TI MEDIUM 24 (CLIP) ×5 IMPLANT
CLIP TI WIDE RED SMALL 24 (CLIP) ×5 IMPLANT
COVER SURGICAL LIGHT HANDLE (MISCELLANEOUS) ×5 IMPLANT
DECANTER SPIKE VIAL GLASS SM (MISCELLANEOUS) IMPLANT
DRAIN SNY 10X20 3/4 PERF (WOUND CARE) IMPLANT
DRAPE X-RAY CASS 24X20 (DRAPES) IMPLANT
ELECT REM PT RETURN 9FT ADLT (ELECTROSURGICAL) ×4
ELECTRODE REM PT RTRN 9FT ADLT (ELECTROSURGICAL) ×3 IMPLANT
EVACUATOR SILICONE 100CC (DRAIN) IMPLANT
GLOVE SS BIOGEL STRL SZ 7 (GLOVE) ×3 IMPLANT
GLOVE SUPERSENSE BIOGEL SZ 7 (GLOVE) ×2
GOWN STRL REUS W/ TWL LRG LVL3 (GOWN DISPOSABLE) ×9 IMPLANT
GOWN STRL REUS W/TWL LRG LVL3 (GOWN DISPOSABLE) ×12
INSERT FOGARTY SM (MISCELLANEOUS) ×5 IMPLANT
KIT BASIN OR (CUSTOM PROCEDURE TRAY) ×5 IMPLANT
KIT ROOM TURNOVER OR (KITS) ×5 IMPLANT
LIQUID BAND (GAUZE/BANDAGES/DRESSINGS) ×5 IMPLANT
NS IRRIG 1000ML POUR BTL (IV SOLUTION) ×10 IMPLANT
PACK PERIPHERAL VASCULAR (CUSTOM PROCEDURE TRAY) ×5 IMPLANT
PAD ARMBOARD 7.5X6 YLW CONV (MISCELLANEOUS) ×10 IMPLANT
PADDING CAST COTTON 6X4 STRL (CAST SUPPLIES) IMPLANT
SET COLLECT BLD 21X3/4 12 (NEEDLE) IMPLANT
STOPCOCK 4 WAY LG BORE MALE ST (IV SETS) IMPLANT
SUT PROLENE 5 0 C1 (SUTURE) IMPLANT
SUT PROLENE 6 0 BV (SUTURE) ×5 IMPLANT
SUT PROLENE 6 0 CC (SUTURE) IMPLANT
SUT PROLENE 7 0 BV 1 (SUTURE) IMPLANT
SUT PROLENE 7 0 BV1 MDA (SUTURE) IMPLANT
SUT SILK 2 0 SH (SUTURE) ×5 IMPLANT
SUT SILK 3 0 (SUTURE)
SUT SILK 3-0 18XBRD TIE 12 (SUTURE) IMPLANT
SUT VIC AB 2-0 CTX 36 (SUTURE) ×10 IMPLANT
SUT VIC AB 3-0 SH 27 (SUTURE) ×8
SUT VIC AB 3-0 SH 27X BRD (SUTURE) ×6 IMPLANT
TRAY FOLEY CATH 16FRSI W/METER (SET/KITS/TRAYS/PACK) ×5 IMPLANT
TUBING EXTENTION W/L.L. (IV SETS) IMPLANT
UNDERPAD 30X30 INCONTINENT (UNDERPADS AND DIAPERS) ×5 IMPLANT
WATER STERILE IRR 1000ML POUR (IV SOLUTION) ×5 IMPLANT

## 2014-04-27 NOTE — Progress Notes (Signed)
Patient ID: [name], female   DOB: 12-18-1960, 54 y.o.   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 Patient had angiography and stenting of left superficial femoral artery yesterday by Dr. Imogene Burn.  Objective:  Filed Vitals:   04/27/14 0438  BP: 119/71  Pulse: 97  Temp: 97.4 F (36.3 C)  Resp: 30    Dressing on left foot intact with 3+ femoral pulse palpable on left with absent popliteal and distal pulses. Audible flow in posterior tibial artery.   Labs:  Recent Labs Lab 04/23/14 0432 04/24/14 0500 04/26/14 0828  CREATININE 8.29* 7.12* 6.21*    Recent Labs Lab 04/23/14 0432 04/24/14 0500 04/26/14 0828  NA 133* 130* 139  K 4.1 4.8 5.3*  CL 92* 91* 92*  CO2 BUN 45* 31* 28*  CREATININE 8.29* 7.12* 6.21*  GLUCOSE 48* 312* 109*  CALCIUM 9.7 8.8 10.1    Recent Labs Lab 04/23/14 0432 04/24/14 0500 04/26/14 0828  WBC 10.2 9.3 11.8*  HGB 10.4* 10.5* 10.7*  HCT 33.0* 33.9* 33.2*  PLT 294 269 208    Recent Labs Lab 04/23/14 1230 04/26/14 0828  INR 1.44 1.32    I/O last 3 completed shifts: In: 360 [P.O.:360] Out: 2100 [Other:2100]  Imaging: Ir Fluoro Guide Ndl Plmt / Bx  04/26/2014   CLINICAL DATA:  Severe low back pain secondary to 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  EXAM: IR FLUORO GUIDE NEEDLE PLACEMENT /BIOPSY  ANESTHESIA/SEDATION: Conscious sedation.  MEDICATIONS: Versed 1 mg IV.  Fentanyl 50 mcg IV.  CONTRAST:  None.  PROCEDURE: Following a full explanation of the procedure along with the potential associated complications, an informed witnessed consent was obtained.  The patient was placed prone of early fluoroscopic table.  The skin overlying the thoracic region was then prepped and draped in the usual sterile fashion. The skin entry site overlying the right paramedian T10-T11 disc  space was then infiltrated with 0.25% bupivacaine.  Using biplane intermittent fluoroscopy, a 22 gauge Franseen needle was then advanced into the T10-11 disk space without difficulty.  Using a 20 mL syringe, aspirate was obtained for analysis. A second pass was then made in a similar fashion with a new sterile needle. Using a 20 mL syringe, bloody aspirate was obtained and sent for microbiologic analysis.  A total of 2 mL of blood stained fluid was obtained.  Hemostasis was achieved at the skin entry site. The patient tolerated the procedure well.  COMPLICATIONS: None immediate.  FINDINGS: Status post fluoroscopic guided disc aspiration at T10-T11 for discitis.  IMPRESSION: As above.   Electronically Signed   By: Julieanne Cotton M.D.   On: 04/26/2014 10:01    Assessment/Plan:   LOS: 6 days  s/p Procedure(s): LOWER EXTREMITY ANGIOGRAM ABDOMINAL ANGIOGRAM PERCUTANEOUS STENT INTERVENTION  I have reviewed the angiogram and Dr. Imogene Burn was successful in dilating and stenting left superficial femoral artery. Popliteal artery is occluded at the knee with multiple collaterals filling the foot. Only distal vessel which reconstitutes is the posterior tibial artery at the ankle level which is a 1 mm vessel and is calcified. Vein mapping reveals very poor saphenous vein for conduit and left leg.  I do not think  attempt at bypass to the small calcified vessel is indicated or will succeed particularly in light of very poor conduit-saphenous vein. I have discussed this with patient and her  aunt and they seem to understand.. Dr. Lajoyce Cornersuda will see patient later today to make definitive plans regarding left foot. Have canceled bypass surgery.  Josephina GipJames Lawson, MD 04/27/2014 7:41 AM

## 2014-04-27 NOTE — Progress Notes (Signed)
[facility]    Subjective: less pain in toe today    Antibiotics:  Anti-infectives    Start     Dose/Rate Route Frequency Ordered Stop   04/26/14 1800  cefTAZidime (FORTAZ) 2 g in dextrose 5 % 50 mL IVPB     2 g100 mL/hr over 30 Minutes Intravenous Every T-Th-S-Su (1800) 04/25/14 1618     04/26/14 1200  vancomycin (VANCOCIN) IVPB 1000 mg/200 mL premix     1,000 mg200 mL/hr over 60 Minutes Intravenous Every T-Th-Sa (Hemodialysis) 04/25/14 1618     04/25/14 2300  metroNIDAZOLE (FLAGYL) tablet 500 mg     500 mg Oral Every 8 hours 04/25/14 2208     04/25/14 1700  vancomycin (VANCOCIN) 2,000 mg in sodium chloride 0.9 % 500 mL IVPB     2,000 mg250 mL/hr over 120 Minutes Intravenous  Once 04/25/14 1618 04/25/14 1934   04/25/14 1700  cefTAZidime (FORTAZ) 2 g in dextrose 5 % 50 mL IVPB     2 g100 mL/hr over 30 Minutes Intravenous  Once 04/25/14 1618 04/25/14 1701   04/24/14 1200  vancomycin (VANCOCIN) IVPB 1000 mg/200 mL premix  Status:  Discontinued     1,000 mg200 mL/hr over 60 Minutes Intravenous Every T-Th-Sa (Hemodialysis) 04/21/14 2200 04/22/14 0855   04/22/14 2200  cefTRIAXone (ROCEPHIN) 1 g in dextrose 5 % 50 mL IVPB  Status:  Discontinued     1 g100 mL/hr over 30 Minutes Intravenous Every 24 hours 04/21/14 2211 04/22/14 0855   04/22/14 0645  vancomycin (VANCOCIN) 1,750 mg in sodium chloride 0.9 % 500 mL IVPB  Status:  Discontinued     1,750 mg250 mL/hr over 120 Minutes Intravenous  Once 04/22/14 0634 04/22/14 0855   04/21/14 2200  cefTRIAXone (ROCEPHIN) 2 g in dextrose 5 % 50 mL IVPB  Status:  Discontinued     2 g100 mL/hr over 30 Minutes Intravenous Every 24 hours 04/21/14 2157 04/22/14 0855      Medications: Scheduled Meds: . antiseptic oral rinse  7 mL Mouth Rinse BID  . aspirin  81 mg Oral Daily  . cefTAZidime (FORTAZ)  IV  2 g Intravenous Q T,Th,S,Su-1800  . clonazePAM  0.5 mg Oral QHS  . feeding supplement (NEPRO CARB STEADY)  237 mL Oral BID  BM  . heparin subcutaneous  5,000 Units Subcutaneous 3 times per day  . insulin aspart  0-5 Units Subcutaneous QHS  . insulin aspart  0-9 Units Subcutaneous TID WC  . insulin detemir  35 Units Subcutaneous QHS  . lanthanum  1,000 mg Oral TID WC  . linagliptin  5 mg Oral Daily  . metroNIDAZOLE  500 mg Oral Q8H  . multivitamin  1 tablet Oral QHS  . mupirocin cream   Topical BID  . pantoprazole  40 mg Oral Daily  . sodium chloride  3 mL Intravenous Q12H  . sodium chloride  3 mL Intravenous Q12H  . vancomycin  1,000 mg Intravenous Q T,Th,Sa-HD   Continuous Infusions:  PRN Meds:.sodium chloride, sodium chloride, sodium chloride, sodium chloride, acetaminophen **OR** [DISCONTINUED] acetaminophen, acetaminophen, alum & mag hydroxide-simeth, feeding supplement (NEPRO CARB STEADY), heparin, HYDROmorphone (DILAUDID) injection, lanthanum, lidocaine (PF), lidocaine-prilocaine, morphine injection, ondansetron (ZOFRAN) IV, ondansetron, oxyCODONE, oxyCODONE-acetaminophen, pentafluoroprop-tetrafluoroeth, sodium chloride, sodium chloride    Objective: Weight change:   Intake/Output Summary (Last 24 hours) at 04/27/14 1539 Last data filed at 04/27/14 0745  Gross per 24 hour  Intake    240 ml  Output  2100 ml  Net  -1860 ml   Blood pressure 145/52, pulse 87, temperature 97.7 F (36.5 C), temperature source Oral, resp. rate 20, height 5\' 1"  (1.549 m), weight 189 lb 9.5 oz (86 kg), SpO2 91 %. Temp:  [97.4 F (36.3 C)-98.7 F (37.1 C)] 97.7 F (36.5 C) (01/15 1321) Pulse Rate:  [64-97] 87 (01/15 1321) Resp:  [12-32] 20 (01/15 1321) BP: (112-170)/(52-71) 145/52 mmHg (01/15 1321) SpO2:  [91 %-100 %] 91 % (01/15 1321)  Physical Exam: General: Alert and awake, oriented x3, not in any acute distress. Having echocardiogram performed CV: rrr Pulm: no wheezing  Neuro: able to move lower extremities vs gravity and resistance but 4/5 and  Extremities:  She had ointment applied today  See pictures  below:  Left foot:   04/23/14          Left foot today 02/24/15:        Foot wrapped today could not see toes     CBC:  CBC Latest Ref Rng 04/27/2014 04/26/2014 04/24/2014  WBC 4.0 - 10.5 K/uL 9.0 11.8(H) 9.3  Hemoglobin 12.0 - 15.0 g/dL 10.9(L) 10.7(L) 10.5(L)  Hematocrit 36.0 - 46.0 % 35.2(L) 33.2(L) 33.9(L)  Platelets 150 - 400 K/uL 104(L) 208 269     BMET  Recent Labs  04/26/14 0828 04/27/14 0844  NA 139 138  K 5.3* 4.9  CL 92* 97  CO2 25 21  GLUCOSE 109* 247*  BUN 28* 12  CREATININE 6.21* 3.96*  CALCIUM 10.1 8.8     Liver Panel  No results for input(s): PROT, ALBUMIN, AST, ALT, ALKPHOS, BILITOT, BILIDIR, IBILI in the last 72 hours.     Sedimentation Rate No results for input(s): ESRSEDRATE in the last 72 hours. C-Reactive Protein No results for input(s): CRP in the last 72 hours.  Micro Results: Recent Results (from the past 720 hour(s))  Blood culture (routine x 2)     Status: None (Preliminary result)   Collection Time: 04/21/14  8:36 PM  Result Value Ref Range Status   Specimen Description BLOOD LEFT HAND  Final   Special Requests BOTTLES DRAWN AEROBIC AND ANAEROBIC 3CC  Final   Culture   Final           BLOOD CULTURE RECEIVED NO GROWTH TO DATE CULTURE WILL BE HELD FOR 5 DAYS BEFORE ISSUING A FINAL NEGATIVE REPORT Performed at [facility]    Report Status PENDING  Incomplete  Blood culture (routine x 2)     Status: None (Preliminary result)   Collection Time: 04/21/14  9:37 PM  Result Value Ref Range Status   Specimen Description BLOOD LEFT ARM  Final   Special Requests BOTTLES DRAWN AEROBIC ONLY 3CC  Final   Culture   Final           BLOOD CULTURE RECEIVED NO GROWTH TO DATE CULTURE WILL BE HELD FOR 5 DAYS BEFORE ISSUING A FINAL NEGATIVE REPORT Performed at [facility]    Report Status PENDING  Incomplete  Culture, routine-abscess     Status: None (Preliminary result)   Collection Time: 04/25/14 12:52 PM    Result Value Ref Range Status   Specimen Description ABSCESS  Final   Special Requests ABSC T10 T11 DISC  Final   Gram Stain   Final    FEW WBC PRESENT,BOTH PMN AND MONONUCLEAR NO SQUAMOUS EPITHELIAL CELLS SEEN NO ORGANISMS SEEN Performed at [facility]    Culture   Final    MULTIPLE ORGANISMS  PRESENT, NONE PREDOMINANT Performed at [facility]    Report Status PENDING  Incomplete    Studies/Results: No results found.    Assessment/Plan:  Principal Problem:   Discitis of lumbar region Active Problems:   Hyponatremia   Diabetes mellitus   HTN (hypertension)   ESRD on dialysis   Diskitis   Anemia of renal disease   Elevated troponin-(0.27)   Bradycardia-transient, resolved   Diabetic foot ulcer   Diabetic infection of left foot   Necrotic toes    [name] is a 54 y.o. 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 She had a prior infected right chest hemodialysis catheter in September. I called her dialysis center on [facility] over [facility] and indeed on 12/14/2013 she had grown methicillin S  coagulase-negative staphylococcal species out of 2 different blood culture sites. She had been started on vancomycin and had cleared her blood cultures she had the catheter removed and there is a catheter tip culture from inpatient [facility] here that is negative. She completed at least a six-week course of antibiotics. She had actually also again been on antibiotics in the form of vancomycin for left toe infection. Her toe has not shown evidence of osteomyelitis on plain films though more aggressive imaging has not yet been performed. MRI also was without evidence of osteomyelitis. However she has some ischemic changes there and they seem to have worsened with severe pain today after having had hemodialysis  #1 Ischemic changes 4th and 5th digit: I consulted Dr. Hart [place]  And pt has also had ANgiogram with  stenting of superficial femoral artery.   Dr. Lajoyce Corners evaluating for need for amputation  Continue vanco/ceftaz and flagyl  #2 Discitis: aspirate with mixed bacteria, I am asking micro IF they can work any of these species up for ID and sensis  continue vancomycin and ceftazidime,   #3 HIV negative, hep C negative.     LOS: 6 days   Acey Lav 04/27/2014, 3:39 PM

## 2014-04-27 NOTE — Progress Notes (Signed)
Patient ID: [name], female   DOB: 01/07/1961, 54 y.o.   MRN: [id number] Subjective:  The patient is alert and pleasant. She is in no apparent distress.  Objective: Vital signs in last 24 hours: Temp:  [97.4 F (36.3 C)-98.7 F (37.1 C)] 97.4 F (36.3 C) (01/15 0438) Pulse Rate:  [61-97] 97 (01/15 0438) Resp:  [0-32] 30 (01/15 0438) BP: (112-187)/(53-79) 119/71 mmHg (01/15 0438) SpO2:  [91 %-100 %] 94 % (01/15 0438)  Intake/Output from previous day: 01/14 0701 - 01/15 0700 In: 240 [P.O.:240] Out: 2100  Intake/Output this shift:    Physical exam the patient is alert and oriented 3. She is moving her lower extremities well with only giveaway weakness.  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  EXAM: IR FLUORO GUIDE NEEDLE PLACEMENT /BIOPSY  ANESTHESIA/SEDATION: Conscious sedation.  MEDICATIONS: Versed 1 mg IV.  Fentanyl 50 mcg IV.  CONTRAST:  None.  PROCEDURE: Following a full explanation of the procedure along with the potential associated complications, an informed witnessed consent was obtained.  The patient was placed prone of early fluoroscopic table.  The skin overlying the thoracic region was then prepped and draped in the usual sterile fashion. The skin entry site overlying the right paramedian T10-T11 disc space was then infiltrated with 0.25% bupivacaine.  Using biplane intermittent fluoroscopy, a 22 gauge Franseen needle was then advanced into the T10-11 disk space without difficulty.  Using a 20 mL syringe, aspirate was obtained for analysis. A second pass was then made in a similar fashion with a new sterile needle. Using a 20  mL syringe, bloody aspirate was obtained and sent for microbiologic analysis.  A total of 2 mL of blood stained fluid was obtained.  Hemostasis was achieved at the skin entry site. The patient tolerated the procedure well.  COMPLICATIONS: None immediate.  FINDINGS: Status post fluoroscopic guided disc aspiration at T10-T11 for discitis.  IMPRESSION: As above.   Electronically Signed   By: Julieanne CottonSanjeev  Deveshwar M.D.   On: 04/26/2014 10:01    Assessment/Plan: T10-11 discitis: I would recommend continued IV antibiotics. It doesn't look like she will need surgery. Furthermore, she is not a very good surgical candidate.  LOS: 6 days     Teri Legacy D 04/27/2014, 7:58 AM

## 2014-04-27 NOTE — Progress Notes (Signed)
MD paged per pt request for plan, would like diet order. Darrel HooverWilson,Ronell Boldin S 12:56 PM

## 2014-04-27 NOTE — Clinical Social Work Psychosocial (Signed)
Clinical Social Work Department BRIEF PSYCHOSOCIAL ASSESSMENT 04/27/2014  Patient:  [name],[name]     Account Number:  [id number]     Admit date:  04/21/2014  Clinical Social Worker:  Merlyn LotHOLOMAN,Numair Masden, CLINICAL SOCIAL WORKER  Date/Time:  04/27/2014 03:30 PM  Referred by:  Physician  Date Referred:  04/27/2014 Referred for  SNF Placement   Other Referral:   Interview type:  Patient Other interview type:    PSYCHOSOCIAL DATA Living Status:  ALONE Admitted from facility:   Level of care:   Primary support name:  Debbie Primary support relationship to patient:  SIBLING Degree of support available:   High level of support from sister who patient would like to make decisions if she is not capable    CURRENT CONCERNS Current Concerns  Post-Acute Placement   Other Concerns:    SOCIAL WORK ASSESSMENT / PLAN CSW spoke to patinet concerning PT recommendation for SNF. Patient is agreeable to rehab and is hopeful that PT will help her to save her toes from being amputed.  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 CSW will continue to follow.   Assessment/plan status:  Psychosocial Support/Ongoing Assessment of Needs Other assessment/ plan:   FL2   Information/referral to community resources:    PATIENT'S/FAMILY'S RESPONSE TO PLAN OF CARE: Patient is agreeable to SNF and is optimistic that the medical interventions at the hospital will help her to keep her foot.       Merlyn LotJenna Holoman, LCSWA Clinical Social Worker [phone]

## 2014-04-27 NOTE — Progress Notes (Signed)
ANTIBIOTIC CONSULT NOTE   Pharmacy Consult for vancomycin and ceftazidime Indication: discitis and diabetic infection of foot  Allergies  Allergen Reactions  . Codeine Other (See Comments)    hallucinations   Patient Measurements: Height: 5\' 1"  (154.9 cm) Weight:  (on a stretcher with straight leg time, not able to weigh) IBW/kg (Calculated) : 47.8  Vital Signs: Temp: 97.4 F (36.3 C) (01/15 0438) Temp Source: Oral (01/15 0438) BP: 119/71 mmHg (01/15 0438) Pulse Rate: 97 (01/15 0438)  Labs:  Recent Labs  04/26/14 0828  WBC 11.8*  HGB 10.7*  PLT 208  CREATININE 6.21*   Estimated Creatinine Clearance: 10.3 mL/min (by C-G formula based on Cr of 6.21).  Microbiology: Recent Results (from the past 720 hour(s))  Blood culture (routine x 2)     Status: None (Preliminary result)   Collection Time: 04/21/14  8:36 PM  Result Value Ref Range Status   Specimen Description BLOOD LEFT HAND  Final   Special Requests BOTTLES DRAWN AEROBIC AND ANAEROBIC 3CC  Final   Culture   Final           BLOOD CULTURE RECEIVED NO GROWTH TO DATE CULTURE WILL BE HELD FOR 5 DAYS BEFORE ISSUING A FINAL NEGATIVE REPORT Performed at [facility]    Report Status PENDING  Incomplete  Blood culture (routine x 2)     Status: None (Preliminary result)   Collection Time: 04/21/14  9:37 PM  Result Value Ref Range Status   Specimen Description BLOOD LEFT ARM  Final   Special Requests BOTTLES DRAWN AEROBIC ONLY 3CC  Final   Culture   Final           BLOOD CULTURE RECEIVED NO GROWTH TO DATE CULTURE WILL BE HELD FOR 5 DAYS BEFORE ISSUING A FINAL NEGATIVE REPORT Performed at [facility]    Report Status PENDING  Incomplete  Culture, routine-abscess     Status: None (Preliminary result)   Collection Time: 04/25/14 12:52 PM  Result Value Ref Range Status   Specimen Description ABSCESS  Final   Special Requests ABSC T10 T11 DISC  Final   Gram Stain   Final    FEW WBC PRESENT,BOTH PMN AND  MONONUCLEAR NO SQUAMOUS EPITHELIAL CELLS SEEN NO ORGANISMS SEEN Performed at [facility]    Culture   Final    MULTIPLE ORGANISMS PRESENT, NONE PREDOMINANT Performed at [facility]    Report Status PENDING  Incomplete   Assessment: 54 yo F admitted with c/o back pain, weakness and a fall. 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 She is now s/p aspiration of T10-T11 disc. She did receive ceftriaxone 2g IV x1 on 1/9. Vancomycin was ordered but never given on 1/10.  Currently on day #2 of vancomycin, fortaz, and flagyl. S/p angiography and stenting of left superficial femoral artery yesterday. No fevers overnight and wbc 11.8.   1/13 disc abscess cx - ngtd 1/09 bld x2 - ng  Goal of Therapy:  Pre-HD vancomycin level 15-25 mcg/ml  Plan:  -Vancomycin 1g qhd - TTS, consider trough next week at steady state -ceftazidime 2g IV qHD -f/u any growth from cultures -f/u HD schedule and any changes  Sheppard CoilFrank Laterica Matarazzo PharmD., BCPS Clinical Pharmacist Pager [phone] 04/27/2014 8:42 AM

## 2014-04-27 NOTE — Progress Notes (Signed)
Subjective:   Had L SFA stenting yesterday , has popliteal occlusion and distal disease as well. Left leg is much less painful and tender (calf, foot ) today   Objective Filed Vitals:   04/26/14 2000 04/26/14 2015 04/26/14 2144 04/27/14 0438  BP: 121/68 136/56 170/58 119/71  Pulse: 80 81 71 97  Temp: 98.4 F (36.9 C)  98.7 F (37.1 C) 97.4 F (36.3 C)  TempSrc: Oral  Oral Oral  Resp: 22 20 32 30  Height:      Weight:      SpO2:   100% 94%   Physical Exam General: awake and alert. No acute distress.  Heart: RRR Lungs: CTA, unlabored.  Abdomen: soft nontender +BS Extremities: L toe with dressing intact. L foot nonpitting edema.  Dialysis Access: L IJ cath and R AVF Neuro: alert, nf, Ox 3  HD: [facility] 85 kgs 2K/2ca 4 hr 400/800 6200 Heparin. L IJ cath and R AVF Calcitriol 0.75 Aranesp 25 q week.  Assessment/Plan: 1. T10-11 discitis - s/p aspiration 2. Falls - multifactorial, ischemic L leg , discitis 3. Hx MRSA cath sepsis Aug 2015 4. Left foot ischemia - per ortho/ vasc/ ID 5. L calf pain/ tenderness - resolved after SFA stenting 6. ESRD on HD TTS 7. DM on insulin/ gliptin A1c 9.5 8. HTN/volume - 132/58 , at dry wt 9. Anemia Hb  10-11, holding Audree Banearanesp   Rob Ainsleigh Kakos MD pager [phone]    cell [phone] 04/27/2014, 9:52 AM    Additional Objective Labs: Basic Metabolic Panel:  Recent Labs Lab 04/23/14 0432 04/24/14 0500 04/26/14 0828 04/27/14 0844  NA 133* 130* 139 138  K 4.1 4.8 5.3* 4.9  CL 92* 91* 92* 97  CO2 25 24 25 21   GLUCOSE 48* 312* 109* 247*  BUN 45* 31* 28* 12  CREATININE 8.29* 7.12* 6.21* 3.96*  CALCIUM 9.7 8.8 10.1 8.8  PHOS 7.1*  --   --   --    Liver Function Tests:  Recent Labs Lab 04/21/14 1455 04/23/14 0432 04/24/14 0500  AST 28  --  60*  ALT 6  --  9  ALKPHOS 106  --  118*  BILITOT 1.4*  --  1.0  PROT 6.9  --  6.5  ALBUMIN 3.1* 2.9* 2.9*   No results for input(s): LIPASE, AMYLASE in the last 168  hours. CBC:  Recent Labs Lab 04/21/14 1455 04/22/14 0505 04/23/14 0432 04/24/14 0500 04/26/14 0828 04/27/14 0844  WBC 10.7* 10.1 10.2 9.3 11.8* 9.0  NEUTROABS 9.5*  --   --   --   --   --   HGB 10.6* 10.3* 10.4* 10.5* 10.7* 10.9*  HCT 33.1* 33.2* 33.0* 33.9* 33.2* 35.2*  MCV 86.0 86.5 86.8 89.0 88.5 91.2  PLT 315 342 294 269 208 PENDING   Blood Culture    Component Value Date/Time   SDES ABSCESS 04/25/2014 1252   SPECREQUEST ABSC T10 T11 DISC 04/25/2014 1252   CULT  04/25/2014 1252    MULTIPLE ORGANISMS PRESENT, NONE PREDOMINANT Performed at [facility]    REPTSTATUS PENDING 04/25/2014 1252    Cardiac Enzymes:  Recent Labs Lab 04/22/14 1215 04/23/14 0432  TROPONINI 0.08* 0.27*   CBG:  Recent Labs Lab 04/26/14 1324 04/26/14 1359 04/26/14 1431 04/26/14 2249 04/27/14 0538  GLUCAP 66* 56* 75 71 210*   Iron Studies: No results for input(s): IRON, TIBC, TRANSFERRIN, FERRITIN in the last 72 hours. 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  EXAM: IR FLUORO GUIDE NEEDLE PLACEMENT /BIOPSY  ANESTHESIA/SEDATION: Conscious sedation.  MEDICATIONS: Versed 1 mg IV.  Fentanyl 50 mcg IV.  CONTRAST:  None.  PROCEDURE: Following a full explanation of the procedure along with the potential associated complications, an informed witnessed consent was obtained.  The patient was placed prone of early fluoroscopic table.  The skin overlying the thoracic region was then prepped and draped in the usual sterile fashion. The skin entry site overlying the right paramedian T10-T11 disc space was then infiltrated with 0.25% bupivacaine.  Using biplane intermittent fluoroscopy, a 22 gauge Franseen needle was then advanced into the T10-11 disk space without difficulty.  Using a 20 mL syringe, aspirate was obtained for analysis. A second pass was then made in a  similar fashion with a new sterile needle. Using a 20 mL syringe, bloody aspirate was obtained and sent for microbiologic analysis.  A total of 2 mL of blood stained fluid was obtained.  Hemostasis was achieved at the skin entry site. The patient tolerated the procedure well.  COMPLICATIONS: None immediate.  FINDINGS: Status post fluoroscopic guided disc aspiration at T10-T11 for discitis.  IMPRESSION: As above.   Electronically Signed   By: Julieanne Cotton M.D.   On: 04/26/2014 10:01   Medications:   . cefTAZidime (FORTAZ)  IV  2 g Intravenous Q T,Th,S,Su-1800  . clonazePAM  0.5 mg Oral QHS  . feeding supplement (NEPRO CARB STEADY)  237 mL Oral BID BM  . heparin subcutaneous  5,000 Units Subcutaneous 3 times per day  . insulin aspart  0-5 Units Subcutaneous QHS  . insulin aspart  0-9 Units Subcutaneous TID WC  . insulin detemir  35 Units Subcutaneous QHS  . lanthanum  1,000 mg Oral TID WC  . linagliptin  5 mg Oral Daily  . metroNIDAZOLE  500 mg Oral Q8H  . multivitamin  1 tablet Oral QHS  . mupirocin cream   Topical BID  . pantoprazole  40 mg Oral Daily  . sodium chloride  3 mL Intravenous Q12H  . sodium chloride  3 mL Intravenous Q12H  . vancomycin  1,000 mg Intravenous Q T,Th,Sa-HD

## 2014-04-27 NOTE — Progress Notes (Signed)
Physical Therapy Treatment Patient Details Name: [name] MRN: [id number] DOB: 01/28/1961 Today's Date: 04/27/2014    History of Present Illness Patient is a 54 y/o female with PMH of history of ESRD on HD,  HTN, IDDM, Lumbar DDD who presents to the ED with complaints of progressive weakness over the past month in both legs with knee buckling precipitating a fall today (1/9). MRI of lumbar spine evidence of discitis/osteomyelitis of T10-T11 disc. Pt with injury to her Left 5th Toe 1 month ago. IR- aspiration today 1/13.Left SFA stent as well.      PT Comments    Pt admitted with above diagnosis. Pt currently with functional limitations due to balance and endurance deficits as well as weakness.  Pt still needs NHP with therapy to achieve maximal functional level.  Pt agrees.  Pt will benefit from skilled PT to increase their independence and safety with mobility to allow discharge to the venue listed below.    Follow Up Recommendations  SNF;Supervision/Assistance - 24 hour     Equipment Recommendations  None recommended by PT    Recommendations for Other Services OT consult     Precautions / Restrictions Precautions Precautions: Fall Restrictions Weight Bearing Restrictions: No    Mobility  Bed Mobility Overal bed mobility: Needs Assistance Bed Mobility: Rolling;Sidelying to Sit;Sit to Sidelying Rolling: Min guard Sidelying to sit: Min guard;HOB elevated     Sit to sidelying: Min guard General bed mobility comments: Min guard for safety. Cues for log roll technique. Increased time. Pt constantly needing redirection to participate in mobility.Pt has her own way of doing things and does better if she directs movement.    Transfers Overall transfer level: Needs assistance Equipment used: Rolling walker (2 wheeled) Transfers: Sit to/from Stand Sit to Stand: Mod assist;+2 physical assistance         General transfer comment: Mod A to stand from EOB with increased  effort and time. Cues for hand placement. Pt with partial knee buckling during standing but no LOB.  Ambulation/Gait             General Gait Details: unable   Stairs            Wheelchair Mobility    Modified Rankin (Stroke Patients Only)       Balance Overall balance assessment: Needs assistance;History of Falls Sitting-balance support: Feet supported;No upper extremity supported Sitting balance-Leahy Scale: Good Sitting balance - Comments: can reach outside BOS.    Standing balance support: Bilateral upper extremity supported;During functional activity Standing balance-Leahy Scale: Poor Standing balance comment: Requires BUE support and use of RW for support.  Stood 2 minutes statically with rW with min assist to steady while up.  then stood 1 minute with RW with same assist.  Knees weak and pt with uncontrolled descent once fatigues back to bed.                      Cognition Arousal/Alertness: Awake/alert Behavior During Therapy: WFL for tasks assessed/performed Overall Cognitive Status: Within Functional Limits for tasks assessed                      Exercises General Exercises - Lower Extremity Ankle Circles/Pumps: AROM;Both;5 reps;Supine Heel Slides: AROM;Both;5 reps;Supine    General Comments        Pertinent Vitals/Pain Pain Assessment: Faces Faces Pain Scale: Hurts little more Pain Location: left foot Pain Descriptors / Indicators: Sore Pain Intervention(s): Limited activity within patient's  tolerance;Monitored during session;Premedicated before session;Repositioned    Home Living                      Prior Function            PT Goals (current goals can now be found in the care plan section) Progress towards PT goals: Progressing toward goals    Frequency  Min 2X/week    PT Plan Current plan remains appropriate    Co-evaluation             End of Session Equipment Utilized During Treatment: Gait  belt Activity Tolerance: Patient limited by fatigue Patient left: in bed;with call bell/phone within reach;with family/visitor present     Time: 1151-1229 PT Time Calculation (min) (ACUTE ONLY): 38 min  Charges:  $Therapeutic Exercise: 8-22 mins $Therapeutic Activity: 23-37 mins                    G Codes:      Armany Mano F 04/27/2014, 1:40 PM Danell Verno,PT Acute Rehabilitation [phone] (pager)

## 2014-04-27 NOTE — Progress Notes (Signed)
[facility] PROGRESS NOTE  [name] ZOX:[id number] DOB: 10/09/1960 DOA: 04/21/2014 PCP: Pearla DubonnetGATES,ROBERT NEVILL, MD  Summary  [name] is a 54 y.o. female with a history of ESRD on HD, HTN, IDDM, Lumbar DDD who presents to the ED with complaints of progressive weakness over the past month in both legs and her knee buckling and precipitating a fall today. She was not able to get up and was brought to the ED. She also had an injury to her Left 5th Toe 1 month ago and she saw her PCP, and over the month she developed increased redness so her dialysis doctor placed over on IV Vancomycin to treat a cellulitis with her dialysis Rx 4 days ago. In the ED tonight, An X-Ray of the Left Foot was performed and was negative for fracture and findings of Osteomyelitis, and an MRI of the Lumbar Spine which revealed +diskitis/Osteomyelitis of the T10-T11 disk. NeuroSurgery Dr. Tressie StalkerJeffrey Jenkins was consulted and patient was placed on IV Vancomycin and Rocephin and referred for medical admission.  She was here seen by infectious disease, cardiology, IR for CT guided aspiration of her discitis which was done on 04/25/2014, she also developed worsening pain in her left fourth and fifth toe which was suspicious for ischemic etiology, vascular surgery was consulted and she is due for arteriogram with a possible revascularization surgery later.   Assessment/Plan:  Discitis -MRI of lumbar spine evidence of discitis/osteomyelitis of T10-T11 disc -ID on board, rec's- IR guided aspirate 1/13, results pending, placed on IV Van and Ceftazimide per ID -Aspirate cultures pending  Gangrene toe -4th and 5th digit w/ ischemic changes and worsening pain -MRI shows no osteo,  Left Venous duplex- no evidence of DVT - Seen by Vascular L Saph Vein stenting by Vascular Dr Imogene Burnhen 04-26-13, not a bypass candidate, placed on ASA -Continue pain control when necessary -Continue IV Vancomycin and Ceftazimide (day  2)  Elevated troponin - Denies any chest pain -Troponin mildly elevated at 0.27 -Per Cards- no further workup planned  ESRD on dialysis -Nephrology on board -Receives HD T, R, Sat.  Last HD 1/12 -Scheduled for HD today, 1/14  Mobitz type I heart block/bradycardia -Remains asymptomatic -2D echo 04/2014- EF 55-60%, mild LVH, mild AS -Repeat EKG- NSR -Atenolol discontinued  Diabetes mellitus -CBGs controlled -Hemoglobin A1c 9.5 -Levemir dose adjusted  continue SSI,  and Tradjental 5mg  daily.     CBG (last 3)   Recent Labs  04/26/14 1431 04/26/14 2249 04/27/14 0538  GLUCAP 75 71 210*     Malignant hypertension -BP stable -Atenolol discontinued due to episode of bradycardia  Anemia of renal disease -Hgb 10.5 -Continue Reno-vit -Repeat CBC in a.m.  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 Right common femoral artery cannulation under ultrasound guidance 2. Placement of catheter in aorta 3. Aortogram 4. Left second order arterial selecgtion 5. Left leg runoff 6. Angioplasty and stenting of Left superficial femoral artery (5 mm x 30 mm self-expanding) 7.  Right leg runoff  Antibiotics:  Anti-infectives    Start     Dose/Rate Route Frequency Ordered Stop   04/26/14 1800  cefTAZidime (FORTAZ) 2 g in dextrose 5 % 50 mL IVPB     2 g100 mL/hr over 30 Minutes Intravenous Every T-Th-S-Su (1800) 04/25/14 1618     04/26/14 1200   vancomycin (VANCOCIN) IVPB 1000 mg/200 mL premix     1,000 mg200 mL/hr over 60 Minutes Intravenous Every T-Th-Sa (Hemodialysis) 04/25/14 1618     04/25/14 2300  metroNIDAZOLE (FLAGYL) tablet 500 mg     500 mg Oral Every 8 hours 04/25/14 2208     04/25/14 1700  vancomycin (VANCOCIN) 2,000 mg in sodium chloride 0.9 % 500 mL IVPB     2,000 mg250 mL/hr over 120 Minutes Intravenous  Once 04/25/14 1618 04/25/14 1934   04/25/14 1700  cefTAZidime (FORTAZ) 2 g in dextrose 5 % 50 mL IVPB     2 g100 mL/hr over 30 Minutes Intravenous  Once 04/25/14 1618 04/25/14 1701   04/24/14 1200  vancomycin (VANCOCIN) IVPB 1000 mg/200 mL premix  Status:  Discontinued     1,000 mg200 mL/hr over 60 Minutes Intravenous Every T-Th-Sa (Hemodialysis) 04/21/14 2200 04/22/14 0855   04/22/14 2200  cefTRIAXone (ROCEPHIN) 1 g in dextrose 5 % 50 mL IVPB  Status:  Discontinued     1 g100 mL/hr over 30 Minutes Intravenous Every 24 hours 04/21/14 2211 04/22/14 0855   04/22/14 0645  vancomycin (VANCOCIN) 1,750 mg in sodium chloride 0.9 % 500 mL IVPB  Status:  Discontinued     1,750 mg250 mL/hr over 120 Minutes Intravenous  Once 04/22/14 0634 04/22/14 0855   04/21/14 2200  cefTRIAXone (ROCEPHIN) 2 g in dextrose 5 % 50 mL IVPB  Status:  Discontinued     2 g100 mL/hr over 30 Minutes Intravenous Every 24 hours 04/21/14 2157 04/22/14 0855       Subjective:  Patient in bed, denies any chest abdominal pain, no shortness of breath, does have some pain in the left fourth and fifth toes. No focal weakness.       Objective: Filed Vitals:   04/27/14 0438  BP: 119/71  Pulse: 97  Temp: 97.4 F (36.3 C)  Resp: 30    Intake/Output Summary (Last 24 hours) at 04/27/14 0955 Last data filed at 04/27/14 0400  Gross per 24 hour  Intake    240 ml  Output   2100 ml  Net  -1860 ml   Filed Weights   04/23/14 1618 04/24/14 0611 04/24/14 1012  Weight: 86.6 kg (190 lb 14.7 oz) 86.6 kg (190 lb 14.7 oz) 86 kg (189 lb 9.5 oz)     Exam:  Gen: Alert Caucasian female in NAD. Chest: clear to auscultate bilaterally, no ronchi or rales  Cardiac: Regular rate and rhythm, holosystolic murmur  Abdomen: soft, non tender, non distended, +bowel sounds. No guarding or rigidity  Extremities: Symmetrical in appearance without edema.  Left 5th toe with cellulitic changes Neurological: Alert awake oriented to time place and person.  Psychiatric: Appears normal.   Data Reviewed: Basic Metabolic Panel:  Recent Labs Lab 04/22/14 0505 04/23/14 0432 04/24/14 0500 04/26/14 0828 04/27/14 0844  NA 130* 133* 130* 139 138  K 5.0 4.1 4.8 5.3* 4.9  CL 90* 92* 91* 92* 97  CO2 21 25 24 25 21   GLUCOSE 361* 48* 312* 109* 247*  BUN 40* 45* 31* 28* 12  CREATININE 7.23* 8.29* 7.12* 6.21* 3.96*  CALCIUM 9.8  9.7 8.8 10.1 8.8  PHOS  --  7.1*  --   --   --    Liver Function Tests:  Recent Labs Lab 04/21/14 1455 04/23/14 0432 04/24/14 0500  AST 28  --  60*  ALT 6  --  9  ALKPHOS 106  --  118*  BILITOT 1.4*  --  1.0  PROT 6.9  --  6.5  ALBUMIN 3.1* 2.9* 2.9*   No results for input(s): LIPASE, AMYLASE in the last 168 hours. No results for input(s): AMMONIA in the last 168 hours. CBC:  Recent Labs Lab 04/21/14 1455 04/22/14 0505 04/23/14 0432 04/24/14 0500 04/26/14 0828 04/27/14 0844  WBC 10.7* 10.1 10.2 9.3 11.8* 9.0  NEUTROABS 9.5*  --   --   --   --   --   HGB 10.6* 10.3* 10.4* 10.5* 10.7* 10.9*  HCT 33.1* 33.2* 33.0* 33.9* 33.2* 35.2*  MCV 86.0 86.5 86.8 89.0 88.5 91.2  PLT 315 342 294 269 208 PENDING   Cardiac Enzymes:  Recent Labs Lab 04/22/14 1215 04/23/14 0432  TROPONINI 0.08* 0.27*   BNP (last 3 results)  Recent Labs  07/10/13 1330  PROBNP 15005.0*   CBG:  Recent Labs Lab 04/26/14 1324 04/26/14 1359 04/26/14 1431 04/26/14 2249 04/27/14 0538  GLUCAP 66* 56* 75 71 210*    Recent Results (from the past 240 hour(s))  Blood culture (routine x 2)     Status: None (Preliminary result)    Collection Time: 04/21/14  8:36 PM  Result Value Ref Range Status   Specimen Description BLOOD LEFT HAND  Final   Special Requests BOTTLES DRAWN AEROBIC AND ANAEROBIC 3CC  Final   Culture   Final           BLOOD CULTURE RECEIVED NO GROWTH TO DATE CULTURE WILL BE HELD FOR 5 DAYS BEFORE ISSUING A FINAL NEGATIVE REPORT Performed at [facility]    Report Status PENDING  Incomplete  Blood culture (routine x 2)     Status: None (Preliminary result)   Collection Time: 04/21/14  9:37 PM  Result Value Ref Range Status   Specimen Description BLOOD LEFT ARM  Final   Special Requests BOTTLES DRAWN AEROBIC ONLY 3CC  Final   Culture   Final           BLOOD CULTURE RECEIVED NO GROWTH TO DATE CULTURE WILL BE HELD FOR 5 DAYS BEFORE ISSUING A FINAL NEGATIVE REPORT Performed at [facility]    Report Status PENDING  Incomplete  Culture, routine-abscess     Status: None (Preliminary result)   Collection Time: 04/25/14 12:52 PM  Result Value Ref Range Status   Specimen Description ABSCESS  Final   Special Requests ABSC T10 T11 DISC  Final   Gram Stain   Final    FEW WBC PRESENT,BOTH PMN AND MONONUCLEAR NO SQUAMOUS EPITHELIAL CELLS SEEN NO ORGANISMS SEEN Performed at [facility]    Culture   Final    MULTIPLE ORGANISMS PRESENT, NONE PREDOMINANT Performed at [facility]    Report Status PENDING  Incomplete     Studies: Ir Fluoro Guide Ndl Plmt / Bx  04/26/2014   CLINICAL DATA:  Severe low back pain secondary to 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  EXAM: IR FLUORO GUIDE NEEDLE PLACEMENT /BIOPSY  ANESTHESIA/SEDATION: Conscious sedation.  MEDICATIONS: Versed 1 mg IV.  Fentanyl 50 mcg IV.  CONTRAST:  None.  PROCEDURE: Following a full explanation of the procedure along with the  potential associated complications, an informed witnessed consent was obtained.  The patient was placed prone of early fluoroscopic table.  The skin overlying the thoracic region was  then prepped and draped in the usual sterile fashion. The skin entry site overlying the right paramedian T10-T11 disc space was then infiltrated with 0.25% bupivacaine.  Using biplane intermittent fluoroscopy, a 22 gauge Franseen needle was then advanced into the T10-11 disk space without difficulty.  Using a 20 mL syringe, aspirate was obtained for analysis. A second pass was then made in a similar fashion with a new sterile needle. Using a 20 mL syringe, bloody aspirate was obtained and sent for microbiologic analysis.  A total of 2 mL of blood stained fluid was obtained.  Hemostasis was achieved at the skin entry site. The patient tolerated the procedure well.  COMPLICATIONS: None immediate.  FINDINGS: Status post fluoroscopic guided disc aspiration at T10-T11 for discitis.  IMPRESSION: As above.   Electronically Signed   By: Julieanne Cotton M.D.   On: 04/26/2014 10:01    Scheduled Meds: . cefTAZidime (FORTAZ)  IV  2 g Intravenous Q T,Th,S,Su-1800  . clonazePAM  0.5 mg Oral QHS  . feeding supplement (NEPRO CARB STEADY)  237 mL Oral BID BM  . heparin subcutaneous  5,000 Units Subcutaneous 3 times per day  . insulin aspart  0-5 Units Subcutaneous QHS  . insulin aspart  0-9 Units Subcutaneous TID WC  . insulin detemir  35 Units Subcutaneous QHS  . lanthanum  1,000 mg Oral TID WC  . linagliptin  5 mg Oral Daily  . metroNIDAZOLE  500 mg Oral Q8H  . multivitamin  1 tablet Oral QHS  . mupirocin cream   Topical BID  . pantoprazole  40 mg Oral Daily  . sodium chloride  3 mL Intravenous Q12H  . sodium chloride  3 mL Intravenous Q12H  . vancomycin  1,000 mg Intravenous Q T,Th,Sa-HD   Continuous Infusions:   Principal Problem:   Discitis of lumbar region Active Problems:   Hyponatremia   Diabetes mellitus   HTN (hypertension)   ESRD on dialysis   Diskitis   Anemia of renal disease   Elevated troponin-(0.27)   Bradycardia-transient, resolved   Diabetic foot ulcer   Diabetic infection of  left foot   Necrotic toes    Time spent: 74    Jovani Colquhoun K M.D on 04/27/2014 at 9:55 AM  Between 7am to 7pm - Pager - [phone], After 7pm go to [email or web address] - password [email or web address]  And look for the night coverage person covering me after hours  [facility]  Office  [phone]

## 2014-04-27 NOTE — Clinical Social Work Note (Signed)
Patient agreeable to SNF placement- bed search initiated.  Full assessment to follow.  Merlyn LotJenna Holoman, LCSWA Clinical Social Worker [phone]

## 2014-04-27 NOTE — Clinical Social Work Placement (Addendum)
Clinical Social Work Department CLINICAL SOCIAL WORK PLACEMENT NOTE 04/27/2014  Patient:  [name],[name]  Account Number:  [id number] Admit date:  04/21/2014  Clinical Social Worker:  Merlyn LotJENNA HOLOMAN, CLINICAL SOCIAL WORKER  Date/time:  04/27/2014 05:45 PM  Clinical Social Work is seeking post-discharge placement for this patient at the following level of care:   SKILLED NURSING   (*CSW will update this form in Epic as items are completed)   04/27/2014  [facility]/family provided with [facility] of Clinical Social Work's list of facilities offering this level of care within the geographic area requested by the patient (or if unable, by the patient's family).  04/27/2014  Patient/family informed of their freedom to choose among providers that offer the needed level of care, that participate in Medicare, Medicaid or managed care program needed by the patient, have an available bed and are willing to accept the patient.  04/27/2014  Patient/family informed of [facility]' ownership interest in [facility], as well as of the fact that they are under no obligation to receive care at this facility.  PASARR submitted to EDS on 04/27/2014 PASARR number received on 04/27/2014  FL2 transmitted to all facilities in geographic area requested by pt/family on  04/27/2014 FL2 transmitted to all facilities within larger geographic area on   Patient informed that his/her managed care company has contracts with or will negotiate with  certain facilities, including the following:     Patient/family informed of bed offers received:  05/08/2014 [facility] Physician recommends and patient chooses bed at  n/a  [facility]  05/08/2014 Patient to be transferred to facility by [facility] Patient and family notified of transfer on patient is alert and oriented Name of family member notified:  N/a Patient is  LOG per Zack.  The following physician request were entered in Epic:   Additional Comments: Merlyn LotJenna Holoman, [facility]WA Clinical Social Worker [phone]

## 2014-04-27 NOTE — Progress Notes (Signed)
MD paged per pt request for foot order for lab draws. [name],[name] S 8:35 AM

## 2014-04-28 LAB — CULTURE, BLOOD (ROUTINE X 2)
CULTURE: NO GROWTH
Culture: NO GROWTH

## 2014-04-28 LAB — RENAL FUNCTION PANEL
ALBUMIN: 2.7 g/dL — AB (ref 3.5–5.2)
Anion gap: 11 (ref 5–15)
BUN: 23 mg/dL (ref 6–23)
CO2: 28 mmol/L (ref 19–32)
Calcium: 9.2 mg/dL (ref 8.4–10.5)
Chloride: 95 mEq/L — ABNORMAL LOW (ref 96–112)
Creatinine, Ser: 5.28 mg/dL — ABNORMAL HIGH (ref 0.50–1.10)
GFR calc Af Amer: 10 mL/min — ABNORMAL LOW (ref >90)
GFR calc non Af Amer: 8 mL/min — ABNORMAL LOW (ref >90)
Glucose, Bld: 151 mg/dL — ABNORMAL HIGH (ref 70–99)
POTASSIUM: 4 mmol/L (ref 3.5–5.1)
Phosphorus: 4.9 mg/dL — ABNORMAL HIGH (ref 2.3–4.6)
SODIUM: 134 mmol/L — AB (ref 135–145)

## 2014-04-28 LAB — CBC
HEMATOCRIT: 31 % — AB (ref 36.0–46.0)
Hemoglobin: 9.7 g/dL — ABNORMAL LOW (ref 12.0–15.0)
MCH: 28.3 pg (ref 26.0–34.0)
MCHC: 31.3 g/dL (ref 30.0–36.0)
MCV: 90.4 fL (ref 78.0–100.0)
Platelets: 157 10*3/uL (ref 150–400)
RBC: 3.43 MIL/uL — AB (ref 3.87–5.11)
RDW: 20.1 % — AB (ref 11.5–15.5)
WBC: 8.4 10*3/uL (ref 4.0–10.5)

## 2014-04-28 LAB — GLUCOSE, CAPILLARY
GLUCOSE-CAPILLARY: 158 mg/dL — AB (ref 70–99)
GLUCOSE-CAPILLARY: 78 mg/dL (ref 70–99)
GLUCOSE-CAPILLARY: 85 mg/dL (ref 70–99)
Glucose-Capillary: 107 mg/dL — ABNORMAL HIGH (ref 70–99)

## 2014-04-28 MED ORDER — PENTAFLUOROPROP-TETRAFLUOROETH EX AERO
INHALATION_SPRAY | CUTANEOUS | Status: AC
  Filled 2014-04-28: qty 103.5

## 2014-04-28 MED ORDER — OXYCODONE-ACETAMINOPHEN 5-325 MG PO TABS
ORAL_TABLET | ORAL | Status: AC
  Filled 2014-04-28: qty 2

## 2014-04-28 NOTE — Procedures (Signed)
I was present at this dialysis session, have reviewed the session itself and made  appropriate changes  Vinson Moselleob Kailash Hinze MD (pgr) [phone]    [phone] 04/28/2014, 11:00 AM

## 2014-04-28 NOTE — Progress Notes (Signed)
[facility] PROGRESS NOTE  [name] WUJ:[id number] DOB: 1961/01/22 DOA: 04/21/2014 PCP: Pearla Dubonnet, MD  Summary  [name] is a 54 y.o. female with a history of ESRD on HD, HTN, IDDM, Lumbar DDD who presents to the ED with complaints of progressive weakness over the past month in both legs and her knee buckling and precipitating a fall today. She was not able to get up and was brought to the ED. She also had an injury to her Left 5th Toe 1 month ago and she saw her PCP, and over the month she developed increased redness so her dialysis doctor placed over on IV Vancomycin to treat a cellulitis with her dialysis Rx 4 days ago. In the ED tonight, An X-Ray of the Left Foot was performed and was negative for fracture and findings of Osteomyelitis, and an MRI of the Lumbar Spine which revealed +diskitis/Osteomyelitis of the T10-T11 disk. NeuroSurgery Dr. Tressie Stalker was consulted and patient was placed on IV Vancomycin and Rocephin and referred for medical admission.  She was here seen by infectious disease, cardiology, IR for CT guided aspiration of her discitis which was done on 04/25/2014, she also developed worsening pain in her left fourth and fifth toe which was suspicious for ischemic etiology, vascular surgery was consulted and she is due for arteriogram with a possible revascularization surgery later.   Assessment/Plan:  Discitis -MRI of lumbar spine evidence of discitis/osteomyelitis of T10-T11 disc -ID on board, rec's- IR guided aspirate 1/13, results pending, placed on IV Van and Ceftazimide per ID - Follow Aspirate cultures   Gangrene toe -4th and 5th digit w/ ischemic changes and worsening pain -MRI shows no osteo,  Left Venous duplex- no evidence of DVT - Seen by Vascular L Saph Vein stenting by Vascular Dr Imogene Burn 04-26-13, not a bypass candidate, placed on ASA -Continue pain control when necessary -Continue IV Vancomycin and Ceftazimide (day  2)  Elevated troponin - Denies any chest pain -Troponin mildly elevated at 0.27 -Per Cards- no further workup planned  ESRD on dialysis -Nephrology on board -Receives HD T, R, Sat.  Last HD 1/12 -Scheduled for HD today, 1/14  Mobitz type I heart block/bradycardia -Remains asymptomatic -2D echo 04/2014- EF 55-60%, mild LVH, mild AS -Repeat EKG- NSR -Atenolol discontinued  Diabetes mellitus -CBGs controlled -Hemoglobin A1c 9.5 -Levemir dose adjusted  continue SSI,  and Tradjental  daily.     CBG (last 3)   Recent Labs  04/27/14 1616 04/27/14 2156 04/28/14 0612  GLUCAP 282* 310* 158*     Malignant hypertension -BP stable -Atenolol discontinued due to episode of bradycardia  Anemia of renal disease -Hgb 10.5 -Continue [place]-vit -Repeat CBC in a.m.  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 Right common femoral artery cannulation under ultrasound guidance 2. Placement of catheter in aorta 3. Aortogram 4. Left second order arterial selecgtion 5. Left leg runoff 6. Angioplasty and stenting of Left superficial femoral artery (5 mm x 30 mm self-expanding)  7. Right leg runoff  Antibiotics:  Anti-infectives    Start     Dose/Rate Route Frequency Ordered Stop   04/26/14 1800  cefTAZidime (FORTAZ) 2 g in dextrose 5 % 50 mL IVPB     2 g100 mL/hr over 30 Minutes Intravenous Every T-Th-S-Su (1800) 04/25/14 1618     04/26/14 1200  vancomycin (VANCOCIN) IVPB 1000 mg/200  mL premix     1,000 mg200 mL/hr over 60 Minutes Intravenous Every T-Th-Sa (Hemodialysis) 04/25/14 1618     04/25/14 2300  metroNIDAZOLE (FLAGYL) tablet 500 mg     500 mg Oral Every 8 hours 04/25/14 2208     04/25/14 1700  vancomycin (VANCOCIN) 2,000 mg in sodium chloride 0.9 % 500 mL IVPB     2,000 mg250 mL/hr over 120 Minutes Intravenous  Once 04/25/14 1618 04/25/14 1934   04/25/14 1700  cefTAZidime (FORTAZ) 2 g in dextrose 5 % 50 mL IVPB     2 g100 mL/hr over 30 Minutes Intravenous  Once 04/25/14 1618 04/25/14 1701   04/24/14 1200  vancomycin (VANCOCIN) IVPB 1000 mg/200 mL premix  Status:  Discontinued     1,000 mg200 mL/hr over 60 Minutes Intravenous Every T-Th-Sa (Hemodialysis) 04/21/14 2200 04/22/14 0855   04/22/14 2200  cefTRIAXone (ROCEPHIN) 1 g in dextrose 5 % 50 mL IVPB  Status:  Discontinued     1 g100 mL/hr over 30 Minutes Intravenous Every 24 hours 04/21/14 2211 04/22/14 0855   04/22/14 0645  vancomycin (VANCOCIN) 1,750 mg in sodium chloride 0.9 % 500 mL IVPB  Status:  Discontinued     1,750 mg250 mL/hr over 120 Minutes Intravenous  Once 04/22/14 0634 04/22/14 0855   04/21/14 2200  cefTRIAXone (ROCEPHIN) 2 g in dextrose 5 % 50 mL IVPB  Status:  Discontinued     2 g100 mL/hr over 30 Minutes Intravenous Every 24 hours 04/21/14 2157 04/22/14 0855       Subjective:  Patient in bed, denies any chest abdominal pain, no shortness of breath, does have some pain in the left fourth and fifth toes. No focal weakness.     Objective: Filed Vitals:   04/28/14 1000  BP: 111/69  Pulse: 69  Temp:   Resp:     Intake/Output Summary (Last 24 hours) at 04/28/14 1013 Last data filed at 04/27/14 1800  Gross per 24 hour  Intake    240 ml  Output      0 ml  Net    240 ml   Filed Weights   04/24/14 0611 04/24/14 1012 04/28/14 0744  Weight: 86.6 kg (190 lb 14.7 oz) 86 kg (189 lb 9.5 oz) 86.6 kg (190 lb 14.7 oz)    Exam:  Gen: Alert Caucasian female in NAD. Chest: clear to  auscultate bilaterally, no ronchi or rales  Cardiac: Regular rate and rhythm, holosystolic murmur  Abdomen: soft, non tender, non distended, +bowel sounds. No guarding or rigidity  Extremities: Symmetrical in appearance without edema.  Left 5th toe with cellulitic changes Neurological: Alert awake oriented to time place and person.  Psychiatric: Appears normal.   Data Reviewed: Basic Metabolic Panel:  Recent Labs Lab 04/23/14 0432 04/24/14 0500 04/26/14 0828 04/27/14 0844 04/28/14 0758  NA 133* 130* 139 138 134*  K 4.1 4.8 5.3* 4.9 4.0  CL 92* 91* 92* 97 95*  CO2 25 24 25 21 28   GLUCOSE 48* 312* 109* 247* 151*  BUN 45* 31* 28* 12 23  CREATININE 8.29* 7.12* 6.21* 3.96* 5.28*  CALCIUM  9.7 8.8 10.1 8.8 9.2  PHOS 7.1*  --   --   --  4.9*   Liver Function Tests:  Recent Labs Lab 04/21/14 1455 04/23/14 0432 04/24/14 0500 04/28/14 0758  AST 28  --  60*  --   ALT 6  --  9  --   ALKPHOS 106  --  118*  --   BILITOT 1.4*  --  1.0  --   PROT 6.9  --  6.5  --   ALBUMIN 3.1* 2.9* 2.9* 2.7*   No results for input(s): LIPASE, AMYLASE in the last 168 hours. No results for input(s): AMMONIA in the last 168 hours. CBC:  Recent Labs Lab 04/21/14 1455  04/23/14 0432 04/24/14 0500 04/26/14 0828 04/27/14 0844 04/28/14 0758  WBC 10.7*  < > 10.2 9.3 11.8* 9.0 8.4  NEUTROABS 9.5*  --   --   --   --   --   --   HGB 10.6*  < > 10.4* 10.5* 10.7* 10.9* 9.7*  HCT 33.1*  < > 33.0* 33.9* 33.2* 35.2* 31.0*  MCV 86.0  < > 86.8 89.0 88.5 91.2 90.4  PLT 315  < > 294 269 208 104* 157  < > = values in this interval not displayed. Cardiac Enzymes:  Recent Labs Lab 04/22/14 1215 04/23/14 0432  TROPONINI 0.08* 0.27*   BNP (last 3 results)  Recent Labs  07/10/13 1330  PROBNP 15005.0*   CBG:  Recent Labs Lab 04/27/14 0538 04/27/14 1144 04/27/14 1616 04/27/14 2156 04/28/14 0612  GLUCAP 210* 258* 282* 310* 158*    Recent Results (from the past 240 hour(s))  Blood culture  (routine x 2)     Status: None (Preliminary result)   Collection Time: 04/21/14  8:36 PM  Result Value Ref Range Status   Specimen Description BLOOD LEFT HAND  Final   Special Requests BOTTLES DRAWN AEROBIC AND ANAEROBIC 3CC  Final   Culture   Final           BLOOD CULTURE RECEIVED NO GROWTH TO DATE CULTURE WILL BE HELD FOR 5 DAYS BEFORE ISSUING A FINAL NEGATIVE REPORT Performed at [facility]    Report Status PENDING  Incomplete  Blood culture (routine x 2)     Status: None (Preliminary result)   Collection Time: 04/21/14  9:37 PM  Result Value Ref Range Status   Specimen Description BLOOD LEFT ARM  Final   Special Requests BOTTLES DRAWN AEROBIC ONLY 3CC  Final   Culture   Final           BLOOD CULTURE RECEIVED NO GROWTH TO DATE CULTURE WILL BE HELD FOR 5 DAYS BEFORE ISSUING A FINAL NEGATIVE REPORT Performed at [facility]    Report Status PENDING  Incomplete  Culture, routine-abscess     Status: None (Preliminary result)   Collection Time: 04/25/14 12:52 PM  Result Value Ref Range Status   Specimen Description ABSCESS  Final   Special Requests ABSC T10 T11 DISC  Final   Gram Stain   Final    FEW WBC PRESENT,BOTH PMN AND MONONUCLEAR NO SQUAMOUS EPITHELIAL CELLS SEEN NO ORGANISMS SEEN Performed at [facility]    Culture   Final    Culture reincubated for better growth Performed at [facility]    Report Status PENDING  Incomplete     Studies: No results found.  Scheduled Meds: . antiseptic oral rinse  7 mL Mouth Rinse BID  . aspirin  81 mg Oral Daily  . cefTAZidime (FORTAZ)  IV  2 g Intravenous Q T,Th,S,Su-1800  . clonazePAM  0.5 mg Oral QHS  . feeding supplement (NEPRO CARB STEADY)  237 mL Oral BID BM  . heparin subcutaneous  5,000 Units Subcutaneous 3 times per day  . insulin aspart  0-5 Units Subcutaneous QHS  . insulin aspart  0-9 Units Subcutaneous TID WC  . insulin detemir  35 Units Subcutaneous QHS  . lanthanum  1,000 mg  Oral TID WC  . linagliptin  5 mg Oral Daily  . metroNIDAZOLE  500 mg Oral Q8H  . multivitamin  1 tablet Oral QHS  . mupirocin cream   Topical BID  . pantoprazole  40 mg Oral Daily  . pentafluoroprop-tetrafluoroeth      . sodium chloride  3 mL Intravenous Q12H  . sodium chloride  3 mL Intravenous Q12H  . vancomycin  1,000 mg Intravenous Q T,Th,Sa-HD   Continuous Infusions:   Principal Problem:   Discitis of lumbar region Active Problems:   Hyponatremia   Diabetes mellitus   HTN (hypertension)   ESRD on dialysis   Diskitis   Anemia of renal disease   Elevated troponin-(0.27)   Bradycardia-transient, resolved   Diabetic foot ulcer   Diabetic infection of left foot   Necrotic toes    Time spent: 50    Hetty Linhart K M.D on 04/28/2014 at 10:13 AM  Between 7am to 7pm - Pager - [phone], After 7pm go to [email or web address] - password [email or web address]  And look for the night coverage person covering me after hours  [facility]  Office  [phone]

## 2014-04-28 NOTE — Progress Notes (Signed)
Subjective:  Seen on dialysis, numbness and pain in left calf and foot 80% better, no current complaints  Objective: Vital signs in last 24 hours: Temp:  [97.7 F (36.5 C)-98.4 F (36.9 C)] 97.9 F (36.6 C) (01/16 0744) Pulse Rate:  [63-87] 69 (01/16 1030) Resp:  [18-22] 20 (01/16 0900) BP: (99-145)/(49-96) 103/50 mmHg (01/16 1030) SpO2:  [90 %-98 %] 98 % (01/16 0744) Weight:  [86.6 kg (190 lb 14.7 oz)] 86.6 kg (190 lb 14.7 oz) (01/16 0744) Weight change:   Intake/Output from previous day: 01/15 0701 - 01/16 0700 In: 240 [P.O.:240] Out: -  Intake/Output this shift:   Lab Results:  Recent Labs  04/27/14 0844 04/28/14 0758  WBC 9.0 8.4  HGB 10.9* 9.7*  HCT 35.2* 31.0*  PLT 104* 157   BMET:  Recent Labs  04/27/14 0844 04/28/14 0758  NA 138 134*  K 4.9 4.0  CL 97 95*  CO2 21 28  GLUCOSE 247* 151*  BUN 12 23  CREATININE 3.96* 5.28*  CALCIUM 8.8 9.2  ALBUMIN  --  2.7*   No results for input(s): PTH in the last 72 hours. Iron Studies: No results for input(s): IRON, TIBC, TRANSFERRIN, FERRITIN in the last 72 hours.  Studies/Results: No results found.  EXAM: General appearance:  Alert, in no apparent distress Resp:  CTA without rales, rhonchi, or wheezes Cardio:  RRR without murmur or rub GI:  + BS, soft and nontender Extremities:  Dressing on L foot, nonpitting edema Access:  AVF @ [facility] with BFR 400, also L IJ catheter  HD: [facility] 85 kgs 2K/2ca 4 hr 400/800 6200 Heparin. L IJ cath and R AVF Calcitriol 0.75 Aranesp 25 q week.  Assessment/Plan: 1. T10-T11 diskitis - s/p disk aspiration per IR 1/12, on Vancomycin & Elita QuickFortaz. 2. L foot gangrene - with L calf weakness, numbness, pain; s/p angioplasty & stenting of L SFA per Dr. Imogene Burnhen 1/14; improving. 3. ESRD - HD on TTS @ [facility], K 4.  HD today. 4. Dialysis access - Using AVF @ [facility] successfully with BFR 400, Hx MRSA cath-related sepsis in 11/2013.  Remove L IJ catheter soon.  5. HTN/Volume - BP  123/68, no meds; wt 86.6 kg, no excess fluid. 6. Anemia - Hgb 9.7, outpatient Aranesp 25 mcg qwk. 7. Sec HPT - Ca 9.2 (10.2 corrected), P 4.9; Calcitriol on hold, Fosrenol 1 g with meals. 8. Nutrition - Alb 2.7, switched to cab-mod renal diet, vitamin. 9. DM - insulin per primary.   LOS: 7 days   LYLES,CHARLES 04/28/2014,11:15 AM  Pt seen, examined and agree w A/P as above.  Vinson Moselleob Namiyah Grantham MD pager [phone]    cell [phone] 04/28/2014, 12:41 PM

## 2014-04-29 LAB — GLUCOSE, CAPILLARY
GLUCOSE-CAPILLARY: 119 mg/dL — AB (ref 70–99)
GLUCOSE-CAPILLARY: 115 mg/dL — AB (ref 70–99)
GLUCOSE-CAPILLARY: 219 mg/dL — AB (ref 70–99)
Glucose-Capillary: 163 mg/dL — ABNORMAL HIGH (ref 70–99)

## 2014-04-29 MED ORDER — DEXTROSE 5 % IV SOLN: 2.0000 g | INTRAVENOUS | Status: DC

## 2014-04-29 NOTE — Progress Notes (Signed)
Subjective:  Doing well, no complaints  Objective: Vital signs in last 24 hours: Temp:  [97.8 F (36.6 C)-98.7 F (37.1 C)] 98.3 F (36.8 C) (01/17 0541) Pulse Rate:  [67-75] 72 (01/17 0541) Resp:  [14-19] 16 (01/17 0541) BP: (95-136)/(38-101) 122/67 mmHg (01/17 0541) SpO2:  [97 %-100 %] 97 % (01/17 0541) Weight:  [83.1 kg (183 lb 3.2 oz)-84.6 kg (186 lb 8.2 oz)] 83.1 kg (183 lb 3.2 oz) (01/16 2042) Weight change:   Intake/Output from previous day: 01/16 0701 - 01/17 0700 In: 730 [P.O.:730] Out: 2000  Intake/Output this shift:   Lab Results:  Recent Labs  04/27/14 0844 04/28/14 0758  WBC 9.0 8.4  HGB 10.9* 9.7*  HCT 35.2* 31.0*  PLT 104* 157   BMET:   Recent Labs  04/27/14 0844 04/28/14 0758  NA 138 134*  K 4.9 4.0  CL 97 95*  CO2 21 28  GLUCOSE 247* 151*  BUN 12 23  CREATININE 3.96* 5.28*  CALCIUM 8.8 9.2  ALBUMIN  --  2.7*   No results for input(s): PTH in the last 72 hours. Iron Studies: No results for input(s): IRON, TIBC, TRANSFERRIN, FERRITIN in the last 72 hours.  Studies/Results: No results found.  EXAM: General appearance:  Alert, in no apparent distress Resp:  CTA without rales, rhonchi, or wheezes Cardio:  RRR without murmur or rub GI:  + BS, soft and nontender Extremities:  Dressing on L foot, no edema, L 5th toe darkened Access:  AVF @ [facility] with BFR 400, also L IJ catheter  HD: [facility] 85 kgs 2K/2ca 4 hr 400/800 6200 Heparin. L IJ cath and R AVF Calcitriol 0.75 Aranesp 25 q week.  Assessment 1. Lower extremity weakness / falls at home - due to discitis/ cord compression and/or ischemic left leg. Improving symptoms.  2. Ischemic L lower extremity - s/p L SFA stent, has distal disease prob not treatable per [facility].  3. L foot gangrene - ortho eval pending 4. Abnormal MRI - T10/11 complete loss of disc space height, endplate erosion, extensive marrow changes and phlegmon/material extension into epidural space with moderate  spinal stenosis. S/P disk aspiration per IR 1/12, cx's negative, on Vancomycin & Elita QuickFortaz. Nsurg following.  5. ESRD - HD on TTS @ [facility], K 4.  HD today. 6. Dialysis access - Using AVF @ [facility] successfully with BFR 400.  Plan is to have tunneled HD cath removed this week 7. Hx MRSA cath sepsis Aug 2015  8. HTN/Volume - BP's good, under dry wt and looks a bit dry on exam 9. Anemia - Hgb 9.7, outpatient Aranesp 25 mcg qwk. 10. Sec HPT - Ca 9.2 (10.2 corrected), P 4.9; Calcitriol on hold, Fosrenol 1 g with meals. 11. Nutrition - Alb 2.7, switched to cab-mod renal diet, vitamin. 12. DM - insulin per primary.  Plan - HD Tuesday  Vinson Moselleob Borna Wessinger MD pager [phone]    cell [phone] 04/29/2014, 9:54 AM

## 2014-04-29 NOTE — Progress Notes (Signed)
[facility] PROGRESS NOTE  [name] DDU:[id number] DOB: 05-Feb-1961 DOA: 04/21/2014 PCP: Pearla Dubonnet, MD  Summary  [name] is a 54 y.o. female with a history of ESRD on HD, HTN, IDDM, Lumbar DDD who presents to the ED with complaints of progressive weakness over the past month in both legs and her knee buckling and precipitating a fall today. She was not able to get up and was brought to the ED. She also had an injury to her Left 5th Toe 1 month ago and she saw her PCP, and over the month she developed increased redness so her dialysis doctor placed over on IV Vancomycin to treat a cellulitis with her dialysis Rx 4 days ago. In the ED tonight, An X-Ray of the Left Foot was performed and was negative for fracture and findings of Osteomyelitis, and an MRI of the Lumbar Spine which revealed +diskitis/Osteomyelitis of the T10-T11 disk. NeuroSurgery Dr. Tressie Stalker was consulted and patient was placed on IV Vancomycin and Rocephin and referred for medical admission.  She was here seen by infectious disease, cardiology, IR for CT guided aspiration of her discitis which was done on 04/25/2014, she also developed worsening pain in her left fourth and fifth toe which was suspicious for ischemic etiology, vascular surgery was consulted and she is due for arteriogram with a possible revascularization surgery later.   Assessment/Plan:  Discitis -MRI of lumbar spine evidence of discitis/osteomyelitis of T10-T11 disc -ID on board, rec's- IR guided aspirate 1/13, results pending, placed on IV Van and Ceftazimide per ID - Follow Aspirate cultures   Gangrene toe -4th and 5th digit w/ ischemic changes and worsening pain -MRI shows no osteo,  Left Venous duplex- no evidence of DVT - Seen by Vascular L Saph Vein stenting by Vascular Dr Imogene Burn 04-26-13, not a bypass candidate, placed on ASA -Continue pain control when necessary -Continue IV Vancomycin and Ceftazimide (day  2)  Elevated troponin - Denies any chest pain -Troponin mildly elevated at 0.27 -Per Cards- no further workup planned  ESRD on dialysis -Nephrology on board -Receives HD T, R, Sat.  Last HD 1/12 -Scheduled for HD today, 1/14  Mobitz type I heart block/bradycardia -Remains asymptomatic -2D echo 04/2014- EF 55-60%, mild LVH, mild AS -Repeat EKG- NSR -Atenolol discontinued  Diabetes mellitus -CBGs controlled -Hemoglobin A1c 9.5 -Levemir dose adjusted  continue SSI,  and Tradjental 5mg  daily.     CBG (last 3)   Recent Labs  04/28/14 1651 04/28/14 2119 04/29/14 0653  GLUCAP 78 85 119*     Malignant hypertension -BP stable -Atenolol discontinued due to episode of bradycardia  Anemia of renal disease -Hgb 10.5 -Continue [place]-vit -Repeat CBC in a.m.  Metabolic bone disease -Due to ESRD -Continue Fosrenol  Leukocytosis -Resolved- likely due to discitis, cellulitis to   Anxiety Continue Klonopin QHS  Obesity BMI 36, follow with PCP       DVT Prophylaxis SCDs - Heparin Code Status: Full Family Communication:  No family at bedside Disposition Plan: SNF   Consultants:  ID  Nephrology  Cardiology  Neurosurgery  Vas Surgery  IR  Procedures:  Joint Aspiration 04/25/13  CT guided T-L spine aspiration 04-25-13 by IR   By Vascular Dr Imogene Burn - 04-26-14  1. Right common femoral artery cannulation under ultrasound guidance 2. Placement of catheter in aorta 3. Aortogram 4. Left second order arterial selecgtion 5. Left leg runoff 6. Angioplasty and stenting of Left superficial femoral artery (5 mm x 30 mm self-expanding)  7. Right leg runoff  Antibiotics:  Anti-infectives    Start     Dose/Rate Route Frequency Ordered Stop   04/26/14 1800  cefTAZidime (FORTAZ) 2 g in dextrose 5 % 50 mL IVPB     2 g100 mL/hr over 30 Minutes Intravenous Every T-Th-S-Su (1800) 04/25/14 1618     04/26/14 1200  vancomycin (VANCOCIN) IVPB 1000 mg/200 mL premix      1,000 mg200 mL/hr over 60 Minutes Intravenous Every T-Th-Sa (Hemodialysis) 04/25/14 1618     04/25/14 2300  metroNIDAZOLE (FLAGYL) tablet 500 mg     500 mg Oral Every 8 hours 04/25/14 2208     04/25/14 1700  vancomycin (VANCOCIN) 2,000 mg in sodium chloride 0.9 % 500 mL IVPB     2,000 mg250 mL/hr over 120 Minutes Intravenous  Once 04/25/14 1618 04/25/14 1934   04/25/14 1700  cefTAZidime (FORTAZ) 2 g in dextrose 5 % 50 mL IVPB     2 g100 mL/hr over 30 Minutes Intravenous  Once 04/25/14 1618 04/25/14 1701   04/24/14 1200  vancomycin (VANCOCIN) IVPB 1000 mg/200 mL premix  Status:  Discontinued     1,000 mg200 mL/hr over 60 Minutes Intravenous Every T-Th-Sa (Hemodialysis) 04/21/14 2200 04/22/14 0855   04/22/14 2200  cefTRIAXone (ROCEPHIN) 1 g in dextrose 5 % 50 mL IVPB  Status:  Discontinued     1 g100 mL/hr over 30 Minutes Intravenous Every 24 hours 04/21/14 2211 04/22/14 0855   04/22/14 0645  vancomycin (VANCOCIN) 1,750 mg in sodium chloride 0.9 % 500 mL IVPB  Status:  Discontinued     1,750 mg250 mL/hr over 120 Minutes Intravenous  Once 04/22/14 0634 04/22/14 0855   04/21/14 2200  cefTRIAXone (ROCEPHIN) 2 g in dextrose 5 % 50 mL IVPB  Status:  Discontinued     2 g100 mL/hr over 30 Minutes Intravenous Every 24 hours 04/21/14 2157 04/22/14 0855       Subjective:  Patient in bed, denies any chest abdominal pain, no shortness of breath, does have some pain in the left fourth and fifth toes. No focal weakness.     Objective: Filed Vitals:   04/29/14 0541  BP: 122/67  Pulse: 72  Temp: 98.3 F (36.8 C)  Resp: 16    Intake/Output Summary (Last 24 hours) at 04/29/14 1104 Last data filed at 04/29/14 0422  Gross per 24 hour  Intake    730 ml  Output   2000 ml  Net  -1270 ml   Filed Weights   04/28/14 0744 04/28/14 1148 04/28/14 2042  Weight: 86.6 kg (190 lb 14.7 oz) 84.6 kg (186 lb 8.2 oz) 83.1 kg (183 lb 3.2 oz)    Exam:  Gen: Alert Caucasian female in NAD. Chest: clear to  auscultate bilaterally, no ronchi or rales  Cardiac: Regular rate and rhythm, holosystolic murmur  Abdomen: soft, non tender, non distended, +bowel sounds. No guarding or rigidity  Extremities: Symmetrical in appearance without edema.  Left 5th toe with cellulitic changes Neurological: Alert awake oriented to time place and person.  Psychiatric: Appears normal.   Data Reviewed: Basic Metabolic Panel:  Recent Labs Lab 04/23/14 0432 04/24/14 0500 04/26/14 0828 04/27/14 0844 04/28/14 0758  NA 133* 130* 139 138 134*  K 4.1 4.8 5.3* 4.9 4.0  CL 92* 91* 92* 97 95*  CO2 25 24 25 21 28   GLUCOSE 48* 312* 109* 247* 151*  BUN 45* 31* 28* 12 23  CREATININE 8.29* 7.12* 6.21* 3.96* 5.28*  CALCIUM 9.7 8.8  10.1 8.8 9.2  PHOS 7.1*  --   --   --  4.9*   Liver Function Tests:  Recent Labs Lab 04/23/14 0432 04/24/14 0500 04/28/14 0758  AST  --  60*  --   ALT  --  9  --   ALKPHOS  --  118*  --   BILITOT  --  1.0  --   PROT  --  6.5  --   ALBUMIN 2.9* 2.9* 2.7*   No results for input(s): LIPASE, AMYLASE in the last 168 hours. No results for input(s): AMMONIA in the last 168 hours. CBC:  Recent Labs Lab 04/23/14 0432 04/24/14 0500 04/26/14 0828 04/27/14 0844 04/28/14 0758  WBC 10.2 9.3 11.8* 9.0 8.4  HGB 10.4* 10.5* 10.7* 10.9* 9.7*  HCT 33.0* 33.9* 33.2* 35.2* 31.0*  MCV 86.8 89.0 88.5 91.2 90.4  PLT 294 269 208 104* 157   Cardiac Enzymes:  Recent Labs Lab 04/22/14 1215 04/23/14 0432  TROPONINI 0.08* 0.27*   BNP (last 3 results)  Recent Labs  07/10/13 1330  PROBNP 15005.0*   CBG:  Recent Labs Lab 04/28/14 0612 04/28/14 1219 04/28/14 1651 04/28/14 2119 04/29/14 0653  GLUCAP 158* 107* 78 85 119*    Recent Results (from the past 240 hour(s))  Blood culture (routine x 2)     Status: None   Collection Time: 04/21/14  8:36 PM  Result Value Ref Range Status   Specimen Description BLOOD LEFT HAND  Final   Special Requests BOTTLES DRAWN AEROBIC AND ANAEROBIC  3CC  Final   Culture   Final    NO GROWTH 5 DAYS Performed at [facility]    Report Status 04/28/2014 FINAL  Final  Blood culture (routine x 2)     Status: None   Collection Time: 04/21/14  9:37 PM  Result Value Ref Range Status   Specimen Description BLOOD LEFT ARM  Final   Special Requests BOTTLES DRAWN AEROBIC ONLY 3CC  Final   Culture   Final    NO GROWTH 5 DAYS Performed at [facility]    Report Status 04/28/2014 FINAL  Final  Culture, routine-abscess     Status: None (Preliminary result)   Collection Time: 04/25/14 12:52 PM  Result Value Ref Range Status   Specimen Description ABSCESS  Final   Special Requests ABSC T10 T11 DISC  Final   Gram Stain   Final    FEW WBC PRESENT,BOTH PMN AND MONONUCLEAR NO SQUAMOUS EPITHELIAL CELLS SEEN NO ORGANISMS SEEN Performed at [facility]    Culture   Final    Culture reincubated for better growth Performed at [facility]    Report Status PENDING  Incomplete     Studies: No results found.  Scheduled Meds: . antiseptic oral rinse  7 mL Mouth Rinse BID  . aspirin  81 mg Oral Daily  . cefTAZidime (FORTAZ)  IV  2 g Intravenous Q T,Th,S,Su-1800  . clonazePAM  0.5 mg Oral QHS  . feeding supplement (NEPRO CARB STEADY)  237 mL Oral BID BM  . heparin subcutaneous  5,000 Units Subcutaneous 3 times per day  . insulin aspart  0-5 Units Subcutaneous QHS  . insulin aspart  0-9 Units Subcutaneous TID WC  . insulin detemir  35 Units Subcutaneous QHS  . lanthanum  1,000 mg Oral TID WC  . linagliptin  5 mg Oral Daily  . metroNIDAZOLE  500 mg Oral Q8H  . multivitamin  1 tablet Oral QHS  .  mupirocin cream   Topical BID  . pantoprazole  40 mg Oral Daily  . sodium chloride  3 mL Intravenous Q12H  . sodium chloride  3 mL Intravenous Q12H  . vancomycin  1,000 mg Intravenous Q T,Th,Sa-HD   Continuous Infusions:   Principal Problem:   Discitis of lumbar region Active Problems:   Hyponatremia   Diabetes  mellitus   HTN (hypertension)   ESRD on dialysis   Diskitis   Anemia of renal disease   Elevated troponin-(0.27)   Bradycardia-transient, resolved   Diabetic foot ulcer   Diabetic infection of left foot   Necrotic toes    Time spent: 60    SINGH,PRASHANT K M.D on 04/29/2014 at 11:04 AM  Between 7am to 7pm - Pager - [phone], After 7pm go to [email or web address] - password [email or web address]  And look for the night coverage person covering me after hours  [facility]  Office  [phone]

## 2014-04-29 NOTE — Progress Notes (Signed)
[facility]    Subjective: no acute complaints    Antibiotics:  Anti-infectives    Start     Dose/Rate Route Frequency Ordered Stop   04/26/14 1800  cefTAZidime (FORTAZ) 2 g in dextrose 5 % 50 mL IVPB     2 g100 mL/hr over 30 Minutes Intravenous Every T-Th-S-Su (1800) 04/25/14 1618     04/26/14 1200  vancomycin (VANCOCIN) IVPB 1000 mg/200 mL premix     1,000 mg200 mL/hr over 60 Minutes Intravenous Every T-Th-Sa (Hemodialysis) 04/25/14 1618     04/25/14 2300  metroNIDAZOLE (FLAGYL) tablet 500 mg     500 mg Oral Every 8 hours 04/25/14 2208     04/25/14 1700  vancomycin (VANCOCIN) 2,000 mg in sodium chloride 0.9 % 500 mL IVPB     2,000 mg250 mL/hr over 120 Minutes Intravenous  Once 04/25/14 1618 04/25/14 1934   04/25/14 1700  cefTAZidime (FORTAZ) 2 g in dextrose 5 % 50 mL IVPB     2 g100 mL/hr over 30 Minutes Intravenous  Once 04/25/14 1618 04/25/14 1701   04/24/14 1200  vancomycin (VANCOCIN) IVPB 1000 mg/200 mL premix  Status:  Discontinued     1,000 mg200 mL/hr over 60 Minutes Intravenous Every T-Th-Sa (Hemodialysis) 04/21/14 2200 04/22/14 0855   04/22/14 2200  cefTRIAXone (ROCEPHIN) 1 g in dextrose 5 % 50 mL IVPB  Status:  Discontinued     1 g100 mL/hr over 30 Minutes Intravenous Every 24 hours 04/21/14 2211 04/22/14 0855   04/22/14 0645  vancomycin (VANCOCIN) 1,750 mg in sodium chloride 0.9 % 500 mL IVPB  Status:  Discontinued     1,750 mg250 mL/hr over 120 Minutes Intravenous  Once 04/22/14 0634 04/22/14 0855   04/21/14 2200  cefTRIAXone (ROCEPHIN) 2 g in dextrose 5 % 50 mL IVPB  Status:  Discontinued     2 g100 mL/hr over 30 Minutes Intravenous Every 24 hours 04/21/14 2157 04/22/14 0855      Medications: Scheduled Meds: . antiseptic oral rinse  7 mL Mouth Rinse BID  . aspirin  81 mg Oral Daily  . cefTAZidime (FORTAZ)  IV  2 g Intravenous Q T,Th,S,Su-1800  . clonazePAM  0.5 mg Oral QHS  . feeding supplement (NEPRO CARB STEADY)  237 mL Oral BID BM   . heparin subcutaneous  5,000 Units Subcutaneous 3 times per day  . insulin aspart  0-5 Units Subcutaneous QHS  . insulin aspart  0-9 Units Subcutaneous TID WC  . insulin detemir  35 Units Subcutaneous QHS  . lanthanum  1,000 mg Oral TID WC  . linagliptin  5 mg Oral Daily  . metroNIDAZOLE  500 mg Oral Q8H  . multivitamin  1 tablet Oral QHS  . mupirocin cream   Topical BID  . pantoprazole  40 mg Oral Daily  . sodium chloride  3 mL Intravenous Q12H  . sodium chloride  3 mL Intravenous Q12H  . vancomycin  1,000 mg Intravenous Q T,Th,Sa-HD   Continuous Infusions:  PRN Meds:.sodium chloride, sodium chloride, sodium chloride, sodium chloride, acetaminophen **OR** [DISCONTINUED] acetaminophen, acetaminophen, alum & mag hydroxide-simeth, feeding supplement (NEPRO CARB STEADY), heparin, HYDROmorphone (DILAUDID) injection, lanthanum, lidocaine (PF), lidocaine-prilocaine, morphine injection, ondansetron (ZOFRAN) IV, ondansetron, oxyCODONE, oxyCODONE-acetaminophen, pentafluoroprop-tetrafluoroeth, sodium chloride, sodium chloride    Objective: Weight change:   Intake/Output Summary (Last 24 hours) at 04/29/14 1439 Last data filed at 04/29/14 0422  Gross per 24 hour  Intake    610 ml  Output  0 ml  Net    610 ml   Blood pressure 122/67, pulse 72, temperature 98.3 F (36.8 C), temperature source Oral, resp. rate 16, height  (1.549 m), weight 183 lb 3.2 oz (83.1 kg), SpO2 97 %. Temp:  [98.2 F (36.8 C)-98.3 F (36.8 C)] 98.3 F (36.8 C) (01/17 0541) Pulse Rate:  [71-75] 72 (01/17 0541) Resp:  [16-18] 16 (01/17 0541) BP: (117-135)/(51-101) 122/67 mmHg (01/17 0541) SpO2:  [97 %-100 %] 97 % (01/17 0541) Weight:  [183 lb 3.2 oz (83.1 kg)] 183 lb 3.2 oz (83.1 kg) (01/16 2042)  Physical Exam: General: Alert and awake, oriented x3, not in any acute distress. Having echocardiogram performed CV: rrr Pulm: no wheezing  Neuro: able to move lower extremities vs gravity and resistance but  4/5 and  Extremities:  She preferred  not let me examine today  See pictures below:  Left foot:   04/23/14          Left foot today 02/24/15:        Foot wrapped today could not see toes     CBC:  CBC Latest Ref Rng 04/28/2014 04/27/2014 04/26/2014  WBC 4.0 - 10.5 K/uL 8.4 9.0 11.8(H)  Hemoglobin 12.0 - 15.0 g/dL 4.0(J) 10.9(L) 10.7(L)  Hematocrit 36.0 - 46.0 % 31.0(L) 35.2(L) 33.2(L)  Platelets 150 - 400 K/uL 157 104(L) 208     BMET  Recent Labs  04/27/14 0844 04/28/14 0758  NA 138 134*  K 4.9 4.0  CL 97 95*  CO2 21 28  GLUCOSE 247* 151*  BUN 12 23  CREATININE 3.96* 5.28*  CALCIUM 8.8 9.2     Liver Panel   Recent Labs  04/28/14 0758  ALBUMIN 2.7*       Sedimentation Rate No results for input(s): ESRSEDRATE in the last 72 hours. C-Reactive Protein No results for input(s): CRP in the last 72 hours.  Micro Results: Recent Results (from the past 720 hour(s))  Blood culture (routine x 2)     Status: None   Collection Time: 04/21/14  8:36 PM  Result Value Ref Range Status   Specimen Description BLOOD LEFT HAND  Final   Special Requests BOTTLES DRAWN AEROBIC AND ANAEROBIC 3CC  Final   Culture   Final    NO GROWTH 5 DAYS Performed at [facility]    Report Status 04/28/2014 FINAL  Final  Blood culture (routine x 2)     Status: None   Collection Time: 04/21/14  9:37 PM  Result Value Ref Range Status   Specimen Description BLOOD LEFT ARM  Final   Special Requests BOTTLES DRAWN AEROBIC ONLY 3CC  Final   Culture   Final    NO GROWTH 5 DAYS Performed at [facility]    Report Status 04/28/2014 FINAL  Final  Culture, routine-abscess     Status: None (Preliminary result)   Collection Time: 04/25/14 12:52 PM  Result Value Ref Range Status   Specimen Description ABSCESS  Final   Special Requests ABSC T10 T11 DISC  Final   Gram Stain   Final    FEW WBC PRESENT,BOTH PMN AND MONONUCLEAR NO SQUAMOUS EPITHELIAL CELLS  SEEN NO ORGANISMS SEEN Performed at [facility]    Culture   Final    Culture reincubated for better growth Performed at [facility]    Report Status PENDING  Incomplete    Studies/Results: No results found.    Assessment/Plan:  Principal Problem:   Discitis of  lumbar region Active Problems:   Hyponatremia   Diabetes mellitus   HTN (hypertension)   ESRD on dialysis   Diskitis   Anemia of renal disease   Elevated troponin-(0.27)   Bradycardia-transient, resolved   Diabetic foot ulcer   Diabetic infection of left foot   Necrotic toes    [name] is a 54 y.o. 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 She had a prior infected right chest hemodialysis catheter in September. I called her dialysis center on 1 over [facility] and indeed on 12/14/2013 she had grown methicillin S  coagulase-negative staphylococcal species out of 2 different blood culture sites. She had been started on vancomycin and had cleared her blood cultures she had the catheter removed and there is a catheter tip culture from inpatient world here that is negative. She completed at least a six-week course of antibiotics. She had actually also again been on antibiotics in the form of vancomycin for left toe infection. Her toe has not shown evidence of osteomyelitis on plain films though more aggressive imaging has not yet been performed. MRI also was without evidence of osteomyelitis. However she has some ischemic changes there and they seem to have worsened with severe pain today after having had hemodialysis  #1 Ischemic changes 4th and 5th digit: I consulted Dr. Hart [place]  And pt has also had ANgiogram with stenting of superficial femoral artery.   Dr. Lajoyce Corners evaluating for need for amputation. Patient wants to keep her toes but I am skeptical she will be able to do so. If she does not have amputation will need to follow VERY  closely after DC  Continue vanco/ceftaz and flagyl  #2 Discitis: aspirate with mixed bacteria, I am asking micro IF they can work any of these species up for ID and sensis  continue vancomycin and ceftazidime,   Dr Drue Second taking over service tomorrow.     LOS: 8 days   Acey Lav 04/29/2014, 2:39 PM

## 2014-04-30 DIAGNOSIS — M549 Dorsalgia, unspecified: Secondary | ICD-10-CM

## 2014-04-30 DIAGNOSIS — R531 Weakness: Secondary | ICD-10-CM

## 2014-04-30 LAB — GLUCOSE, CAPILLARY
GLUCOSE-CAPILLARY: 55 mg/dL — AB (ref 70–99)
GLUCOSE-CAPILLARY: 58 mg/dL — AB (ref 70–99)
GLUCOSE-CAPILLARY: 128 mg/dL — AB (ref 70–99)
Glucose-Capillary: 55 mg/dL — ABNORMAL LOW (ref 70–99)
Glucose-Capillary: 87 mg/dL (ref 70–99)
Glucose-Capillary: 112 mg/dL — ABNORMAL HIGH (ref 70–99)
Glucose-Capillary: 217 mg/dL — ABNORMAL HIGH (ref 70–99)

## 2014-04-30 LAB — CULTURE, ROUTINE-ABSCESS

## 2014-04-30 MED ORDER — INSULIN DETEMIR 100 UNIT/ML ~~LOC~~ SOLN
25.0000 [IU] | Freq: Every day | SUBCUTANEOUS | Status: DC
  Administered 2014-04-30 – 2014-05-01 (×2): 25 [IU] via SUBCUTANEOUS
  Filled 2014-04-30 (×4): qty 0.25

## 2014-04-30 MED ORDER — DARBEPOETIN ALFA 60 MCG/0.3ML IJ SOSY: 60.0000 ug | PREFILLED_SYRINGE | INTRAMUSCULAR | Status: DC

## 2014-04-30 MED ORDER — DARBEPOETIN ALFA 60 MCG/0.3ML IJ SOSY
60.0000 ug | PREFILLED_SYRINGE | INTRAMUSCULAR | Status: DC
  Administered 2014-05-01: 60 ug via INTRAVENOUS
  Filled 2014-04-30: qty 0.3

## 2014-04-30 MED ORDER — GLUCOSE-VITAMIN C 4-6 GM-MG PO CHEW
CHEWABLE_TABLET | ORAL | Status: AC
  Administered 2014-04-30: 4 g
  Filled 2014-04-30: qty 2

## 2014-04-30 MED ORDER — LIDOCAINE HCL 1 % IJ SOLN
20.0000 mL | Freq: Once | INTRAMUSCULAR | Status: DC
  Filled 2014-04-30: qty 20

## 2014-04-30 MED ORDER — GLUCOSE-VITAMIN C 4-6 GM-MG PO CHEW
CHEWABLE_TABLET | ORAL | Status: AC
  Administered 2014-04-30: 8 g
  Filled 2014-04-30: qty 1

## 2014-04-30 NOTE — Progress Notes (Signed)
PT TREATMENT NOTE  Clinical Impression: PT returned to assist pt back to bed as pt requested. Pt with ability to WB through L LE. PT to con't to follow but awaiting decision on when L foot surgery will take place. Pt left in bed with call light.    04/30/14 1528  PT Visit Information  Last PT Received On 04/30/14  Assistance Needed +1  History of Present Illness Patient is a 54 y/o female with PMH of history of ESRD on HD,  HTN, IDDM, Lumbar DDD who presents to the ED with complaints of progressive weakness over the past month in both legs with knee buckling precipitating a fall today (1/9). MRI of lumbar spine evidence of discitis/osteomyelitis of T10-T11 disc. Pt with injury to her Left 5th Toe 1 month ago. IR- aspiration today 1/13.Left SFA stent as well.    PT Time Calculation  PT Start Time (ACUTE ONLY) 1528  PT Stop Time (ACUTE ONLY) 1543  PT Time Calculation (min) (ACUTE ONLY) 15 min  Subjective Data  Subjective Pt remains emotional  Precautions  Precautions Fall  Restrictions  Weight Bearing Restrictions No  Pain Assessment  Pain Assessment 0-10  Pain Score 7  Pain Location left foot  Pain Descriptors / Indicators Sore  Cognition  Arousal/Alertness Awake/alert  Behavior During Therapy Anxious  Overall Cognitive Status Within Functional Limits for tasks assessed  Bed Mobility  Overal bed mobility Needs Assistance  Bed Mobility Sit to Supine  Sit to sidelying Min assist  General bed mobility comments pt requires assist with LE back into bed  Transfers  Overall transfer level Needs assistance  Equipment used Rolling walker (2 wheeled)  Transfers Sit to/from BJ'sStand;Stand Pivot Transfers  Sit to Stand Max assist  Stand pivot transfers Min assist  General transfer comment pt with difficulty transitioning hands from chair to walker. Pt able to WB through L heel and took 3 steps to edge of bed  PT General Charges  $$ ACUTE PT VISIT 1 Procedure  PT Treatments  $Therapeutic  Activity 8-22 mins    Lewis ShockAshly Bennett Ram, PT, DPT Pager #: [phone] Office #: [phone]

## 2014-04-30 NOTE — Progress Notes (Signed)
[facility]    Subjective: no acute complaints    Antibiotics:  Anti-infectives    Start     Dose/Rate Route Frequency Ordered Stop   05/01/14 1200  cefTAZidime (FORTAZ) 2 g in dextrose 5 % 50 mL IVPB  Status:  Discontinued     2 g100 mL/hr over 30 Minutes Intravenous Every T-Th-Sa (Hemodialysis) 04/29/14 1551 04/30/14 0947   04/26/14 1800  cefTAZidime (FORTAZ) 2 g in dextrose 5 % 50 mL IVPB  Status:  Discontinued     2 g100 mL/hr over 30 Minutes Intravenous Every T-Th-S-Su (1800) 04/25/14 1618 04/29/14 1551   04/26/14 1200  vancomycin (VANCOCIN) IVPB 1000 mg/200 mL premix     1,000 mg200 mL/hr over 60 Minutes Intravenous Every T-Th-Sa (Hemodialysis) 04/25/14 1618     04/25/14 2300  metroNIDAZOLE (FLAGYL) tablet 500 mg     500 mg Oral Every 8 hours 04/25/14 2208     04/25/14 1700  vancomycin (VANCOCIN) 2,000 mg in sodium chloride 0.9 % 500 mL IVPB     2,000 mg250 mL/hr over 120 Minutes Intravenous  Once 04/25/14 1618 04/25/14 1934   04/25/14 1700  cefTAZidime (FORTAZ) 2 g in dextrose 5 % 50 mL IVPB     2 g100 mL/hr over 30 Minutes Intravenous  Once 04/25/14 1618 04/25/14 1701   04/24/14 1200  vancomycin (VANCOCIN) IVPB 1000 mg/200 mL premix  Status:  Discontinued     1,000 mg200 mL/hr over 60 Minutes Intravenous Every T-Th-Sa (Hemodialysis) 04/21/14 2200 04/22/14 0855   04/22/14 2200  cefTRIAXone (ROCEPHIN) 1 g in dextrose 5 % 50 mL IVPB  Status:  Discontinued     1 g100 mL/hr over 30 Minutes Intravenous Every 24 hours 04/21/14 2211 04/22/14 0855   04/22/14 0645  vancomycin (VANCOCIN) 1,750 mg in sodium chloride 0.9 % 500 mL IVPB  Status:  Discontinued     1,750 mg250 mL/hr over 120 Minutes Intravenous  Once 04/22/14 0634 04/22/14 0855   04/21/14 2200  cefTRIAXone (ROCEPHIN) 2 g in dextrose 5 % 50 mL IVPB  Status:  Discontinued     2 g100 mL/hr over 30 Minutes Intravenous Every 24 hours 04/21/14 2157 04/22/14 0855      Medications: Scheduled Meds: .  antiseptic oral rinse  7 mL Mouth Rinse BID  . aspirin  81 mg Oral Daily  . clonazePAM  0.5 mg Oral QHS  . [START ON 05/01/2014] darbepoetin (ARANESP) injection - DIALYSIS  60 mcg Intravenous Q Tue-HD  . feeding supplement (NEPRO CARB STEADY)  237 mL Oral BID BM  . heparin subcutaneous  5,000 Units Subcutaneous 3 times per day  . insulin aspart  0-9 Units Subcutaneous TID WC  . insulin detemir  25 Units Subcutaneous QHS  . lanthanum  1,000 mg Oral TID WC  . lidocaine  20 mL Intradermal Once  . linagliptin  5 mg Oral Daily  . metroNIDAZOLE  500 mg Oral Q8H  . multivitamin  1 tablet Oral QHS  . mupirocin cream   Topical BID  . pantoprazole  40 mg Oral Daily  . sodium chloride  3 mL Intravenous Q12H  . sodium chloride  3 mL Intravenous Q12H  . vancomycin  1,000 mg Intravenous Q T,Th,Sa-HD   Objective: Blood pressure 125/60, pulse 68, temperature 98.2 F (36.8 C), temperature source Oral, resp. rate 18, height  (1.549 m), weight 183 lb 3.2 oz (83.1 kg), SpO2 99 %. Temp:  [98.2 F (36.8 C)-98.3 F (36.8 C)]  98.2 F (36.8 C) (01/18 0601) Pulse Rate:  [68-80] 68 (01/18 0601) Resp:  [16-18] 18 (01/18 0601) BP: (125-151)/(60-68) 125/60 mmHg (01/18 0601) SpO2:  [99 %-100 %] 99 % (01/18 0601)  Physical Exam: General: Alert and awake, oriented x3, not in any acute distress.  CV: rrr Pulm: no wheezing Neuro: able to move lower extremities vs gravity and resistance but 4/5  Ext: left little toe, ischemic changes  CBC:  CBC Latest Ref Rng 04/28/2014 04/27/2014 04/26/2014  WBC 4.0 - 10.5 K/uL 8.4 9.0 11.8(H)  Hemoglobin 12.0 - 15.0 g/dL 4.0(J) 10.9(L) 10.7(L)  Hematocrit 36.0 - 46.0 % 31.0(L) 35.2(L) 33.2(L)  Platelets 150 - 400 K/uL 157 104(L) 208     BMET  Recent Labs  04/28/14 0758  NA 134*  K 4.0  CL 95*  CO2 28  GLUCOSE 151*  BUN 23  CREATININE 5.28*  CALCIUM 9.2     Liver Panel   Recent Labs  04/28/14 0758  ALBUMIN 2.7*   Lab Results  Component Value  Date   ESRSEDRATE 53* 04/23/2014   Lab Results  Component Value Date   CRP 8.4* 04/23/2014     Micro Results: Recent Results (from the past 720 hour(s))  Blood culture (routine x 2)     Status: None   Collection Time: 04/21/14  8:36 PM  Result Value Ref Range Status   Specimen Description BLOOD LEFT HAND  Final   Special Requests BOTTLES DRAWN AEROBIC AND ANAEROBIC 3CC  Final   Culture   Final    NO GROWTH 5 DAYS Performed at [facility]    Report Status 04/28/2014 FINAL  Final  Blood culture (routine x 2)     Status: None   Collection Time: 04/21/14  9:37 PM  Result Value Ref Range Status   Specimen Description BLOOD LEFT ARM  Final   Special Requests BOTTLES DRAWN AEROBIC ONLY 3CC  Final   Culture   Final    NO GROWTH 5 DAYS Performed at [facility]    Report Status 04/28/2014 FINAL  Final  Culture, routine-abscess     Status: None   Collection Time: 04/25/14 12:52 PM  Result Value Ref Range Status   Specimen Description ABSCESS  Final   Special Requests ABSC T10 T11 DISC  Final   Gram Stain   Final    FEW WBC PRESENT,BOTH PMN AND MONONUCLEAR NO SQUAMOUS EPITHELIAL CELLS SEEN NO ORGANISMS SEEN Performed at [facility]    Culture   Final    FEW STAPHYLOCOCCUS SPECIES (COAGULASE NEGATIVE) Note: RIFAMPIN AND GENTAMICIN SHOULD NOT BE USED AS SINGLE DRUGS FOR TREATMENT OF STAPH INFECTIONS. Performed at [facility]    Report Status 04/30/2014 FINAL  Final   Organism ID, Bacteria STAPHYLOCOCCUS SPECIES (COAGULASE NEGATIVE)  Final      Susceptibility   Staphylococcus species (coagulase negative) - MIC*    CLINDAMYCIN >=8 RESISTANT Resistant     ERYTHROMYCIN >=8 RESISTANT Resistant     GENTAMICIN >=16 RESISTANT Resistant     LEVOFLOXACIN >=8 RESISTANT Resistant     OXACILLIN >=4 RESISTANT Resistant     PENICILLIN >=0.5 RESISTANT Resistant     RIFAMPIN <=0.5 SENSITIVE Sensitive     TRIMETH/SULFA >=320 RESISTANT Resistant      VANCOMYCIN 2 SENSITIVE Sensitive     TETRACYCLINE 2 SENSITIVE Sensitive     * FEW STAPHYLOCOCCUS SPECIES (COAGULASE NEGATIVE)    Studies/Results: No results found.    Assessment/Plan:  Principal Problem:  Discitis of lumbar region Active Problems:   Hyponatremia   Diabetes mellitus   HTN (hypertension)   ESRD on dialysis   Diskitis   Anemia of renal disease   Elevated troponin-(0.27)   Bradycardia-transient, resolved   Diabetic foot ulcer   Diabetic infection of left foot   Necrotic toes    [name] is a 54 y.o. female with IDDM with ESRD, with remote spine surgery hx,PVD,  now found to have  thoracic spine diskitis. 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 She underwent aspiration of thoracic spine abscess with cx growing Oxacillin Resistant CoNS. Currently on vanco/ceftaz and flagyl  #1 Discitis: appears to have mixed flora, but they did isolate CoNS. Will recommend 6 wk of vancomycin. No need for cefepime since PsA not isolated. Would use day of removal of current HD catheter as day #1 of antibiotics of 42.   #2 ischemic changes to 4/5th toe of left foot.- concern that this would become infected. Dr. Lajoyce Cornersduda to evaluate for amputation surgery. Patient states that she is ok with having amputation in order to improve her health  #3 back pain and weakness of legs = recommend she follows up with neurosurgery for evaluation (seen during this hospitalization, prior pt of [facility]) as o/p.  Spent 45 min with patient with greater than 50% in counseling with family, and patient  Will arrange for f/u with dr. Zenaida NieceVan dam or myself in 6 wk   LOS: 9 days   Mateya Torti 04/30/2014, 1:33 PM

## 2014-04-30 NOTE — Progress Notes (Signed)
Physical Therapy Treatment Patient Details Name: [name] MRN: [id number] DOB: November 09, 1960 Today's Date: 04/30/2014    History of Present Illness Patient is a 54 y/o female with PMH of history of ESRD on HD,  HTN, IDDM, Lumbar DDD who presents to the ED with complaints of progressive weakness over the past month in both legs with knee buckling precipitating a fall today (1/9). MRI of lumbar spine evidence of discitis/osteomyelitis of T10-T11 disc. Pt with injury to her Left 5th Toe 1 month ago. IR- aspiration today 1/13.Left SFA stent as well.      PT Comments    Pt extremely anxious and emotional greatly limiting OOB mobility. With max encouragement pt able to complete std pvt transfer to chair however with significant increase in time. Spoke with patient regarding relaxation breathing and focusing on positive things instead of negative. Pt extremely anxious about being left in chair.  PT assured pt she would come back to assist pt back to bed.   Follow Up Recommendations  SNF;Supervision/Assistance - 24 hour     Equipment Recommendations  None recommended by PT    Recommendations for Other Services       Precautions / Restrictions Precautions Precautions: Fall Restrictions Weight Bearing Restrictions: No    Mobility  Bed Mobility Overal bed mobility: Needs Assistance Bed Mobility: Supine to Sit;Sit to Supine     Supine to sit: Min guard   Sit to sidelying: Min assist (for LES) General bed mobility comments: pt required significant increase in time and prefers to direct her treatment and movement due to  onset of nausea  Transfers Overall transfer level: Needs assistance Equipment used: Rolling walker (2 wheeled) Transfers: Sit to/from [employer] Sit to Stand: Mod assist;+2 physical assistance Stand pivot transfers: Min assist;+2 physical assistance       General transfer comment: modA to maintain upright position during transitino of hand  from bed to RW due to limited L LE WBing tolerance. max directional v/'cs to complete stand pvt, max encouragement due to high anxiety  Ambulation/Gait             General Gait Details: unable   Stairs            Wheelchair Mobility    Modified Rankin (Stroke Patients Only)       Balance           Standing balance support: Bilateral upper extremity supported Standing balance-Leahy Scale: Poor Standing balance comment: requires bilat UE support                    Cognition Arousal/Alertness: Awake/alert Behavior During Therapy: Anxious Overall Cognitive Status: Within Functional Limits for tasks assessed                      Exercises General Exercises - Lower Extremity Ankle Circles/Pumps: AROM;Both;10 reps Quad Sets: AROM;Both;10 reps Gluteal Sets: AROM;Both;10 reps Heel Slides: AROM;Both;10 reps    General Comments        Pertinent Vitals/Pain Pain Assessment: 0-10 Pain Score: 9  (during WBing) Pain Location: left foot Pain Descriptors / Indicators: Sore Pain Intervention(s): Limited activity within patient's tolerance;Monitored during session    Home Living                      Prior Function            PT Goals (current goals can now be found in the care plan section) Progress  towards PT goals: Progressing toward goals    Frequency  Min 2X/week    PT Plan Current plan remains appropriate    Co-evaluation             End of Session Equipment Utilized During Treatment: Gait belt Activity Tolerance:  (limited by anxiety) Patient left: in chair;with call bell/phone within reach     Time: 1343-1414 PT Time Calculation (min) (ACUTE ONLY): 31 min  Charges:  $Therapeutic Exercise: 8-22 mins $Therapeutic Activity: 8-22 mins                    G Codes:      Marcene BrawnChadwell, [name] 04/30/2014, 4:28 PM   Lewis ShockAshly Petrea Fredenburg, PT, DPT Pager #: [phone] Office #: [phone]

## 2014-04-30 NOTE — Progress Notes (Signed)
Subjective:  Only cos of weakness/  Dr. Lajoyce Corners to see /PT to work with mobility / lives alone /cannot get up out of bed without max assistance   Objective Vital signs in last 24 hours: Filed Vitals:   04/29/14 0541 04/29/14 1550 04/29/14 1932 04/30/14 0601  BP: 122/67 134/64 151/68 125/60  Pulse: 72 78 80 68  Temp: 98.3 F (36.8 C) 98.3 F (36.8 C) 98.3 F (36.8 C) 98.2 F (36.8 C)  TempSrc: Oral Oral Oral Oral  Resp: Height:      Weight:      SpO2: 97% 99% 100% 99%   Weight change:   Physical Exam: General: Alert, NAD Resp: CTA  bilat Cardio: RRR no  murmur or rub GI: obese, + BS, soft and nontender Extremities: Dressing on L foot ot removed/ no  Pedal edema Access: AVF @ [facility] with BFR 400, also L IJ catheter  HD: [facility] 85 kgs 2K/2ca 4 hr 400/800 6200 Heparin. L IJ cath and R AVF Calcitriol 0.75 Aranesp 25 q week.  Problem/Plan: 1. Lower extremity weakness / falls at home - due to discitis/ cord compression and/or ischemic left leg. Improving symptoms but deconditioned / attempt hd in recliner in am/ needs PT  And at  Dc  In hosp rehab vs NHP  2. Ischemic L lower extremity - s/p L SFA stent Dr. Imogene Burn   3. L foot gangrene - ortho eval pending/ Dr. Lajoyce Corners to see 4. Discitis  - T10/11 complete loss of disc space height, endplate erosion, extensive marrow changes and phlegmon/material extension into epidural space with moderate spinal stenosis. S/P disk aspiration per IR 1/12, cx's negative, on Vancomycin & Elita Quick. ID/ Nsurg following.  5. ESRD - HD on TTS @ [place], hd in am  6. Dialysis access - Using AVF @ [facility] successfully with BFR 400. Plan is to have tunneled HD cath removed this week will contact [facility] to remove 7. Hx MRSA cath sepsis Aug 2015 -  8. HTN/Volume - BP's good, under dry wt and looks a bit dry on exam 9. Anemia - Hgb 9.7, was on  outpatient Aranesp 25 mcg qwk. Increase  To 60  Give in am 10. Sec HPT - Ca 9.2 (10.2 corrected), P  4.9; Calcitriol on hold, Fosrenol 1 g with meals. 11. Nutrition - Alb 2.7,  cab-mod renal diet, vitamin. Wants to try breeze supplement  instead of nepro 12. DM - insulin per primary.  Katherine Pastel, PA-C [facility] Beeper [phone] 04/30/2014,10:55 AM  LOS: 9 days   Labs: Basic Metabolic Panel:  Recent Labs Lab 04/26/14 0828 04/27/14 0844 04/28/14 0758  NA 139 138 134*  K 5.3* 4.9 4.0  CL 92* 97 95*  CO2 GLUCOSE 109* 247* 151*  BUN 28* 12 23  CREATININE 6.21* 3.96* 5.28*  CALCIUM 10.1 8.8 9.2  PHOS  --   --  4.9*   Liver Function Tests:  Recent Labs Lab 04/24/14 0500 04/28/14 0758  AST 60*  --   ALT 9  --   ALKPHOS 118*  --   BILITOT 1.0  --   PROT 6.5  --   ALBUMIN 2.9* 2.7*  CBC:  Recent Labs Lab 04/24/14 0500 04/26/14 0828 04/27/14 0844 04/28/14 0758  WBC 9.3 11.8* 9.0 8.4  HGB 10.5* 10.7* 10.9* 9.7*  HCT 33.9* 33.2* 35.2* 31.0*  MCV 89.0 88.5 91.2 90.4  PLT 269 208 104* 157  CBG:  Recent  Labs Lab 04/29/14 2116 04/30/14 0600 04/30/14 0632 04/30/14 0711 04/30/14 0846  GLUCAP 219* 55* 55* 58* 87    Studies/Results: No results found. Medications:   . antiseptic oral rinse  7 mL Mouth Rinse BID  . aspirin  81 mg Oral Daily  . clonazePAM  0.5 mg Oral QHS  . feeding supplement (NEPRO CARB STEADY)  237 mL Oral BID BM  . heparin subcutaneous  5,000 Units Subcutaneous 3 times per day  . insulin aspart  0-9 Units Subcutaneous TID WC  . insulin detemir  25 Units Subcutaneous QHS  . lanthanum  1,000 mg Oral TID WC  . linagliptin  5 mg Oral Daily  . metroNIDAZOLE  500 mg Oral Q8H  . multivitamin  1 tablet Oral QHS  . mupirocin cream   Topical BID  . pantoprazole  40 mg Oral Daily  . sodium chloride  3 mL Intravenous Q12H  . sodium chloride  3 mL Intravenous Q12H  . vancomycin  1,000 mg Intravenous Q T,Th,Sa-HD     I have seen and examined this patient and agree with plan as outlined by Katherine Pastelavid Zeyfang, PA-C.  Ms.  [name] has agreed to proceed with toe amputation and we have been able to use her R AVF for the last 4 treatments successfully.  [facility] and the next successful cannulation of her AVF. Javious Hallisey A,MD 04/30/2014 3:25 PM

## 2014-04-30 NOTE — Progress Notes (Signed)
[facility] PROGRESS NOTE  CHACE BISCH AOZ:[id number] DOB: 1960/07/17 DOA: 04/21/2014 PCP: Pearla Dubonnet, MD  Summary  [name] is a 54 y.o. female with a history of ESRD on HD, HTN, IDDM, Lumbar DDD who presents to the ED with complaints of progressive weakness over the past month in both legs and her knee buckling and precipitating a fall today. She was not able to get up and was brought to the ED. She also had an injury to her Left 5th Toe 1 month ago and she saw her PCP, and over the month she developed increased redness so her dialysis doctor placed over on IV Vancomycin to treat a cellulitis with her dialysis Rx 4 days ago. In the ED tonight, An X-Ray of the Left Foot was performed and was negative for fracture and findings of Osteomyelitis, and an MRI of the Lumbar Spine which revealed +diskitis/Osteomyelitis of the T10-T11 disk. NeuroSurgery Dr. Tressie Stalker was consulted and patient was placed on IV Vancomycin and Rocephin and referred for medical admission.  She was here seen by infectious disease, cardiology, IR for CT guided aspiration of her discitis which was done on 04/25/2014, she also developed worsening pain in her left fourth and fifth toe which was suspicious for ischemic etiology, vascular surgery was consulted and she is due for arteriogram with a possible revascularization surgery later.   Assessment/Plan:  Discitis -MRI of lumbar spine evidence of discitis/osteomyelitis of T10-T11 disc -ID on board, rec's- IR guided aspirate 1/13, currently on IV Van and Ceftazimide per ID - Aspiration cultures now growing coag-negative staph likely can stop ceftaz  . Will defer to ID   Gangrene toe -4th and 5th digit w/ ischemic changes and worsening pain, MRI shows no osteo,  Left Venous duplex- no evidence of DVT - Seen by Vascular L Saph Vein stenting by Vascular Dr Imogene Burn 04-26-13, not a bypass candidate, placed on ASA -Likely this too is not  salvageable have reconsulted Dr. Lajoyce Corners patient agreeable for amputation if needed.  Elevated troponin - Denies any chest pain -Troponin mildly elevated at 0.27 -Per Cards- no further workup planned  ESRD on dialysis -Nephrology on board -Receives HD T, R, Sat.  Last HD 1/12 -Scheduled for HD today, 1/14  Mobitz type I heart block/bradycardia -Remains asymptomatic -2D echo 04/2014- EF 55-60%, mild LVH, mild AS -Repeat EKG- NSR -Atenolol discontinued  Diabetes mellitus -CBGs controlled -Hemoglobin A1c 9.5 -Levemir dose adjusted  continue SSI,  and Tradjental  daily.     CBG (last 3)   Recent Labs  04/30/14 0600 04/30/14 0632 04/30/14 0711  GLUCAP 55* 55* 58*     Malignant hypertension -BP stable -Atenolol discontinued due to episode of bradycardia  Anemia of renal disease -Hgb 10.5 -Continue [place]-vit -Repeat CBC in a.m.  Metabolic bone disease -Due to ESRD -Continue Fosrenol  Leukocytosis -Resolved- likely due to discitis, cellulitis to   Anxiety Continue Klonopin QHS  Obesity BMI 36, follow with PCP       DVT Prophylaxis SCDs - Heparin Code Status: Full Family Communication:  No family at bedside Disposition Plan: SNF   Consultants:  ID  Nephrology  Cardiology  Neurosurgery  Vas Surgery  IR  Procedures:  Joint Aspiration 04/25/13  CT guided T-L spine aspiration 04-25-13 by IR   By Vascular Dr Imogene Burn - 04-26-14  1. Right common femoral artery cannulation under ultrasound guidance 2. Placement of catheter in aorta 3. Aortogram 4. Left second order arterial selecgtion 5. Left leg runoff  6. Angioplasty and stenting of Left superficial femoral artery (5 mm x 30 mm self-expanding) 7. Right leg runoff  Antibiotics:  Anti-infectives    Start     Dose/Rate Route Frequency Ordered Stop   05/01/14 1200  cefTAZidime (FORTAZ) 2 g in dextrose 5 % 50 mL IVPB     2 g100 mL/hr over 30 Minutes Intravenous Every T-Th-Sa  (Hemodialysis) 04/29/14 1551     04/26/14 1800  cefTAZidime (FORTAZ) 2 g in dextrose 5 % 50 mL IVPB  Status:  Discontinued     2 g100 mL/hr over 30 Minutes Intravenous Every T-Th-S-Su (1800) 04/25/14 1618 04/29/14 1551   04/26/14 1200  vancomycin (VANCOCIN) IVPB 1000 mg/200 mL premix     1,000 mg200 mL/hr over 60 Minutes Intravenous Every T-Th-Sa (Hemodialysis) 04/25/14 1618     04/25/14 2300  metroNIDAZOLE (FLAGYL) tablet 500 mg     500 mg Oral Every 8 hours 04/25/14 2208     04/25/14 1700  vancomycin (VANCOCIN) 2,000 mg in sodium chloride 0.9 % 500 mL IVPB     2,000 mg250 mL/hr over 120 Minutes Intravenous  Once 04/25/14 1618 04/25/14 1934   04/25/14 1700  cefTAZidime (FORTAZ) 2 g in dextrose 5 % 50 mL IVPB     2 g100 mL/hr over 30 Minutes Intravenous  Once 04/25/14 1618 04/25/14 1701   04/24/14 1200  vancomycin (VANCOCIN) IVPB 1000 mg/200 mL premix  Status:  Discontinued     1,000 mg200 mL/hr over 60 Minutes Intravenous Every T-Th-Sa (Hemodialysis) 04/21/14 2200 04/22/14 0855   04/22/14 2200  cefTRIAXone (ROCEPHIN) 1 g in dextrose 5 % 50 mL IVPB  Status:  Discontinued     1 g100 mL/hr over 30 Minutes Intravenous Every 24 hours 04/21/14 2211 04/22/14 0855   04/22/14 0645  vancomycin (VANCOCIN) 1,750 mg in sodium chloride 0.9 % 500 mL IVPB  Status:  Discontinued     1,750 mg250 mL/hr over 120 Minutes Intravenous  Once 04/22/14 0634 04/22/14 0855   04/21/14 2200  cefTRIAXone (ROCEPHIN) 2 g in dextrose 5 % 50 mL IVPB  Status:  Discontinued     2 g100 mL/hr over 30 Minutes Intravenous Every 24 hours 04/21/14 2157 04/22/14 0855       Subjective:  Patient in bed, denies any chest abdominal pain, no shortness of breath, does have some pain in the left fourth and fifth toes. No focal weakness.     Objective: Filed Vitals:   04/30/14 0601  BP: 125/60  Pulse: 68  Temp: 98.2 F (36.8 C)  Resp: 18    Intake/Output Summary (Last 24 hours) at 04/30/14 0923 Last data filed at 04/30/14  0602  Gross per 24 hour  Intake   1320 ml  Output      0 ml  Net   1320 ml   Filed Weights   04/28/14 0744 04/28/14 1148 04/28/14 2042  Weight: 86.6 kg (190 lb 14.7 oz) 84.6 kg (186 lb 8.2 oz) 83.1 kg (183 lb 3.2 oz)    Exam:  Gen: Alert Caucasian female in NAD. Chest: clear to auscultate bilaterally, no ronchi or rales  Cardiac: Regular rate and rhythm, holosystolic murmur  Abdomen: soft, non tender, non distended, +bowel sounds. No guarding or rigidity  Extremities: Symmetrical in appearance without edema.  Left 5th toe with necrotic changes Neurological: Alert awake oriented to time place and person.  Psychiatric: Appears normal.       Data Reviewed: Basic Metabolic Panel:  Recent Labs Lab 04/24/14 0500 04/26/14  [id number] 04/27/14 0844 04/28/14 0758  NA 130* 139 138 134*  K 4.8 5.3* 4.9 4.0  CL 91* 92* 97 95*  CO2 24 25 21 28   GLUCOSE 312* 109* 247* 151*  BUN 31* 28* 12 23  CREATININE 7.12* 6.21* 3.96* 5.28*  CALCIUM 8.8 10.1 8.8 9.2  PHOS  --   --   --  4.9*   Liver Function Tests:  Recent Labs Lab 04/24/14 0500 04/28/14 0758  AST 60*  --   ALT 9  --   ALKPHOS 118*  --   BILITOT 1.0  --   PROT 6.5  --   ALBUMIN 2.9* 2.7*   No results for input(s): LIPASE, AMYLASE in the last 168 hours. No results for input(s): AMMONIA in the last 168 hours. CBC:  Recent Labs Lab 04/24/14 0500 04/26/14 0828 04/27/14 0844 04/28/14 0758  WBC 9.3 11.8* 9.0 8.4  HGB 10.5* 10.7* 10.9* 9.7*  HCT 33.9* 33.2* 35.2* 31.0*  MCV 89.0 88.5 91.2 90.4  PLT 269 208 104* 157   Cardiac Enzymes: No results for input(s): CKTOTAL, CKMB, CKMBINDEX, TROPONINI in the last 168 hours. BNP (last 3 results)  Recent Labs  07/10/13 1330  PROBNP 15005.0*   CBG:  Recent Labs Lab 04/29/14 1644 04/29/14 2116 04/30/14 0600 04/30/14 0632 04/30/14 0711  GLUCAP 163* 219* 55* 55* 58*    Recent Results (from the past 240 hour(s))  Blood culture (routine x 2)     Status: None    Collection Time: 04/21/14  8:36 PM  Result Value Ref Range Status   Specimen Description BLOOD LEFT HAND  Final   Special Requests BOTTLES DRAWN AEROBIC AND ANAEROBIC 3CC  Final   Culture   Final    NO GROWTH 5 DAYS Performed at [facility]    Report Status 04/28/2014 FINAL  Final  Blood culture (routine x 2)     Status: None   Collection Time: 04/21/14  9:37 PM  Result Value Ref Range Status   Specimen Description BLOOD LEFT ARM  Final   Special Requests BOTTLES DRAWN AEROBIC ONLY 3CC  Final   Culture   Final    NO GROWTH 5 DAYS Performed at [facility]    Report Status 04/28/2014 FINAL  Final  Culture, routine-abscess     Status: None   Collection Time: 04/25/14 12:52 PM  Result Value Ref Range Status   Specimen Description ABSCESS  Final   Special Requests ABSC T10 T11 DISC  Final   Gram Stain   Final    FEW WBC PRESENT,BOTH PMN AND MONONUCLEAR NO SQUAMOUS EPITHELIAL CELLS SEEN NO ORGANISMS SEEN Performed at [facility]    Culture   Final    FEW STAPHYLOCOCCUS SPECIES (COAGULASE NEGATIVE) Note: RIFAMPIN AND GENTAMICIN SHOULD NOT BE USED AS SINGLE DRUGS FOR TREATMENT OF STAPH INFECTIONS. Performed at [facility]    Report Status 04/30/2014 FINAL  Final   Organism ID, Bacteria STAPHYLOCOCCUS SPECIES (COAGULASE NEGATIVE)  Final      Susceptibility   Staphylococcus species (coagulase negative) - MIC*    CLINDAMYCIN >=8 RESISTANT Resistant     ERYTHROMYCIN >=8 RESISTANT Resistant     GENTAMICIN >=16 RESISTANT Resistant     LEVOFLOXACIN >=8 RESISTANT Resistant     OXACILLIN >=4 RESISTANT Resistant     PENICILLIN >=0.5 RESISTANT Resistant     RIFAMPIN <=0.5 SENSITIVE Sensitive     TRIMETH/SULFA >=320 RESISTANT Resistant     VANCOMYCIN 2 SENSITIVE Sensitive  TETRACYCLINE 2 SENSITIVE Sensitive     * FEW STAPHYLOCOCCUS SPECIES (COAGULASE NEGATIVE)     Studies: No results found.  Scheduled Meds: . antiseptic oral rinse  7 mL Mouth  Rinse BID  . aspirin  81 mg Oral Daily  . [START ON 05/01/2014] cefTAZidime (FORTAZ)  IV  2 g Intravenous Q T,Th,Sa-HD  . clonazePAM  0.5 mg Oral QHS  . feeding supplement (NEPRO CARB STEADY)  237 mL Oral BID BM  . glucose-Vitamin C      . heparin subcutaneous  5,000 Units Subcutaneous 3 times per day  . insulin aspart  0-5 Units Subcutaneous QHS  . insulin aspart  0-9 Units Subcutaneous TID WC  . insulin detemir  35 Units Subcutaneous QHS  . lanthanum  1,000 mg Oral TID WC  . linagliptin  5 mg Oral Daily  . metroNIDAZOLE  500 mg Oral Q8H  . multivitamin  1 tablet Oral QHS  . mupirocin cream   Topical BID  . pantoprazole  40 mg Oral Daily  . sodium chloride  3 mL Intravenous Q12H  . sodium chloride  3 mL Intravenous Q12H  . vancomycin  1,000 mg Intravenous Q T,Th,Sa-HD   Continuous Infusions:   Principal Problem:   Discitis of lumbar region Active Problems:   Hyponatremia   Diabetes mellitus   HTN (hypertension)   ESRD on dialysis   Diskitis   Anemia of renal disease   Elevated troponin-(0.27)   Bradycardia-transient, resolved   Diabetic foot ulcer   Diabetic infection of left foot   Necrotic toes    Time spent: 41    SINGH,PRASHANT K M.D on 04/30/2014 at 9:23 AM  Between 7am to 7pm - Pager - [phone], After 7pm go to [email or web address] - password [email or web address]  And look for the night coverage person covering me after hours  [facility]  Office  [phone]

## 2014-04-30 NOTE — Progress Notes (Signed)
Patient ID: [name], female   DOB: 12/02/1960, 54 y.o.   MRN: [id number] Subjective:  The patient is alert and pleasant. She is in no apparent distress.  Objective: Vital signs in last 24 hours: Temp:  [98.2 F (36.8 C)-98.3 F (36.8 C)] 98.2 F (36.8 C) (01/18 0601) Pulse Rate:  [68-80] 68 (01/18 0601) Resp:  [16-18] 18 (01/18 0601) BP: (125-151)/(60-68) 125/60 mmHg (01/18 0601) SpO2:  [99 %-100 %] 99 % (01/18 0601)  Intake/Output from previous day: 01/17 0701 - 01/18 0700 In: 1560 [P.O.:1560] Out: -  Intake/Output this shift: Total I/O In: 120 [P.O.:120] Out: -   Physical exam the patient is alert and oriented 3. She is moving her lower extremities well.  Lab Results:  Recent Labs  04/28/14 0758  WBC 8.4  HGB 9.7*  HCT 31.0*  PLT 157   BMET  Recent Labs  04/28/14 0758  NA 134*  K 4.0  CL 95*  CO2 28  GLUCOSE 151*  BUN 23  CREATININE 5.28*  CALCIUM 9.2    Studies/Results: No results found.  Assessment/Plan: T10-11 discitis: This is being treated with antibiotics. ID is following the patient. I don't think I have much to offer here. I will plan to sign off. Please call if I can be of further assistance or if the patient developed significant lower extremity weakness.  LOS: 9 days     Gaynor Ferreras D 04/30/2014, 1:33 PM

## 2014-04-30 NOTE — Progress Notes (Signed)
NUTRITION FOLLOW UP  DOCUMENTATION CODES Per approved criteria  -Obesity Unspecified   INTERVENTION: Continue Nepro Shake po BID, each supplement provides 425 kcal and 19 grams protein  Encourage adequate PO intake.   NUTRITION DIAGNOSIS: Increased nutrient needs related to wound healing as evidenced by estimated nutrition needs; ongoing  Goal: Pt to meet >/= 90% of their estimated nutrition needs; met  Monitor:  PO intake, weight trends, labs, I/O's  54 y.o. female  Admitting Dx: Discitis of lumbar region  ASSESSMENT: Pt with a history of ESRD on HD, HTN, IDDM, Lumbar DDD who presents to the ED with complaints of progressive weakness over the past month in both legs and her knee buckling and precipitating a fall. She also had an injury to her Left 5th Toe with increasing redness. MRI reveals thoracic 10-11 discitis.    PROCEDURE (1/14): 1. Right common femoral artery cannulation under ultrasound guidance 2. Placement of catheter in aorta 3. Aortogram 4. Left second order arterial selecgtion 5. Left leg runoff 6. Angioplasty and stenting of Left superficial femoral artery (5 mm x 30 mm self-expanding) 7. Right leg runoff  Pt reports her appetite has been decreased. Meal completion has been 50-100%. Pt reports she has been making herself eat her food at meals. Pt additionally reports having lost of taste which has been affecting her appetite. Pt has been drinking her Nepro Shakes. Will continue with current orders. Pt was encouraged to eat her food at meals and to drink her supplements to aid in wound healing.   Labs and medications reviewed.  Height: Ht Readings from Last 1 Encounters:  04/21/14 _0  (1.549 m)    Weight: Wt Readings from Last 1 Encounters:  04/28/14 183 lb 3.2 oz (83.1 kg)  04/21/14 190 lbs  BMI:  Body mass index is 34.63 kg/(m^2). Class I obesity  Re-Estimated Nutritional Needs: Kcal: 2100-2300 Protein: 95-105 grams Fluid: 1.2  L/day  Skin: diabetic ulcer on toe, +1 LLE edema  Diet Order: Diet renal/carb modified with 1200 ml fluid restriction   Intake/Output Summary (Last 24 hours) at 04/30/14 1315 Last data filed at 04/30/14 0830  Gross per 24 hour  Intake   1200 ml  Output      0 ml  Net   1200 ml    Last BM: 1/14  Labs:   Recent Labs Lab 04/26/14 0828 04/27/14 0844 04/28/14 0758  NA 139 138 134*  K 5.3* 4.9 4.0  CL 92* 97 95*  CO2 _1 BUN 28* 12 23  CREATININE 6.21* 3.96* 5.28*  CALCIUM 10.1 8.8 9.2  PHOS  --   --  4.9*  GLUCOSE 109* 247* 151*    CBG (last 3)   Recent Labs  04/30/14 0711 04/30/14 0846 04/30/14 1147  GLUCAP 58* 87 112*    Scheduled Meds: . antiseptic oral rinse  7 mL Mouth Rinse BID  . aspirin  81 mg Oral Daily  . clonazePAM  0.5 mg Oral QHS  . [START ON 05/01/2014] darbepoetin (ARANESP) injection - DIALYSIS  60 mcg Intravenous Q Tue-HD  . feeding supplement (NEPRO CARB STEADY)  237 mL Oral BID BM  . heparin subcutaneous  5,000 Units Subcutaneous 3 times per day  . insulin aspart  0-9 Units Subcutaneous TID WC  . insulin detemir  25 Units Subcutaneous QHS  . lanthanum  1,000 mg Oral TID WC  . linagliptin  5 mg Oral Daily  . metroNIDAZOLE  500 mg Oral Q8H  . multivitamin  1 tablet Oral QHS  . mupirocin cream   Topical BID  . pantoprazole  40 mg Oral Daily  . sodium chloride  3 mL Intravenous Q12H  . sodium chloride  3 mL Intravenous Q12H  . vancomycin  1,000 mg Intravenous Q T,Th,Sa-HD    Continuous Infusions:   Past Medical History  Diagnosis Date  . Hypertension   . Anemia   . Diabetes mellitus     Type 2  . Chronic kidney disease     T,Th,Sa dialysis  . GERD (gastroesophageal reflux disease)     protonix for "burping"  . Complication of anesthesia     had trouble being put to sleep and then difficulty waking up. States she doesn't have any issues with Propofol    Past Surgical History  Procedure Laterality Date  . Insertion of  dialysis catheter N/A 07/16/2013    Procedure: INSERTION OF DIALYSIS CATHETER;  Surgeon: Rosetta Posner, MD;  Location: [place];  Service: Vascular;  Laterality: N/A;  . Av fistula placement Right 07/18/2013    Procedure: RIGHT ARTERIOVENOUS (AV) FISTULA CREATION;  Surgeon: Elam Dutch, MD;  Location: [place];  Service: Vascular;  Laterality: Right;  . Insertion of dialysis catheter Left 12/22/2013    Procedure: INSERTION OF DIALYSIS CATHETER;  Surgeon: Rosetta Posner, MD;  Location: [place];  Service: Vascular;  Laterality: Left;  . Back surgery  17 years    lumbar surgery  . Eye surgery Bilateral     cataract surgery with lens implants  . Hysteroscopy w/ endometrial ablation    . Fistula superficialization Right 02/16/2014    Procedure:  RIGHT Fistula BRACHIOCEPHALIC Superficialization;  Surgeon: Angelia Mould, MD;  Location: [place];  Service: Vascular;  Laterality: Right;  . Fistulogram N/A 01/15/2014    Procedure: FISTULOGRAM;  Surgeon: Angelia Mould, MD;  Location: [facility] CATH LAB;  Service: Cardiovascular;  Laterality: N/A;  . Lower extremity angiogram N/A 04/26/2014    Procedure: LOWER EXTREMITY ANGIOGRAM;  Surgeon: Conrad Appal[place], MD;  Location: [facility] CATH LAB;  Service: Cardiovascular;  Laterality: N/A;    Kallie Locks, MS, RD, LDN Pager # [phone] After hours/ weekend pager # [phone]

## 2014-05-01 ENCOUNTER — Other Ambulatory Visit (HOSPITAL_COMMUNITY): Payer: Self-pay | Admitting: Orthopedic Surgery

## 2014-05-01 LAB — GLUCOSE, CAPILLARY
GLUCOSE-CAPILLARY: 127 mg/dL — AB (ref 70–99)
Glucose-Capillary: 114 mg/dL — ABNORMAL HIGH (ref 70–99)
Glucose-Capillary: 79 mg/dL (ref 70–99)
Glucose-Capillary: 62 mg/dL — ABNORMAL LOW (ref 70–99)
Glucose-Capillary: 96 mg/dL (ref 70–99)

## 2014-05-01 LAB — CBC
HCT: 32.4 % — ABNORMAL LOW (ref 36.0–46.0)
Hemoglobin: 10 g/dL — ABNORMAL LOW (ref 12.0–15.0)
MCH: 27.8 pg (ref 26.0–34.0)
MCHC: 30.9 g/dL (ref 30.0–36.0)
MCV: 90 fL (ref 78.0–100.0)
Platelets: 214 10*3/uL (ref 150–400)
RBC: 3.6 MIL/uL — AB (ref 3.87–5.11)
RDW: 20.6 % — AB (ref 11.5–15.5)
WBC: 8.3 10*3/uL (ref 4.0–10.5)

## 2014-05-01 LAB — BASIC METABOLIC PANEL
Anion gap: 17 — ABNORMAL HIGH (ref 5–15)
BUN: 23 mg/dL (ref 6–23)
CO2: 23 mmol/L (ref 19–32)
Calcium: 9.4 mg/dL (ref 8.4–10.5)
Chloride: 95 mEq/L — ABNORMAL LOW (ref 96–112)
Creatinine, Ser: 6.92 mg/dL — ABNORMAL HIGH (ref 0.50–1.10)
GFR calc non Af Amer: 6 mL/min — ABNORMAL LOW (ref >90)
GFR, EST AFRICAN AMERICAN: 7 mL/min — AB (ref >90)
Glucose, Bld: 94 mg/dL (ref 70–99)
Potassium: 4.4 mmol/L (ref 3.5–5.1)
Sodium: 135 mmol/L (ref 135–145)

## 2014-05-01 MED ORDER — GLUCOSE-VITAMIN C 4-6 GM-MG PO CHEW
CHEWABLE_TABLET | ORAL | Status: AC
  Administered 2014-05-01: 4 g
  Filled 2014-05-01: qty 2

## 2014-05-01 MED ORDER — POLYETHYLENE GLYCOL 3350 17 G PO PACK
17.0000 g | PACK | Freq: Two times a day (BID) | ORAL | Status: DC
  Administered 2014-05-02 – 2014-05-07 (×5): 17 g via ORAL
  Filled 2014-05-01 (×13): qty 1

## 2014-05-01 MED ORDER — OXYCODONE-ACETAMINOPHEN 5-325 MG PO TABS
ORAL_TABLET | ORAL | Status: AC
  Administered 2014-05-01: 1 via ORAL
  Filled 2014-05-01: qty 1

## 2014-05-01 MED ORDER — DARBEPOETIN ALFA 60 MCG/0.3ML IJ SOSY
PREFILLED_SYRINGE | INTRAMUSCULAR | Status: AC
  Filled 2014-05-01: qty 0.3

## 2014-05-01 MED ORDER — OXYCODONE HCL 5 MG PO TABS
ORAL_TABLET | ORAL | Status: AC
  Filled 2014-05-01: qty 1

## 2014-05-01 NOTE — Progress Notes (Addendum)
Notification from tele that pt experienced V-tach. Checked the pt./ no apparent sign of distress, checked leads, assessed pt. VSS stable BP- 135/63, 99% on room air, 87-HR, 17-RR, 127-CBG. Check the monitor and saw a 6 beat run of v-tach. Pt asymptomatic. Paged on call physician for Dr. Thedore MinsSingh at 10:45. Paged on call coverage K Schorr again at 11:12. Awaiting call back will continue to monitor    11:30 orders for lab draw Mg received.Will continue to monitor

## 2014-05-01 NOTE — Progress Notes (Signed)
[facility] PROGRESS NOTE  [name] ZOX:[id number] DOB: 07/08/1960 DOA: 04/21/2014 PCP: Pearla DubonnetGATES,ROBERT NEVILL, MD  Summary  Katherine EvaKimberly J Longnecker is a 54 y.o. female with a history of ESRD on HD, HTN, IDDM, Lumbar DDD who presents to the ED with complaints of progressive weakness over the past month in both legs and her knee buckling and precipitating a fall today. She was not able to get up and was brought to the ED. She also had an injury to her Left 5th Toe 1 month ago and she saw her PCP, and over the month she developed increased redness so her dialysis doctor placed over on IV Vancomycin to treat a cellulitis with her dialysis Rx 4 days ago. In the ED tonight, An X-Ray of the Left Foot was performed and was negative for fracture and findings of Osteomyelitis, and an MRI of the Lumbar Spine which revealed +diskitis/Osteomyelitis of the T10-T11 disk. NeuroSurgery Dr. Tressie StalkerJeffrey Jenkins was consulted and patient was placed on IV Vancomycin and Rocephin and referred for medical admission.  She was here seen by infectious disease, cardiology, IR for CT guided aspiration of her discitis which was done on 04/25/2014, she also developed worsening pain in her left fourth and fifth toe which was suspicious for ischemic etiology, vascular surgery was consulted and she is due for arteriogram with a possible revascularization surgery later.   Assessment/Plan:  Discitis -MRI of lumbar spine evidence of discitis/osteomyelitis of T10-T11 disc -ID on board, rec's- IR guided aspirate 1/13,   - Aspiration cultures now growing coag-negative staph.  -D/W ID SHE will get 7 weeks of IV vancomycin during dialysis runs, day 1 will be counted once her temporary dialysis catheter is removed likely in the next day or so.   Gangrene toe -4th and 5th digit w/ ischemic changes and worsening pain, MRI shows no osteo,  Left Venous duplex- no evidence of DVT - Seen by Vascular L Saph Vein stenting by  Vascular Dr Imogene Burnhen 04-26-13, not a bypass candidate, placed on ASA -Likely this too is not salvageable have reconsulted Dr. Lajoyce Cornersuda patient agreeable for amputation if needed.  Elevated troponin - Denies any chest pain -Troponin mildly elevated at 0.27 -Per Cards- no further workup planned  ESRD on dialysis -Nephrology on board -Receives HD T, R, Sat.  Last HD 1/12 -Scheduled for HD today, 1/14  Mobitz type I heart block/bradycardia -Remains asymptomatic -2D echo 04/2014- EF 55-60%, mild LVH, mild AS -Repeat EKG- NSR -Atenolol discontinued  Diabetes mellitus -CBGs controlled -Hemoglobin A1c 9.5 -Levemir dose adjusted  continue SSI,  and Tradjental 5mg  daily.     CBG (last 3)   Recent Labs  04/30/14 1625 04/30/14 2137 05/01/14 0632  GLUCAP 128* 217* 114*     Malignant hypertension -BP stable -Atenolol discontinued due to episode of bradycardia  Anemia of renal disease -Hgb 10.5 -Continue [place]-vit -Repeat CBC in a.m.  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 Right common femoral artery cannulation under ultrasound guidance 2.  Placement of catheter in aorta 3. Aortogram 4. Left second order arterial selecgtion 5. Left leg runoff 6. Angioplasty and stenting of Left superficial femoral artery (5 mm x 30 mm self-expanding) 7. Right leg runoff  Antibiotics:  Anti-infectives    Start     Dose/Rate Route Frequency Ordered Stop   05/01/14 1200  cefTAZidime (FORTAZ) 2 g in  dextrose 5 % 50 mL IVPB  Status:  Discontinued     2 g100 mL/hr over 30 Minutes Intravenous Every T-Th-Sa (Hemodialysis) 04/29/14 1551 04/30/14 0947   04/26/14 1800  cefTAZidime (FORTAZ) 2 g in dextrose 5 % 50 mL IVPB  Status:  Discontinued     2 g100 mL/hr over 30 Minutes Intravenous Every T-Th-S-Su (1800) 04/25/14 1618 04/29/14 1551   04/26/14 1200  vancomycin (VANCOCIN) IVPB 1000 mg/200 mL premix     1,000 mg200 mL/hr over 60 Minutes Intravenous Every T-Th-Sa (Hemodialysis) 04/25/14 1618     04/25/14 2300  metroNIDAZOLE (FLAGYL) tablet 500 mg     500 mg Oral Every 8 hours 04/25/14 2208     04/25/14 1700  vancomycin (VANCOCIN) 2,000 mg in sodium chloride 0.9 % 500 mL IVPB     2,000 mg250 mL/hr over 120 Minutes Intravenous  Once 04/25/14 1618 04/25/14 1934   04/25/14 1700  cefTAZidime (FORTAZ) 2 g in dextrose 5 % 50 mL IVPB     2 g100 mL/hr over 30 Minutes Intravenous  Once 04/25/14 1618 04/25/14 1701   04/24/14 1200  vancomycin (VANCOCIN) IVPB 1000 mg/200 mL premix  Status:  Discontinued     1,000 mg200 mL/hr over 60 Minutes Intravenous Every T-Th-Sa (Hemodialysis) 04/21/14 2200 04/22/14 0855   04/22/14 2200  cefTRIAXone (ROCEPHIN) 1 g in dextrose 5 % 50 mL IVPB  Status:  Discontinued     1 g100 mL/hr over 30 Minutes Intravenous Every 24 hours 04/21/14 2211 04/22/14 0855   04/22/14 0645  vancomycin (VANCOCIN) 1,750 mg in sodium chloride 0.9 % 500 mL IVPB  Status:  Discontinued     1,750 mg250 mL/hr over 120 Minutes Intravenous  Once 04/22/14 0634 04/22/14 0855   04/21/14 2200  cefTRIAXone (ROCEPHIN) 2 g in dextrose 5 % 50 mL IVPB  Status:  Discontinued     2 g100 mL/hr over 30 Minutes Intravenous Every 24 hours 04/21/14 2157 04/22/14 0855       Subjective:  Patient in bed, denies any chest abdominal pain, no shortness of breath, does have some pain in the left fourth and fifth toes. No focal weakness. Improved left fifth toe pain.    Objective: Filed Vitals:   05/01/14 0930  BP:  136/53  Pulse: 75  Temp:   Resp: 17    Intake/Output Summary (Last 24 hours) at 05/01/14 0954 Last data filed at 04/30/14 2244  Gross per 24 hour  Intake      3 ml  Output      0 ml  Net      3 ml   Filed Weights   04/28/14 0744 04/28/14 1148 04/28/14 2042  Weight: 86.6 kg (190 lb 14.7 oz) 84.6 kg (186 lb 8.2 oz) 83.1 kg (183 lb 3.2 oz)    Exam:  Gen: Alert Caucasian female in NAD. Chest: clear to auscultate bilaterally, no ronchi or rales  Cardiac: Regular rate and rhythm, holosystolic murmur  Abdomen: soft, non tender, non distended, +bowel sounds. No guarding or rigidity  Extremities: Symmetrical in appearance without edema.  Left 5th toe with necrotic changes Neurological: Alert awake  oriented to time place and person.  Psychiatric: Appears normal.       Data Reviewed: Basic Metabolic Panel:  Recent Labs Lab 04/26/14 0828 04/27/14 0844 04/28/14 0758  NA 139 138 134*  K 5.3* 4.9 4.0  CL 92* 97 95*  CO2 GLUCOSE 109* 247* 151*  BUN 28* 12 23  CREATININE 6.21* 3.96* 5.28*  CALCIUM 10.1 8.8 9.2  PHOS  --   --  4.9*   Liver Function Tests:  Recent Labs Lab 04/28/14 0758  ALBUMIN 2.7*   No results for input(s): LIPASE, AMYLASE in the last 168 hours. No results for input(s): AMMONIA in the last 168 hours. CBC:  Recent Labs Lab 04/26/14 0828 04/27/14 0844 04/28/14 0758  WBC 11.8* 9.0 8.4  HGB 10.7* 10.9* 9.7*  HCT 33.2* 35.2* 31.0*  MCV 88.5 91.2 90.4  PLT 208 104* 157   Cardiac Enzymes: No results for input(s): CKTOTAL, CKMB, CKMBINDEX, TROPONINI in the last 168 hours. BNP (last 3 results)  Recent Labs  07/10/13 1330  PROBNP 15005.0*   CBG:  Recent Labs Lab 04/30/14 0846 04/30/14 1147 04/30/14 1625 04/30/14 2137 05/01/14 0632  GLUCAP 87 112* 128* 217* 114*    Recent Results (from the past 240 hour(s))  Blood culture (routine x 2)     Status: None   Collection Time: 04/21/14  8:36 PM  Result Value Ref Range Status    Specimen Description BLOOD LEFT HAND  Final   Special Requests BOTTLES DRAWN AEROBIC AND ANAEROBIC 3CC  Final   Culture   Final    NO GROWTH 5 DAYS Performed at [facility]    Report Status 04/28/2014 FINAL  Final  Blood culture (routine x 2)     Status: None   Collection Time: 04/21/14  9:37 PM  Result Value Ref Range Status   Specimen Description BLOOD LEFT ARM  Final   Special Requests BOTTLES DRAWN AEROBIC ONLY 3CC  Final   Culture   Final    NO GROWTH 5 DAYS Performed at [facility]    Report Status 04/28/2014 FINAL  Final  Culture, routine-abscess     Status: None   Collection Time: 04/25/14 12:52 PM  Result Value Ref Range Status   Specimen Description ABSCESS  Final   Special Requests ABSC T10 T11 DISC  Final   Gram Stain   Final    FEW WBC PRESENT,BOTH PMN AND MONONUCLEAR NO SQUAMOUS EPITHELIAL CELLS SEEN NO ORGANISMS SEEN Performed at [facility]    Culture   Final    FEW STAPHYLOCOCCUS SPECIES (COAGULASE NEGATIVE) Note: RIFAMPIN AND GENTAMICIN SHOULD NOT BE USED AS SINGLE DRUGS FOR TREATMENT OF STAPH INFECTIONS. Performed at [facility]    Report Status 04/30/2014 FINAL  Final   Organism ID, Bacteria STAPHYLOCOCCUS SPECIES (COAGULASE NEGATIVE)  Final      Susceptibility   Staphylococcus species (coagulase negative) - MIC*    CLINDAMYCIN >=8 RESISTANT Resistant     ERYTHROMYCIN >=8 RESISTANT Resistant     GENTAMICIN >=16 RESISTANT Resistant     LEVOFLOXACIN >=8 RESISTANT Resistant     OXACILLIN >=4 RESISTANT Resistant     PENICILLIN >=0.5 RESISTANT Resistant     RIFAMPIN <=0.5 SENSITIVE Sensitive     TRIMETH/SULFA >=320 RESISTANT Resistant     VANCOMYCIN 2 SENSITIVE Sensitive     TETRACYCLINE 2 SENSITIVE Sensitive     * FEW STAPHYLOCOCCUS SPECIES (COAGULASE NEGATIVE)     Studies: No results found.  Scheduled Meds: . antiseptic oral rinse  7 mL Mouth Rinse BID  . aspirin  81 mg Oral Daily  . clonazePAM  0.5 mg Oral  QHS  . darbepoetin (ARANESP) injection - DIALYSIS  60 mcg Intravenous Q Tue-HD  . feeding supplement (NEPRO CARB STEADY)  237 mL Oral BID BM  . heparin subcutaneous  5,000 Units Subcutaneous 3 times per day  . insulin aspart  0-9 Units Subcutaneous TID WC  . insulin detemir  25 Units Subcutaneous QHS  . lanthanum  1,000 mg Oral TID WC  . lidocaine  20 mL Intradermal Once  . linagliptin  5 mg Oral Daily  . metroNIDAZOLE  500 mg Oral Q8H  . multivitamin  1 tablet Oral QHS  . mupirocin cream   Topical BID  . pantoprazole  40 mg Oral Daily  . polyethylene glycol  17 g Oral BID  . sodium chloride  3 mL Intravenous Q12H  . sodium chloride  3 mL Intravenous Q12H  . vancomycin  1,000 mg Intravenous Q T,Th,Sa-HD   Continuous Infusions:   Principal Problem:   Discitis of lumbar region Active Problems:   Hyponatremia   Diabetes mellitus   HTN (hypertension)   ESRD on dialysis   Diskitis   Anemia of renal disease   Elevated troponin-(0.27)   Bradycardia-transient, resolved   Diabetic foot ulcer   Diabetic infection of left foot   Necrotic toes    Time spent: 17    Doil Kamara K M.D on 05/01/2014 at 9:54 AM  Between 7am to 7pm - Pager - [phone], After 7pm go to [email or web address] - password [email or web address]  And look for the night coverage person covering me after hours  [facility]  Office  [phone]

## 2014-05-01 NOTE — Progress Notes (Signed)
Hypoglycemic Event  CBG: 62  Treatment: 3 glucose tabs  Symptoms: None  Follow-up CBG: Time:1700 CBG Result:97  Possible Reasons for Event: Inadequate meal intake  Comments/MD notified:no    Katherine SitesHowell, [name]  Remember to initiate Hypoglycemia Order Set & complete

## 2014-05-01 NOTE — Progress Notes (Signed)
ANTIBIOTIC CONSULT NOTE   Pharmacy Consult for vancomycin Indication: discitis and diabetic infection of foot  Allergies  Allergen Reactions  . Codeine Other (See Comments)    hallucinations   Patient Measurements: Height: 5\' 1"  (154.9 cm) Weight: 183 lb 3.2 oz (83.1 kg) IBW/kg (Calculated) : 47.8  Vital Signs: Temp: 98.3 F (36.8 C) (01/19 0915) Temp Source: Oral (01/19 0915) BP: 136/53 mmHg (01/19 0930) Pulse Rate: 75 (01/19 0930)  Labs: No results for input(s): WBC, HGB, PLT, LABCREA, CREATININE in the last 72 hours. Estimated Creatinine Clearance: 11.9 mL/min (by C-G formula based on Cr of 5.28).  Microbiology: Recent Results (from the past 720 hour(s))  Blood culture (routine x 2)     Status: None   Collection Time: 04/21/14  8:36 PM  Result Value Ref Range Status   Specimen Description BLOOD LEFT HAND  Final   Special Requests BOTTLES DRAWN AEROBIC AND ANAEROBIC 3CC  Final   Culture   Final    NO GROWTH 5 DAYS [facility]    Report Status 04/28/2014 FINAL  Final  Blood culture (routine x 2)     Status: None   Collection Time: 04/21/14  9:37 PM  Result Value Ref Range Status   Specimen Description BLOOD LEFT ARM  Final   Special Requests BOTTLES DRAWN AEROBIC ONLY 3CC  Final   Culture   Final    NO GROWTH 5 DAYS [facility]formed at [facility]    Report Status 04/28/2014 FINAL  Final  Culture, routine-abscess     Status: None   Collection Time: 04/25/14 12:52 PM  Result Value Ref Range Status   Specimen Description ABSCESS  Final   Special Requests ABSC T10 T11 DISC  Final   Gram Stain   Final    FEW WBC PRESENT,BOTH PMN AND MONONUCLEAR NO SQUAMOUS EPITHELIAL CELLS SEEN NO ORGANISMS SEEN [facility]formed [facility]    Culture   Final    FEW STAPHYLOCOCCUS SPECIES (COAGULASE NEGATIVE) Note: RIFAMPIN AND GENTAMICIN SHOULD NOT BE USED AS SINGLE DRUGS FOR TREATMENT OF STAPH INFECTIONS. Performed at [facility]    Report Status 04/30/2014 FINAL  Final   Organism ID, Bacteria STAPHYLOCOCCUS SPECIES (COAGULASE NEGATIVE)  Final      Susceptibility   Staphylococcus species (coagulase negative) - MIC*    CLINDAMYCIN >=8 RESISTANT Resistant     ERYTHROMYCIN >=8 RESISTANT Resistant     GENTAMICIN >=16 RESISTANT Resistant     LEVOFLOXACIN >=8 RESISTANT Resistant     OXACILLIN >=4 RESISTANT Resistant     PENICILLIN >=0.5 RESISTANT Resistant     RIFAMPIN <=0.5 SENSITIVE Sensitive     TRIMETH/SULFA >=320 RESISTANT Resistant     VANCOMYCIN 2 SENSITIVE Sensitive     TETRACYCLINE 2 SENSITIVE Sensitive     * FEW STAPHYLOCOCCUS SPECIES (COAGULASE NEGATIVE)   Assessment: 54 yo F admitted with c/o back pain, weakness and a fall. 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 She is now s/p aspiration of T10-T11 disc.  CTX x1 on1/9, vanc ordered 1/10- never given, actually started on 1/13 and continues. Also continues on PO Flagyl per MD. ceftazadime was stopped yesterday by ID as no psuedomonas was isolated.  She is s/p angiography and stenting of left superficial femoral artery. Plans for 4th and 5th toe amputation on Wed or Fri of this week per Dr. Lajoyce Cornersuda. No fevers overnight.  1/13 disc  abscess cx - CoNS, S to vanc 1/09 bld x2 - negative  Per ID, planning for 6 weeks of vancomycin- D#1 is removal of current HD catheter- per renal notes, plans to remove this week by [facility].  Goal of Therapy:  Pre-HD vancomycin level 15-25 mcg/ml  Plan:  -continue Vancomycin 1g qhd - TTS, consider trough next week at steady state -f/u HD schedule and any changes -f/u date of removal of HD catheter  Bevan Disney D. Maneh Sieben, PharmD, BCPS Clinical Pharmacist Pager: [phone] 05/01/2014 10:11 AM

## 2014-05-01 NOTE — Procedures (Signed)
Patient was seen on dialysis and the procedure was supervised. BFR 400 Via RUE AVF  BP is 149/53.  Patient appears to be tolerating treatment well and plans for toe amputations in next few days.

## 2014-05-01 NOTE — Progress Notes (Signed)
Patient ID: [name], female   DOB: 08/06/1960, 54 y.o.   MRN: [id number] Patient still with gangrenous changes to the left foot fourth and fifth toes. Patient has pain to light touch there is no purulence no cellulitis no signs of infection. After discussion with the patient she agrees to proceed with amputation of the fourth and fifth toes. We will plan for surgery either Wednesday or Friday.

## 2014-05-02 LAB — GLUCOSE, CAPILLARY
GLUCOSE-CAPILLARY: 52 mg/dL — AB (ref 70–99)
GLUCOSE-CAPILLARY: 129 mg/dL — AB (ref 70–99)
GLUCOSE-CAPILLARY: 92 mg/dL (ref 70–99)
GLUCOSE-CAPILLARY: 171 mg/dL — AB (ref 70–99)
GLUCOSE-CAPILLARY: 368 mg/dL — AB (ref 70–99)
Glucose-Capillary: 58 mg/dL — ABNORMAL LOW (ref 70–99)
Glucose-Capillary: 214 mg/dL — ABNORMAL HIGH (ref 70–99)

## 2014-05-02 LAB — CBC
HCT: 34.9 % — ABNORMAL LOW (ref 36.0–46.0)
Hemoglobin: 11 g/dL — ABNORMAL LOW (ref 12.0–15.0)
MCH: 29 pg (ref 26.0–34.0)
MCHC: 31.5 g/dL (ref 30.0–36.0)
MCV: 92.1 fL (ref 78.0–100.0)
Platelets: 155 10*3/uL (ref 150–400)
RBC: 3.79 MIL/uL — ABNORMAL LOW (ref 3.87–5.11)
RDW: 21.3 % — ABNORMAL HIGH (ref 11.5–15.5)
WBC: 10 10*3/uL (ref 4.0–10.5)

## 2014-05-02 LAB — SURGICAL PCR SCREEN
MRSA, PCR: NEGATIVE
STAPHYLOCOCCUS AUREUS: NEGATIVE

## 2014-05-02 LAB — MAGNESIUM: Magnesium: 2.4 mg/dL (ref 1.5–2.5)

## 2014-05-02 MED ORDER — HYDROMORPHONE HCL 1 MG/ML IJ SOLN
1.0000 mg | INTRAMUSCULAR | Status: DC | PRN
  Administered 2014-05-02 – 2014-05-04 (×9): 1 mg via INTRAVENOUS
  Filled 2014-05-02 (×8): qty 1

## 2014-05-02 MED ORDER — HYDROMORPHONE HCL 1 MG/ML PO LIQD: 1.0000 mg | ORAL | Status: DC | PRN

## 2014-05-02 MED ORDER — CEFAZOLIN SODIUM-DEXTROSE 2-3 GM-% IV SOLR
2.0000 g | INTRAVENOUS | Status: DC
  Filled 2014-05-02 (×2): qty 50

## 2014-05-02 MED ORDER — OXYCODONE-ACETAMINOPHEN 5-325 MG PO TABS
1.0000 | ORAL_TABLET | ORAL | Status: DC | PRN
  Administered 2014-05-02 – 2014-05-04 (×9): 1 via ORAL
  Filled 2014-05-02 (×9): qty 1

## 2014-05-02 MED ORDER — CHLORHEXIDINE GLUCONATE 4 % EX LIQD
60.0000 mL | Freq: Once | CUTANEOUS | Status: AC
  Administered 2014-05-04: 4 via TOPICAL
  Filled 2014-05-02: qty 60

## 2014-05-02 MED ORDER — HYDROMORPHONE HCL 2 MG PO TABS: 1.0000 mg | ORAL_TABLET | ORAL | Status: DC | PRN

## 2014-05-02 MED ORDER — INSULIN DETEMIR 100 UNIT/ML ~~LOC~~ SOLN
20.0000 [IU] | Freq: Every day | SUBCUTANEOUS | Status: DC
  Administered 2014-05-02 – 2014-05-04 (×3): 20 [IU] via SUBCUTANEOUS
  Filled 2014-05-02 (×4): qty 0.2

## 2014-05-02 MED ORDER — GLUCOSE-VITAMIN C 4-6 GM-MG PO CHEW
CHEWABLE_TABLET | ORAL | Status: AC
  Filled 2014-05-02: qty 2

## 2014-05-02 MED ORDER — ONDANSETRON HCL 4 MG PO TABS
24.0000 mg | ORAL_TABLET | Freq: Four times a day (QID) | ORAL | Status: DC | PRN
  Filled 2014-05-02: qty 6

## 2014-05-02 MED ORDER — METOPROLOL TARTRATE 25 MG PO TABS: 25.0000 mg | ORAL_TABLET | Freq: Two times a day (BID) | ORAL | Status: DC

## 2014-05-02 MED ORDER — ONDANSETRON HCL 4 MG/2ML IJ SOLN
4.0000 mg | INTRAMUSCULAR | Status: DC | PRN
  Filled 2014-05-02 (×2): qty 2

## 2014-05-02 MED ORDER — BOOST / RESOURCE BREEZE PO LIQD
1.0000 | Freq: Three times a day (TID) | ORAL | Status: DC
  Administered 2014-05-02 – 2014-05-07 (×11): 1 via ORAL

## 2014-05-02 NOTE — Progress Notes (Signed)
PT Cancellation Note  Patient Details Name: [name] MRN: [id number] DOB: 07/14/1960   Cancelled Treatment:    Reason Eval/Treat Not Completed: Pain limiting ability to participate. Attempted at 1145 and pt in 11/10 pain awaiting pain medication. Attempted at 1530 and 1600 and pt trying to have BM. PT to return as able.   Marcene BrawnChadwell, Kalene Cutler Marie 05/02/2014, 4:21 PM  Lewis ShockAshly Aycen Porreca, PT, DPT Pager #: [phone] Office #: [phone]

## 2014-05-02 NOTE — Progress Notes (Signed)
[facility] PROGRESS NOTE  [name] DDU:[id number] DOB: 1960-11-06 DOA: 04/21/2014 PCP: Pearla Dubonnet, MD  Summary  [name] is a 54 y.o. female with a history of ESRD on HD, HTN, IDDM, Lumbar DDD who presents to the ED with complaints of progressive weakness over the past month in both legs and her knee buckling and precipitating a fall today. She was not able to get up and was brought to the ED. She also had an injury to her Left 5th Toe 1 month ago and she saw her PCP, and over the month she developed increased redness so her dialysis doctor placed over on IV Vancomycin to treat a cellulitis with her dialysis Rx 4 days ago. In the ED tonight, An X-Ray of the Left Foot was performed and was negative for fracture and findings of Osteomyelitis, and an MRI of the Lumbar Spine which revealed +diskitis/Osteomyelitis of the T10-T11 disk. NeuroSurgery Dr. Tressie Stalker was consulted and patient was placed on IV Vancomycin and Rocephin and referred for medical admission.   As per Neurosurgery no surgery and continue IV antibiotics. Based on CT guided aspiration data she will be placed on 6 weeks of vancomycin during her dialysis runs. ID on board. Also being followed by renal for dialysis, she was seen by IR for CT-guided disc aspiration. Cardiology had also seen the patient this admission.  She also developed worsening pain in her left fourth and fifth toe which was suspicious for ischemic etiology, vascular surgery was consulted and she underwent arteriogram with a L Fem stent placement, however she had distal disease as well which was not surgically repairable as she was thought to be a poor candidate for bypass. She is due for left fourth and fifth toe amputation by Dr. Lajoyce Corners on 05/04/2014. Thereafter her left temporary dialysis catheter will be removed by IR as she has a functioning right arm fistula for dialysis.   Assessment/Plan:  Discitis -MRI of lumbar  spine evidence of discitis/osteomyelitis of T10-T11 disc, status post CT-guided aspiration. Growing coag-negative staph. Per ID she will get 7 weeks of IV vancomycin during dialysis runs, day 1 will be counted once her temporary dialysis catheter is removed which is planned after her toe removal surgery on 05/04/2014.   Seen by neurosurgery this admission Dr. Lovell Sheehan. Per Dr. Lovell Sheehan no surgical intervention needed.   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 -Consulted Dr. Lajoyce Corners is due for left fourth and fifth toe amputation on 05/04/2014.  Elevated troponin - Chest pain-free, non-ACS pattern troponin trend. Seen by cardiology no further intervention continue present medical care which includes aspirin, beta blocker due to bradycardia earlier in admission.  ESRD on dialysis -Nephrology on board -Receives HD T, R, Sat.    -Removed temporary left IJ dialysis catheter after toe amputation surgery. She has a functioning right arm AV fistula.  Mobitz type I heart block/bradycardia -Remains asymptomatic, 2D echo 04/2014- EF 55-60%, mild LVH, mild AS, a blocker stopped. Repeat EKG stable.    Diabetes mellitus -Hemoglobin A1c 9.5, Levemir dose adjusted as nighttime sugars were slightly low, continue SSI,  and Tradjental  daily.     CBG (last 3)   Recent Labs  05/02/14 0709 05/02/14 0737 05/02/14 0805  GLUCAP 58* 92 129*  Essential hypertension -BP stable -Atenolol discontinued due to episode of bradycardia  Anemia of renal disease -Hgb 10.5 -Continue [place]-vit -Repeat CBC in a.m.  Metabolic bone disease -Due to ESRD -Continue Fosrenol  Leukocytosis -Resolved- likely due to discitis, cellulitis to   Anxiety Continue Klonopin QHS  Obesity BMI 36, follow with PCP         DVT Prophylaxis SCDs - Heparin Code Status: Full Family Communication:  No family at bedside Disposition Plan: SNF   Consultants:  ID  Nephrology  Cardiology  Neurosurgery Dr Verdene Lennert Surgery Dr Imogene Burn  IR  Orthopedics Dr. Lajoyce Corners    Procedures:  1.   Joint Aspiration 04/25/13  2. CT guided T-L spine aspiration 04-25-13 by IR  3.  Arteriogram By Vascular Dr Imogene Burn - 04-26-14  1. Right common femoral artery cannulation under ultrasound guidance 2. Placement of catheter in aorta 3. Aortogram 4. Left second order arterial selecgtion 5. Left leg runoff 6. Angioplasty and stenting of Left superficial femoral artery (5 mm x 30 mm self-expanding) 7. Right leg runoff    4.   Left fourth and fifth toe amputation scheduled for 05/04/2014 by Dr. Lajoyce Corners    Antibiotics:  Anti-infectives    Start     Dose/Rate Route Frequency Ordered Stop   05/01/14 1200  cefTAZidime (FORTAZ) 2 g in dextrose 5 % 50 mL IVPB  Status:  Discontinued     2 g100 mL/hr over 30 Minutes Intravenous Every T-Th-Sa (Hemodialysis) 04/29/14 1551 04/30/14 0947   04/26/14 1800  cefTAZidime (FORTAZ) 2 g in dextrose 5 % 50 mL IVPB  Status:  Discontinued     2 g100 mL/hr over 30 Minutes Intravenous Every T-Th-S-Su (1800) 04/25/14 1618 04/29/14 1551   04/26/14 1200  vancomycin (VANCOCIN) IVPB 1000 mg/200 mL premix     1,000 mg200 mL/hr over 60 Minutes Intravenous Every T-Th-Sa (Hemodialysis) 04/25/14 1618     04/25/14 2300  metroNIDAZOLE (FLAGYL) tablet 500 mg     500 mg Oral Every 8 hours 04/25/14 2208     04/25/14 1700  vancomycin (VANCOCIN) 2,000 mg in sodium chloride 0.9 % 500 mL IVPB     2,000 mg250 mL/hr over 120 Minutes Intravenous  Once 04/25/14 1618 04/25/14 1934   04/25/14 1700  cefTAZidime (FORTAZ) 2 g in dextrose 5 % 50 mL IVPB     2 g100 mL/hr over 30 Minutes Intravenous  Once 04/25/14 1618 04/25/14 1701   04/24/14 1200  vancomycin (VANCOCIN) IVPB 1000 mg/200 mL premix  Status:   Discontinued     1,000 mg200 mL/hr over 60 Minutes Intravenous Every T-Th-Sa (Hemodialysis) 04/21/14 2200 04/22/14 0855   04/22/14 2200  cefTRIAXone (ROCEPHIN) 1 g in dextrose 5 % 50 mL IVPB  Status:  Discontinued     1 g100 mL/hr over 30 Minutes Intravenous Every 24 hours 04/21/14 2211 04/22/14 0855   04/22/14 0645  vancomycin (VANCOCIN) 1,750 mg in sodium chloride 0.9 % 500 mL IVPB  Status:  Discontinued     1,750 mg250 mL/hr over 120 Minutes Intravenous  Once 04/22/14 0634 04/22/14 0855   04/21/14 2200  cefTRIAXone (ROCEPHIN) 2 g in dextrose 5 % 50 mL IVPB  Status:  Discontinued     2 g100 mL/hr over 30 Minutes Intravenous Every 24 hours 04/21/14 2157 04/22/14 0855       Subjective:  Patient in bed, denies any chest abdominal pain, no shortness of breath, does have some pain in the left fourth and fifth toes. No focal weakness.  Improved left fifth toe pain.    Objective: Filed Vitals:   05/02/14 0528  BP: 143/58  Pulse: 83  Temp: 99.3 F (37.4 C)  Resp: 16    Intake/Output Summary (Last 24 hours) at 05/02/14 0932 Last data filed at 05/02/14 0529  Gross per 24 hour  Intake    420 ml  Output   1750 ml  Net  -1330 ml   Filed Weights   04/28/14 0744 04/28/14 1148 04/28/14 2042  Weight: 86.6 kg (190 lb 14.7 oz) 84.6 kg (186 lb 8.2 oz) 83.1 kg (183 lb 3.2 oz)    Exam:  Gen: Alert Caucasian female in NAD. Chest: clear to auscultate bilaterally, no ronchi or rales  Cardiac: Regular rate and rhythm, holosystolic murmur  Abdomen: soft, non tender, non distended, +bowel sounds. No guarding or rigidity  Extremities: Symmetrical in appearance without edema.  Left 5th toe with necrotic changes Neurological: Alert awake oriented to time place and person.  Psychiatric: Appears normal.       Data Reviewed: Basic Metabolic Panel:  Recent Labs Lab 04/26/14 0828 04/27/14 0844 04/28/14 0758 05/01/14 1027 05/02/14 0517  NA 139 138 134* 135  --   K 5.3* 4.9 4.0 4.4  --     CL 92* 97 95* 95*  --   CO2 25 21 28 23   --   GLUCOSE 109* 247* 151* 94  --   BUN 28* 12 23 23   --   CREATININE 6.21* 3.96* 5.28* 6.92*  --   CALCIUM 10.1 8.8 9.2 9.4  --   MG  --   --   --   --  2.4  PHOS  --   --  4.9*  --   --    Liver Function Tests:  Recent Labs Lab 04/28/14 0758  ALBUMIN 2.7*   No results for input(s): LIPASE, AMYLASE in the last 168 hours. No results for input(s): AMMONIA in the last 168 hours. CBC:  Recent Labs Lab 04/26/14 0828 04/27/14 0844 04/28/14 0758 05/01/14 1027 05/02/14 0517  WBC 11.8* 9.0 8.4 8.3 10.0  HGB 10.7* 10.9* 9.7* 10.0* 11.0*  HCT 33.2* 35.2* 31.0* 32.4* 34.9*  MCV 88.5 91.2 90.4 90.0 92.1  PLT 208 104* 157 214 155   Cardiac Enzymes: No results for input(s): CKTOTAL, CKMB, CKMBINDEX, TROPONINI in the last 168 hours. BNP (last 3 results)  Recent Labs  07/10/13 1330  PROBNP 15005.0*   CBG:  Recent Labs Lab 05/01/14 2236 05/02/14 0641 05/02/14 0709 05/02/14 0737 05/02/14 0805  GLUCAP 127* 52* 58* 92 129*    Recent Results (from the past 240 hour(s))  Culture, routine-abscess     Status: None   Collection Time: 04/25/14 12:52 PM  Result Value Ref Range Status   Specimen Description ABSCESS  Final   Special Requests ABSC T10 T11 DISC  Final   Gram Stain   Final    FEW WBC PRESENT,BOTH PMN AND MONONUCLEAR NO SQUAMOUS EPITHELIAL CELLS SEEN NO ORGANISMS SEEN Performed at [facility]    Culture   Final    FEW STAPHYLOCOCCUS SPECIES (COAGULASE NEGATIVE) Note: RIFAMPIN AND GENTAMICIN SHOULD NOT BE USED AS SINGLE DRUGS FOR TREATMENT OF STAPH INFECTIONS. Performed at [facility]    Report Status 04/30/2014 FINAL  Final   Organism ID, Bacteria STAPHYLOCOCCUS SPECIES (COAGULASE NEGATIVE)  Final      Susceptibility   Staphylococcus species (coagulase negative) - MIC*    CLINDAMYCIN >=8 RESISTANT Resistant     ERYTHROMYCIN >=  8 RESISTANT Resistant     GENTAMICIN >=16 RESISTANT Resistant      LEVOFLOXACIN >=8 RESISTANT Resistant     OXACILLIN >=4 RESISTANT Resistant     PENICILLIN >=0.5 RESISTANT Resistant     RIFAMPIN <=0.5 SENSITIVE Sensitive     TRIMETH/SULFA >=320 RESISTANT Resistant     VANCOMYCIN 2 SENSITIVE Sensitive     TETRACYCLINE 2 SENSITIVE Sensitive     * FEW STAPHYLOCOCCUS SPECIES (COAGULASE NEGATIVE)     Studies: No results found.  Scheduled Meds: . antiseptic oral rinse  7 mL Mouth Rinse BID  . aspirin  81 mg Oral Daily  . clonazePAM  0.5 mg Oral QHS  . darbepoetin (ARANESP) injection - DIALYSIS  60 mcg Intravenous Q Tue-HD  . feeding supplement (NEPRO CARB STEADY)  237 mL Oral BID BM  . glucose-Vitamin C      . heparin subcutaneous  5,000 Units Subcutaneous 3 times per day  . insulin aspart  0-9 Units Subcutaneous TID WC  . insulin detemir  25 Units Subcutaneous QHS  . lanthanum  1,000 mg Oral TID WC  . lidocaine  20 mL Intradermal Once  . linagliptin  5 mg Oral Daily  . metroNIDAZOLE  500 mg Oral Q8H  . multivitamin  1 tablet Oral QHS  . mupirocin cream   Topical BID  . pantoprazole  40 mg Oral Daily  . polyethylene glycol  17 g Oral BID  . vancomycin  1,000 mg Intravenous Q T,Th,Sa-HD   Continuous Infusions:   Principal Problem:   Discitis of lumbar region Active Problems:   Hyponatremia   Diabetes mellitus   HTN (hypertension)   ESRD on dialysis   Diskitis   Anemia of renal disease   Elevated troponin-(0.27)   Bradycardia-transient, resolved   Diabetic foot ulcer   Diabetic infection of left foot   Necrotic toes    Time spent: 9345    Shaunae Sieloff K M.D on 05/02/2014 at 9:32 AM  Between 7am to 7pm - Pager - [phone], After 7pm go to [email or web address] - password [email or web address]  And look for the night coverage person covering me after hours  [facility]  Office  [phone]

## 2014-05-02 NOTE — Progress Notes (Signed)
Subjective:  Tolerated HD yesterday using AVF 4th time in recliner but needed lots of  assistance  to get in chair sec weakness/ noted Dr. Lajoyce Cornersuda plans 4th and 5ht   toe amp  This Friday  Objective Vital signs in last 24 hours: Filed Vitals:   05/01/14 1400 05/01/14 2002 05/01/14 2249 05/02/14 0528  BP: 161/66 129/55 135/63 143/58  Pulse: 82 80 88 83  Temp: 98.6 F (37 C) 99.1 F (37.3 C)  99.3 F (37.4 C)  TempSrc: Oral Oral  Oral  Resp: 20 18 17 16   Height:      Weight:      SpO2: 100% 99% 100% 100%   Weight change:  Physical Exam: General: Alert, NAD Resp: CTA bilat Cardio: RRR no murmur or rub GI: obese, + BS, soft and nontender Extremities: Dressing on L foot ot removed/ no Pedal edema Access: AVF @ [facility] with BFR 400, also L IJ catheter  HD: [facility] 85 kgs 2K/2ca 4 hr 400/800 6200 Heparin. L IJ cath and R AVF Calcitriol 0.75 Aranesp 25 q week.  Problem/Plan: 1. Lower extremity weakness / falls at home - due to discitis/ cord compression and ischemic left leg. Improving symptoms but deconditioned /tolerated recliner HD yest .needs PT And at Dc?? In hosp rehab vs NHP  2. Ischemic L lower extremity - s/p L SFA stent Dr. Imogene Burnhen  3. L foot gangrene - Dr. Lajoyce Cornersuda plans 4th, 5th toe amp Friday 4. Discitis - T10/11-on Vancomycin ID following and .recommends 6 wk of vancomycin using  day of removal of current HD catheter as day #1 of antibiotics of 42. As DW DR. Keeton Kassebaum  Removal of perm cath  After toe amp surgery -"use for central line in surgery"  5. ESRD - HD on [facility] @ [facility], hd in am/ used AVF 4th time yesterday, p.cath out as above post fri surgery 6. Dialysis access - Using AVF @ [facility] successfully with BFR 400. Plan is to have tunneled HD cath removed this week will contact [facility] to remove 7. Hx MRSA cath sepsis Aug 2015 -  8. HTN/Volume - BP's 143/53 good, under 83 last record 04/28/14 no pedal edema/ needing wts pre/post hd 9. Anemia - Hgb 9.7>11.0   on Aranesp 7760mcg/ fu trend  10. Sec HPT - Ca 9.4 (10.4 corrected), P 4.9; Calcitriol on hold, Fosrenol 1 g with meals. 11. Nutrition - Alb 2.7, cab-mod renal diet, vitamin. Wants to try breeze supplement instead of nepro 12. DM - insulin per primary.  Katherine Pastelavid Zeyfang, [name] [facility] Beeper [phone] 05/02/2014,9:45 AM  LOS: 11 days   Labs: Basic Metabolic Panel:  Recent Labs Lab 04/27/14 0844 04/28/14 0758 05/01/14 1027  NA 138 134* 135  K 4.9 4.0 4.4  CL 97 95* 95*  CO2 21 28 23   GLUCOSE 247* 151* 94  BUN 12 23 23   CREATININE 3.96* 5.28* 6.92*  CALCIUM 8.8 9.2 9.4  PHOS  --  4.9*  --    Liver Function Tests:  Recent Labs Lab 04/28/14 0758  ALBUMIN 2.7*  CBC:  Recent Labs Lab 04/26/14 0828 04/27/14 0844 04/28/14 0758 05/01/14 1027 05/02/14 0517  WBC 11.8* 9.0 8.4 8.3 10.0  HGB 10.7* 10.9* 9.7* 10.0* 11.0*  HCT 33.2* 35.2* 31.0* 32.4* 34.9*  MCV 88.5 91.2 90.4 90.0 92.1  PLT 208 104* 157 214 155  CBG:  Recent Labs Lab 05/01/14 2236 05/02/14 0641 05/02/14 0709 05/02/14 0737 05/02/14 0805  GLUCAP 127* 52* 58* 92 129*  Studies/Results: No results found. Medications:   . antiseptic oral rinse  7 mL Mouth Rinse BID  . aspirin  81 mg Oral Daily  . clonazePAM  0.5 mg Oral QHS  . darbepoetin (ARANESP) injection - DIALYSIS  60 mcg Intravenous Q Tue-HD  . feeding supplement (NEPRO CARB STEADY)  237 mL Oral BID BM  . glucose-Vitamin C      . heparin subcutaneous  5,000 Units Subcutaneous 3 times per day  . insulin aspart  0-9 Units Subcutaneous TID WC  . insulin detemir  25 Units Subcutaneous QHS  . lanthanum  1,000 mg Oral TID WC  . lidocaine  20 mL Intradermal Once  . linagliptin  5 mg Oral Daily  . metroNIDAZOLE  500 mg Oral Q8H  . multivitamin  1 tablet Oral QHS  . mupirocin cream   Topical BID  . pantoprazole  40 mg Oral Daily  . polyethylene glycol  17 g Oral BID  . vancomycin  1,000 mg Intravenous Q T,Th,Sa-HD    I have  seen and examined this patient and agree with plan as outlined by Katherine Pastel, [name].  For amputations on 05/04/14 and would remove [facility] following surgery as long as she remains hemodynamically stable. Ziad Maye A,MD 05/02/2014 12:57 PM

## 2014-05-02 NOTE — Progress Notes (Signed)
05/02/2014 6:43 AM  Hypoglycemic Event  CBG: 52  Treatment: 15 GM carbohydrate snack /orange juice  Symptoms: None  Follow-up CBG: Time:0658 CBG Result:58  Possible Reasons for Event: Unknown  Comments: hypoglycemic protocol initiated. Will continue to monitor      0715- 2 glucose tabs given  Follow up CBG time: 0730                   CBG result:92       Charlotte Brafford M  Remember to initiate Hypoglycemia Order Set & complete

## 2014-05-02 NOTE — Progress Notes (Signed)
Inpatient Diabetes Program Recommendations  AACE/ADA: New Consensus Statement on Inpatient Glycemic Control (2013)  Target Ranges:  Prepandial:   less than 140 mg/dL      Peak postprandial:   less than 180 mg/dL (1-2 hours)      Critically ill patients:  140 - 180 mg/dL   Reason for Assessment:   Results for [name], [name] (MRN [id number]) as of 05/02/2014 12:26  Ref. Range 05/02/2014 06:41 05/02/2014 07:09 05/02/2014 07:37 05/02/2014 08:05 05/02/2014 11:23  Glucose-Capillary Latest Range: 70-99 mg/dL 52 (L) 58 (L) 92 409129 (H) 171 (H)   Note that CBG was low this morning after receiving Levemir 25 units last PM.   Consider reducing Levemir to 18 units q HS.    Thanks, Beryl MeagerJenny Dominic Mahaney, RN, BC-ADM Inpatient Diabetes Coordinator Pager [phone]

## 2014-05-02 NOTE — Progress Notes (Signed)
Patient ID: [name], female   DOB: 07/08/1960, 53 y.o.   MRN: [id number] Plan for surgery Friday for amputation fourth and fifth toes left foot. Nothing by mouth after midnight Thursday 

## 2014-05-03 LAB — RENAL FUNCTION PANEL
Albumin: 2.5 g/dL — ABNORMAL LOW (ref 3.5–5.2)
Anion gap: 14 (ref 5–15)
BUN: 12 mg/dL (ref 6–23)
CALCIUM: 9 mg/dL (ref 8.4–10.5)
CO2: 26 mmol/L (ref 19–32)
Chloride: 91 mEq/L — ABNORMAL LOW (ref 96–112)
Creatinine, Ser: 5.68 mg/dL — ABNORMAL HIGH (ref 0.50–1.10)
GFR calc non Af Amer: 8 mL/min — ABNORMAL LOW (ref >90)
GFR, EST AFRICAN AMERICAN: 9 mL/min — AB (ref >90)
Glucose, Bld: 394 mg/dL — ABNORMAL HIGH (ref 70–99)
POTASSIUM: 3.6 mmol/L (ref 3.5–5.1)
Phosphorus: 4.8 mg/dL — ABNORMAL HIGH (ref 2.3–4.6)
SODIUM: 131 mmol/L — AB (ref 135–145)

## 2014-05-03 LAB — GLUCOSE, RANDOM: Glucose, Bld: 445 mg/dL — ABNORMAL HIGH (ref 70–99)

## 2014-05-03 LAB — CBC
HEMATOCRIT: 31.6 % — AB (ref 36.0–46.0)
HEMOGLOBIN: 9.7 g/dL — AB (ref 12.0–15.0)
MCH: 28 pg (ref 26.0–34.0)
MCHC: 30.7 g/dL (ref 30.0–36.0)
MCV: 91.3 fL (ref 78.0–100.0)
PLATELETS: 188 10*3/uL (ref 150–400)
RBC: 3.46 MIL/uL — ABNORMAL LOW (ref 3.87–5.11)
RDW: 20.4 % — ABNORMAL HIGH (ref 11.5–15.5)
WBC: 8.4 10*3/uL (ref 4.0–10.5)

## 2014-05-03 LAB — GLUCOSE, CAPILLARY
GLUCOSE-CAPILLARY: 141 mg/dL — AB (ref 70–99)
Glucose-Capillary: 102 mg/dL — ABNORMAL HIGH (ref 70–99)
Glucose-Capillary: 262 mg/dL — ABNORMAL HIGH (ref 70–99)
Glucose-Capillary: 330 mg/dL — ABNORMAL HIGH (ref 70–99)
Glucose-Capillary: 367 mg/dL — ABNORMAL HIGH (ref 70–99)
Glucose-Capillary: 444 mg/dL — ABNORMAL HIGH (ref 70–99)

## 2014-05-03 LAB — VANCOMYCIN, RANDOM: VANCOMYCIN RM: 20 ug/mL

## 2014-05-03 MED ORDER — INSULIN ASPART 100 UNIT/ML ~~LOC~~ SOLN
6.0000 [IU] | Freq: Once | SUBCUTANEOUS | Status: AC
  Administered 2014-05-03: 6 [IU] via SUBCUTANEOUS

## 2014-05-03 MED ORDER — ONDANSETRON HCL 4 MG/2ML IJ SOLN
INTRAMUSCULAR | Status: AC
  Filled 2014-05-03: qty 2

## 2014-05-03 MED ORDER — ONDANSETRON HCL 4 MG/2ML IJ SOLN
4.0000 mg | INTRAMUSCULAR | Status: DC | PRN
  Administered 2014-05-03 – 2014-05-04 (×5): 4 mg via INTRAVENOUS
  Filled 2014-05-03 (×2): qty 2

## 2014-05-03 MED ORDER — INSULIN ASPART 100 UNIT/ML ~~LOC~~ SOLN: 6.0000 [IU] | Freq: Once | SUBCUTANEOUS | Status: AC

## 2014-05-03 MED ORDER — VANCOMYCIN HCL IN DEXTROSE 1-5 GM/200ML-% IV SOLN
1000.0000 mg | INTRAVENOUS | Status: DC
  Administered 2014-05-05: 1000 mg via INTRAVENOUS
  Filled 2014-05-03 (×2): qty 200

## 2014-05-03 MED ORDER — VANCOMYCIN HCL 1000 MG IV SOLR
1250.0000 mg | INTRAVENOUS | Status: AC
  Administered 2014-05-03: 1250 mg via INTRAVENOUS
  Filled 2014-05-03: qty 1250

## 2014-05-03 MED ORDER — HYDROMORPHONE HCL 1 MG/ML IJ SOLN
INTRAMUSCULAR | Status: AC
  Filled 2014-05-03: qty 1

## 2014-05-03 MED ORDER — OXYCODONE HCL 5 MG PO TABS
ORAL_TABLET | ORAL | Status: AC
  Filled 2014-05-03: qty 1

## 2014-05-03 MED ORDER — OXYCODONE-ACETAMINOPHEN 5-325 MG PO TABS
ORAL_TABLET | ORAL | Status: AC
  Filled 2014-05-03: qty 1

## 2014-05-03 NOTE — Procedures (Signed)
Patient was seen on dialysis and the procedure was supervised. BFR 400 Via RAVF BP is 103/51.  Patient appears to be tolerating treatment well and is in good spirits.

## 2014-05-03 NOTE — Progress Notes (Signed)
PT Cancellation Note  Patient Details Name: [name] MRN: [id number] DOB: 07/21/1960   Cancelled Treatment:    Reason Eval/Treat Not Completed: Pain limiting ability to participate.  Plans surgery in afternoon tomorrow and will try in AM.   Ivar DrapeStout, Aubryana Vittorio E 05/03/2014, 4:39 PM   Samul Dadauth Behr Cislo, PT MS Acute Rehab Dept. Number: [phone]

## 2014-05-03 NOTE — Progress Notes (Addendum)
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 Codeine Other (See Comments)    hallucinations    Patient Measurements: Height:  (154.9 cm) Weight: 187 lb 9.8 oz (85.1 kg) IBW/kg (Calculated) : 47.8  Vital Signs: Temp: 98.9 F (37.2 C) (01/21 1400) Temp Source: Axillary (01/21 1125) BP: 142/64 mmHg (01/21 1400) Pulse Rate: 89 (01/21 1400) Intake/Output from previous day: 01/20 0701 - 01/21 0700 In: 50 [P.O.:50] Out: -  Intake/Output from this shift:    Labs:  Recent Labs  05/01/14 1027 05/02/14 0517 05/03/14 0727  WBC 8.3 10.0 8.4  HGB 10.0* 11.0* 9.7*  PLT 214 155 188  CREATININE 6.92*  --  5.68*   Estimated Creatinine Clearance: 11.2 mL/min (by C-G formula based on Cr of 5.68).  Recent Labs  05/03/14 1938  VANCORANDOM 20.0     Microbiology: Recent Results (from the past 720 hour(s))  Blood culture (routine x 2)     Status: None   Collection Time: 04/21/14  8:36 PM  Result Value Ref Range Status   Specimen Description BLOOD LEFT HAND  Final   Special Requests BOTTLES DRAWN AEROBIC AND ANAEROBIC 3CC  Final   Culture   Final    NO GROWTH 5 DAYS Performed at [facility]    Report Status 04/28/2014 FINAL  Final  Blood culture (routine x 2)     Status: None   Collection Time: 04/21/14  9:37 PM  Result Value Ref Range Status   Specimen Description BLOOD LEFT ARM  Final   Special Requests BOTTLES DRAWN AEROBIC ONLY 3CC  Final   Culture   Final    NO GROWTH 5 DAYS Performed at [facility]    Report Status 04/28/2014 FINAL  Final  Culture, routine-abscess     Status: None   Collection Time: 04/25/14 12:52 PM  Result Value Ref Range Status   Specimen Description ABSCESS  Final   Special Requests ABSC T10 T11 DISC  Final   Gram Stain   Final    FEW WBC PRESENT,BOTH PMN AND MONONUCLEAR NO SQUAMOUS EPITHELIAL CELLS SEEN NO ORGANISMS  SEEN Performed at [facility]    Culture   Final    FEW STAPHYLOCOCCUS SPECIES (COAGULASE NEGATIVE) Note: RIFAMPIN AND GENTAMICIN SHOULD NOT BE USED AS SINGLE DRUGS FOR TREATMENT OF STAPH INFECTIONS. Performed at [facility]    Report Status 04/30/2014 FINAL  Final   Organism ID, Bacteria STAPHYLOCOCCUS SPECIES (COAGULASE NEGATIVE)  Final      Susceptibility   Staphylococcus species (coagulase negative) - MIC*    CLINDAMYCIN >=8 RESISTANT Resistant     ERYTHROMYCIN >=8 RESISTANT Resistant     GENTAMICIN >=16 RESISTANT Resistant     LEVOFLOXACIN >=8 RESISTANT Resistant     OXACILLIN >=4 RESISTANT Resistant     PENICILLIN >=0.5 RESISTANT Resistant     RIFAMPIN <=0.5 SENSITIVE Sensitive     TRIMETH/SULFA >=320 RESISTANT Resistant     VANCOMYCIN 2 SENSITIVE Sensitive     TETRACYCLINE 2 SENSITIVE Sensitive     * FEW STAPHYLOCOCCUS SPECIES (COAGULASE NEGATIVE)  Surgical pcr screen     Status: None   Collection Time: 05/02/14  3:54 PM  Result Value Ref Range Status   MRSA, PCR NEGATIVE NEGATIVE Final   Staphylococcus aureus NEGATIVE NEGATIVE Final    Comment:        The Xpert SA Assay (FDA approved for NASAL  specimens in patients over 55 years of age), is one component of a comprehensive surveillance program.  Test performance has been validated by [facility] for patients greater than or equal to 55 year old. It is not intended to diagnose infection nor to guide or monitor treatment.     Anti-infectives    Start     Dose/Rate Route Frequency Ordered Stop   05/05/14 1200  vancomycin (VANCOCIN) IVPB 1000 mg/200 mL premix     1,000 mg200 mL/hr over 60 Minutes Intravenous Every T-Th-Sa (Hemodialysis) 05/03/14 0844     05/04/14 0600  ceFAZolin (ANCEF) IVPB 2 g/50 mL premix     2 g100 mL/hr over 30 Minutes Intravenous On call to O.R. 05/02/14 1545 05/05/14 0559   05/03/14 1200  vancomycin (VANCOCIN) 1,250 mg in sodium chloride 0.9 % 250 mL IVPB     1,250 mg250  mL/hr over 60 Minutes Intravenous Every Thu (Hemodialysis) 05/03/14 0844 05/03/14 1300   05/01/14 1200  cefTAZidime (FORTAZ) 2 g in dextrose 5 % 50 mL IVPB  Status:  Discontinued     2 g100 mL/hr over 30 Minutes Intravenous Every T-Th-Sa (Hemodialysis) 04/29/14 1551 04/30/14 0947   04/26/14 1800  cefTAZidime (FORTAZ) 2 g in dextrose 5 % 50 mL IVPB  Status:  Discontinued     2 g100 mL/hr over 30 Minutes Intravenous Every T-Th-S-Su (1800) 04/25/14 1618 04/29/14 1551   04/26/14 1200  vancomycin (VANCOCIN) IVPB 1000 mg/200 mL premix  Status:  Discontinued     1,000 mg200 mL/hr over 60 Minutes Intravenous Every T-Th-Sa (Hemodialysis) 04/25/14 1618 05/03/14 0844   04/25/14 2300  metroNIDAZOLE (FLAGYL) tablet 500 mg  Status:  Discontinued     500 mg Oral Every 8 hours 04/25/14 2208 05/02/14 1056   04/25/14 1700  vancomycin (VANCOCIN) 2,000 mg in sodium chloride 0.9 % 500 mL IVPB     2,000 mg250 mL/hr over 120 Minutes Intravenous  Once 04/25/14 1618 04/25/14 1934   04/25/14 1700  cefTAZidime (FORTAZ) 2 g in dextrose 5 % 50 mL IVPB     2 g100 mL/hr over 30 Minutes Intravenous  Once 04/25/14 1618 04/25/14 1701   04/24/14 1200  vancomycin (VANCOCIN) IVPB 1000 mg/200 mL premix  Status:  Discontinued     1,000 mg200 mL/hr over 60 Minutes Intravenous Every T-Th-Sa (Hemodialysis) 04/21/14 2200 04/22/14 0855   04/22/14 2200  cefTRIAXone (ROCEPHIN) 1 g in dextrose 5 % 50 mL IVPB  Status:  Discontinued     1 g100 mL/hr over 30 Minutes Intravenous Every 24 hours 04/21/14 2211 04/22/14 0855   04/22/14 0645  vancomycin (VANCOCIN) 1,750 mg in sodium chloride 0.9 % 500 mL IVPB  Status:  Discontinued     1,750 mg250 mL/hr over 120 Minutes Intravenous  Once 04/22/14 0634 04/22/14 0855   04/21/14 2200  cefTRIAXone (ROCEPHIN) 2 g in dextrose 5 % 50 mL IVPB  Status:  Discontinued     2 g100 mL/hr over 30 Minutes Intravenous Every 24 hours 04/21/14 2157 04/22/14 0855      Assessment: 53 YOF with ESRD who continues on  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 The patient was loaded with Vancomycin on 1/13. The patient received HD sessions and tolerated them well (4 hr BFR 400) on 1/14, 1/16, & 1/19. Vanc maintenance doses were given on 1/14 & 1/16 however were missed on 1/19. Since the patient's MIC to Vancomycin is 2,  it is important to keep Vancomycin levels on the higher end of the therapeutic range (20-25 mcg/ml).   -A vancomycin random was 20 mcg/ml at 7:38pm today (HD today was from about 7am-11am at 400 BFR) vancomycin  given at 1200.  Goal of Therapy:  Pre-HD Vancomycin level of 20-25 mcg/ml (higher goal range d/t Vanc MIC of 2)  Plan:  -No additional vancomycin needed today -Continue vancomycin  IV post HD on TThSa -Will follow HD schedule, cultures and clinical progress  Harland German, Pharm D 05/03/2014 9:12 PM

## 2014-05-03 NOTE — Progress Notes (Signed)
HS CBG check is 444. STAT glucose lab order per protocol. [facility] hospitalists paged. Received call back PA Lenny Pastelom Callahan. Orders placed to give 6 units of novolog now & to give already scheduled 20 units of levemir. Nursing will continue to follow.

## 2014-05-03 NOTE — Progress Notes (Signed)
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 Codeine Other (See Comments)    hallucinations    Patient Measurements: Height:  (154.9 cm) Weight: 190 lb 4.1 oz (86.3 kg) IBW/kg (Calculated) : 47.8  Vital Signs: Temp: 98.8 F (37.1 C) (01/21 0717) Temp Source: Oral (01/21 0730) BP: 108/59 mmHg (01/21 0800) Pulse Rate: 83 (01/21 0800) Intake/Output from previous day: 01/20 0701 - 01/21 0700 In: 50 [P.O.:50] Out: -  Intake/Output from this shift:    Labs:  Recent Labs  05/01/14 1027 05/02/14 0517 05/03/14 0727  WBC 8.3 10.0 8.4  HGB 10.0* 11.0* 9.7*  PLT 214 155 188  CREATININE 6.92*  --   --    Estimated Creatinine Clearance: 9.3 mL/min (by C-G formula based on Cr of 6.92). No results for input(s): VANCOTROUGH, VANCOPEAK, VANCORANDOM, GENTTROUGH, GENTPEAK, GENTRANDOM, TOBRATROUGH, TOBRAPEAK, TOBRARND, AMIKACINPEAK, AMIKACINTROU, AMIKACIN in the last 72 hours.   Microbiology: Recent Results (from the past 720 hour(s))  Blood culture (routine x 2)     Status: None   Collection Time: 04/21/14  8:36 PM  Result Value Ref Range Status   Specimen Description BLOOD LEFT HAND  Final   Special Requests BOTTLES DRAWN AEROBIC AND ANAEROBIC 3CC  Final   Culture   Final    NO GROWTH 5 DAYS Performed at [facility]    Report Status 04/28/2014 FINAL  Final  Blood culture (routine x 2)     Status: None   Collection Time: 04/21/14  9:37 PM  Result Value Ref Range Status   Specimen Description BLOOD LEFT ARM  Final   Special Requests BOTTLES DRAWN AEROBIC ONLY 3CC  Final   Culture   Final    NO GROWTH 5 DAYS Performed at [facility]    Report Status 04/28/2014 FINAL  Final  Culture, routine-abscess     Status: None   Collection Time: 04/25/14 12:52 PM  Result Value Ref Range Status   Specimen Description ABSCESS  Final   Special Requests ABSC T10  T11 DISC  Final   Gram Stain   Final    FEW WBC PRESENT,BOTH PMN AND MONONUCLEAR NO SQUAMOUS EPITHELIAL CELLS SEEN NO ORGANISMS SEEN Performed at [facility]    Culture   Final    FEW STAPHYLOCOCCUS SPECIES (COAGULASE NEGATIVE) Note: RIFAMPIN AND GENTAMICIN SHOULD NOT BE USED AS SINGLE DRUGS FOR TREATMENT OF STAPH INFECTIONS. Performed at [facility]    Report Status 04/30/2014 FINAL  Final   Organism ID, Bacteria STAPHYLOCOCCUS SPECIES (COAGULASE NEGATIVE)  Final      Susceptibility   Staphylococcus species (coagulase negative) - MIC*    CLINDAMYCIN >=8 RESISTANT Resistant     ERYTHROMYCIN >=8 RESISTANT Resistant     GENTAMICIN >=16 RESISTANT Resistant     LEVOFLOXACIN >=8 RESISTANT Resistant     OXACILLIN >=4 RESISTANT Resistant     PENICILLIN >=0.5 RESISTANT Resistant     RIFAMPIN <=0.5 SENSITIVE Sensitive     TRIMETH/SULFA >=320 RESISTANT Resistant     VANCOMYCIN 2 SENSITIVE Sensitive     TETRACYCLINE 2 SENSITIVE Sensitive     * FEW STAPHYLOCOCCUS SPECIES (COAGULASE NEGATIVE)  Surgical pcr screen     Status: None   Collection Time: 05/02/14  3:54 PM  Result Value Ref Range Status   MRSA, PCR NEGATIVE NEGATIVE Final   Staphylococcus aureus NEGATIVE NEGATIVE Final    Comment:  The Xpert SA Assay (FDA approved for NASAL specimens in patients over 55 years of age), is one component of a comprehensive surveillance program.  Test performance has been validated by [facility] for patients greater than or equal to 55 year old. It is not intended to diagnose infection nor to guide or monitor treatment.     Anti-infectives    Start     Dose/Rate Route Frequency Ordered Stop   05/05/14 1200  vancomycin (VANCOCIN) IVPB 1000 mg/200 mL premix     1,000 mg200 mL/hr over 60 Minutes Intravenous Every T-Th-Sa (Hemodialysis) 05/03/14 0844     05/04/14 0600  ceFAZolin (ANCEF) IVPB 2 g/50 mL premix     2 g100 mL/hr over 30 Minutes Intravenous On call to O.R.  05/02/14 1545 05/05/14 0559   05/03/14 1200  vancomycin (VANCOCIN) 1,250 mg in sodium chloride 0.9 % 250 mL IVPB     1,250 mg250 mL/hr over 60 Minutes Intravenous Every Thu (Hemodialysis) 05/03/14 0844 05/10/14 1159   05/01/14 1200  cefTAZidime (FORTAZ) 2 g in dextrose 5 % 50 mL IVPB  Status:  Discontinued     2 g100 mL/hr over 30 Minutes Intravenous Every T-Th-Sa (Hemodialysis) 04/29/14 1551 04/30/14 0947   04/26/14 1800  cefTAZidime (FORTAZ) 2 g in dextrose 5 % 50 mL IVPB  Status:  Discontinued     2 g100 mL/hr over 30 Minutes Intravenous Every T-Th-S-Su (1800) 04/25/14 1618 04/29/14 1551   04/26/14 1200  vancomycin (VANCOCIN) IVPB 1000 mg/200 mL premix  Status:  Discontinued     1,000 mg200 mL/hr over 60 Minutes Intravenous Every T-Th-Sa (Hemodialysis) 04/25/14 1618 05/03/14 0844   04/25/14 2300  metroNIDAZOLE (FLAGYL) tablet 500 mg  Status:  Discontinued     500 mg Oral Every 8 hours 04/25/14 2208 05/02/14 1056   04/25/14 1700  vancomycin (VANCOCIN) 2,000 mg in sodium chloride 0.9 % 500 mL IVPB     2,000 mg250 mL/hr over 120 Minutes Intravenous  Once 04/25/14 1618 04/25/14 1934   04/25/14 1700  cefTAZidime (FORTAZ) 2 g in dextrose 5 % 50 mL IVPB     2 g100 mL/hr over 30 Minutes Intravenous  Once 04/25/14 1618 04/25/14 1701   04/24/14 1200  vancomycin (VANCOCIN) IVPB 1000 mg/200 mL premix  Status:  Discontinued     1,000 mg200 mL/hr over 60 Minutes Intravenous Every T-Th-Sa (Hemodialysis) 04/21/14 2200 04/22/14 0855   04/22/14 2200  cefTRIAXone (ROCEPHIN) 1 g in dextrose 5 % 50 mL IVPB  Status:  Discontinued     1 g100 mL/hr over 30 Minutes Intravenous Every 24 hours 04/21/14 2211 04/22/14 0855   04/22/14 0645  vancomycin (VANCOCIN) 1,750 mg in sodium chloride 0.9 % 500 mL IVPB  Status:  Discontinued     1,750 mg250 mL/hr over 120 Minutes Intravenous  Once 04/22/14 0634 04/22/14 0855   04/21/14 2200  cefTRIAXone (ROCEPHIN) 2 g in dextrose 5 % 50 mL IVPB  Status:  Discontinued     2 g100  mL/hr over 30 Minutes Intravenous Every 24 hours 04/21/14 2157 04/22/14 0855      Assessment: 53 YOF with ESRD who 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 The patient was loaded with Vancomycin on 1/13. The patient received HD sessions and tolerated them well (4 hr BFR 400) on 1/14, 1/16, & 1/19. Vanc maintenance doses were given on 1/14 & 1/16 however were missed on 1/19.  Since the patient's MIC to Vancomycin is 2, it is important to keep Vancomycin levels on the higher end of the therapeutic range (20-25 mcg/ml). Given the patient's calculated kinetics and estimated Vancomycin levels, will give a slightly higher Vancomycin dose after dialysis today with plans to check a Vancomycin random this evening to help guide further doses.   Goal of Therapy:  Pre-HD Vancomycin level of 20-25 mcg/ml (higher goal range d/t Vanc MIC of 2)  Plan:  1. Vancomycin 1250 mg post HD today 2. Will obtain a Vancomycin random this evening to determine if any additional Vancomycin should be given 3. In the meantime, will plan to continue Vancomycin 1g post HD on T/Th/Sat 4. Will continue to follow HD schedule/duration, culture results, LOT, and antibiotic de-escalation plans   Georgina Pillion, PharmD, BCPS Clinical Pharmacist Pager: [phone] 05/03/2014 8:52 AM

## 2014-05-03 NOTE — Progress Notes (Signed)
[facility] PROGRESS NOTE  RUCHY WILDRICK XWR:[id number] DOB: 26-Aug-1960 DOA: 04/21/2014 PCP: Pearla Dubonnet, MD  Summary  [name] is a 54 y.o. female with a history of ESRD on HD, HTN, IDDM, Lumbar DDD who presents to the ED with complaints of progressive weakness over the past month in both legs and her knee buckling and precipitating a fall today. She was not able to get up and was brought to the ED. She also had an injury to her Left 5th Toe 1 month ago and she saw her PCP, and over the month she developed increased redness so her dialysis doctor placed over on IV Vancomycin to treat a cellulitis with her dialysis Rx 4 days ago. In the ED tonight, An X-Ray of the Left Foot was performed and was negative for fracture and findings of Osteomyelitis, and an MRI of the Lumbar Spine which revealed +diskitis/Osteomyelitis of the T10-T11 disk. NeuroSurgery Dr. Tressie Stalker was consulted and patient was placed on IV Vancomycin and Rocephin and referred for medical admission.   As per Neurosurgery no surgery and continue IV antibiotics. Based on CT guided aspiration data she will be placed on 6 weeks of vancomycin during her dialysis runs. ID on board. Also being followed by renal for dialysis, she was seen by IR for CT-guided disc aspiration. Cardiology had also seen the patient this admission.  She also developed worsening pain in her left fourth and fifth toe which was suspicious for ischemic etiology, vascular surgery was consulted and she underwent arteriogram with a L Fem stent placement, however she had distal disease as well which was not surgically repairable as she was thought to be a poor candidate for bypass. She is due for left fourth and fifth toe amputation by Dr. Lajoyce Corners on 05/04/2014. Thereafter her left temporary dialysis catheter will be removed by IR as she has a functioning right arm fistula for dialysis.   Assessment/Plan:  Discitis  -MRI of lumbar  spine evidence of discitis/osteomyelitis of T10-T11 disc, status post CT-guided aspiration. Growing coag-negative staph. Per ID she will get 7 weeks of IV vancomycin during dialysis runs, day 1 will be counted once her temporary dialysis catheter is removed which is planned after her toe removal surgery on 05/04/2014.   Seen by neurosurgery this admission Dr. Lovell Sheehan. Per Dr. Lovell Sheehan no surgical intervention needed.   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 -Consulted Dr. Lajoyce Corners is due for left fourth and fifth toe amputation on 05/04/2014.   Elevated troponin - Chest pain-free, non-ACS pattern troponin trend. Seen by cardiology no further intervention continue present medical care which includes aspirin, beta blocker due to bradycardia earlier in admission.    ESRD on dialysis -Nephrology on board -Receives HD T, R, Sat.    -Removed temporary left IJ dialysis catheter after toe amputation surgery. She has a functioning right arm AV fistula.   Mobitz type I heart block/bradycardia -Remains asymptomatic, 2D echo 04/2014- EF 55-60%, mild LVH, mild AS, a blocker stopped. Repeat EKG stable.    Diabetes mellitus -Hemoglobin A1c 9.5, Levemir dose adjusted as nighttime sugars were slightly low, continue SSI,  and Tradjental 5mg  daily.     CBG (last 3)   Recent Labs  05/02/14 1613 05/02/14 2124 05/03/14 0646  GLUCAP 214* 368*  330*     Essential hypertension -BP stable -Atenolol discontinued due to episode of bradycardia   Anemia of renal disease -Hgb 10.5 -Continue Reno-vit -Repeat CBC in a.m.   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 Lajoyce Cornersuda    Procedures:  1.   Joint Aspiration 04/25/13  2. CT guided T-L spine aspiration 04-25-13 by IR  3.  Arteriogram By Vascular Dr Imogene Burnhen - 04-26-14  1. Right common femoral artery cannulation under ultrasound guidance 2. Placement of catheter in aorta 3. Aortogram 4. Left second order arterial selecgtion 5. Left leg runoff 6. Angioplasty and stenting of Left superficial femoral artery (5 mm x 30 mm self-expanding) 7. Right leg runoff    4.   Left fourth and fifth toe amputation scheduled for 05/04/2014 by Dr. Lajoyce Cornersuda    Antibiotics:  Anti-infectives    Start     Dose/Rate Route Frequency Ordered Stop   05/05/14 1200  vancomycin (VANCOCIN) IVPB 1000 mg/200 mL premix     1,000 mg200 mL/hr over 60 Minutes Intravenous Every T-Th-Sa (Hemodialysis) 05/03/14 0844     05/04/14 0600  ceFAZolin (ANCEF) IVPB 2 g/50 mL premix     2 g100 mL/hr over 30 Minutes Intravenous On call to O.R. 05/02/14 1545 05/05/14 0559   05/03/14 1200  vancomycin (VANCOCIN) 1,250 mg in sodium chloride 0.9 % 250 mL IVPB     1,250 mg250 mL/hr over 60 Minutes Intravenous Every Thu (Hemodialysis) 05/03/14 0844 05/10/14 1159   05/01/14 1200  cefTAZidime (FORTAZ) 2 g in dextrose 5 % 50 mL IVPB  Status:  Discontinued     2 g100 mL/hr over 30 Minutes Intravenous Every T-Th-Sa (Hemodialysis) 04/29/14 1551 04/30/14 0947   04/26/14 1800  cefTAZidime (FORTAZ) 2 g in dextrose 5 % 50 mL IVPB  Status:  Discontinued     2 g100 mL/hr over 30 Minutes Intravenous Every T-Th-S-Su (1800) 04/25/14 1618 04/29/14 1551   04/26/14 1200  vancomycin (VANCOCIN) IVPB 1000 mg/200 mL premix  Status:  Discontinued     1,000 mg200 mL/hr over 60 Minutes Intravenous Every  T-Th-Sa (Hemodialysis) 04/25/14 1618 05/03/14 0844   04/25/14 2300  metroNIDAZOLE (FLAGYL) tablet 500 mg  Status:  Discontinued     500 mg Oral Every 8 hours 04/25/14 2208 05/02/14 1056   04/25/14 1700  vancomycin (VANCOCIN) 2,000 mg in sodium chloride 0.9 % 500 mL IVPB     2,000 mg250 mL/hr over 120 Minutes Intravenous  Once 04/25/14 1618 04/25/14 1934   04/25/14 1700  cefTAZidime (FORTAZ) 2 g in dextrose 5 % 50 mL IVPB     2 g100 mL/hr over 30 Minutes Intravenous  Once 04/25/14 1618 04/25/14 1701   04/24/14 1200  vancomycin (VANCOCIN) IVPB 1000 mg/200 mL premix  Status:  Discontinued     1,000 mg200 mL/hr over 60 Minutes Intravenous Every T-Th-Sa (Hemodialysis) 04/21/14 2200 04/22/14 0855   04/22/14 2200  cefTRIAXone (ROCEPHIN) 1 g in dextrose 5 % 50 mL IVPB  Status:  Discontinued     1 g100 mL/hr over 30 Minutes Intravenous Every 24 hours 04/21/14  2211 04/22/14 0855   04/22/14 0645  vancomycin (VANCOCIN) 1,750 mg in sodium chloride 0.9 % 500 mL IVPB  Status:  Discontinued     1,750 mg250 mL/hr over 120 Minutes Intravenous  Once 04/22/14 0634 04/22/14 0855   04/21/14 2200  cefTRIAXone (ROCEPHIN) 2 g in dextrose 5 % 50 mL IVPB  Status:  Discontinued     2 g100 mL/hr over 30 Minutes Intravenous Every 24 hours 04/21/14 2157 04/22/14 0855       Subjective:  Patient in bed, denies any chest abdominal pain, no shortness of breath, does have some pain in the left fourth and fifth toes. No focal weakness. +ve left fifth toe pain.    Objective: Filed Vitals:   05/03/14 0830  BP: 96/61  Pulse: 80  Temp:   Resp: 14    Intake/Output Summary (Last 24 hours) at 05/03/14 0857 Last data filed at 05/02/14 2156  Gross per 24 hour  Intake     50 ml  Output      0 ml  Net     50 ml   Filed Weights   04/28/14 1148 04/28/14 2042 05/03/14 0717  Weight: 84.6 kg (186 lb 8.2 oz) 83.1 kg (183 lb 3.2 oz) 86.3 kg (190 lb 4.1 oz)    Exam:  Gen: Alert Caucasian female in NAD. Chest: clear to  auscultate bilaterally, no ronchi or rales  Cardiac: Regular rate and rhythm, holosystolic murmur  Abdomen: soft, non tender, non distended, +bowel sounds. No guarding or rigidity  Extremities: Symmetrical in appearance without edema.  Left 5th toe with necrotic changes Neurological: Alert awake oriented to time place and person.  Psychiatric: Appears normal.       Data Reviewed: Basic Metabolic Panel:  Recent Labs Lab 04/27/14 0844 04/28/14 0758 05/01/14 1027 05/02/14 0517 05/03/14 0727  NA 138 134* 135  --  131*  K 4.9 4.0 4.4  --  3.6  CL 97 95* 95*  --  91*  CO2 --  26  GLUCOSE 247* 151* 94  --  394*  BUN --  12  CREATININE 3.96* 5.28* 6.92*  --  5.68*  CALCIUM 8.8 9.2 9.4  --  9.0  MG  --   --   --  2.4  --   PHOS  --  4.9*  --   --  4.8*   Liver Function Tests:  Recent Labs Lab 04/28/14 0758 05/03/14 0727  ALBUMIN 2.7* 2.5*   No results for input(s): LIPASE, AMYLASE in the last 168 hours. No results for input(s): AMMONIA in the last 168 hours. CBC:  Recent Labs Lab 04/27/14 0844 04/28/14 0758 05/01/14 1027 05/02/14 0517 05/03/14 0727  WBC 9.0 8.4 8.3 10.0 8.4  HGB 10.9* 9.7* 10.0* 11.0* 9.7*  HCT 35.2* 31.0* 32.4* 34.9* 31.6*  MCV 91.2 90.4 90.0 92.1 91.3  PLT 104* 157 214 155 188   Cardiac Enzymes: No results for input(s): CKTOTAL, CKMB, CKMBINDEX, TROPONINI in the last 168 hours. BNP (last 3 results)  Recent Labs  07/10/13 1330  PROBNP 15005.0*   CBG:  Recent Labs Lab 05/02/14 0805 05/02/14 1123 05/02/14 1613 05/02/14 2124 05/03/14 0646  GLUCAP 129* 171* 214* 368* 330*    Recent Results (from the past 240 hour(s))  Culture, routine-abscess     Status: None   Collection Time: 04/25/14 12:52 PM  Result Value Ref Range Status   Specimen Description ABSCESS  Final   Special Requests  ABSC T10 T11 DISC  Final   Gram Stain   Final    FEW WBC PRESENT,BOTH PMN AND MONONUCLEAR NO SQUAMOUS EPITHELIAL CELLS SEEN NO  ORGANISMS SEEN Performed at [facility]    Culture   Final    FEW STAPHYLOCOCCUS SPECIES (COAGULASE NEGATIVE) Note: RIFAMPIN AND GENTAMICIN SHOULD NOT BE USED AS SINGLE DRUGS FOR TREATMENT OF STAPH INFECTIONS. Performed at [facility]    Report Status 04/30/2014 FINAL  Final   Organism ID, Bacteria STAPHYLOCOCCUS SPECIES (COAGULASE NEGATIVE)  Final      Susceptibility   Staphylococcus species (coagulase negative) - MIC*    CLINDAMYCIN >=8 RESISTANT Resistant     ERYTHROMYCIN >=8 RESISTANT Resistant     GENTAMICIN >=16 RESISTANT Resistant     LEVOFLOXACIN >=8 RESISTANT Resistant     OXACILLIN >=4 RESISTANT Resistant     PENICILLIN >=0.5 RESISTANT Resistant     RIFAMPIN <=0.5 SENSITIVE Sensitive     TRIMETH/SULFA >=320 RESISTANT Resistant     VANCOMYCIN 2 SENSITIVE Sensitive     TETRACYCLINE 2 SENSITIVE Sensitive     * FEW STAPHYLOCOCCUS SPECIES (COAGULASE NEGATIVE)  Surgical pcr screen     Status: None   Collection Time: 05/02/14  3:54 PM  Result Value Ref Range Status   MRSA, PCR NEGATIVE NEGATIVE Final   Staphylococcus aureus NEGATIVE NEGATIVE Final    Comment:        The Xpert SA Assay (FDA approved for NASAL specimens in patients over 13 years of age), is one component of a comprehensive surveillance program.  Test performance has been validated by [facility] for patients greater than or equal to 62 year old. It is not intended to diagnose infection nor to guide or monitor treatment.      Studies: No results found.  Scheduled Meds: . antiseptic oral rinse  7 mL Mouth Rinse BID  . aspirin  81 mg Oral Daily  . [START ON 05/04/2014]  ceFAZolin (ANCEF) IV  2 g Intravenous On Call to OR  . [START ON 05/04/2014] chlorhexidine  60 mL Topical Once  . clonazePAM  0.5 mg Oral QHS  . darbepoetin (ARANESP) injection - DIALYSIS  60 mcg Intravenous Q Tue-HD  . feeding supplement (RESOURCE BREEZE)  1 Container Oral TID BM  . heparin subcutaneous  5,000 Units  Subcutaneous 3 times per day  . insulin aspart  0-9 Units Subcutaneous TID WC  . insulin detemir  20 Units Subcutaneous QHS  . lanthanum  1,000 mg Oral TID WC  . lidocaine  20 mL Intradermal Once  . linagliptin  5 mg Oral Daily  . multivitamin  1 tablet Oral QHS  . mupirocin cream   Topical BID  . pantoprazole  40 mg Oral Daily  . polyethylene glycol  17 g Oral BID  . vancomycin  1,250 mg Intravenous Q Thu-HD  . [START ON 05/05/2014] vancomycin  1,000 mg Intravenous Q T,Th,Sa-HD   Continuous Infusions:   Principal Problem:   Discitis of lumbar region Active Problems:   Hyponatremia   Diabetes mellitus   HTN (hypertension)   ESRD on dialysis   Diskitis   Anemia of renal disease   Elevated troponin-(0.27)   Bradycardia-transient, resolved   Diabetic foot ulcer   Diabetic infection of left foot   Necrotic toes    Time spent: 35    SINGH,PRASHANT K M.D on 05/03/2014 at 8:57 AM  Between 7am to 7pm - Pager - [phone], After 7pm go to [email or web address] - password  [email or web address]  And look for the night coverage person covering me after hours  [facility]  Office  [phone]

## 2014-05-04 ENCOUNTER — Inpatient Hospital Stay (HOSPITAL_COMMUNITY): Payer: Medicaid Other | Admitting: Anesthesiology

## 2014-05-04 ENCOUNTER — Encounter (HOSPITAL_COMMUNITY)
Admission: EM | Disposition: A | Discharge status: Skilled Nursing Facility | Payer: Self-pay | Source: Home / Self Care | Attending: Internal Medicine

## 2014-05-04 HISTORY — PX: AMPUTATION: SHX166

## 2014-05-04 LAB — GLUCOSE, CAPILLARY
GLUCOSE-CAPILLARY: 251 mg/dL — AB (ref 70–99)
GLUCOSE-CAPILLARY: 129 mg/dL — AB (ref 70–99)
GLUCOSE-CAPILLARY: 127 mg/dL — AB (ref 70–99)
Glucose-Capillary: 199 mg/dL — ABNORMAL HIGH (ref 70–99)
Glucose-Capillary: 126 mg/dL — ABNORMAL HIGH (ref 70–99)
Glucose-Capillary: 156 mg/dL — ABNORMAL HIGH (ref 70–99)

## 2014-05-04 SURGERY — AMPUTATION DIGIT
Anesthesia: Regional | Site: Foot | Laterality: Left

## 2014-05-04 MED ORDER — LIDOCAINE HCL 2 % IJ SOLN
INTRAMUSCULAR | Status: DC | PRN
  Administered 2014-05-04: 9 mL

## 2014-05-04 MED ORDER — CEFAZOLIN SODIUM-DEXTROSE 2-3 GM-% IV SOLR
INTRAVENOUS | Status: DC | PRN
  Administered 2014-05-04: 2 g via INTRAVENOUS

## 2014-05-04 MED ORDER — MIDAZOLAM HCL 2 MG/2ML IJ SOLN
2.0000 mg | Freq: Once | INTRAMUSCULAR | Status: AC
  Administered 2014-05-04: 2 mg via INTRAVENOUS

## 2014-05-04 MED ORDER — METOCLOPRAMIDE HCL 5 MG PO TABS
5.0000 mg | ORAL_TABLET | Freq: Three times a day (TID) | ORAL | Status: DC | PRN
  Filled 2014-05-04: qty 2

## 2014-05-04 MED ORDER — ONDANSETRON HCL 4 MG/2ML IJ SOLN
INTRAMUSCULAR | Status: AC
  Filled 2014-05-04: qty 2

## 2014-05-04 MED ORDER — ONDANSETRON HCL 4 MG PO TABS: 4.0000 mg | ORAL_TABLET | Freq: Four times a day (QID) | ORAL | Status: DC | PRN

## 2014-05-04 MED ORDER — ONDANSETRON HCL 4 MG/2ML IJ SOLN
INTRAMUSCULAR | Status: DC | PRN
  Administered 2014-05-04: 4 mg via INTRAVENOUS

## 2014-05-04 MED ORDER — FENTANYL CITRATE 0.05 MG/ML IJ SOLN
INTRAMUSCULAR | Status: AC
  Filled 2014-05-04: qty 5

## 2014-05-04 MED ORDER — METOCLOPRAMIDE HCL 5 MG/ML IJ SOLN: 5.0000 mg | Freq: Three times a day (TID) | INTRAMUSCULAR | Status: DC | PRN

## 2014-05-04 MED ORDER — PHENYLEPHRINE HCL 10 MG/ML IJ SOLN
INTRAMUSCULAR | Status: DC | PRN
  Administered 2014-05-04 (×2): 40 ug via INTRAVENOUS
  Administered 2014-05-04: 80 ug via INTRAVENOUS

## 2014-05-04 MED ORDER — ONDANSETRON HCL 4 MG/2ML IJ SOLN
4.0000 mg | Freq: Four times a day (QID) | INTRAMUSCULAR | Status: DC | PRN
  Administered 2014-05-04 – 2014-05-05 (×2): 4 mg via INTRAVENOUS
  Filled 2014-05-04 (×2): qty 2

## 2014-05-04 MED ORDER — OXYCODONE-ACETAMINOPHEN 5-325 MG PO TABS
1.0000 | ORAL_TABLET | ORAL | Status: DC | PRN
  Administered 2014-05-05 – 2014-05-07 (×11): 2 via ORAL
  Filled 2014-05-04 (×3): qty 2
  Filled 2014-05-04: qty 1
  Filled 2014-05-04 (×6): qty 2

## 2014-05-04 MED ORDER — MIDAZOLAM HCL 2 MG/2ML IJ SOLN
INTRAMUSCULAR | Status: AC
  Filled 2014-05-04: qty 2

## 2014-05-04 MED ORDER — METHOCARBAMOL 500 MG PO TABS: 500.0000 mg | ORAL_TABLET | Freq: Four times a day (QID) | ORAL | Status: DC | PRN

## 2014-05-04 MED ORDER — MIDAZOLAM HCL 2 MG/2ML IJ SOLN
INTRAMUSCULAR | Status: AC
  Administered 2014-05-04: 2 mg via INTRAVENOUS
  Filled 2014-05-04: qty 2

## 2014-05-04 MED ORDER — SODIUM CHLORIDE 0.9 % IV SOLN
INTRAVENOUS | Status: DC
  Administered 2014-05-04: 17:00:00 via INTRAVENOUS

## 2014-05-04 MED ORDER — HYDROMORPHONE HCL 1 MG/ML IJ SOLN
0.5000 mg | INTRAMUSCULAR | Status: DC | PRN
  Administered 2014-05-04 – 2014-05-07 (×12): 1 mg via INTRAVENOUS
  Filled 2014-05-04 (×12): qty 1

## 2014-05-04 MED ORDER — 0.9 % SODIUM CHLORIDE (POUR BTL) OPTIME
TOPICAL | Status: DC | PRN
Start: 2014-05-04 — End: 2014-05-04
  Administered 2014-05-04: 1000 mL

## 2014-05-04 MED ORDER — LIDOCAINE HCL 2 % IJ SOLN
INTRAMUSCULAR | Status: AC
  Filled 2014-05-04: qty 20

## 2014-05-04 MED ORDER — DOCUSATE SODIUM 100 MG PO CAPS
100.0000 mg | ORAL_CAPSULE | Freq: Two times a day (BID) | ORAL | Status: DC
  Administered 2014-05-04 – 2014-05-07 (×6): 100 mg via ORAL
  Filled 2014-05-04 (×8): qty 1

## 2014-05-04 MED ORDER — SODIUM CHLORIDE 0.9 % IV SOLN: INTRAVENOUS | Status: DC

## 2014-05-04 MED ORDER — FENTANYL CITRATE 0.05 MG/ML IJ SOLN
INTRAMUSCULAR | Status: DC | PRN
  Administered 2014-05-04: 50 ug via INTRAVENOUS

## 2014-05-04 MED ORDER — PROPOFOL 10 MG/ML IV BOLUS
INTRAVENOUS | Status: DC | PRN
  Administered 2014-05-04: 150 mg via INTRAVENOUS

## 2014-05-04 MED ORDER — LIDOCAINE HCL (CARDIAC) 20 MG/ML IV SOLN
INTRAVENOUS | Status: DC | PRN
  Administered 2014-05-04: 50 mg via INTRAVENOUS

## 2014-05-04 MED ORDER — METHOCARBAMOL 1000 MG/10ML IJ SOLN
500.0000 mg | Freq: Four times a day (QID) | INTRAVENOUS | Status: DC | PRN
  Filled 2014-05-04: qty 5

## 2014-05-04 SURGICAL SUPPLY — 36 items
BLADE SURG 21 STRL SS (BLADE) ×3 IMPLANT
BNDG CMPR 9X4 STRL LF SNTH (GAUZE/BANDAGES/DRESSINGS)
BNDG COHESIVE 4X5 TAN STRL (GAUZE/BANDAGES/DRESSINGS) ×3 IMPLANT
BNDG ESMARK 4X9 LF (GAUZE/BANDAGES/DRESSINGS) IMPLANT
BNDG GAUZE ELAST 4 BULKY (GAUZE/BANDAGES/DRESSINGS) ×3 IMPLANT
COVER SURGICAL LIGHT HANDLE (MISCELLANEOUS) ×3 IMPLANT
DRAPE U-SHAPE 47X51 STRL (DRAPES) ×3 IMPLANT
DRSG ADAPTIC 3X8 NADH LF (GAUZE/BANDAGES/DRESSINGS) ×2 IMPLANT
DRSG PAD ABDOMINAL 8X10 ST (GAUZE/BANDAGES/DRESSINGS) ×3 IMPLANT
DURAPREP 26ML APPLICATOR (WOUND CARE) ×3 IMPLANT
ELECT REM PT RETURN 9FT ADLT (ELECTROSURGICAL) ×3
ELECTRODE REM PT RTRN 9FT ADLT (ELECTROSURGICAL) ×1 IMPLANT
GAUZE SPONGE 4X4 12PLY STRL (GAUZE/BANDAGES/DRESSINGS) ×2 IMPLANT
GLOVE BIOGEL PI IND STRL 7.0 (GLOVE) IMPLANT
GLOVE BIOGEL PI IND STRL 7.5 (GLOVE) IMPLANT
GLOVE BIOGEL PI IND STRL 9 (GLOVE) ×1 IMPLANT
GLOVE BIOGEL PI INDICATOR 7.0 (GLOVE) ×2
GLOVE BIOGEL PI INDICATOR 7.5 (GLOVE) ×2
GLOVE BIOGEL PI INDICATOR 9 (GLOVE) ×2
GLOVE ECLIPSE 7.0 STRL STRAW (GLOVE) ×2 IMPLANT
GLOVE SURG ORTHO 9.0 STRL STRW (GLOVE) ×3 IMPLANT
GLOVE SURG SS PI 6.5 STRL IVOR (GLOVE) ×2 IMPLANT
GOWN STRL REUS W/ TWL XL LVL3 (GOWN DISPOSABLE) ×2 IMPLANT
GOWN STRL REUS W/TWL XL LVL3 (GOWN DISPOSABLE) ×9
KIT BASIN OR (CUSTOM PROCEDURE TRAY) ×3 IMPLANT
KIT ROOM TURNOVER OR (KITS) ×3 IMPLANT
MANIFOLD NEPTUNE II (INSTRUMENTS) ×3 IMPLANT
NEEDLE 22X1 1/2 (OR ONLY) (NEEDLE) IMPLANT
NS IRRIG 1000ML POUR BTL (IV SOLUTION) ×3 IMPLANT
PACK ORTHO EXTREMITY (CUSTOM PROCEDURE TRAY) ×3 IMPLANT
PAD ARMBOARD 7.5X6 YLW CONV (MISCELLANEOUS) ×6 IMPLANT
SUCTION FRAZIER TIP 10 FR DISP (SUCTIONS) IMPLANT
SUT ETHILON 2 0 PSLX (SUTURE) ×3 IMPLANT
SYR CONTROL 10ML LL (SYRINGE) IMPLANT
TOWEL OR 17X24 6PK STRL BLUE (TOWEL DISPOSABLE) ×3 IMPLANT
TOWEL OR 17X26 10 PK STRL BLUE (TOWEL DISPOSABLE) ×3 IMPLANT

## 2014-05-04 NOTE — Anesthesia Preprocedure Evaluation (Addendum)
Anesthesia Evaluation  Patient identified by MRN, date of birth, ID band Patient awake    Reviewed: Allergy & Precautions, NPO status , Patient's Chart, lab work & pertinent test results, reviewed documented beta blocker date and time   Airway Mallampati: II  TM Distance: >3 FB Neck ROM: Full    Dental no notable dental hx. (+) Dental Advisory Given   Pulmonary shortness of breath,  breath sounds clear to auscultation  Pulmonary exam normal       Cardiovascular hypertension, Pt. on medications Rhythm:Regular Rate:Normal  ECHO 04/2014 EF 60%, EKG poss old inf MI no change   Neuro/Psych    GI/Hepatic GERD-  Medicated,  Endo/Other  diabetes, Poorly Controlled, Type 2, Insulin Dependent  Renal/GU DialysisRenal disease     Musculoskeletal  (+) Arthritis -,   Abdominal   Peds  Hematology  (+) anemia , 9.7/10 H/H   Anesthesia Other Findings   Reproductive/Obstetrics                        Anesthesia Physical Anesthesia Plan  ASA: III  Anesthesia Plan: General and Regional   Post-op Pain Management: MAC Combined w/ Regional for Post-op pain   Induction:   Airway Management Planned: LMA  Additional Equipment: None  Intra-op Plan:   Post-operative Plan: Extubation in OR  Informed Consent: I have reviewed the patients History and Physical, chart, labs and discussed the procedure including the risks, benefits and alternatives for the proposed anesthesia with the patient or authorized representative who has indicated his/her understanding and acceptance.   Dental advisory given  Plan Discussed with:   Anesthesia Plan Comments: (Will offer ankle block)     Anesthesia Quick Evaluation

## 2014-05-04 NOTE — H&P (View-Only) (Signed)
Patient ID: [name], female   DOB: 08/13/1960, 54 y.o.   MRN: [id number] Plan for surgery Friday for amputation fourth and fifth toes left foot. Nothing by mouth after midnight Thursday

## 2014-05-04 NOTE — Progress Notes (Signed)
[facility] Progress Note  Assessment/Plan: 1. T10-11 discitis - on Vanc day 2/42; plan per pt to remove TDC in OR area after surgery today 2. ESRD - [facility] - HD Saturday - using AVF without problems 3. Anemia - Hgb 9.7 stable 4. Secondary hyperparathyroidism - calcitriol on hold due to [facility]; on fosrenol 5. HTN/volume - net UF 1.7 Tues and 1.5 Thursday - got to edw - would expect some weight loss in hospital - set goal for 84 kg 6. Nutrition - renal diet + resource supplements 7. Gangrenous left 4th and 5th toes - Dr. Lajoyce Corners to amputate today; s/p Left SFA stent by Dr. Imogene Burn for isch LLE 8. Deconditioning - SW working on rehab options 9. DM - BS on the high side - per primary; NPO for surgery now  Sheffield Slider, PA-C [facility] Beeper [phone] 05/04/2014,12:18 PM  LOS: 13 days   Subjective:   Resigned to getting toes amputated; hopes to find rehab facility near her current HD center. States AVF works well with 2 needles going up (one up and one down didn't work)  Objective Filed Vitals:   05/03/14 1125 05/03/14 1400 05/03/14 2158 05/04/14 0449  BP: 130/70 142/64 116/61 124/70  Pulse: 88 89 79 83  Temp: 97.3 F (36.3 C) 98.9 F (37.2 C) 97.9 F (36.6 C) 98.2 F (36.8 C)  TempSrc: Axillary  Oral Oral  Resp: Height:      Weight: 85.1 kg (187 lb 9.8 oz)     SpO2: 97% 100% 100% 100%   Physical Exam General: alert, NAD, transiently tearful during discussion Heart: RRR Lungs: no rales Abdomen: soft obese Extremities: right foot puffy;  left foot with sock, painful Dialysis Access: right upper AVF + bruit  Dialysis Orders: [facility] 85 kgs 2K/2ca 4 hr 400/800 6200 Heparin. L IJ cath and R AVF Calcitriol 0.75 Aranesp 25 q week.  Additional Objective Labs: Basic Metabolic Panel:  Recent Labs Lab 04/28/14 0758 05/01/14 1027 05/03/14 0727 05/03/14 2207  NA 134* 135 131*  --   K 4.0 4.4 3.6  --   CL 95*  95* 91*  --   CO2 --   GLUCOSE 151* 94 394* 445*  BUN --   CREATININE 5.28* 6.92* 5.68*  --   CALCIUM 9.2 9.4 9.0  --   PHOS 4.9*  --  4.8*  --    Liver Function Tests:  Recent Labs Lab 04/28/14 0758 05/03/14 0727  ALBUMIN 2.7* 2.5*   CBC:  Recent Labs Lab 04/28/14 0758 05/01/14 1027 05/02/14 0517 05/03/14 0727  WBC 8.4 8.3 10.0 8.4  HGB 9.7* 10.0* 11.0* 9.7*  HCT 31.0* 32.4* 34.9* 31.6*  MCV 90.4 90.0 92.1 91.3  PLT 157 214 155 188   Blood Culture    Component Value Date/Time   SDES ABSCESS 04/25/2014 1252   SPECREQUEST ABSC T10 T11 DISC 04/25/2014 1252   CULT  04/25/2014 1252    FEW STAPHYLOCOCCUS SPECIES (COAGULASE NEGATIVE) Note: RIFAMPIN AND GENTAMICIN SHOULD NOT BE USED AS SINGLE DRUGS FOR TREATMENT OF STAPH INFECTIONS. Performed at [facility]    REPTSTATUS 04/30/2014 FINAL 04/25/2014 1252  CBG:  Recent Labs Lab 05/03/14 1612 05/03/14 2204 05/03/14 2346 05/04/14 0157 05/04/14 0623  GLUCAP 262* 444* 367* 251* 199*  Medications:   . antiseptic oral rinse  7 mL Mouth Rinse BID  . aspirin  81 mg Oral  Daily  .  ceFAZolin (ANCEF) IV  2 g Intravenous On Call to OR  . chlorhexidine  60 mL Topical Once  . clonazePAM  0.5 mg Oral QHS  . darbepoetin (ARANESP) injection - DIALYSIS  60 mcg Intravenous Q Tue-HD  . feeding supplement (RESOURCE BREEZE)  1 Container Oral TID BM  . heparin subcutaneous  5,000 Units Subcutaneous 3 times per day  . insulin aspart  0-9 Units Subcutaneous TID WC  . insulin detemir  20 Units Subcutaneous QHS  . lanthanum  1,000 mg Oral TID WC  . lidocaine  20 mL Intradermal Once  . linagliptin  5 mg Oral Daily  . multivitamin  1 tablet Oral QHS  . mupirocin cream   Topical BID  . pantoprazole  40 mg Oral Daily  . polyethylene glycol  17 g Oral BID  . [START ON 05/05/2014] vancomycin  1,000 mg Intravenous Q T,Th,Sa-HD     I have seen and examined this patient and agree with plan as outlined by Bard HerbertMarty  Bergman, PA-C. Terrial RhodesOLADONATO,Ura Yingling A,MD 05/04/2014 2:28 PM

## 2014-05-04 NOTE — Transfer of Care (Signed)
Immediate Anesthesia Transfer of Care Note  Patient: Katherine MarionKimberly J [name]  Procedure(s) Performed: Procedure(s): AMPUTATION 4th and 5th TOES (Left)  Patient Location: PACU  Anesthesia Type:General  Level of Consciousness: awake, alert  and sedated  Airway & Oxygen Therapy: Patient connected to face mask oxygen  Post-op Assessment: Post -op Vital signs reviewed and stable  Post vital signs: stable  Complications: No apparent anesthesia complications

## 2014-05-04 NOTE — Progress Notes (Signed)
[facility] PROGRESS NOTE  [name] ZOX:[id number] DOB: 1960-06-06 DOA: 04/21/2014 PCP: Pearla Dubonnet, MD  Summary  [name] is a 54 y.o. female with a history of ESRD on HD, HTN, IDDM, Lumbar DDD who presents to the ED with complaints of progressive weakness over the past month in both legs and her knee buckling and precipitating a fall today. She was not able to get up and was brought to the ED. She also had an injury to her Left 5th Toe 1 month ago and she saw her PCP, and over the month she developed increased redness so her dialysis doctor placed over on IV Vancomycin to treat a cellulitis with her dialysis Rx 4 days ago. In the ED tonight, An X-Ray of the Left Foot was performed and was negative for fracture and findings of Osteomyelitis, and an MRI of the Lumbar Spine which revealed +diskitis/Osteomyelitis of the T10-T11 disk. NeuroSurgery Dr. Tressie Stalker was consulted and patient was placed on IV Vancomycin and Rocephin and referred for medical admission.   As per Neurosurgery no surgery and continue IV antibiotics. Based on CT guided aspiration data she will be placed on 6 weeks of vancomycin during her dialysis runs. ID on board. Also being followed by renal for dialysis, she was seen by IR for CT-guided disc aspiration. Cardiology had also seen the patient this admission.  She also developed worsening pain in her left fourth and fifth toe which was suspicious for ischemic etiology, vascular surgery was consulted and she underwent arteriogram with a L Fem stent placement, however she had distal disease as well which was not surgically repairable as she was thought to be a poor candidate for bypass. She is due for left fourth and fifth toe amputation by Dr. Lajoyce Corners on 05/04/2014. Thereafter her left temporary dialysis catheter will be removed by IR as she has a functioning right arm fistula for dialysis.   Assessment/Plan:  Discitis  -MRI of lumbar  spine evidence of discitis/osteomyelitis of T10-T11 disc, status post CT-guided aspiration. Growing coag-negative staph. Per ID she will get 7 weeks of IV vancomycin during dialysis runs, day 1 will be counted once her temporary dialysis catheter is removed which is planned after her toe removal surgery on 05/04/2014.   Seen by neurosurgery this admission Dr. Lovell Sheehan. Per Dr. Lovell Sheehan no surgical intervention needed.   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 -Consulted Dr. Lajoyce Corners is due for left fourth and fifth toe amputation on 05/04/2014.   Elevated troponin - Chest pain-free, non-ACS pattern troponin trend. Seen by cardiology no further intervention continue present medical care which includes aspirin, beta blocker due to bradycardia earlier in admission.    ESRD on dialysis -Nephrology on board -Receives HD T, R, Sat.    -Removed temporary left IJ dialysis catheter after toe amputation surgery. She has a functioning right arm AV fistula.   Mobitz type I heart block/bradycardia -Remains asymptomatic, 2D echo 04/2014- EF 55-60%, mild LVH, mild AS, a blocker stopped. Repeat EKG stable.    Diabetes mellitus -Hemoglobin A1c 9.5,on  Levemir  continue SSI,  and Tradjental 5mg  daily.     CBG (last 3)   Recent Labs  05/03/14 2346 05/04/14 0157 05/04/14 0623  GLUCAP 367* 251* 199*     Essential  hypertension -BP stable -Atenolol discontinued due to episode of bradycardia   Anemia of renal disease -Hgb 10.5 -Continue [place]-vit -Repeat CBC in a.m.   Metabolic bone disease -Due to ESRD -Continue Fosrenol   Leukocytosis -Resolved- likely due to discitis, cellulitis to    Anxiety Continue Klonopin QHS   Obesity BMI 36, follow with PCP       DVT Prophylaxis SCDs -  Heparin Code Status: Full Family Communication:  No family at bedside Disposition Plan: SNF   Consultants:  ID  Nephrology  Cardiology  Neurosurgery Dr Verdene Lennert Surgery Dr Imogene Burn  IR  Orthopedics Dr. Lajoyce Corners    Procedures:  1.   Joint Aspiration 04/25/13  2. CT guided T-L spine aspiration 04-25-13 by IR  3.  Arteriogram By Vascular Dr Imogene Burn - 04-26-14  1. Right common femoral artery cannulation under ultrasound guidance 2. Placement of catheter in aorta 3. Aortogram 4. Left second order arterial selecgtion 5. Left leg runoff 6. Angioplasty and stenting of Left superficial femoral artery (5 mm x 30 mm self-expanding) 7. Right leg runoff    4.   Left fourth and fifth toe amputation scheduled for 05/04/2014 by Dr. Lajoyce Corners    Antibiotics:  Anti-infectives    Start     Dose/Rate Route Frequency Ordered Stop   05/05/14 1200  vancomycin (VANCOCIN) IVPB 1000 mg/200 mL premix     1,000 mg200 mL/hr over 60 Minutes Intravenous Every T-Th-Sa (Hemodialysis) 05/03/14 0844     05/04/14 0600  ceFAZolin (ANCEF) IVPB 2 g/50 mL premix     2 g100 mL/hr over 30 Minutes Intravenous On call to O.R. 05/02/14 1545 05/05/14 0559   05/03/14 1200  vancomycin (VANCOCIN) 1,250 mg in sodium chloride 0.9 % 250 mL IVPB     1,250 mg250 mL/hr over 60 Minutes Intravenous Every Thu (Hemodialysis) 05/03/14 0844 05/03/14 1300   05/01/14 1200  cefTAZidime (FORTAZ) 2 g in dextrose 5 % 50 mL IVPB  Status:  Discontinued     2 g100 mL/hr over 30 Minutes Intravenous Every T-Th-Sa (Hemodialysis) 04/29/14 1551 04/30/14 0947   04/26/14 1800  cefTAZidime (FORTAZ) 2 g in dextrose 5 % 50 mL IVPB  Status:  Discontinued     2 g100 mL/hr over 30 Minutes Intravenous Every T-Th-S-Su (1800) 04/25/14 1618 04/29/14 1551   04/26/14 1200  vancomycin (VANCOCIN) IVPB 1000 mg/200 mL premix  Status:  Discontinued     1,000 mg200 mL/hr over 60 Minutes Intravenous Every T-Th-Sa (Hemodialysis) 04/25/14 1618 05/03/14 0844    04/25/14 2300  metroNIDAZOLE (FLAGYL) tablet 500 mg  Status:  Discontinued     500 mg Oral Every 8 hours 04/25/14 2208 05/02/14 1056   04/25/14 1700  vancomycin (VANCOCIN) 2,000 mg in sodium chloride 0.9 % 500 mL IVPB     2,000 mg250 mL/hr over 120 Minutes Intravenous  Once 04/25/14 1618 04/25/14 1934   04/25/14 1700  cefTAZidime (FORTAZ) 2 g in dextrose 5 % 50 mL IVPB     2 g100 mL/hr over 30 Minutes Intravenous  Once 04/25/14 1618 04/25/14 1701   04/24/14 1200  vancomycin (VANCOCIN) IVPB 1000 mg/200 mL premix  Status:  Discontinued     1,000 mg200 mL/hr over 60 Minutes Intravenous Every T-Th-Sa (Hemodialysis) 04/21/14 2200 04/22/14 0855   04/22/14 2200  cefTRIAXone (ROCEPHIN) 1 g in dextrose 5 % 50 mL IVPB  Status:  Discontinued     1 g100 mL/hr over 30 Minutes Intravenous Every 24 hours 04/21/14 2211 04/22/14 0855   04/22/14  0645  vancomycin (VANCOCIN) 1,750 mg in sodium chloride 0.9 % 500 mL IVPB  Status:  Discontinued     1,750 mg250 mL/hr over 120 Minutes Intravenous  Once 04/22/14 0634 04/22/14 0855   04/21/14 2200  cefTRIAXone (ROCEPHIN) 2 g in dextrose 5 % 50 mL IVPB  Status:  Discontinued     2 g100 mL/hr over 30 Minutes Intravenous Every 24 hours 04/21/14 2157 04/22/14 0855       Subjective:  Patient in bed, denies any chest abdominal pain, no shortness of breath, does have some pain in the left fourth and fifth toes. No focal weakness. +ve left fifth toe pain.    Objective: Filed Vitals:   05/04/14 0449  BP: 124/70  Pulse: 83  Temp: 98.2 F (36.8 C)  Resp: 16    Intake/Output Summary (Last 24 hours) at 05/04/14 0958 Last data filed at 05/03/14 1125  Gross per 24 hour  Intake      0 ml  Output   1518 ml  Net  -1518 ml   Filed Weights   04/28/14 2042 05/03/14 0717 05/03/14 1125  Weight: 83.1 kg (183 lb 3.2 oz) 86.3 kg (190 lb 4.1 oz) 85.1 kg (187 lb 9.8 oz)    Exam:  Gen: Alert Caucasian female in NAD. Chest: clear to auscultate bilaterally, no ronchi or  rales  Cardiac: Regular rate and rhythm, holosystolic murmur  Abdomen: soft, non tender, non distended, +bowel sounds. No guarding or rigidity  Extremities: Symmetrical in appearance without edema.  Left 5th toe with necrotic changes Neurological: Alert awake oriented to time place and person.  Psychiatric: Appears normal.       Data Reviewed: Basic Metabolic Panel:  Recent Labs Lab 04/28/14 0758 05/01/14 1027 05/02/14 0517 05/03/14 0727 05/03/14 2207  NA 134* 135  --  131*  --   K 4.0 4.4  --  3.6  --   CL 95* 95*  --  91*  --   CO2 28 23  --  26  --   GLUCOSE 151* 94  --  394* 445*  BUN 23 23  --  12  --   CREATININE 5.28* 6.92*  --  5.68*  --   CALCIUM 9.2 9.4  --  9.0  --   MG  --   --  2.4  --   --   PHOS 4.9*  --   --  4.8*  --    Liver Function Tests:  Recent Labs Lab 04/28/14 0758 05/03/14 0727  ALBUMIN 2.7* 2.5*   No results for input(s): LIPASE, AMYLASE in the last 168 hours. No results for input(s): AMMONIA in the last 168 hours. CBC:  Recent Labs Lab 04/28/14 0758 05/01/14 1027 05/02/14 0517 05/03/14 0727  WBC 8.4 8.3 10.0 8.4  HGB 9.7* 10.0* 11.0* 9.7*  HCT 31.0* 32.4* 34.9* 31.6*  MCV 90.4 90.0 92.1 91.3  PLT 157 214 155 188   Cardiac Enzymes: No results for input(s): CKTOTAL, CKMB, CKMBINDEX, TROPONINI in the last 168 hours. BNP (last 3 results)  Recent Labs  07/10/13 1330  PROBNP 15005.0*   CBG:  Recent Labs Lab 05/03/14 1612 05/03/14 2204 05/03/14 2346 05/04/14 0157 05/04/14 0623  GLUCAP 262* 444* 367* 251* 199*    Recent Results (from the past 240 hour(s))  Culture, routine-abscess     Status: None   Collection Time: 04/25/14 12:52 PM  Result Value Ref Range Status   Specimen Description ABSCESS  Final   Special Requests ABSC  T10 T11 DISC  Final   Gram Stain   Final    FEW WBC PRESENT,BOTH PMN AND MONONUCLEAR NO SQUAMOUS EPITHELIAL CELLS SEEN NO ORGANISMS SEEN Performed at [facility]    Culture    Final    FEW STAPHYLOCOCCUS SPECIES (COAGULASE NEGATIVE) Note: RIFAMPIN AND GENTAMICIN SHOULD NOT BE USED AS SINGLE DRUGS FOR TREATMENT OF STAPH INFECTIONS. Performed at [facility]    Report Status 04/30/2014 FINAL  Final   Organism ID, Bacteria STAPHYLOCOCCUS SPECIES (COAGULASE NEGATIVE)  Final      Susceptibility   Staphylococcus species (coagulase negative) - MIC*    CLINDAMYCIN >=8 RESISTANT Resistant     ERYTHROMYCIN >=8 RESISTANT Resistant     GENTAMICIN >=16 RESISTANT Resistant     LEVOFLOXACIN >=8 RESISTANT Resistant     OXACILLIN >=4 RESISTANT Resistant     PENICILLIN >=0.5 RESISTANT Resistant     RIFAMPIN <=0.5 SENSITIVE Sensitive     TRIMETH/SULFA >=320 RESISTANT Resistant     VANCOMYCIN 2 SENSITIVE Sensitive     TETRACYCLINE 2 SENSITIVE Sensitive     * FEW STAPHYLOCOCCUS SPECIES (COAGULASE NEGATIVE)  Surgical pcr screen     Status: None   Collection Time: 05/02/14  3:54 PM  Result Value Ref Range Status   MRSA, PCR NEGATIVE NEGATIVE Final   Staphylococcus aureus NEGATIVE NEGATIVE Final    Comment:        The Xpert SA Assay (FDA approved for NASAL specimens in patients over 77 years of age), is one component of a comprehensive surveillance program.  Test performance has been validated by [facility] for patients greater than or equal to 7 year old. It is not intended to diagnose infection nor to guide or monitor treatment.      Studies: No results found.  Scheduled Meds: . antiseptic oral rinse  7 mL Mouth Rinse BID  . aspirin  81 mg Oral Daily  .  ceFAZolin (ANCEF) IV  2 g Intravenous On Call to OR  . chlorhexidine  60 mL Topical Once  . clonazePAM  0.5 mg Oral QHS  . darbepoetin (ARANESP) injection - DIALYSIS  60 mcg Intravenous Q Tue-HD  . feeding supplement (RESOURCE BREEZE)  1 Container Oral TID BM  . heparin subcutaneous  5,000 Units Subcutaneous 3 times per day  . insulin aspart  0-9 Units Subcutaneous TID WC  . insulin detemir  20  Units Subcutaneous QHS  . lanthanum  1,000 mg Oral TID WC  . lidocaine  20 mL Intradermal Once  . linagliptin  5 mg Oral Daily  . multivitamin  1 tablet Oral QHS  . mupirocin cream   Topical BID  . pantoprazole  40 mg Oral Daily  . polyethylene glycol  17 g Oral BID  . [START ON 05/05/2014] vancomycin  1,000 mg Intravenous Q T,Th,Sa-HD   Continuous Infusions:   Principal Problem:   Discitis of lumbar region Active Problems:   Hyponatremia   Diabetes mellitus   HTN (hypertension)   ESRD on dialysis   Diskitis   Anemia of renal disease   Elevated troponin-(0.27)   Bradycardia-transient, resolved   Diabetic foot ulcer   Diabetic infection of left foot   Necrotic toes    Time spent: 41    Electa Sterry K M.D on 05/04/2014 at 9:58 AM  Between 7am to 7pm - Pager - [phone], After 7pm go to [email or web address] - password [email or web address]  And look for the night coverage person covering me after hours  [facility]  Office  [phone]

## 2014-05-04 NOTE — Progress Notes (Signed)
Pts CBG rechecked, pts blood sugar 367.

## 2014-05-04 NOTE — Progress Notes (Signed)
PT Cancellation Note  Patient Details Name: [name] MRN: [id number] DOB: 01/08/1961   Cancelled Treatment:    Reason Eval/Treat Not Completed: Patient declined, no reason specified.  Talked with nursing and she reports pt doesn't want to do it due to surgery later today.   Ivar DrapeStout, [name] 05/04/2014, 11:58 AM   Samul Dadauth [name], PT MS Acute Rehab Dept. Number: [phone]

## 2014-05-04 NOTE — Clinical Social Work Note (Signed)
CopyAssistant CSW Director assisting with placement.  Patient has Medicaid (which has no SNF benefit), therefore will more than likely be a Letter of Guarantee, per Asst CSW Director.  [facility].  Patient currently being reviewed.  Patient [facility] while in rehab for transportation purposes.  Patient reluctant to switch.  Reportedly, patient to have toe amputation today (05/04/2014).  Possible weekend discharge.  Weekend coverage aware.    Vickii PennaGina Johnathon Olden, LCSWA [phone]  Psychiatric & Orthopedics (5N 1-16) Clinical Social Worker

## 2014-05-04 NOTE — Op Note (Signed)
04/21/2014 - 05/04/2014  5:38 PM  PATIENT:  [name]    PRE-OPERATIVE DIAGNOSIS:  Gangrene Left Foot 4th and 5th Toes  POST-OPERATIVE DIAGNOSIS:  Same  PROCEDURE:  AMPUTATION 4th and 5th TOES fourth and fifth ray amputations.  SURGEON:  Nadara MustardUDA,MARCUS V, MD  PHYSICIAN ASSISTANT:None ANESTHESIA:   General  PREOPERATIVE INDICATIONS:  [name] is a  54 y.o. female with a diagnosis of Gangrene Left Foot 4th and 5th Toes who failed conservative measures and elected for surgical management.    The risks benefits and alternatives were discussed with the patient preoperatively including but not limited to the risks of infection, bleeding, nerve injury, cardiopulmonary complications, the need for revision surgery, among others, and the patient was willing to proceed.  OPERATIVE IMPLANTS: None  OPERATIVE FINDINGS: Minimal petechial bleeding  OPERATIVE PROCEDURE: Patient is a 54 year old woman with end-stage renal disease on dialysis and diabetes with peripheral vascular disease who has gangrenous changes of the fourth and fifth toes left foot. Patient is not revascularization candidate and she presents at this time for fourth and fifth ray amputations. Risk and benefits were discussed including the potential for a transtibial amputation. Patient states she understands was to proceed at this time. Patient has black gangrenous changes of both the fourth and fifth toes. Patient initially only would consent to a fifth toe amputation despite the fourth toe also being black and gangrenous.  Patient was brought to the operating room and underwent a general anesthetic. Patient attempted to undergo a regional block in the holding area and patient cannot tolerate the procedure. The left lower extremity was prepped using DuraPrep draped into a sterile field. A timeout was called. A racquet incision was made around the gangrenous fourth and fifth toes the metatarsals were resected near the base  of the metatarsals. The wound was irrigated with normal saline there was minimal petechial bleeding. The incision was closed using 2-0 nylon. The wound was covered with sterile compressive dressing. Patient was extubated taken to the PACU in stable condition.

## 2014-05-04 NOTE — Interval H&P Note (Signed)
History and Physical Interval Note:  05/04/2014 6:25 AM  [name]  has presented today for surgery, with the diagnosis of Gangrene Left Foot 4th and 5th Toes  The various methods of treatment have been discussed with the patient and family. After consideration of risks, benefits and other options for treatment, the patient has consented to  Procedure(s): AMPUTATION 4th and 5th TOES (Left) as a surgical intervention .  The patient's history has been reviewed, patient examined, no change in status, stable for surgery.  I have reviewed the patient's chart and labs.  Questions were answered to the patient's satisfaction.     DUDA,MARCUS V

## 2014-05-04 NOTE — Progress Notes (Signed)
Orthopedic Tech Progress Note Patient Details:  Katherine MarionKimberly J [name] 05/10/1960 [id number] Delivered post op shoe to pt.'s room.     Lesle ChrisGilliland, Kynzee Devinney L 05/04/2014, 8:53 PM

## 2014-05-04 NOTE — Anesthesia Postprocedure Evaluation (Signed)
  Anesthesia Post-op Note  Patient: Alain MarionKimberly J Chandonnet  Procedure(s) Performed: Procedure(s): AMPUTATION 4th and 5th TOES (Left)  Patient Location: PACU  Anesthesia Type:General  Level of Consciousness: awake and alert   Airway and Oxygen Therapy: Patient Spontanous Breathing  Post-op Pain: none  Post-op Assessment: Post-op Vital signs reviewed  Post-op Vital Signs: stable  Last Vitals:  Filed Vitals:   05/04/14 1830  BP: 142/66  Pulse: 88  Temp:   Resp: 16    Complications: No apparent anesthesia complications

## 2014-05-04 NOTE — Anesthesia Procedure Notes (Signed)
Procedure Name: LMA Insertion Date/Time: 05/04/2014 5:29 PM Performed by: Ellin GoodieWEAVER, Coltan Spinello M Pre-anesthesia Checklist: Patient identified, Emergency Drugs available, Suction available, Patient being monitored and Timeout performed Patient Re-evaluated:Patient Re-evaluated prior to inductionOxygen Delivery Method: Circle system utilized Preoxygenation: Pre-oxygenation with 100% oxygen Intubation Type: IV induction LMA: LMA inserted LMA Size: 4.0 Number of attempts: 1 Placement Confirmation: positive ETCO2 and breath sounds checked- equal and bilateral Tube secured with: Tape

## 2014-05-04 NOTE — Progress Notes (Signed)
Report for the OR was called to Short Stay.

## 2014-05-04 NOTE — Progress Notes (Signed)
CBG re-checked once again, blood sugar now 251. Nursing will continue to monitor.

## 2014-05-04 NOTE — OR Nursing (Signed)
Internal medicine note from 1/22 indicates [facility] PA to pull dialysis catheter following toe amputation in OR.  Contacted Dr. Imogene Burnhen to discuss request and was unaware of this request.  If dialysis catheter needs to be removed, it will be assessed Saturday morning.

## 2014-05-05 ENCOUNTER — Inpatient Hospital Stay (HOSPITAL_COMMUNITY): Payer: Medicaid Other

## 2014-05-05 LAB — GLUCOSE, CAPILLARY
GLUCOSE-CAPILLARY: 252 mg/dL — AB (ref 70–99)
GLUCOSE-CAPILLARY: 108 mg/dL — AB (ref 70–99)
Glucose-Capillary: 189 mg/dL — ABNORMAL HIGH (ref 70–99)
Glucose-Capillary: 286 mg/dL — ABNORMAL HIGH (ref 70–99)

## 2014-05-05 LAB — BASIC METABOLIC PANEL
ANION GAP: 9 (ref 5–15)
BUN: 7 mg/dL (ref 6–23)
CO2: 27 mmol/L (ref 19–32)
Calcium: 8.6 mg/dL (ref 8.4–10.5)
Chloride: 97 mmol/L (ref 96–112)
Creatinine, Ser: 3.41 mg/dL — ABNORMAL HIGH (ref 0.50–1.10)
GFR calc Af Amer: 16 mL/min — ABNORMAL LOW (ref >90)
GFR calc non Af Amer: 14 mL/min — ABNORMAL LOW (ref >90)
Glucose, Bld: 299 mg/dL — ABNORMAL HIGH (ref 70–99)
POTASSIUM: 3.5 mmol/L (ref 3.5–5.1)
SODIUM: 133 mmol/L — AB (ref 135–145)

## 2014-05-05 LAB — RENAL FUNCTION PANEL
Albumin: 2.4 g/dL — ABNORMAL LOW (ref 3.5–5.2)
Anion gap: 17 — ABNORMAL HIGH (ref 5–15)
BUN: 10 mg/dL (ref 6–23)
CO2: 21 mmol/L (ref 19–32)
Calcium: 8.7 mg/dL (ref 8.4–10.5)
Chloride: 92 mmol/L — ABNORMAL LOW (ref 96–112)
Creatinine, Ser: 5.39 mg/dL — ABNORMAL HIGH (ref 0.50–1.10)
GFR calc Af Amer: 9 mL/min — ABNORMAL LOW (ref >90)
GFR calc non Af Amer: 8 mL/min — ABNORMAL LOW (ref >90)
Glucose, Bld: 244 mg/dL — ABNORMAL HIGH (ref 70–99)
Phosphorus: 4 mg/dL (ref 2.3–4.6)
Potassium: 3.5 mmol/L (ref 3.5–5.1)
Sodium: 130 mmol/L — ABNORMAL LOW (ref 135–145)

## 2014-05-05 LAB — CBC
HCT: 34.2 % — ABNORMAL LOW (ref 36.0–46.0)
Hemoglobin: 10.4 g/dL — ABNORMAL LOW (ref 12.0–15.0)
MCH: 27.8 pg (ref 26.0–34.0)
MCHC: 30.4 g/dL (ref 30.0–36.0)
MCV: 91.4 fL (ref 78.0–100.0)
PLATELETS: 218 10*3/uL (ref 150–400)
RBC: 3.74 MIL/uL — ABNORMAL LOW (ref 3.87–5.11)
RDW: 20.3 % — ABNORMAL HIGH (ref 11.5–15.5)
WBC: 6.4 10*3/uL (ref 4.0–10.5)

## 2014-05-05 LAB — MAGNESIUM: MAGNESIUM: 2 mg/dL (ref 1.5–2.5)

## 2014-05-05 MED ORDER — LIDOCAINE HCL (PF) 1 % IJ SOLN: 5.0000 mL | INTRAMUSCULAR | Status: DC | PRN

## 2014-05-05 MED ORDER — LIDOCAINE HCL (PF) 2 % IJ SOLN
0.0000 mL | Freq: Once | INTRAMUSCULAR | Status: AC | PRN
  Filled 2014-05-05: qty 20

## 2014-05-05 MED ORDER — SODIUM CHLORIDE 0.9 % IV SOLN: 100.0000 mL | INTRAVENOUS | Status: DC | PRN

## 2014-05-05 MED ORDER — INSULIN DETEMIR 100 UNIT/ML ~~LOC~~ SOLN
25.0000 [IU] | Freq: Every day | SUBCUTANEOUS | Status: DC
  Administered 2014-05-05: 25 [IU] via SUBCUTANEOUS
  Filled 2014-05-05 (×2): qty 0.25

## 2014-05-05 MED ORDER — PENTAFLUOROPROP-TETRAFLUOROETH EX AERO: 1.0000 "application " | INHALATION_SPRAY | CUTANEOUS | Status: DC | PRN

## 2014-05-05 MED ORDER — HEPARIN SODIUM (PORCINE) 1000 UNIT/ML DIALYSIS: 1000.0000 [IU] | INTRAMUSCULAR | Status: DC | PRN

## 2014-05-05 MED ORDER — MAGNESIUM SULFATE IN D5W 10-5 MG/ML-% IV SOLN
1.0000 g | Freq: Once | INTRAVENOUS | Status: AC
  Administered 2014-05-05: 1 g via INTRAVENOUS
  Filled 2014-05-05: qty 100

## 2014-05-05 MED ORDER — HEPARIN SODIUM (PORCINE) 1000 UNIT/ML DIALYSIS: 2000.0000 [IU] | INTRAMUSCULAR | Status: DC | PRN

## 2014-05-05 MED ORDER — LIDOCAINE-PRILOCAINE 2.5-2.5 % EX CREA: 1.0000 "application " | TOPICAL_CREAM | CUTANEOUS | Status: DC | PRN

## 2014-05-05 MED ORDER — LIDOCAINE-PRILOCAINE 2.5-2.5 % EX CREA
1.0000 "application " | TOPICAL_CREAM | CUTANEOUS | Status: DC | PRN
  Filled 2014-05-05: qty 5

## 2014-05-05 MED ORDER — OXYCODONE-ACETAMINOPHEN 5-325 MG PO TABS
ORAL_TABLET | ORAL | Status: AC
  Filled 2014-05-05: qty 2

## 2014-05-05 MED ORDER — NEPRO/CARBSTEADY PO LIQD
237.0000 mL | ORAL | Status: DC | PRN
  Filled 2014-05-05: qty 237

## 2014-05-05 MED ORDER — ALTEPLASE 2 MG IJ SOLR
2.0000 mg | Freq: Once | INTRAMUSCULAR | Status: DC | PRN
  Filled 2014-05-05: qty 2

## 2014-05-05 MED ORDER — ALTEPLASE 2 MG IJ SOLR: 2.0000 mg | Freq: Once | INTRAMUSCULAR | Status: DC | PRN

## 2014-05-05 NOTE — Progress Notes (Signed)
[facility] Progress Note  Assessment/Plan: 1. T10-11 discitis - on Vanc day 3/42; cath not removed as planned - pt states her Hospitalist is going to contact [facility] to arrange 2. ESRD - TTS [place] - HD Saturday - using AVF without problems - labs pending 3. Anemia - Hgb +/- 10  Stable on ESA 4. Secondary hyperparathyroidism - calcitriol on hold due to [facility]; on fosrenol 5. HTN/volume - net UF 1.7 Tues and 1.5 Thursday - got to edw - would expect some weight loss in hospital -  BP variable - goal 2.5 today 6. Nutrition - renal diet + resource supplements 7. Gangrenous s/p left 4th and 5th ray amp - Dr. Lajoyce Corners 1/22; s/p Left SFA stent by Dr. Imogene Burn for isch LLE; noted transient temp spike this am, but normalized without tylenol; watch 8. Deconditioning - SW working on rehab options 9. DM - per primary;   Sheffield Slider, PA-C [facility] Beeper [phone] 05/05/2014,8:44 AM  LOS: 14 days   Pt seen, examined and agree w A/P as above.  Vinson Moselle MD pager [phone]    cell [phone] 05/05/2014, 1:32 PM    Subjective:   Feels better today more relaxed; pain bearable; now that foot pain is less, she notices her back pain more. RN got her dinner from [place] and missed breakfast because of HD  Objective Filed Vitals:   05/05/14 0758 05/05/14 0804 05/05/14 0809 05/05/14 0830  BP: 109/56 106/53 114/60 148/88  Pulse: 83 80 80 89  Temp: 98.8 F (37.1 C)     TempSrc: Oral     Resp: 18     Height:      Weight: 85 kg (187 lb 6.3 oz)     SpO2: 95%      Physical Exam General: NAD, some facial puffiness Heart:tachy regular ~100 Lungs: no rales Abdomen: obese soft Extremities: left foot wrpped; 1+ RLE edema Dialysis Access: left IJ cath and right AVF  Dialysis Orders:  TTS [place] 85 kgs 2K/2ca 4 hr 400/800 6200 Heparin. L IJ cath and R AVF Calcitriol 0.75 Aranesp 25 q week.   Additional Objective Labs: Basic Metabolic Panel:  Recent  Labs Lab 05/01/14 1027 05/03/14 0727 05/03/14 2207  NA 135 131*  --   K 4.4 3.6  --   CL 95* 91*  --   CO2 23 26  --   GLUCOSE 94 394* 445*  BUN 23 12  --   CREATININE 6.92* 5.68*  --   CALCIUM 9.4 9.0  --   PHOS  --  4.8*  --    Liver Function Tests:  Recent Labs Lab 05/03/14 0727  ALBUMIN 2.5*   CBC:  Recent Labs Lab 05/01/14 1027 05/02/14 0517 05/03/14 0727 05/05/14 0355  WBC 8.3 10.0 8.4 6.4  HGB 10.0* 11.0* 9.7* 10.4*  HCT 32.4* 34.9* 31.6* 34.2*  MCV 90.0 92.1 91.3 91.4  PLT 214 155 188 218   Blood Culture    Component Value Date/Time   SDES ABSCESS 04/25/2014 1252   SPECREQUEST ABSC T10 T11 DISC 04/25/2014 1252   CULT  04/25/2014 1252    FEW STAPHYLOCOCCUS SPECIES (COAGULASE NEGATIVE) Note: RIFAMPIN AND GENTAMICIN SHOULD NOT BE USED AS SINGLE DRUGS FOR TREATMENT OF STAPH INFECTIONS. Performed at [facility]    REPTSTATUS 04/30/2014 FINAL 04/25/2014 1252  CBG:  Recent Labs Lab 05/04/14 1151 05/04/14 1602 05/04/14 1803 05/04/14 2154 05/05/14 0641  GLUCAP 129* 126* 127* 156* 252*  Medications: .  sodium chloride     . antiseptic oral rinse  7 mL Mouth Rinse BID  . aspirin  81 mg Oral Daily  . clonazePAM  0.5 mg Oral QHS  . darbepoetin (ARANESP) injection - DIALYSIS  60 mcg Intravenous Q Tue-HD  . docusate sodium  100 mg Oral BID  . feeding supplement (RESOURCE BREEZE)  1 Container Oral TID BM  . heparin subcutaneous  5,000 Units Subcutaneous 3 times per day  . insulin aspart  0-9 Units Subcutaneous TID WC  . insulin detemir  20 Units Subcutaneous QHS  . lanthanum  1,000 mg Oral TID WC  . lidocaine  20 mL Intradermal Once  . linagliptin  5 mg Oral Daily  . multivitamin  1 tablet Oral QHS  . mupirocin cream   Topical BID  . pantoprazole  40 mg Oral Daily  . polyethylene glycol  17 g Oral BID  . vancomycin  1,000 mg Intravenous Q T,Th,Sa-HD

## 2014-05-05 NOTE — Progress Notes (Signed)
Pt in HD this morning.  Will order supplies to room and remove diatek catheter tomorrow morning.    Hold 0600 SQ heparin dose.  Erol Flanagin 05/05/2014 10:04 AM

## 2014-05-05 NOTE — Progress Notes (Signed)
PT Cancellation Note  Patient Details Name: Orson EvaKimberly J Sneeringer MRN: [id number] DOB: 08/19/1960   Cancelled Treatment:    Reason Eval/Treat Not Completed: Patient at procedure or test/unavailable;Patient declined, no reason specified;Other (comment) (patient at HD am, patient c/o nausea early pm, asleep pm )  Attempted to see patient 3 times today without success.  Patient tearful at [phone] with general c/o pain and nausea although she reported having medications 20 mins prior to my visit.  May see [facility] if staffing allows. Stephanie AcreKristen M BirminghamSoth, PT [phone] Minor Iden 05/05/2014, 5:39 PM

## 2014-05-05 NOTE — Progress Notes (Signed)
[facility] PROGRESS NOTE  [name]:[id number] DOB: [birth year] DOA: 04/21/2014 PCP: Pearla Dubonnet, MD  Summary  [name] is a 54 y.o. female with a history of ESRD on HD, HTN, IDDM, Lumbar DDD who presents to the ED with complaints of progressive weakness over the past month in both legs and her knee buckling and precipitating a fall today. She was not able to get up and was brought to the ED. She also had an injury to her Left 5th Toe 1 month ago and she saw her PCP, and over the month she developed increased redness so her dialysis doctor placed over on IV Vancomycin to treat a cellulitis with her dialysis Rx 4 days ago. In the ED tonight, An X-Ray of the Left Foot was performed and was negative for fracture and findings of Osteomyelitis, and an MRI of the Lumbar Spine which revealed +diskitis/Osteomyelitis of the T10-T11 disk. NeuroSurgery Dr. Tressie Stalker was consulted and patient was placed on IV Vancomycin and Rocephin and referred for medical admission.   As per Neurosurgery no surgery and continue IV antibiotics. Based on CT guided aspiration data she will be placed on 6 weeks of vancomycin during her dialysis runs. ID on board. Also being followed by renal for dialysis, she was seen by IR for CT-guided disc aspiration. Cardiology had also seen the patient this admission.  She also developed worsening pain in her left fourth and fifth toe which was suspicious for ischemic etiology, vascular surgery was consulted and she underwent arteriogram with a L Fem stent placement, however she had distal disease as well which was not surgically repairable as she was thought to be a poor candidate for bypass. She underwent left fourth and fifth toe amputation by Dr. Lajoyce Corners on 05/04/2014. Will Request IR to move her left temporary dialysis catheter as she has a functioning right arm fistula for dialysis. She will require placement social worker on  board.   Assessment/Plan:  Discitis  -MRI of lumbar spine evidence of discitis/osteomyelitis of T10-T11 disc, status post CT-guided aspiration. Growing coag-negative staph. Per ID she will get 7 weeks of IV vancomycin during dialysis runs, day 1 will be counted once her temporary dialysis catheter is removed,  have consulted IR to remove her dialysis catheter which was planned to be removed after her toe amputation. Seen by neurosurgery this admission Dr. Lovell Sheehan. Per Dr. Lovell Sheehan no surgical intervention needed.   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 -Consulted Dr. Lajoyce Corners post left fourth and fifth toe amputation on 05/04/2014.   Elevated troponin - Chest pain-free, non-ACS pattern troponin trend. Seen by cardiology no further intervention continue present medical care which includes aspirin, beta blocker due to bradycardia earlier in admission.    ESRD on dialysis -Nephrology on board -Receives HD T, R, Sat.    -Removed temporary left IJ dialysis catheter after toe amputation surgery. She has a functioning right arm AV fistula. IR consulted.   Mobitz type I heart block/bradycardia -Remains asymptomatic, 2D echo 04/2014- EF 55-60%, mild LVH, mild AS, a blocker stopped. Repeat EKG stable.    Diabetes mellitus -Hemoglobin A1c 9.5,on  Levemir increased continue SSI,  and Tradjental  daily.     CBG (last 3)   Recent Labs  05/04/14 1803 05/04/14 2154  05/05/14 0641  GLUCAP 127* 156* 252*     Essential hypertension -BP stable -Atenolol discontinued due to episode of bradycardia   Anemia of renal disease -Stable   Metabolic bone disease -Due to ESRD -Continue Fosrenol   Leukocytosis -Resolved- likely due to discitis, cellulitis to    Anxiety Continue Klonopin  QHS   Obesity BMI 36, follow with PCP   Low Grade fever night of 05/04/2014 after toe surgery.  At present she is afebrile, nontoxic-appearing, on vancomycin, will monitor temperature curve, repeat x-ray, add I S .       DVT Prophylaxis SCDs - Heparin Code Status: Full Family Communication:  No family at bedside Disposition Plan: SNF   Consultants:  ID  Nephrology  Cardiology  Neurosurgery Dr Verdene Lennert Surgery Dr Imogene Burn  IR  Orthopedics Dr. Lajoyce Corners    Procedures:  1.   Joint Aspiration 04/25/13  2. CT guided T-L spine aspiration 04-25-13 by IR  3.  Arteriogram By Vascular Dr Imogene Burn - 04-26-14  1. Right common femoral artery cannulation under ultrasound guidance 2. Placement of catheter in aorta 3. Aortogram 4. Left second order arterial selecgtion 5. Left leg runoff 6. Angioplasty and stenting of Left superficial femoral artery (5 mm x 30 mm self-expanding) 7. Right leg runoff    4.   Left fourth and fifth toe amputation on 05/04/2014 by Dr. Lajoyce Corners    Antibiotics:  Anti-infectives    Start     Dose/Rate Route Frequency Ordered Stop   05/05/14 1200  vancomycin (VANCOCIN) IVPB 1000 mg/200 mL premix     1,000 mg200 mL/hr over 60 Minutes Intravenous Every T-Th-Sa (Hemodialysis) 05/03/14 0844     05/04/14 0600  ceFAZolin (ANCEF) IVPB 2 g/50 mL premix  Status:  Discontinued     2 g100 mL/hr over 30 Minutes Intravenous On call to O.R. 05/02/14 1545 05/04/14 1845   05/03/14 1200  vancomycin (VANCOCIN) 1,250 mg in sodium chloride 0.9 % 250 mL IVPB     1,250 mg250 mL/hr over 60 Minutes Intravenous Every Thu (Hemodialysis) 05/03/14 0844 05/03/14 1300   05/01/14 1200  cefTAZidime (FORTAZ) 2 g in dextrose 5 % 50 mL IVPB  Status:  Discontinued     2 g100 mL/hr over 30 Minutes Intravenous Every T-Th-Sa (Hemodialysis) 04/29/14 1551 04/30/14 0947   04/26/14 1800  cefTAZidime (FORTAZ) 2 g in dextrose 5 % 50 mL IVPB  Status:  Discontinued     2 g100 mL/hr over 30  Minutes Intravenous Every T-Th-S-Su (1800) 04/25/14 1618 04/29/14 1551   04/26/14 1200  vancomycin (VANCOCIN) IVPB 1000 mg/200 mL premix  Status:  Discontinued     1,000 mg200 mL/hr over 60 Minutes Intravenous Every T-Th-Sa (Hemodialysis) 04/25/14 1618 05/03/14 0844   04/25/14 2300  metroNIDAZOLE (FLAGYL) tablet 500 mg  Status:  Discontinued     500 mg Oral Every 8 hours 04/25/14 2208 05/02/14 1056   04/25/14 1700  vancomycin (VANCOCIN) 2,000 mg in sodium chloride 0.9 % 500 mL IVPB     2,000 mg250 mL/hr over 120 Minutes Intravenous  Once 04/25/14 1618 04/25/14 1934   04/25/14 1700  cefTAZidime (FORTAZ) 2 g in dextrose 5 % 50 mL IVPB     2 g100 mL/hr over 30 Minutes Intravenous  Once 04/25/14 1618 04/25/14 1701   04/24/14 1200  vancomycin (VANCOCIN) IVPB 1000 mg/200 mL premix  Status:  Discontinued     1,000 mg200 mL/hr over 60 Minutes Intravenous Every T-Th-Sa (Hemodialysis) 04/21/14 2200 04/22/14 0855   04/22/14  2200  cefTRIAXone (ROCEPHIN) 1 g in dextrose 5 % 50 mL IVPB  Status:  Discontinued     1 g100 mL/hr over 30 Minutes Intravenous Every 24 hours 04/21/14 2211 04/22/14 0855   04/22/14 0645  vancomycin (VANCOCIN) 1,750 mg in sodium chloride 0.9 % 500 mL IVPB  Status:  Discontinued     1,750 mg250 mL/hr over 120 Minutes Intravenous  Once 04/22/14 0634 04/22/14 0855   04/21/14 2200  cefTRIAXone (ROCEPHIN) 2 g in dextrose 5 % 50 mL IVPB  Status:  Discontinued     2 g100 mL/hr over 30 Minutes Intravenous Every 24 hours 04/21/14 2157 04/22/14 0855       Subjective:  Patient in bed, denies any chest abdominal pain, no shortness of breath, does have some pain in the left fourth and fifth toes. No focal weakness. improved left foot pain.  Objective: Filed Vitals:   05/05/14 0830  BP: 148/88  Pulse: 89  Temp:   Resp:    No intake or output data in the 24 hours ending 05/05/14 0946 Filed Weights   05/03/14 0717 05/03/14 1125 05/05/14 0758  Weight: 86.3 kg (190 lb 4.1 oz) 85.1 kg (187  lb 9.8 oz) 85 kg (187 lb 6.3 oz)    Exam:  Gen: Alert Caucasian female in NAD. Chest: clear to auscultate bilaterally, no ronchi or rales  Cardiac: Regular rate and rhythm, holosystolic murmur  Abdomen: soft, non tender, non distended, +bowel sounds. No guarding or rigidity  Extremities: Symmetrical in appearance without edema.  Left foot under bandage post fourth and fifth toe amputation Neurological: Alert awake oriented to time place and person.  Psychiatric: Appears normal.       Data Reviewed: Basic Metabolic Panel:  Recent Labs Lab 05/01/14 1027 05/02/14 0517 05/03/14 0727 05/03/14 2207 05/05/14 0823  NA 135  --  131*  --  130*  K 4.4  --  3.6  --  3.5  CL 95*  --  91*  --  92*  CO2 23  --  26  --  21  GLUCOSE 94  --  394* 445* 244*  BUN 23  --  12  --  10  CREATININE 6.92*  --  5.68*  --  5.39*  CALCIUM 9.4  --  9.0  --  8.7  MG  --  2.4  --   --   --   PHOS  --   --  4.8*  --  4.0   Liver Function Tests:  Recent Labs Lab 05/03/14 0727 05/05/14 0823  ALBUMIN 2.5* 2.4*   No results for input(s): LIPASE, AMYLASE in the last 168 hours. No results for input(s): AMMONIA in the last 168 hours. CBC:  Recent Labs Lab 05/01/14 1027 05/02/14 0517 05/03/14 0727 05/05/14 0355  WBC 8.3 10.0 8.4 6.4  HGB 10.0* 11.0* 9.7* 10.4*  HCT 32.4* 34.9* 31.6* 34.2*  MCV 90.0 92.1 91.3 91.4  PLT 214 155 188 218   Cardiac Enzymes: No results for input(s): CKTOTAL, CKMB, CKMBINDEX, TROPONINI in the last 168 hours. BNP (last 3 results)  Recent Labs  07/10/13 1330  PROBNP 15005.0*   CBG:  Recent Labs Lab 05/04/14 1151 05/04/14 1602 05/04/14 1803 05/04/14 2154 05/05/14 0641  GLUCAP 129* 126* 127* 156* 252*    Recent Results (from the past 240 hour(s))  Culture, routine-abscess     Status: None   Collection Time: 04/25/14 12:52 PM  Result Value Ref Range Status   Specimen Description ABSCESS  Final   Special Requests ABSC T10 T11 DISC  Final   Gram  Stain   Final    FEW WBC PRESENT,BOTH PMN AND MONONUCLEAR NO SQUAMOUS EPITHELIAL CELLS SEEN NO ORGANISMS SEEN Performed at [facility]    Culture   Final    FEW STAPHYLOCOCCUS SPECIES (COAGULASE NEGATIVE) Note: RIFAMPIN AND GENTAMICIN SHOULD NOT BE USED AS SINGLE DRUGS FOR TREATMENT OF STAPH INFECTIONS. Performed at [facility]    Report Status 04/30/2014 FINAL  Final   Organism ID, Bacteria STAPHYLOCOCCUS SPECIES (COAGULASE NEGATIVE)  Final      Susceptibility   Staphylococcus species (coagulase negative) - MIC*    CLINDAMYCIN >=8 RESISTANT Resistant     ERYTHROMYCIN >=8 RESISTANT Resistant     GENTAMICIN >=16 RESISTANT Resistant     LEVOFLOXACIN >=8 RESISTANT Resistant     OXACILLIN >=4 RESISTANT Resistant     PENICILLIN >=0.5 RESISTANT Resistant     RIFAMPIN <=0.5 SENSITIVE Sensitive     TRIMETH/SULFA >=320 RESISTANT Resistant     VANCOMYCIN 2 SENSITIVE Sensitive     TETRACYCLINE 2 SENSITIVE Sensitive     * FEW STAPHYLOCOCCUS SPECIES (COAGULASE NEGATIVE)  Surgical pcr screen     Status: None   Collection Time: 05/02/14  3:54 PM  Result Value Ref Range Status   MRSA, PCR NEGATIVE NEGATIVE Final   Staphylococcus aureus NEGATIVE NEGATIVE Final    Comment:        The Xpert SA Assay (FDA approved for NASAL specimens in patients over 3 years of age), is one component of a comprehensive surveillance program.  Test performance has been validated by [facility] for patients greater than or equal to 79 year old. It is not intended to diagnose infection nor to guide or monitor treatment.      Studies: No results found.  Scheduled Meds: . antiseptic oral rinse  7 mL Mouth Rinse BID  . aspirin  81 mg Oral Daily  . clonazePAM  0.5 mg Oral QHS  . darbepoetin (ARANESP) injection - DIALYSIS  60 mcg Intravenous Q Tue-HD  . docusate sodium  100 mg Oral BID  . feeding supplement (RESOURCE BREEZE)  1 Container Oral TID BM  . heparin subcutaneous  5,000 Units  Subcutaneous 3 times per day  . insulin aspart  0-9 Units Subcutaneous TID WC  . insulin detemir  20 Units Subcutaneous QHS  . lanthanum  1,000 mg Oral TID WC  . lidocaine  20 mL Intradermal Once  . linagliptin  5 mg Oral Daily  . multivitamin  1 tablet Oral QHS  . mupirocin cream   Topical BID  . oxyCODONE-acetaminophen      . pantoprazole  40 mg Oral Daily  . polyethylene glycol  17 g Oral BID  . vancomycin  1,000 mg Intravenous Q T,Th,Sa-HD   Continuous Infusions: . sodium chloride      Principal Problem:   Discitis of lumbar region Active Problems:   Hyponatremia   Diabetes mellitus   HTN (hypertension)   ESRD on dialysis   Diskitis   Anemia of renal disease   Elevated troponin-(0.27)   Bradycardia-transient, resolved   Diabetic foot ulcer   Diabetic infection of left foot   Necrotic toes   Time spent: 10   SINGH,PRASHANT K M.D on 05/05/2014 at 9:46 AM  Between 7am to 7pm - Pager - [phone], After 7pm go to [email or web address] - password [email or web address]  And look for the night coverage person covering me after hours  [facility]  Office  [phone]

## 2014-05-06 DIAGNOSIS — E1169 Type 2 diabetes mellitus with other specified complication: Secondary | ICD-10-CM

## 2014-05-06 DIAGNOSIS — L089 Local infection of the skin and subcutaneous tissue, unspecified: Secondary | ICD-10-CM

## 2014-05-06 LAB — GLUCOSE, CAPILLARY
GLUCOSE-CAPILLARY: 128 mg/dL — AB (ref 70–99)
GLUCOSE-CAPILLARY: 189 mg/dL — AB (ref 70–99)
Glucose-Capillary: 352 mg/dL — ABNORMAL HIGH (ref 70–99)
Glucose-Capillary: 89 mg/dL (ref 70–99)

## 2014-05-06 LAB — CBC
HCT: 31 % — ABNORMAL LOW (ref 36.0–46.0)
Hemoglobin: 9.3 g/dL — ABNORMAL LOW (ref 12.0–15.0)
MCH: 27.6 pg (ref 26.0–34.0)
MCHC: 30 g/dL (ref 30.0–36.0)
MCV: 92 fL (ref 78.0–100.0)
Platelets: 181 10*3/uL (ref 150–400)
RBC: 3.37 MIL/uL — ABNORMAL LOW (ref 3.87–5.11)
RDW: 19.8 % — ABNORMAL HIGH (ref 11.5–15.5)
WBC: 4.9 10*3/uL (ref 4.0–10.5)

## 2014-05-06 MED ORDER — INSULIN DETEMIR 100 UNIT/ML ~~LOC~~ SOLN
30.0000 [IU] | Freq: Every day | SUBCUTANEOUS | Status: DC
  Administered 2014-05-06: 30 [IU] via SUBCUTANEOUS
  Filled 2014-05-06: qty 0.3

## 2014-05-06 MED ORDER — ALPRAZOLAM 0.25 MG PO TABS
0.2500 mg | ORAL_TABLET | Freq: Two times a day (BID) | ORAL | Status: DC | PRN
Start: 2014-05-06 — End: 2014-05-07
  Administered 2014-05-06 – 2014-05-07 (×3): 0.25 mg via ORAL
  Filled 2014-05-06 (×3): qty 1

## 2014-05-06 NOTE — Progress Notes (Signed)
H&P    CC:  Catheter removal   HPI:  This is a 54 y.o. female with hx of ESRD on HD, HTN, IDDM, lumbar DDD who presented to the ED on 04/21/14 with c/o progressive weakness over the past month in both legs.  VVS saw her and she underwent an aortogram as she had infection in left 4th and 5th toes with cellulitis secondary to severe SFA and tibial occlusive dz.  She did have stenting of the left SFA during aortogram.  She was a poor candidate for femoral to popliteal bypass grafting and subsequently underwent amputation of 4th and 5th toes anf 4th and 5th ray amputations by Dr. Lajoyce Corners on 05/04/14.  She is being seen for thoracic spine diskitis by ID.  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 She underwent aspiration of thoracic spine abscess with cx growing Oxacillin Resistant CoNS. Currently on vanco/ceftaz and flagyl.    VVS is consulted for removal of diatek catheter.  The catheter was placed by Dr. Arbie Cookey 12/22/13.  She has undergone superficialization of a right brachiocephalic AVF and ligation of 3 competing branches on 02/16/14 by Dr. Edilia Bo.   She states that this has been used 5x without difficulty.   Past Medical History  Diagnosis Date  . Hypertension   . Anemia   . Diabetes mellitus     Type 2  . Chronic kidney disease     T,Th,Sa dialysis  . GERD (gastroesophageal reflux disease)     protonix for "burping"  . Complication of anesthesia     had trouble being put to sleep and then difficulty waking up. States she doesn't have any issues with Propofol    FH:  Non-Contributory  History   Social History  . Marital Status: Single    Spouse Name: N/A    Number of Children: N/A  . Years of Education: N/A   Occupational History  . Not on file.   Social History Main Topics  . Smoking status: Never Smoker   . Smokeless tobacco: Never Used  . Alcohol Use: No  . Drug Use: No  . Sexual Activity: No   Other Topics Concern   . Not on file   Social History Narrative    Allergies  Allergen Reactions  . Codeine Other (See Comments)    hallucinations    Current Facility-Administered Medications  Medication Dose Route Frequency Provider Last Rate Last Dose  . 0.9 %  sodium chloride infusion   Intravenous Continuous Sebastian Ache, MD      . acetaminophen (TYLENOL) tablet 650 mg  650 mg Oral Q6H PRN Ron Parker, MD      . antiseptic oral rinse (CPC / CETYLPYRIDINIUM CHLORIDE 0.05%) solution 7 mL  7 mL Mouth Rinse BID Leroy Sea, MD   7 mL at 05/05/14 2200  . aspirin chewable tablet 81 mg  81 mg Oral Daily Leroy Sea, MD   81 mg at 05/05/14 1332  . clonazePAM (KLONOPIN) tablet 0.5 mg  0.5 mg Oral QHS Ron Parker, MD   0.5 mg at 05/05/14 2143  . Darbepoetin Alfa (ARANESP) injection 60 mcg  60 mcg Intravenous Q Tue-HD Donald Pore, PA-C   60 mcg at 05/01/14 1414  . docusate sodium (COLACE) capsule 100 mg  100 mg Oral BID Nadara Mustard, MD   100 mg at 05/05/14 2143  . feeding supplement (RESOURCE BREEZE) (RESOURCE BREEZE) liquid 1 Container  1  Container Oral TID BM Donald Poreavid W Zeyfang, PA-C   1 Container at 05/05/14 2000  . heparin injection 5,000 Units  5,000 Units Subcutaneous 3 times per day Leroy SeaPrashant K Singh, MD   5,000 Units at 05/05/14 2143  . HYDROmorphone (DILAUDID) injection 0.5-1 mg  0.5-1 mg Intravenous Q2H PRN Nadara MustardMarcus Duda V, MD   1 mg at 05/06/14 0018  . insulin aspart (novoLOG) injection 0-9 Units  0-9 Units Subcutaneous TID WC Leatha Gildingostin M Gherghe, MD   9 Units at 05/06/14 [phone]  . insulin detemir (LEVEMIR) injection 25 Units  25 Units Subcutaneous QHS Leroy SeaPrashant K Singh, MD   25 Units at 05/05/14 2200  . lanthanum (FOSRENOL) chewable tablet 1,000 mg  1,000 mg Oral TID WC Ron ParkerHarvette C Jenkins, MD   1,000 mg at 05/05/14 1717  . lanthanum (FOSRENOL) chewable tablet 1,000 mg  1,000 mg Oral BID BM PRN Ron ParkerHarvette C Jenkins, MD   1,000 mg at 04/27/14 1506  . lidocaine (XYLOCAINE) 1 % (with pres)  injection 20 mL  20 mL Intradermal Once Lars MageEmma M Collins, PA-C      . linagliptin (TRADJENTA) tablet 5 mg  5 mg Oral Daily Costin Otelia SergeantM Gherghe, MD   5 mg at 05/05/14 2142  . morphine 2 MG/ML injection 2 mg  2 mg Intravenous Q3H PRN Fransisco HertzBrian L Chen, MD   2 mg at 04/27/14 0045  . multivitamin (RENA-VIT) tablet 1 tablet  1 tablet Oral QHS Ron ParkerHarvette C Jenkins, MD   1 tablet at 05/05/14 2142  . mupirocin cream (BACTROBAN) 2 %   Topical BID Leatha Gildingostin M Gherghe, MD      . ondansetron [facility](ZOFRAN) injection 4 mg  4 mg Intravenous Q6H PRN Nadara MustardMarcus Duda V, MD   4 mg at 05/05/14 2106  . oxyCODONE-acetaminophen (PERCOCET/ROXICET) 5-325 MG per tablet 1-2 tablet  1-2 tablet Oral Q4H PRN Nadara MustardMarcus Duda V, MD   2 tablet at 05/06/14 [phone]  . pantoprazole (PROTONIX) EC tablet 40 mg  40 mg Oral Daily Ron ParkerHarvette C Jenkins, MD   40 mg at 05/05/14 2142  . polyethylene glycol (MIRALAX / GLYCOLAX) packet 17 g  17 g Oral BID Leroy SeaPrashant K Singh, MD   17 g at 05/05/14 1331  . vancomycin (VANCOCIN) IVPB 1000 mg/200 mL premix  1,000 mg Intravenous Q T,Th,Sa-HD Ann HeldElizabeth J Martin, RPH   1,000 mg at 05/05/14 1045    ROS:  See HPI  PHYSICAL EXAM  Filed Vitals:   05/06/14 0559  BP: 133/60  Pulse: 89  Temp: 99.5 F (37.5 C)  Resp: 16    Gen:  Well developed well nourished HEENT:  normocephalic Neck:  Left IJ diatek catheter in place Heart:  regular Lungs:  Non-labored Abdomen:  soft Extremities:  + palpable thrill within the right brachiocephalic AVF; left foot bandaged s/p amputation Skin:  No obvious rashes Neuro:  In tact  Lab/X-ray:  Impression: This is a 54 y.o. female who needs diatek catheter removed.  Her SQ heparin was held this am.  Plan:  Removal of left diatek catheter and will send tip for culture.   Doreatha MassedSamantha Kashton Mcartor, PA-C [facility] [phone] 05/06/2014 8:31 AM

## 2014-05-06 NOTE — Progress Notes (Signed)
ANTIBIOTIC CONSULT NOTE - FOLLOW UP  Pharmacy Consult for Vancomycin Indication: Discitis and L toe infection.  Allergies  Allergen Reactions  . Codeine Other (See Comments)    hallucinations    Patient Measurements: Height: 5\' 1"  (154.9 cm) Weight: 184 lb 1.4 oz (83.5 kg) IBW/kg (Calculated) : 47.8 Adjusted Body Weight:   Vital Signs: Temp: 99.5 F (37.5 C) (01/24 0559) Temp Source: Oral (01/24 0559) BP: 133/60 mmHg (01/24 0559) Pulse Rate: 89 (01/24 0559) Intake/Output from previous day: 01/23 0701 - 01/24 0700 In: 360 [P.O.:360] Out: 1555  Intake/Output from this shift:    Labs:  Recent Labs  05/05/14 0355 05/05/14 0823 05/05/14 1845 05/06/14 0510  WBC 6.4  --   --  4.9  HGB 10.4*  --   --  9.3*  PLT 218  --   --  181  CREATININE  --  5.39* 3.41*  --    Estimated Creatinine Clearance: 18.5 mL/min (by C-G formula based on Cr of 3.41).  Recent Labs  05/03/14 1938  VANCORANDOM 20.0    Assessment: 54 yo F admitted 04/21/2014 with c/o back pain, progressive weakness in both legs and a fall. Had received abx at dialysis (type unknown).  AC: Hep sq VTE px cbc stable. Hgb 9.4  ID: Had infected right HD cath in Sept- grew CNS x2 trxt with 6 wks of abx. Now with discitis and L toe infection- MRI does not show osteo of toe >> 4th/5th toe amp 1/22.s/p aspiration of T10-T11 disc. s/p angiography and stenting of left superficial femoral artery 1/14, HD cath removed 1/24 (per ID, that day is D#1 of 6 weeks of vanc) Tmax 99.5, WBC ok. Will need to keep on higher end of range (i.e. 20-25) d/t MIC of 2.  Ceftaz 2g post HD 1/13>>1/18 Flagyl 1/13>1/20  Vanc ordered 1/10- never given; 1/13>> --1/10 VR <5 (on PTA at HD center) --1/21 VR = 20 post HD * Vanc doses: 2g on 1/13 @ 1734, 1g on 1/14 @ HD, 1/16 @HD , 1/19 missed dose, 1/21 1250mg @HD , 1/23 1g at 1045  1/13 disc abscess: CNS, S tetracycline, rifampin and vanc only, MIC 2 1/9 Bcx: Negative  Goal of Therapy:   Vanco level   higher end of range (i.e. 20-25) d/t MIC of 2.  Plan:  - Vanc 1g/HD T-T-Sat. Steady state level this week.   [name], PharmD, BCPS Clinical Staff Pharmacist Pager [phone]  [name], [name] 05/06/2014,12:18 PM

## 2014-05-06 NOTE — Progress Notes (Signed)
  Catheter Removal Procedure Note    Diagnosis: ESRD  Plan:  Remove left diatek catheter  Consent signed:  Yes.   Time out completed:  Yes.   Coumadin:  No. PT/INR (if applicable):   Other labs:  Procedure: 1.  Sterile prepping and draping over catheter area 2. 5 ml 2% lidocaine plain instilled at removal site. 3.  left catheter removed in its entirety with cuff in tact. 4.  Complications:  none 5. Tip of catheter sent for culture:  Yes.     Patient tolerated procedure well:  Yes.   Pressure held, no bleeding noted, dressing applied Instructions given to the pt regarding wound care and bleeding.  Other:  Doreatha MassedSamantha Kaire Stary, PA-C 05/06/2014 9:01 AM

## 2014-05-06 NOTE — Evaluation (Signed)
Physical Therapy Re-Evaluation Patient Details Name: [name] MRN: [id number] DOB: 07/07/1960 Today's Date: 05/06/2014   History of Present Illness  Patient is a 54 y/o female with PMH of history of ESRD on HD,  HTN, IDDM, Lumbar DDD who presents to [facility] with complaints of progressive weakness over [facility] past month in both legs with knee buckling precipitating a fall today (1/9). MRI of lumbar spine evidence of discitis/osteomyelitis of T10-T11 disc. Pt with injury to her Left 5th Toe 1 month ago. IR- aspiration  1/13.Left SFA stent as well.  L toe amputations 1/22  Clinical Impression   Pt admitted with above diagnosis, procedures and now with L lateral toe amputations. Pt currently with functional limitations due to [facility] deficits listed below (see PT Problem List).  Pt will benefit from skilled PT to increase their independence and safety with mobility to allow discharge to [facility] venue listed below.    [name] is quite weak and unsteady on her feet, leading to anxiety; Will add wheelchair goals and lateral scoot transfer goals to care plan.     Follow Up Recommendations SNF;Supervision/Assistance - 24 hour  Will need LOG to receive therapies at SNF. ( I believe that is arranged)    Equipment Recommendations  Wheelchair (measurements PT);Wheelchair cushion (measurements PT) (drop-arm BSC)    Recommendations for Other Services       Precautions / Restrictions Precautions Precautions: Fall In order section, it states NWB LLE; in Ortho note dated 1/24, it states pt can bear weight LLE in postop shoe.      Mobility  Bed Mobility Overal bed mobility: Needs Assistance Bed Mobility: Supine to Sit     Supine to sit: Min assist     General bed mobility comments: Cues for technique; Took incr time for reciprocally scooting hips to EOB  Transfers Overall transfer level: Needs assistance Equipment used: Rolling walker (2 wheeled) Transfers: Sit to/from Stand Sit to  Stand: Max assist;+2 physical assistance         General transfer comment: pt with difficulty transitioning hands from chair to walker. Pt able to WB through L heel in postop shoe; very anxious and unable to take steps; ultimately moved [facility] bed out from under pt and moved chair to pt to sit; cues for hand placement and to control descent  Ambulation/Gait                Stairs            Wheelchair Mobility    Modified Rankin (Stroke Patients Only)       Balance             Standing balance-Leahy Scale: Zero                               Pertinent Vitals/Pain Pain Assessment: 0-10 Pain Score: 6  Pain Location: L foot Pain Descriptors / Indicators: Grimacing Pain Intervention(s): Limited activity within patient's tolerance;Monitored during session;Repositioned    Home Living Family/patient expects to be discharged to:: Skilled nursing facility                      Prior Function                 Hand Dominance        Extremity/Trunk Assessment   Upper Extremity Assessment: Generalized weakness           Lower  Extremity Assessment: Generalized weakness (and new L toe amputations) RLE Deficits / Details: Grossly ~3/5 throughout knee/ankle and 2+/5 hip flexion. LLE Deficits / Details: Grossly ~2/5 hip flexion, 2+/5 knee extension, 2+/5 DF/PF. L toe wrapped.     Communication   Communication: No difficulties  Cognition Arousal/Alertness: Awake/alert Behavior During Therapy: Anxious Overall Cognitive Status: Within Functional Limits for tasks assessed (though quite easily distractible)                      General Comments      Exercises        Assessment/Plan    PT Assessment    PT Diagnosis     PT Problem List    PT Treatment Interventions     PT Goals (Current goals can be found in [facility] Care Plan section) Acute Rehab PT Goals Patient Stated Goal: wants to get home  PT Goal Formulation: With  patient Time For Goal Achievement: 05/23/14 Potential to Achieve Goals: Fair    Frequency Min 2X/week   Barriers to discharge        Co-evaluation               End of Session Equipment Utilized During Treatment: Gait belt Activity Tolerance: Patient limited by fatigue;Patient limited by pain Patient left: in chair;with call bell/phone within reach Nurse Communication: Mobility status;Need for lift equipment         Time: 1028-1100 PT Time Calculation (min) (ACUTE ONLY): 32 min   Charges:     PT Treatments $Therapeutic Activity: 23-37 mins   PT G Codes:        [name] [name] 05/06/2014, 11:46 AM  [name], PT  Acute Rehabilitation Services Pager [phone] Office [phone]

## 2014-05-06 NOTE — Progress Notes (Signed)
PATIENT DETAILS Name: [name] Age: 54 y.o. Sex: female Date of Birth: 06/12/1960 Admit Date: 04/21/2014 Admitting Physician Ron Parker, MD ZOX:[id number] NEVILL, MD  Brief narrative:  Patient is a 54 year old female with a history of end-stage renal disease on hemodialysis, hypertension, insulin-dependent diabetes, who presented to the ED on 1/9 complaining of low back pain and bilateral lower extremity weakness. Also,approximately 1 month prior to admission, she had a injury to her left fifth toe, but due to worsening erythema, nephrologist placed on IV vancomycin with dialysis 4 days prior to this admission. 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 Neurosurgery was consulted on admission, and recommended nonsurgical treatment with IV antibiotics. Subsequently, infectious disease and interventional radiology was consulted, CT-guided aspiration of T10-T11 was performed on 1/13, cultures from aspiration was positive for coag-negative Staphylococcus. Recommendations from infectious disease are for 7 weeks of IV vancomycin during dialysis. Unfortunately, her left fourth and fifth toe continued to get worse during this hospital stay, patient was seen in consult by VVS and orthopedics, subsequently underwent left fourth and fifth to amputation on 05/04/14 by Dr. Lajoyce Corners.  Subjective: Pain at the toe amputation site.No other complaints  Assessment/Plan: Principal Problem:   Discitis of T10-T11:MRI of lumbar spine evidence of discitis/osteomyelitis of T10-T11 disc, status post CT-guided aspiration. Growing coag-negative staph. Per ID she will get 6 weeks of IV vancomycin during dialysis runs, day 1 is 05/06/14-day HD catheter was removed. Patient will need weekly CBC, chemistries, and vancomycin trough levels checked, she will need follow-up with the infectious  disease clinic prior to completion of her antibiotics.  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 Seen by Vascular L Saph Vein stenting by Vascular Dr Imogene Burn 04-26-13, not a bypass candidate, placed on ASA.Consulted Dr. Lajoyce Corners, post left fourth and fifth toe amputation on 05/04/2014. Per orthopedics, patient can put weight on left foot and postop shoe. Will need follow-up with Dr. Lajoyce Corners on discharge.  Elevated troponin: Chest pain-free, non-ACS pattern troponin trend. Seen by cardiology no further intervention continue present medical care which includes aspirin, beta blocker due to bradycardia earlier in admission.  ESRD on dialysis: Undergoes dialysis on TTS, since has a functioning right arm fistula, hemodialysis catheter was removed on 1/24. Seen by nephrology throughout this admission.  Mobitz type I heart block/bradycardia:Remains asymptomatic, 2D echo 04/2014- EF 55-60%, mild LVH, mild AS,  blocker stopped.   Type 2 diabetes: CBGs and controlled, increase Levemir to 30 units, continue SSI and tradjenta  Essential hypertension:BP stable doubt any antihypertensives.Atenolol discontinued due to episode of bradycardia  Anemia of renal disease: Hemoglobin stable, continue periodic CBC monitoring while outpatient.  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 Counseled regarding importance of weight loss  Disposition: Remain inpatient-SNF on 1/25  Antibiotics:  See below   Anti-infectives    Start     Dose/Rate Route Frequency Ordered Stop   05/05/14 1200  vancomycin (VANCOCIN) IVPB 1000 mg/200 mL premix     1,000 mg200 mL/hr over 60 Minutes Intravenous Every T-Th-Sa (Hemodialysis) 05/03/14 0844     05/04/14 0600  ceFAZolin (ANCEF) IVPB 2 g/50 mL  premix  Status:  Discontinued  2 g100 mL/hr over 30 Minutes Intravenous On call to O.R. 05/02/14 1545 05/04/14 1845   05/03/14 1200  vancomycin (VANCOCIN) 1,250 mg in sodium chloride 0.9 % 250 mL IVPB     1,250 mg250 mL/hr over 60 Minutes Intravenous Every Thu (Hemodialysis) 05/03/14 0844 05/03/14 1300   05/01/14 1200  cefTAZidime (FORTAZ) 2 g in dextrose 5 % 50 mL IVPB  Status:  Discontinued     2 g100 mL/hr over 30 Minutes Intravenous Every T-Th-Sa (Hemodialysis) 04/29/14 1551 04/30/14 0947   04/26/14 1800  cefTAZidime (FORTAZ) 2 g in dextrose 5 % 50 mL IVPB  Status:  Discontinued     2 g100 mL/hr over 30 Minutes Intravenous Every T-Th-S-Su (1800) 04/25/14 1618 04/29/14 1551   04/26/14 1200  vancomycin (VANCOCIN) IVPB 1000 mg/200 mL premix  Status:  Discontinued     1,000 mg200 mL/hr over 60 Minutes Intravenous Every T-Th-Sa (Hemodialysis) 04/25/14 1618 05/03/14 0844   04/25/14 2300  metroNIDAZOLE (FLAGYL) tablet 500 mg  Status:  Discontinued     500 mg Oral Every 8 hours 04/25/14 2208 05/02/14 1056   04/25/14 1700  vancomycin (VANCOCIN) 2,000 mg in sodium chloride 0.9 % 500 mL IVPB     2,000 mg250 mL/hr over 120 Minutes Intravenous  Once 04/25/14 1618 04/25/14 1934   04/25/14 1700  cefTAZidime (FORTAZ) 2 g in dextrose 5 % 50 mL IVPB     2 g100 mL/hr over 30 Minutes Intravenous  Once 04/25/14 1618 04/25/14 1701   04/24/14 1200  vancomycin (VANCOCIN) IVPB 1000 mg/200 mL premix  Status:  Discontinued     1,000 mg200 mL/hr over 60 Minutes Intravenous Every T-Th-Sa (Hemodialysis) 04/21/14 2200 04/22/14 0855   04/22/14 2200  cefTRIAXone (ROCEPHIN) 1 g in dextrose 5 % 50 mL IVPB  Status:  Discontinued     1 g100 mL/hr over 30 Minutes Intravenous Every 24 hours 04/21/14 2211 04/22/14 0855   04/22/14 0645  vancomycin (VANCOCIN) 1,750 mg in sodium chloride 0.9 % 500 mL IVPB  Status:  Discontinued     1,750 mg250 mL/hr over 120 Minutes Intravenous  Once 04/22/14 0634 04/22/14 0855   04/21/14 2200   cefTRIAXone (ROCEPHIN) 2 g in dextrose 5 % 50 mL IVPB  Status:  Discontinued     2 g100 mL/hr over 30 Minutes Intravenous Every 24 hours 04/21/14 2157 04/22/14 0855      DVT Prophylaxis: Prophylactic Heparin  Code Status: Full code   Family Communication None at bedside  Procedures: 1.Joint Aspiration 04/25/13  2. CT guided T-L spine aspiration 04-25-13 by IR  3. Arteriogram By Vascular Dr Imogene Burn - 04-26-14  4. Left fourth and fifth toe amputation on 05/04/2014 by Dr. Lajoyce Corners  CONSULTS:  cardiology, ID, orthopedic surgery and vascular surgery  Time spent 40 minutes-which includes 50% of the time with face-to-face with patient/ family and coordinating care related to the above assessment and plan.  MEDICATIONS: Scheduled Meds: . antiseptic oral rinse  7 mL Mouth Rinse BID  . aspirin  81 mg Oral Daily  . clonazePAM  0.5 mg Oral QHS  . darbepoetin (ARANESP) injection - DIALYSIS  60 mcg Intravenous Q Tue-HD  . docusate sodium  100 mg Oral BID  . feeding supplement (RESOURCE BREEZE)  1 Container Oral TID BM  . heparin subcutaneous  5,000 Units Subcutaneous 3 times per day  . insulin aspart  0-9 Units Subcutaneous TID WC  . insulin detemir  25 Units Subcutaneous QHS  . lanthanum  1,000 mg Oral TID  WC  . lidocaine  20 mL Intradermal Once  . linagliptin  5 mg Oral Daily  . multivitamin  1 tablet Oral QHS  . mupirocin cream   Topical BID  . pantoprazole  40 mg Oral Daily  . polyethylene glycol  17 g Oral BID  . vancomycin  1,000 mg Intravenous Q T,Th,Sa-HD   Continuous Infusions: . sodium chloride     PRN Meds:.acetaminophen **OR** [DISCONTINUED] acetaminophen, HYDROmorphone (DILAUDID) injection, lanthanum, morphine injection, [DISCONTINUED] ondansetron **OR** ondansetron (ZOFRAN) IV, oxyCODONE-acetaminophen    PHYSICAL EXAM: Vital signs in last 24 hours: Filed Vitals:   05/05/14 1205 05/05/14 1528 05/05/14 2039 05/06/14 0559  BP: 119/95 123/53 127/53 133/60  Pulse: 95  98 97 89  Temp: 99.3 F (37.4 C) 98.7 F (37.1 C) 99.1 F (37.3 C) 99.5 F (37.5 C)  TempSrc: Oral Oral Oral Oral  Resp: 20 18 16 16   Height:      Weight: 83.5 kg (184 lb 1.4 oz)     SpO2: 95% 100% 100% 96%    Weight change:  Filed Weights   05/03/14 1125 05/05/14 0758 05/05/14 1205  Weight: 85.1 kg (187 lb 9.8 oz) 85 kg (187 lb 6.3 oz) 83.5 kg (184 lb 1.4 oz)   Body mass index is 34.8 kg/(m^2).   Gen Exam: Awake and alert with clear speech.   Neck: Supple, No JVD.   Chest: B/L Clear.   CVS: S1 S2 Regular, no murmurs.  Abdomen: soft, BS +, non tender, non distended.  Extremities: no edema, lower extremities warm to touch.Left foot under bandage post fourth and fifth toe amputation Neurologic: Non Focal.   Skin: No Rash.   Wounds: N/A.    Intake/Output from previous day:  Intake/Output Summary (Last 24 hours) at 05/06/14 1108 Last data filed at 05/06/14 0700  Gross per 24 hour  Intake    360 ml  Output   1555 ml  Net  -1195 ml     LAB RESULTS: CBC  Recent Labs Lab 05/01/14 1027 05/02/14 0517 05/03/14 0727 05/05/14 0355 05/06/14 0510  WBC 8.3 10.0 8.4 6.4 4.9  HGB 10.0* 11.0* 9.7* 10.4* 9.3*  HCT 32.4* 34.9* 31.6* 34.2* 31.0*  PLT 214 155 188 218 181  MCV 90.0 92.1 91.3 91.4 92.0  MCH 27.8 29.0 28.0 27.8 27.6  MCHC 30.9 31.5 30.7 30.4 30.0  RDW 20.6* 21.3* 20.4* 20.3* 19.8*    Chemistries   Recent Labs Lab 05/01/14 1027 05/02/14 0517 05/03/14 0727 05/03/14 2207 05/05/14 0823 05/05/14 1845  NA 135  --  131*  --  130* 133*  K 4.4  --  3.6  --  3.5 3.5  CL 95*  --  91*  --  92* 97  CO2 23  --  26  --  21 27  GLUCOSE 94  --  394* 445* 244* 299*  BUN 23  --  12  --  10 7  CREATININE 6.92*  --  5.68*  --  5.39* 3.41*  CALCIUM 9.4  --  9.0  --  8.7 8.6  MG  --  2.4  --   --   --  2.0    CBG:  Recent Labs Lab 05/05/14 0641 05/05/14 1330 05/05/14 1657 05/05/14 2043 05/06/14 0603  GLUCAP 252* 108* 189* 286* 352*    GFR Estimated  Creatinine Clearance: 18.5 mL/min (by C-G formula based on Cr of 3.41).  Coagulation profile No results for input(s): INR, PROTIME in the last 168 hours.  Cardiac Enzymes No results for input(s): CKMB, TROPONINI, MYOGLOBIN in the last 168 hours.  Invalid input(s): CK  Invalid input(s): POCBNP No results for input(s): DDIMER in the last 72 hours. No results for input(s): HGBA1C in the last 72 hours. No results for input(s): CHOL, HDL, LDLCALC, TRIG, CHOLHDL, LDLDIRECT in the last 72 hours. No results for input(s): TSH, T4TOTAL, T3FREE, THYROIDAB in the last 72 hours.  Invalid input(s): FREET3 No results for input(s): VITAMINB12, FOLATE, FERRITIN, TIBC, IRON, RETICCTPCT in the last 72 hours. No results for input(s): LIPASE, AMYLASE in the last 72 hours.  Urine Studies No results for input(s): UHGB, CRYS in the last 72 hours.  Invalid input(s): UACOL, UAPR, USPG, UPH, UTP, UGL, UKET, UBIL, UNIT, UROB, ULEU, UEPI, UWBC, URBC, UBAC, CAST, UCOM, BILUA  MICROBIOLOGY: Recent Results (from the past 240 hour(s))  Surgical pcr screen     Status: None   Collection Time: 05/02/14  3:54 PM  Result Value Ref Range Status   MRSA, PCR NEGATIVE NEGATIVE Final   Staphylococcus aureus NEGATIVE NEGATIVE Final    Comment:        The Xpert SA Assay (FDA approved for NASAL specimens in patients over 68 years of age), is one component of a comprehensive surveillance program.  Test performance has been validated by [facility] for patients greater than or equal to 84 year old. It is not intended to diagnose infection nor to guide or monitor treatment.     RADIOLOGY STUDIES/RESULTS: Mr Lumbar Spine Wo Contrast  04/21/2014   CLINICAL DATA:  Worsening back pain and 1 month of bilateral leg weakness, worse today. Fall.  EXAM: MRI LUMBAR SPINE WITHOUT CONTRAST  TECHNIQUE: Multiplanar, multisequence MR imaging of the lumbar spine was performed. No intravenous contrast was administered.  COMPARISON:   Chest radiographs 07/14/2013. Lumbar spine MRI 05/13/2012.  FINDINGS: There is minimal right convex curvature at the thoracolumbar junction. There is no significant listhesis. The visualized portion of the lower thoracic spine demonstrates complete loss of disc space height at T10-11 which is new from prior chest radiographs and was not included on the prior MRI. There are extensive adjacent marrow changes in the T10 and T11 vertebral bodies, predominantly intermediate to low T1 signal intensity and mixed T2 hypo- and hyperintensity. STIR images demonstrate mild edema along the endplates. There does not appear to be a large amount of fluid signal in the disc space, although evaluation is somewhat limited by the essentially complete disc space height loss. There is mild anterior wedging of the T10 vertebral body due to superior endplate compression/ resorption. There is also mild depression versus erosion of the T11 superior endplate. There is circumferential displacement of disc material/bone/other soft tissue at the T10-11 disc space level including into the ventral spinal canal. Material in the ventral epidural space measures 6 mm in AP thickness and, together with facet arthrosis, results in moderate spinal stenosis with mild-to-moderate impression on the spinal cord. There is also mild right and moderate left neural foraminal stenosis. No discrete paravertebral fluid collection is identified on this unenhanced study.  Vertebral body heights in the lumbar spine are preserved. Multilevel disc desiccation is present, greatest from L3-4 to L5-S1. There is moderate to severe disc space narrowing at L5-S1, similar to the prior MRI. Mild degenerative marrow changes are present L5-S1 and. Conus medullaris is normal in signal and terminates at L1.  T11-12: Small right foraminal disc protrusion without significant stenosis.  T12-L1:  Negative.  L1-2:  Negative.  L2-3:  Negative.  L3-4: Mild disc bulging asymmetric to the  left without significant stenosis, unchanged.  L4-5: Mild circumferential disc bulging, superimposed right central disc extrusion, and facet and ligamentum flavum hypertrophy results in moderate to severe right and moderate left lateral recess stenosis, mild-to-moderate spinal stenosis, and moderate right neural foraminal stenosis, similar to the prior study.  L5-S1: Prior right hemilaminectomy. Left central disc protrusion results in mild to moderate left lateral recess stenosis, not significantly changed. No spinal canal stenosis.  IMPRESSION: 1. Complete loss of disc space height at T10-11 with endplate erosion/depression and extensive surrounding marrow changes. There is circumferential displacement of material/phlegmon including into the ventral epidural space with resultant moderate spinal stenosis. These findings are new from 07/14/2013 chest radiographs and are concerning for discitis/osteomyelitis. Other potential considerations might include rapidly progressive degenerative disc disease and associated mild compression fractures or dialysis related amyloid spondyloarthropathy. 2. Unchanged moderate disc degeneration in the lower lumbar spine as above. These results were called by telephone at the time of interpretation on 04/21/2014 at 8:09 pm to [facility] Center[facility]NAH MUTHERSBAUGH , who verbally acknowledged these results.   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 Injury at base of fifth toe. Evaluate for osteomyelitis. Initial encounter.  EXAM: MRI OF THE LEFT FOREFOOT WITHOUT CONTRAST  TECHNIQUE: Multiplanar, multisequence MR imaging was performed. No intravenous contrast was administered.  COMPARISON:  Radiographs 04/21/2014.  FINDINGS: Examination is mildly motion degraded. There is no evidence of acute fracture, dislocation, bone destruction or marrow edema. The toes and  metatarsal phalangeal joints appear normal aside from minimal degenerative changes at the first MTP joint. The alignment is normal at the Lisfranc joint.  There is generalized edema throughout the subcutaneous fat and muscles of the forefoot without focal fluid collection. There is a small amount of fluid surrounding the flexor tendons. No skin ulceration identified.  IMPRESSION: 1. No evidence of osteomyelitis within the left forefoot. 2. 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   Electronically Signed   By: Roxy HorsemanBill  Veazey M.D.   On: 04/24/2014 13:32   Ir Fluoro Guide Ndl Plmt / Bx  04/26/2014   CLINICAL DATA:  Severe low back pain secondary to 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  EXAM: IR FLUORO GUIDE NEEDLE PLACEMENT /BIOPSY  ANESTHESIA/SEDATION: Conscious sedation.  MEDICATIONS: Versed 1 mg IV.  Fentanyl 50 mcg IV.  CONTRAST:  None.  PROCEDURE: Following a full explanation of the procedure along with the potential associated complications, an informed witnessed consent was obtained.  The patient was placed prone of early fluoroscopic table.  The skin overlying the thoracic region was then prepped and draped in the usual sterile fashion. The skin entry site overlying the right paramedian T10-T11 disc space was then infiltrated with 0.25% bupivacaine.  Using biplane intermittent fluoroscopy, a 22 gauge Franseen needle was then advanced into the T10-11 disk space without difficulty.  Using a 20 mL syringe, aspirate was obtained for analysis. A second pass was then made in a similar fashion with a new sterile needle. Using a 20 mL syringe, bloody aspirate was obtained and sent for microbiologic analysis.  A total of 2 mL of blood stained fluid was obtained.  Hemostasis was achieved at the skin entry site. The patient tolerated the procedure well.  COMPLICATIONS: None immediate.  FINDINGS: Status post fluoroscopic guided  disc aspiration at T10-T11 for discitis.  IMPRESSION: As above.   Electronically Signed   By: Julieanne Cotton M.D.   On: 04/26/2014 10:01   Dg Chest Port 1 View  05/05/2014   CLINICAL DATA:  Fever.  EXAM: PORTABLE CHEST - 1 VIEW  COMPARISON:  04/23/2014  FINDINGS: Stable appearance of left-sided tunneled dialysis catheter. Minimal atelectasis present at the right lung base. There is no evidence of pulmonary edema, consolidation, pneumothorax, nodule or pleural fluid. The heart size is stable and normal.  IMPRESSION: Mild right basilar atelectasis.  No active disease.   Electronically Signed   By: Irish Lack M.D.   On: 05/05/2014 13:15   Dg Chest Port 1v Same Day  04/23/2014   CLINICAL DATA:  Shortness of breath. End-stage renal disease. Status post dialysis today.  EXAM: PORTABLE CHEST - 1 VIEW SAME DAY  COMPARISON:  12/22/2013  FINDINGS: Heart size is normal. Both lungs are clear. No evidence of pleural effusion. Left jugular dual-lumen central venous dialysis catheter remains in place.  IMPRESSION: No acute findings.   Electronically Signed   By: Myles Rosenthal M.D.   On: 04/23/2014 21:50   Dg Foot Complete Left  04/21/2014   CLINICAL DATA:  54 year old female with history of trauma from a fall 1 week ago with wound and pain in the left little toe.  EXAM: LEFT FOOT - COMPLETE 3+ VIEW  COMPARISON:  No priors.  FINDINGS: Three views of the left foot are slightly limited by extensive motion on the frontal projection. With these limitations in mind, there is no destructive lytic lesion in the left fifth toe to suggest osteomyelitis. No acute displaced fracture, subluxation or dislocation. Numerous vascular calcifications are noted. Small plantar calcaneal spur.  IMPRESSION: 1. No acute radiographic abnormality of the left foot. Specifically, no signs of osteomyelitis in the left fifth toe. 2. Atherosclerosis.   Electronically Signed   By: Trudie Reed M.D.   On: 04/21/2014 17:29    Jeoffrey Massed,  MD  [facility] Pager:[phone]  If 7PM-7AM, please contact night-coverage [email or web address] Password [email or web address] 05/06/2014, 11:08 AM   LOS: 15 days

## 2014-05-06 NOTE — Clinical Social Work Note (Signed)
CSW spoke with Fransisco Beau of [facility]. Fransisco Beau questioned if patient is still on Rocephin. CSW spoke with patient's RN who is to follow-up. CSW met with patient who states she receives dialysis T/TH/Sat at [facility] in [place] and would like to receive it at a [facility] if it is available in [place]. Patient then began to cry as she expressed concern with family having to travel "so far," to visit her in [place] and having limited SNF choices. CSW and PT provided appropriate emotional support and the benefits of going to a SNF. Patient is agreeable to SNF at [facility] for a "short time," and wants to be able to transfer from the dialysis center in [place] back to [facility] in [facility]). CSW to continue to follow.  [place], [place] Weekend Clinical Social Worker [phone]

## 2014-05-06 NOTE — Progress Notes (Signed)
Patient ID: [name], female   DOB: 09/24/1960, 54 y.o.   MRN: [id number] Left foot dressing is clean, dry, and intact.  Doing well overall.  Had dialysis yesterday.  Needs to be up with PT.  Can put weight on left foot in post-op shoe.

## 2014-05-07 ENCOUNTER — Encounter (HOSPITAL_COMMUNITY): Payer: Self-pay | Admitting: Orthopedic Surgery

## 2014-05-07 DIAGNOSIS — E08311 Diabetes mellitus due to underlying condition with unspecified diabetic retinopathy with macular edema: Secondary | ICD-10-CM

## 2014-05-07 LAB — GLUCOSE, CAPILLARY
GLUCOSE-CAPILLARY: 73 mg/dL (ref 70–99)
Glucose-Capillary: 43 mg/dL — CL (ref 70–99)
Glucose-Capillary: 50 mg/dL — ABNORMAL LOW (ref 70–99)
Glucose-Capillary: 68 mg/dL — ABNORMAL LOW (ref 70–99)
Glucose-Capillary: 68 mg/dL — ABNORMAL LOW (ref 70–99)

## 2014-05-07 MED ORDER — ALTEPLASE 2 MG IJ SOLR: 2.0000 mg | Freq: Once | INTRAMUSCULAR | Status: DC | PRN

## 2014-05-07 MED ORDER — POLYETHYLENE GLYCOL 3350 17 G PO PACK: 17.0000 g | PACK | Freq: Two times a day (BID) | ORAL | Status: AC

## 2014-05-07 MED ORDER — HEPARIN SODIUM (PORCINE) 1000 UNIT/ML DIALYSIS
1000.0000 [IU] | INTRAMUSCULAR | Status: DC | PRN
Start: 2014-05-07 — End: 2014-05-07

## 2014-05-07 MED ORDER — LIDOCAINE-PRILOCAINE 2.5-2.5 % EX CREA
1.0000 "application " | TOPICAL_CREAM | CUTANEOUS | Status: DC | PRN
  Filled 2014-05-07: qty 5

## 2014-05-07 MED ORDER — SODIUM CHLORIDE 0.9 % IV SOLN: 100.0000 mL | INTRAVENOUS | Status: DC | PRN

## 2014-05-07 MED ORDER — VANCOMYCIN HCL IN DEXTROSE 1-5 GM/200ML-% IV SOLN: 1000.0000 mg | INTRAVENOUS | Status: DC

## 2014-05-07 MED ORDER — INSULIN DETEMIR 100 UNIT/ML ~~LOC~~ SOLN
22.0000 [IU] | Freq: Every day | SUBCUTANEOUS | Status: DC
  Filled 2014-05-07: qty 0.22

## 2014-05-07 MED ORDER — PENTAFLUOROPROP-TETRAFLUOROETH EX AERO
INHALATION_SPRAY | CUTANEOUS | Status: AC
  Filled 2014-05-07: qty 103.5

## 2014-05-07 MED ORDER — BOOST / RESOURCE BREEZE PO LIQD: 1.0000 | Freq: Three times a day (TID) | ORAL | Status: AC

## 2014-05-07 MED ORDER — PENTAFLUOROPROP-TETRAFLUOROETH EX AERO: 1.0000 "application " | INHALATION_SPRAY | CUTANEOUS | Status: DC | PRN

## 2014-05-07 MED ORDER — NEPRO/CARBSTEADY PO LIQD
237.0000 mL | ORAL | Status: DC | PRN
  Filled 2014-05-07: qty 237

## 2014-05-07 MED ORDER — CLONAZEPAM 0.5 MG PO TABS: 0.5000 mg | ORAL_TABLET | Freq: Two times a day (BID) | ORAL | Status: AC | PRN

## 2014-05-07 MED ORDER — INSULIN ASPART 100 UNIT/ML ~~LOC~~ SOLN: SUBCUTANEOUS | Status: AC

## 2014-05-07 MED ORDER — LIDOCAINE HCL (PF) 1 % IJ SOLN
5.0000 mL | INTRAMUSCULAR | Status: DC | PRN
Start: 2014-05-07 — End: 2014-05-07

## 2014-05-07 MED ORDER — ASPIRIN 81 MG PO CHEW: 81.0000 mg | CHEWABLE_TABLET | Freq: Every day | ORAL | Status: AC

## 2014-05-07 MED ORDER — DARBEPOETIN ALFA 40 MCG/0.4ML IJ SOSY
PREFILLED_SYRINGE | INTRAMUSCULAR | Status: AC
  Filled 2014-05-07: qty 0.4

## 2014-05-07 MED ORDER — GABAPENTIN 300 MG PO CAPS: 300.0000 mg | ORAL_CAPSULE | Freq: Three times a day (TID) | ORAL | Status: AC

## 2014-05-07 MED ORDER — INSULIN DETEMIR 100 UNIT/ML ~~LOC~~ SOLN: 22.0000 [IU] | Freq: Every day | SUBCUTANEOUS | Status: AC

## 2014-05-07 MED ORDER — CLONAZEPAM 0.5 MG PO TABS: 0.5000 mg | ORAL_TABLET | Freq: Every day | ORAL | Status: AC

## 2014-05-07 MED ORDER — VANCOMYCIN HCL IN DEXTROSE 1-5 GM/200ML-% IV SOLN
1000.0000 mg | INTRAVENOUS | Status: DC
  Administered 2014-05-07: 1000 mg via INTRAVENOUS
  Filled 2014-05-07 (×2): qty 200

## 2014-05-07 MED ORDER — GABAPENTIN 300 MG PO CAPS
300.0000 mg | ORAL_CAPSULE | Freq: Three times a day (TID) | ORAL | Status: DC
  Administered 2014-05-07 (×2): 300 mg via ORAL
  Filled 2014-05-07 (×3): qty 1

## 2014-05-07 MED ORDER — HEPARIN SODIUM (PORCINE) 1000 UNIT/ML DIALYSIS: 2000.0000 [IU] | INTRAMUSCULAR | Status: DC | PRN

## 2014-05-07 MED ORDER — OXYCODONE HCL 5 MG PO TABS: 5.0000 mg | ORAL_TABLET | ORAL | Status: DC | PRN

## 2014-05-07 MED ORDER — DARBEPOETIN ALFA 40 MCG/0.4ML IJ SOSY
40.0000 ug | PREFILLED_SYRINGE | INTRAMUSCULAR | Status: DC
  Administered 2014-05-07: 40 ug via INTRAVENOUS

## 2014-05-07 MED ORDER — INSULIN DETEMIR 100 UNIT/ML ~~LOC~~ SOLN
25.0000 [IU] | Freq: Every day | SUBCUTANEOUS | Status: DC
  Filled 2014-05-07: qty 0.25

## 2014-05-07 MED ORDER — GLUCOSE 40 % PO GEL
ORAL | Status: AC
  Administered 2014-05-07: 37.5 g
  Filled 2014-05-07: qty 1

## 2014-05-07 MED ORDER — OXYCODONE-ACETAMINOPHEN 5-325 MG PO TABS
ORAL_TABLET | ORAL | Status: AC
  Filled 2014-05-07: qty 2

## 2014-05-07 NOTE — Progress Notes (Signed)
Patient requested her Xanax 0.25 mg PO to be given before her travel to SNF.  [employer] [employer] [facility] patient on Doctor, general practicestretcher.  Patient was alert and oriented.  She was some-what anxious and received her Xanax as requested.

## 2014-05-07 NOTE — Procedures (Signed)
I was present at this dialysis session, have reviewed the session itself and made  appropriate changes  Vinson Moselleob Alhaji Mcneal MD (pgr) [phone]    ([phone] 05/07/2014, 3:08 PM

## 2014-05-07 NOTE — Progress Notes (Signed)
Physical Therapy Treatment Patient Details Name: Katherine EvaKimberly J Tvedt MRN: [id number] DOB: 09/06/1960 Today's Date: 05/07/2014    History of Present Illness Patient is a 54 y/o female with PMH of history of ESRD on HD,  HTN, IDDM, Lumbar DDD who presents to the ED with complaints of progressive weakness over the past month in both legs with knee buckling precipitating a fall today (1/9). MRI of lumbar spine evidence of discitis/osteomyelitis of T10-T11 disc. Pt with injury to her Left 5th Toe 1 month ago. IR- aspiration today 1/13.Left SFA stent as well.  L toe amputations 1/22    PT Comments    Lengthy discussion and demonstration of lateral scoot transfer OOB to chair with the goal of giving pt more options/repertoire of movement for functional mobility; She scoots well, and the lateral scoot transfer will likely be a good option for getting up, given her intense anxiety with getting to standing, or stand pivoting;   Continue to agree with dc to SNF for further postacute rehab; Continues to be quite emotional;    Follow Up Recommendations  SNF;Supervision/Assistance - 24 hour     Equipment Recommendations  Wheelchair (measurements PT);Wheelchair cushion (measurements PT) (drop-arm BSC)    Recommendations for Other Services       Precautions / Restrictions Precautions Precautions: Fall    Mobility  Bed Mobility Overal bed mobility: Needs Assistance Bed Mobility: Rolling;Sidelying to Sit Rolling: Mod assist Sidelying to sit: +2 for safety/equipment;Mod assist       General bed mobility comments: Discussed getting up on R side of the bed to take advantage of stronger, non-operative RLE, and to give her more options/repertoire of movement for functional mobility; Opted for log roll for more comfort with transition; needed mod assist to transition sidelie to sit  Transfers Overall transfer level: Needs assistance   Transfers: Lateral/Scoot Transfers          Lateral/Scoot  Transfers: Mod assist General transfer comment: Verbal and demonstrational cues (x2 due to pt distractibilty) for technique; Stpe-by-step cues for weight shift onto RLE especiallyto unweigh hips for scooting; small, inefficient scoots; assist also to keep bed pads straight  Ambulation/Gait                 Stairs            Wheelchair Mobility    Modified Rankin (Stroke Patients Only)       Balance     Sitting balance-Leahy Scale: Good                              Cognition Arousal/Alertness: Awake/alert Behavior During Therapy: Anxious Overall Cognitive Status: Within Functional Limits for tasks assessed (though quite easily distractible)                      Exercises      General Comments        Pertinent Vitals/Pain Pain Assessment: Faces Pain Score: 2  Faces Pain Scale: Hurts even more Pain Location: L foot; pain compounded by emotional reaction Pain Descriptors / Indicators: Grimacing Pain Intervention(s): Monitored during session;Repositioned    Home Living                      Prior Function            PT Goals (current goals can now be found in the care plan section) Acute Rehab PT Goals Patient Stated Goal:  wants to get home  PT Goal Formulation: With patient Time For Goal Achievement: 05/23/14 Potential to Achieve Goals: Fair Progress towards PT goals: Progressing toward goals    Frequency  Min 2X/week    PT Plan Current plan remains appropriate    Co-evaluation             End of Session Equipment Utilized During Treatment:  (bed pad) Activity Tolerance: Other (comment) (Limited by anxiety) Patient left: in chair;with call bell/phone within reach     Time: 1007-1029 PT Time Calculation (min) (ACUTE ONLY): 22 min  Charges:  $Therapeutic Activity: 8-22 mins                    G Codes:      Olen Pel 05/07/2014, 12:55 PM  [facility], [place]  Acute Rehabilitation  Services Pager [phone] Office [phone]

## 2014-05-07 NOTE — Clinical Social Work Note (Addendum)
10:05am- CSW spoke with Primus BravoKammy Brothers at [place] [facility] who states she has been in contact with [facility]y [facility] at [facility] who is assisting with HD transfer.  CSW appreciates support from HD coordinator.  [facility] has [facility].  SNF is aware.  Plan for discharge: Patient to dc today after HD.  Per MD Schertz, patient will be coming off HD around 3pm.  SNF updated and agreeable for pm admission.  Patient to discharge today to [facility] per MD order RN to call report prior to to [phone] Transportation: [facility]  9:46am- CSW spoke with a patient advocate at the patient's current HD facility: [phone] who reports office manager has started this transfer process to the [place] facility:[phone]  CSW contacted the [place] [facility] to inquire of process and of needed paperwork.    DC summary was faxed to [facility] for review.    Vickii PennaGina Daivion Pape, LCSWA [phone]  Psychiatric & Orthopedics (5N 1-16) Clinical Social Worker

## 2014-05-07 NOTE — Progress Notes (Signed)
Patient ID: [name], female   DOB: 12/12/1960, 54 y.o.   MRN: [id number] Postoperative day 3 left foot fourth and fifth ray amputations. Patient's pain complaint is most likely ischemic pain. Orders are written for Neurontin to see if this would help her.

## 2014-05-07 NOTE — Progress Notes (Signed)
Pt CBG remains at 68, however she is sitting up at bedside and is eating her breakfast. She does not have any hypoglycemic symptoms. She is less emotional this morning.

## 2014-05-07 NOTE — Progress Notes (Signed)
Spoke with Hospital doctorAmber, Charity fundraiserN at [facility] [place]. She stated the prescription for vancomycin was ok as written currently on discharge paperwork (1g IV qHD TTS for 42 days from 05/06/14).  She stated that the order will shift to the days patient is actually coming in for HD (changed from TTS schedule to MWF schedule).  Evelyne Makepeace D. Sebastian Lurz, PharmD, BCPS Clinical Pharmacist Pager: [phone] 05/07/2014 11:55 AM

## 2014-05-07 NOTE — Progress Notes (Signed)
[facility] called to [facility] spoke to Irrigoneresa

## 2014-05-07 NOTE — Progress Notes (Signed)
Hypoglycemic Event  CBG: 50  Treatment: 3 glucose tabs  Symptoms: None  Follow-up CBG: Time:0655 CBG Result:68   Possible Reasons for Event:   Comments/MD notified:     Hasson Gaspard A  Remember to initiate Hypoglycemia Order Set & complete

## 2014-05-07 NOTE — Progress Notes (Signed)
Hypoglycemic Event  CBG: 43   Treatment: 15 GM carbohydrate snack  Symptoms: None  Follow-up CBG: Time:0625 CBG Result:50  Possible Reasons for Event: increased levemir  Comments/MD notified:no    Ixel Boehning A  Remember to initiate Hypoglycemia Order Set & complete

## 2014-05-07 NOTE — Progress Notes (Signed)
Pt continues to remain emotionally labile. She sleeps between pain medications but when she wakes up, she states her pain is a 7 or 8 and she begins crying. Pain in her foot is a burning pain and her back is painful when she moves. Pain medications appear to relieve her pain.

## 2014-05-07 NOTE — Discharge Summary (Signed)
PATIENT DETAILS Name: [name] Age: 54 y.o. Sex: female Date of Birth: 05/25/1960 MRN: [id number]. Admitting Physician: Ron Parker, MD WGN:[id number] NEVILL, MD  Admit Date: 04/21/2014 Discharge date: 05/07/2014  Recommendations for Outpatient Follow-up:  1. Check Weekly CBC, BMET and Vancomycin trough levels-please fax the results to ID clinic 2. Needs IV Vancomycin with dialysis for 42 days from 05/06/14 3. Please avoid AV Nodal blocking agents  PRIMARY DISCHARGE DIAGNOSIS:  Principal Problem:   Discitis of lumbar region Active Problems:   Hyponatremia   Diabetes mellitus   HTN (hypertension)   ESRD on dialysis   Diskitis   Anemia of renal disease   Elevated troponin-(0.27)   Bradycardia-transient, resolved   Diabetic foot ulcer   Diabetic infection of left foot   Necrotic toes      PAST MEDICAL HISTORY: Past Medical History  Diagnosis Date  . Hypertension   . Anemia   . Diabetes mellitus     Type 2  . Chronic kidney disease     T,Th,Sa dialysis  . GERD (gastroesophageal reflux disease)     protonix for "burping"  . Complication of anesthesia     had trouble being put to sleep and then difficulty waking up. States she doesn't have any issues with Propofol    DISCHARGE MEDICATIONS: Current Discharge Medication List    START taking these medications   Details  aspirin 81 MG chewable tablet Chew 1 tablet (81 mg total) by mouth daily.    feeding supplement, RESOURCE BREEZE, (RESOURCE BREEZE) LIQD Take 1 Container by mouth 3 (three) times daily between meals. Refills: 0    gabapentin (NEURONTIN) 300 MG capsule Take 1 capsule (300 mg total) by mouth 3 (three) times daily.    oxyCODONE (ROXICODONE) 5 MG immediate release tablet Take 1-2 tablets (5-10 mg total) by mouth every 4 (four) hours as needed for severe pain. Qty: 30 tablet, Refills: 0    polyethylene glycol (MIRALAX / GLYCOLAX) packet Take 17 g by mouth 2 (two) times daily. Qty: 14  each, Refills: 0    vancomycin (VANCOCIN) 1 GM/200ML SOLN Inject 200 mLs (1,000 mg total) into the vein Every Tuesday,Thursday,and Saturday with dialysis. For 42 days from 05/06/14 Qty: 4000 mL      CONTINUE these medications which have CHANGED   Details  !! clonazePAM (KLONOPIN) 0.5 MG tablet Take 1 tablet (0.5 mg total) by mouth at bedtime. Qty: 20 tablet, Refills: 0    !! clonazePAM (KLONOPIN) 0.5 MG tablet Take 1 tablet (0.5 mg total) by mouth 2 (two) times daily as needed for anxiety. Qty: 20 tablet, Refills: 0    insulin aspart (NOVOLOG) 100 UNIT/ML injection 0-9 Units, Subcutaneous, 3 times daily with meals CBG < 70: implement hypoglycemia protocol CBG 70 - 120: 0 units CBG 121 - 150: 1 unit CBG 151 - 200: 2 units CBG 201 - 250: 3 units CBG 251 - 300: 5 units CBG 301 - 350: 7 units CBG 351 - 400: 9 units CBG > 400: call MD Qty: 10 mL, Refills: 11    insulin detemir (LEVEMIR) 100 UNIT/ML injection Inject 0.22 mLs (22 Units total) into the skin at bedtime. Usually takes around midnight Refills: 11     !! - Potential duplicate medications found. Please discuss with provider.    CONTINUE these medications which have NOT CHANGED   Details  b complex-vitamin c-folic acid (NEPHRO-VITE) 0.8 MG TABS tablet Take 1 tablet by mouth at bedtime.    famotidine (PEPCID)  20 MG tablet Take 20 mg by mouth at bedtime.     lanthanum (FOSRENOL) 1000 MG chewable tablet Chew 1,000 mg by mouth See admin instructions. Takes with meals and snacks    linagliptin (TRADJENTA) 5 MG TABS tablet Take 1 tablet (5 mg total) by mouth daily. Qty: 30 tablet, Refills: 5    pantoprazole (PROTONIX) 40 MG tablet Take 1 tablet (40 mg total) by mouth daily. Qty: 30 tablet, Refills: 5    promethazine (PHENERGAN) 25 MG tablet Take 1 tablet (25 mg total) by mouth every 6 (six) hours as needed for nausea or vomiting. Qty: 30 tablet, Refills: 0    Turmeric Curcumin 500 MG CAPS Take 500 mg by mouth daily.      doxercalciferol (HECTOROL) 4 MCG/2ML injection Inject 2 mLs (4 mcg total) into the vein Every Tuesday,Thursday,and Saturday with dialysis. Qty: 2 mL, Refills: 0    ferric gluconate 125 mg in sodium chloride 0.9 % 100 mL Inject 125 mg into the vein Every Tuesday,Thursday,and Saturday with dialysis. Qty: 125 ampule, Refills: 0      STOP taking these medications     atenolol (TENORMIN) 25 MG tablet      HYDROcodone-acetaminophen (NORCO/VICODIN) 5-325 MG per tablet      ibuprofen (ADVIL,MOTRIN) 200 MG tablet      oxyCODONE-acetaminophen (ROXICET) 5-325 MG per tablet      zaleplon (SONATA) 10 MG capsule      iron sucrose in sodium chloride 0.9 % 100 mL         ALLERGIES:   Allergies  Allergen Reactions  . Codeine Other (See Comments)    hallucinations    BRIEF HPI:  See H&P, Labs, Consult and Test reports for all details in brief, Patient is a 54 year old female with a history of end-stage renal disease on hemodialysis, hypertension, insulin-dependent diabetes, who presented to the ED on 1/9 complaining of low back pain and bilateral lower extremity weakness. Also,approximately 1 month prior to admission, she had a injury to her left fifth toe, but due to worsening erythema, nephrologist placed on IV vancomycin with dialysis 4 days prior to this admission. 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 Fall.  EXAM: MRI LUMBAR SPINE WITHOUT CONTRAST  TECHNIQUE: Multiplanar, multisequence MR imaging of the lumbar spine was performed. No intravenous contrast was administered.  COMPARISON:  Chest radiographs 07/14/2013. Lumbar spine MRI 05/13/2012.  FINDINGS: There is minimal right convex  curvature at the thoracolumbar junction. There is no significant listhesis. The visualized portion of the lower thoracic spine demonstrates complete loss of disc space height at T10-11 which is new from prior chest radiographs and was not included on the prior MRI. There are extensive adjacent marrow changes in the T10 and T11 vertebral bodies, predominantly intermediate to low T1 signal intensity and mixed T2 hypo- and hyperintensity. STIR images demonstrate mild edema along the endplates. There does not appear to be a large amount of fluid signal in the disc space, although evaluation is somewhat limited by the essentially complete disc space height loss. There is mild anterior wedging of the T10 vertebral body due to superior endplate compression/ resorption. There is also mild depression versus erosion of the T11 superior endplate. There is circumferential displacement of  disc material/bone/other soft tissue at the T10-11 disc space level including into the ventral spinal canal. Material in the ventral epidural space measures 6 mm in AP thickness and, together with facet arthrosis, results in moderate spinal stenosis with mild-to-moderate impression on the spinal cord. There is also mild right and moderate left neural foraminal stenosis. No discrete paravertebral fluid collection is identified on this unenhanced study.  Vertebral body heights in the lumbar spine are preserved. Multilevel disc desiccation is present, greatest from L3-4 to L5-S1. There is moderate to severe disc space narrowing at L5-S1, similar to the prior MRI. Mild degenerative marrow changes are present L5-S1 and. Conus medullaris is normal in signal and terminates at L1.  T11-12: Small right foraminal disc protrusion without significant stenosis.  T12-L1:  Negative.  L1-2:  Negative.  L2-3:  Negative.  L3-4: Mild disc bulging asymmetric to the left without significant stenosis, unchanged.  L4-5: Mild circumferential disc bulging, superimposed  right central disc extrusion, and facet and ligamentum flavum hypertrophy results in moderate to severe right and moderate left lateral recess stenosis, mild-to-moderate spinal stenosis, and moderate right neural foraminal stenosis, similar to the prior study.  L5-S1: Prior right hemilaminectomy. Left central disc protrusion results in mild to moderate left lateral recess stenosis, not significantly changed. No spinal canal stenosis.  IMPRESSION: 1. Complete loss of disc space height at T10-11 with endplate erosion/depression and extensive surrounding marrow changes. There is circumferential displacement of material/phlegmon including into the ventral epidural space with resultant moderate spinal stenosis. These findings are new from 07/14/2013 chest radiographs and are concerning for discitis/osteomyelitis. Other potential considerations might include rapidly progressive degenerative disc disease and associated mild compression fractures or dialysis related amyloid spondyloarthropathy. 2. Unchanged moderate disc degeneration in the lower lumbar spine as above. These results were called by telephone at the time of interpretation on 04/21/2014 at 8:09 pm to [facility] M[facility]ERS[facility]UGH , who verbally acknowledged these results.   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 Injury at base of fifth toe. Evaluate for osteomyelitis. Initial encounter.  EXAM: MRI OF THE LEFT FOREFOOT WITHOUT CONTRAST  TECHNIQUE: Multiplanar, multisequence MR imaging was performed. No intravenous contrast was administered.  COMPARISON:  Radiographs 04/21/2014.  FINDINGS: Examination is mildly motion degraded. There is no evidence of acute fracture, dislocation, bone destruction or marrow edema. The toes and metatarsal phalangeal joints appear normal aside from minimal degenerative changes at the first MTP  joint. The alignment is normal at the Lisfranc joint.  There is generalized edema throughout the subcutaneous fat and muscles of the forefoot without focal fluid collection. There is a small amount of fluid surrounding the flexor tendons. No skin ulceration identified.  IMPRESSION: 1. No evidence of osteomyelitis within the left forefoot. 2. 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   Electronically Signed   By: Roxy HorsemanBill  Veazey M.D.   On: 04/24/2014 13:32   Ir Fluoro Guide Ndl Plmt / Bx  04/26/2014   CLINICAL DATA:  Severe low back pain secondary to 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  EXAM: IR FLUORO GUIDE NEEDLE PLACEMENT /BIOPSY  ANESTHESIA/SEDATION: Conscious sedation.  MEDICATIONS: Versed 1 mg IV.  Fentanyl 50 mcg IV.  CONTRAST:  None.  PROCEDURE: Following a full explanation of the procedure along with the potential associated complications, an  informed witnessed consent was obtained.  The patient was placed prone of early fluoroscopic table.  The skin overlying the thoracic region was then prepped and draped in the usual sterile fashion. The skin entry site overlying the right paramedian T10-T11 disc space was then infiltrated with 0.25% bupivacaine.  Using biplane intermittent fluoroscopy, a 22 gauge Franseen needle was then advanced into the T10-11 disk space without difficulty.  Using a 20 mL syringe, aspirate was obtained for analysis. A second pass was then made in a similar fashion with a new sterile needle. Using a 20 mL syringe, bloody aspirate was obtained and sent for microbiologic analysis.  A total of 2 mL of blood stained fluid was obtained.  Hemostasis was achieved at the skin entry site. The patient tolerated the procedure well.  COMPLICATIONS: None immediate.  FINDINGS: Status post fluoroscopic guided disc aspiration at T10-T11 for discitis.  IMPRESSION: As above.   Electronically Signed   By:  Julieanne Cotton M.D.   On: 04/26/2014 10:01   Dg Chest Port 1 View  05/05/2014   CLINICAL DATA:  Fever.  EXAM: PORTABLE CHEST - 1 VIEW  COMPARISON:  04/23/2014  FINDINGS: Stable appearance of left-sided tunneled dialysis catheter. Minimal atelectasis present at the right lung base. There is no evidence of pulmonary edema, consolidation, pneumothorax, nodule or pleural fluid. The heart size is stable and normal.  IMPRESSION: Mild right basilar atelectasis.  No active disease.   Electronically Signed   By: Irish Lack M.D.   On: 05/05/2014 13:15   Dg Chest Port 1v Same Day  04/23/2014   CLINICAL DATA:  Shortness of breath. End-stage renal disease. Status post dialysis today.  EXAM: PORTABLE CHEST - 1 VIEW SAME DAY  COMPARISON:  12/22/2013  FINDINGS: Heart size is normal. Both lungs are clear. No evidence of pleural effusion. Left jugular dual-lumen central venous dialysis catheter remains in place.  IMPRESSION: No acute findings.   Electronically Signed   By: Myles Rosenthal M.D.   On: 04/23/2014 21:50   Dg Foot Complete Left  04/21/2014   CLINICAL DATA:  54 year old female with history of trauma from a fall 1 week ago with wound and pain in the left little toe.  EXAM: LEFT FOOT - COMPLETE 3+ VIEW  COMPARISON:  No priors.  FINDINGS: Three views of the left foot are slightly limited by extensive motion on the frontal projection. With these limitations in mind, there is no destructive lytic lesion in the left fifth toe to suggest osteomyelitis. No acute displaced fracture, subluxation or dislocation. Numerous vascular calcifications are noted. Small plantar calcaneal spur.  IMPRESSION: 1. No acute radiographic abnormality of the left foot. Specifically, no signs of osteomyelitis in the left fifth toe. 2. Atherosclerosis.   Electronically Signed   By: Trudie Reed M.D.   On: 04/21/2014 17:29     PERTINENT LAB RESULTS: CBC:  Recent Labs  05/05/14 0355 05/06/14 0510  WBC 6.4 4.9  HGB 10.4* 9.3*    HCT 34.2* 31.0*  PLT 218 181   CMET CMP     Component Value Date/Time   NA 133* 05/05/2014 1845   K 3.5 05/05/2014 1845   CL 97 05/05/2014 1845   CO2 27 05/05/2014 1845   GLUCOSE 299* 05/05/2014 1845   BUN 7 05/05/2014 1845   CREATININE 3.41* 05/05/2014 1845   CALCIUM 8.6 05/05/2014 1845   PROT 6.5 04/24/2014 0500   ALBUMIN 2.4* 05/05/2014 0823   AST 60* 04/24/2014 0500   ALT 9  04/24/2014 0500   ALKPHOS 118* 04/24/2014 0500   BILITOT 1.0 04/24/2014 0500   GFRNONAA 14* 05/05/2014 1845   GFRAA 16* 05/05/2014 1845    GFR Estimated Creatinine Clearance: 18.5 mL/min (by C-G formula based on Cr of 3.41). No results for input(s): LIPASE, AMYLASE in the last 72 hours. No results for input(s): CKTOTAL, CKMB, CKMBINDEX, TROPONINI in the last 72 hours. Invalid input(s): POCBNP No results for input(s): DDIMER in the last 72 hours. No results for input(s): HGBA1C in the last 72 hours. No results for input(s): CHOL, HDL, LDLCALC, TRIG, CHOLHDL, LDLDIRECT in the last 72 hours. No results for input(s): TSH, T4TOTAL, T3FREE, THYROIDAB in the last 72 hours.  Invalid input(s): FREET3 No results for input(s): VITAMINB12, FOLATE, FERRITIN, TIBC, IRON, RETICCTPCT in the last 72 hours. Coags: No results for input(s): INR in the last 72 hours.  Invalid input(s): PT Microbiology: Recent Results (from the past 240 hour(s))  Surgical pcr screen     Status: None   Collection Time: 05/02/14  3:54 PM  Result Value Ref Range Status   MRSA, PCR NEGATIVE NEGATIVE Final   Staphylococcus aureus NEGATIVE NEGATIVE Final    Comment:        The Xpert SA Assay (FDA approved for NASAL specimens in patients over 59 years of age), is one component of a comprehensive surveillance program.  Test performance has been validated by [facility] for patients greater than or equal to 52 year old. It is not intended to diagnose infection nor to guide or monitor treatment.   Cath Tip Culture     Status:  None (Preliminary result)   Collection Time: 05/06/14  9:38 AM  Result Value Ref Range Status   Specimen Description CATH TIP  Final   Special Requests LEFT UPPER CHEST  Final   Culture   Final    NO GROWTH 1 DAY Performed at [facility]    Report Status PENDING  Incomplete     BRIEF HOSPITAL COURSE:  Brief narrative:  Patient is a 54 year old female with a history of end-stage renal disease on hemodialysis, hypertension, insulin-dependent diabetes, who presented to the ED on 1/9 complaining of low back pain and bilateral lower extremity weakness. Also,approximately 1 month prior to admission, she had a injury to her left fifth toe, but due to worsening erythema, nephrologist placed on IV vancomycin with dialysis 4 days prior to this admission. 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 Neurosurgery was consulted on admission, and recommended nonsurgical treatment with IV antibiotics. Subsequently, infectious disease and interventional radiology was consulted, CT-guided aspiration of T10-T11 was performed on 1/13, cultures from aspiration was positive for coag-negative Staphylococcus. Recommendations from infectious disease are for 7 weeks of IV vancomycin during dialysis. Unfortunately, her left fourth and fifth toe continued to get worse during this hospital stay, patient was seen in consult by [facility] and orthopedics, subsequently underwent left fourth and fifth to amputation on 05/04/14 by Dr. Lajoyce Corners.  Please see below for hospital course by problem list: Discitis of T10-T11:MRI of lumbar spine evidence of discitis/osteomyelitis of T10-T11 disc, status post CT-guided aspiration. Growing coag-negative staph. Per ID she will get 6 weeks of IV vancomycin during dialysis runs, day 1 is 05/06/14 (day HD catheter was removed). Patient will need weekly CBC, chemistries, and  vancomycin trough levels checked, she will need follow-up with the infectious disease clinic prior to completion of  her antibiotics.  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 Seen by Vascular L Saph Vein stenting by Vascular Dr Imogene Burn 04-26-13, not a bypass candidate, placed on ASA.Consulted Dr. Lajoyce Corners, post left fourth and fifth toe amputation on 05/04/2014. Per orthopedics, patient can put weight on left foot in postop shoe and change dressings prn. Will need follow-up with Dr. Lajoyce Corners on discharge.  Elevated troponin: Chest pain-free, non-ACS pattern troponin trend. Seen by cardiology no further intervention continue present medical care which includes aspirin, beta blocker due to bradycardia earlier in admission.  ESRD on dialysis: Undergoes dialysis on TTS, since has a functioning right arm fistula, hemodialysis catheter was removed on 1/24. Seen by nephrology throughout this admission.  Mobitz type I heart block/bradycardia:Remains asymptomatic, 2D echo 04/2014- EF 55-60%, mild LVH, mild AS, blocker stopped. Please avoid AV Nodal blocking agents.  Type 2 diabetes: CBGs and controlled, mildly hypoglycemic on 1/25-have decreased Levemir to 22 units QHS, please continue to optimize Insulin regimen at [facility].  Essential hypertension:BP stable doubt any antihypertensives.Atenolol discontinued due to episode of bradycardia  Anemia of renal disease: Hemoglobin stable, continue periodic CBC monitoring while outpatient.  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 Counseled regarding importance of weight loss   TODAY-DAY OF DISCHARGE:  Subjective:   Alfonse Spruce today has no headache,no chest abdominal pain,no new weakness tingling or  numbness  Objective:   Blood pressure 110/57, pulse 81, temperature 98.6 F (37 C), temperature source Oral, resp. rate 16, height 5\' 1"  (1.549 m), weight 83.5 kg (184 lb 1.4 oz), SpO2 96 %.  Intake/Output Summary (Last 24 hours) at 05/07/14 0904 Last data filed at 05/07/14 0700  Gross per 24 hour  Intake    580 ml  Output      0 ml  Net    580 ml   Filed Weights   05/03/14 1125 05/05/14 0758 05/05/14 1205  Weight: 85.1 kg (187 lb 9.8 oz) 85 kg (187 lb 6.3 oz) 83.5 kg (184 lb 1.4 oz)    Exam Awake Alert, Oriented *3, No new F.N deficits, Normal affect [place].AT,PERRAL Supple Neck,No JVD, No cervical lymphadenopathy appriciated.  Symmetrical Chest wall movement, Good air movement bilaterally, CTAB RRR,No Gallops,Rubs or new Murmurs, No Parasternal Heave +ve B.Sounds, Abd Soft, Non tender, No organomegaly appriciated, No rebound -guarding or rigidity. No Cyanosis, Clubbing or edema, No new Rash or bruise  DISCHARGE CONDITION: Stable  DISPOSITION: SNF  DISCHARGE INSTRUCTIONS:    Activity:  As tolerated with Full fall precautions use walker/cane & assistance as needed  Diet recommendation: Diabetic Diet Heart Healthy diet  Discharge Instructions    Call MD for:  redness, tenderness, or signs of infection (pain, swelling, redness, odor or green/yellow discharge around incision site)    Complete by:  As directed      Diet - low sodium heart healthy    Complete by:  As directed      Diet Carb Modified    Complete by:  As directed      Discharge wound care:    Complete by:  As directed   As needed to left foot     Increase activity slowly    Complete by:  As directed            Follow-up Information  Follow up with DUDA,MARCUS V, MD In 2 weeks.   Specialty:  Orthopedic Surgery   Contact information:   [address] [place] [address] [phone]       Follow up with GATES,ROBERT NEVILL, MD. Schedule an appointment as soon as possible for a visit in 2  weeks.   Specialty:  Internal Medicine   Contact information:   [address] [place] [address] [phone]       Follow up with Judyann Munson, MD. Schedule an appointment as soon as possible for a visit in 2 weeks.   Specialty:  Infectious Diseases   Contact information:   [address] [place] [address] [phone]       Please follow up.   Why:  For Hemordialysis on Tuesday, Thurday and Saturday   Contact information:   [facility]      Total Time spent on discharge equals 45 minutes.  SignedJeoffrey Massed 05/07/2014 9:04 AM

## 2014-05-07 NOTE — Progress Notes (Signed)
[facility] Progress Note  Assessment/Plan: 1. 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 [place]D - [place] - we are setting up HD in [place] for while she is in SNF there  3. Anemia - Hgb +/- 10  Stable on ESA 4. Secondary hyperparathyroidism - calcitriol on hold due to [facility]; on fosrenol 5. HTN/volume - slightly below dry wt, modify dry wt at dc 6. Nutrition - renal diet + resource supplements 7. Gangrenous s/p left 4th and 5th ray amp - Dr. Lajoyce Cornersuda 1/22; s/p Left SFA stent by Dr. Imogene Burnhen for isch LLE 8. Deconditioning - to SNF in [place] 9. DM - per primary   Vinson Moselleob Dedra Matsuo MD pager [phone]    cell [phone] 05/07/2014, 9:00 AM    Subjective:     Objective Filed Vitals:   05/06/14 0559 05/06/14 1531 05/06/14 2102 05/07/14 0606  BP: 133/60 138/60 138/70 110/57  Pulse: 89 97 103 81  Temp: 99.5 F (37.5 C) 100.2 F (37.9 C) 99.5 F (37.5 C) 98.6 F (37 C)  TempSrc: Oral Oral Oral Oral  Resp: 16 18 18 16   Height:      Weight:      SpO2: 96% 100% 96% 96%   Physical Exam General: NAD, some facial puffiness Heart:tachy regular ~100 Lungs: no rales Abdomen: obese soft Extremities: left foot wrpped; 1+ RLE edema Dialysis Access: left IJ cath and right AVF  Dialysis Orders:  TTS [facility] 85 kgs 2K/2ca 4 hr 400/800 6200 Heparin. L IJ cath and R AVF Calcitriol 0.75 Aranesp 25 q week.   Additional Objective Labs: Basic Metabolic Panel:  Recent Labs Lab 05/03/14 0727 05/03/14 2207 05/05/14 0823 05/05/14 1845  NA 131*  --  130* 133*  K 3.6  --  3.5 3.5  CL 91*  --  92* 97  CO2 26  --  21 27  GLUCOSE 394* 445* 244* 299*  BUN 12  --  10 7  CREATININE 5.68*  --  5.39* 3.41*  CALCIUM 9.0  --  8.7 8.6  PHOS 4.8*  --  4.0  --    Liver Function Tests:  Recent Labs Lab 05/03/14 0727 05/05/14 0823  ALBUMIN 2.5* 2.4*   CBC:  Recent Labs Lab 05/01/14 1027 05/02/14 0517 05/03/14 0727 05/05/14 0355 05/06/14 0510   WBC 8.3 10.0 8.4 6.4 4.9  HGB 10.0* 11.0* 9.7* 10.4* 9.3*  HCT 32.4* 34.9* 31.6* 34.2* 31.0*  MCV 90.0 92.1 91.3 91.4 92.0  PLT 214 155 188 218 181   Blood Culture    Component Value Date/Time   SDES CATH TIP 05/06/2014 0938   SPECREQUEST LEFT UPPER CHEST 05/06/2014 0938   CULT  05/06/2014 0938    NO GROWTH 1 DAY Performed at [facility]    REPTSTATUS PENDING 05/06/2014 0938  CBG:  Recent Labs Lab 05/06/14 2105 05/07/14 0608 05/07/14 0628 05/07/14 0653 05/07/14 0740  GLUCAP 189* 43* 50* 68* 68*  Medications: . sodium chloride     . antiseptic oral rinse  7 mL Mouth Rinse BID  . aspirin  81 mg Oral Daily  . clonazePAM  0.5 mg Oral QHS  . darbepoetin (ARANESP) injection - DIALYSIS  60 mcg Intravenous Q Tue-HD  . docusate sodium  100 mg Oral BID  . feeding supplement (RESOURCE BREEZE)  1 Container Oral TID BM  . gabapentin  300 mg Oral TID  . heparin subcutaneous  5,000 Units Subcutaneous 3 times per day  .  insulin aspart  0-9 Units Subcutaneous TID WC  . insulin detemir  22 Units Subcutaneous QHS  . lanthanum  1,000 mg Oral TID WC  . lidocaine  20 mL Intradermal Once  . linagliptin  5 mg Oral Daily  . multivitamin  1 tablet Oral QHS  . mupirocin cream   Topical BID  . pantoprazole  40 mg Oral Daily  . polyethylene glycol  17 g Oral BID  . vancomycin  1,000 mg Intravenous Q T,Th,Sa-HD

## 2014-05-08 DIAGNOSIS — N186 End stage renal disease: Secondary | ICD-10-CM

## 2014-05-08 NOTE — Addendum Note (Signed)
Addendum  created 05/08/14 1757 by Gaylan GeroldJohn R Rahshawn Remo, MD   Modules edited: Anesthesia Attestations

## 2014-05-09 LAB — CATH TIP CULTURE: Culture: NO GROWTH

## 2014-05-18 ENCOUNTER — Telehealth: Payer: Self-pay | Admitting: *Deleted

## 2014-05-18 NOTE — Telephone Encounter (Addendum)
[facility] called to confirm fax number for lab results.  They are currently drawing a random vanc, not a vanc trough.  RN asked for a vanc trough on next draw.  Per dialysis center, they are administering the patient's vancomycin during dialysis, but do not dose it.  She states that [facility] physicians are dosing the medication.  [facility] confirmed that [facility]. Please advise. Andree CossHowell, Elsye Mccollister M, RN

## 2014-05-25 ENCOUNTER — Telehealth: Payer: Self-pay | Admitting: *Deleted

## 2014-05-25 ENCOUNTER — Other Ambulatory Visit (HOSPITAL_COMMUNITY): Payer: Self-pay | Admitting: Orthopedic Surgery

## 2014-05-25 NOTE — Telephone Encounter (Signed)
[name] [name] said this level was fine for HD, they dose with higher trough going up to 25

## 2014-05-25 NOTE — Telephone Encounter (Signed)
Notified Dr. Daiva EvesVan Dam, 1615, 05/25/14.  Pt receiving IV Vanc after Dialysis - Vanc Trough 23.5, 05/23/14.  Report dated 05/25/14.  Phone number for [facility], [place], [phone], Ext [phone].  Fax number for written orders to Dialysis - [phone]

## 2014-05-29 MED ORDER — CEFAZOLIN SODIUM-DEXTROSE 2-3 GM-% IV SOLR
2.0000 g | INTRAVENOUS | Status: AC
  Administered 2014-05-30: 2 g via INTRAVENOUS
  Filled 2014-05-29: qty 50

## 2014-05-29 NOTE — Progress Notes (Signed)
Several unsuccessful attempts were made to contact [name]; lvm with pre-op instructions only. [name] made aware to not take any diabetic medications the morning of surgery. [name] made aware to stop taking Aspirin, otc vitamins and herbal medications. Do not take any NSAIDs ie: Ibuprofen, Advil, Naproxen or any medication containing Aspirin.

## 2014-05-30 ENCOUNTER — Inpatient Hospital Stay (HOSPITAL_COMMUNITY): Payer: Medicaid Other | Admitting: Certified Registered Nurse Anesthetist

## 2014-05-30 ENCOUNTER — Encounter (HOSPITAL_COMMUNITY)
Admission: RE | Disposition: A | Discharge status: Skilled Nursing Facility | Payer: Self-pay | Source: Ambulatory Visit | Attending: Orthopedic Surgery

## 2014-05-30 ENCOUNTER — Inpatient Hospital Stay (HOSPITAL_COMMUNITY)
Admission: RE | Admit: 2014-05-30 | Discharge: 2014-06-01 | DRG: 907 | Disposition: A | Discharge status: Skilled Nursing Facility | Payer: Medicaid Other | Source: Ambulatory Visit | Attending: Orthopedic Surgery | Admitting: Orthopedic Surgery

## 2014-05-30 ENCOUNTER — Encounter (HOSPITAL_COMMUNITY): Payer: Self-pay | Admitting: *Deleted

## 2014-05-30 DIAGNOSIS — IMO0002 Reserved for concepts with insufficient information to code with codable children: Secondary | ICD-10-CM

## 2014-05-30 DIAGNOSIS — E11621 Type 2 diabetes mellitus with foot ulcer: Secondary | ICD-10-CM | POA: Diagnosis present

## 2014-05-30 DIAGNOSIS — N186 End stage renal disease: Secondary | ICD-10-CM | POA: Diagnosis present

## 2014-05-30 DIAGNOSIS — Z7982 Long term (current) use of aspirin: Secondary | ICD-10-CM | POA: Diagnosis not present

## 2014-05-30 DIAGNOSIS — M4624 Osteomyelitis of vertebra, thoracic region: Secondary | ICD-10-CM | POA: Diagnosis present

## 2014-05-30 DIAGNOSIS — Y838 Other surgical procedures as the cause of abnormal reaction of the patient, or of later complication, without mention of misadventure at the time of the procedure: Secondary | ICD-10-CM | POA: Diagnosis present

## 2014-05-30 DIAGNOSIS — L97529 Non-pressure chronic ulcer of other part of left foot with unspecified severity: Secondary | ICD-10-CM | POA: Diagnosis present

## 2014-05-30 DIAGNOSIS — M868X7 Other osteomyelitis, ankle and foot: Secondary | ICD-10-CM | POA: Diagnosis present

## 2014-05-30 DIAGNOSIS — Z992 Dependence on renal dialysis: Secondary | ICD-10-CM | POA: Diagnosis not present

## 2014-05-30 DIAGNOSIS — F329 Major depressive disorder, single episode, unspecified: Secondary | ICD-10-CM | POA: Diagnosis present

## 2014-05-30 DIAGNOSIS — T8130XA Disruption of wound, unspecified, initial encounter: Principal | ICD-10-CM | POA: Diagnosis present

## 2014-05-30 DIAGNOSIS — Z794 Long term (current) use of insulin: Secondary | ICD-10-CM

## 2014-05-30 DIAGNOSIS — F419 Anxiety disorder, unspecified: Secondary | ICD-10-CM | POA: Diagnosis present

## 2014-05-30 DIAGNOSIS — Z9889 Other specified postprocedural states: Secondary | ICD-10-CM

## 2014-05-30 DIAGNOSIS — E1151 Type 2 diabetes mellitus with diabetic peripheral angiopathy without gangrene: Secondary | ICD-10-CM | POA: Diagnosis present

## 2014-05-30 DIAGNOSIS — Z89422 Acquired absence of other left toe(s): Secondary | ICD-10-CM

## 2014-05-30 DIAGNOSIS — Z95820 Peripheral vascular angioplasty status with implants and grafts: Secondary | ICD-10-CM | POA: Diagnosis not present

## 2014-05-30 DIAGNOSIS — N2581 Secondary hyperparathyroidism of renal origin: Secondary | ICD-10-CM | POA: Diagnosis present

## 2014-05-30 DIAGNOSIS — I120 Hypertensive chronic kidney disease with stage 5 chronic kidney disease or end stage renal disease: Secondary | ICD-10-CM | POA: Diagnosis present

## 2014-05-30 DIAGNOSIS — D649 Anemia, unspecified: Secondary | ICD-10-CM | POA: Diagnosis present

## 2014-05-30 DIAGNOSIS — E1142 Type 2 diabetes mellitus with diabetic polyneuropathy: Secondary | ICD-10-CM | POA: Diagnosis present

## 2014-05-30 DIAGNOSIS — K219 Gastro-esophageal reflux disease without esophagitis: Secondary | ICD-10-CM | POA: Diagnosis present

## 2014-05-30 DIAGNOSIS — Z79899 Other long term (current) drug therapy: Secondary | ICD-10-CM | POA: Diagnosis not present

## 2014-05-30 DIAGNOSIS — Z885 Allergy status to narcotic agent status: Secondary | ICD-10-CM | POA: Diagnosis not present

## 2014-05-30 HISTORY — DX: Anxiety disorder, unspecified: F41.9

## 2014-05-30 HISTORY — PX: AMPUTATION: SHX166

## 2014-05-30 HISTORY — PX: BELOW KNEE LEG AMPUTATION: SUR23

## 2014-05-30 LAB — GLUCOSE, CAPILLARY
GLUCOSE-CAPILLARY: 152 mg/dL — AB (ref 70–99)
GLUCOSE-CAPILLARY: 145 mg/dL — AB (ref 70–99)
Glucose-Capillary: 135 mg/dL — ABNORMAL HIGH (ref 70–99)
Glucose-Capillary: 148 mg/dL — ABNORMAL HIGH (ref 70–99)
Glucose-Capillary: 124 mg/dL — ABNORMAL HIGH (ref 70–99)

## 2014-05-30 LAB — CBC
HCT: 33.5 % — ABNORMAL LOW (ref 36.0–46.0)
Hemoglobin: 10 g/dL — ABNORMAL LOW (ref 12.0–15.0)
MCH: 26.7 pg (ref 26.0–34.0)
MCHC: 29.9 g/dL — AB (ref 30.0–36.0)
MCV: 89.6 fL (ref 78.0–100.0)
Platelets: 213 10*3/uL (ref 150–400)
RBC: 3.74 MIL/uL — AB (ref 3.87–5.11)
RDW: 18 % — ABNORMAL HIGH (ref 11.5–15.5)
WBC: 7.9 10*3/uL (ref 4.0–10.5)

## 2014-05-30 LAB — RENAL FUNCTION PANEL
ANION GAP: 13 (ref 5–15)
Albumin: 2.5 g/dL — ABNORMAL LOW (ref 3.5–5.2)
BUN: 26 mg/dL — ABNORMAL HIGH (ref 6–23)
CALCIUM: 9 mg/dL (ref 8.4–10.5)
CO2: 28 mmol/L (ref 19–32)
Chloride: 96 mmol/L (ref 96–112)
Creatinine, Ser: 5.62 mg/dL — ABNORMAL HIGH (ref 0.50–1.10)
GFR, EST AFRICAN AMERICAN: 9 mL/min — AB (ref >90)
GFR, EST NON AFRICAN AMERICAN: 8 mL/min — AB (ref >90)
Glucose, Bld: 147 mg/dL — ABNORMAL HIGH (ref 70–99)
PHOSPHORUS: 4.1 mg/dL (ref 2.3–4.6)
POTASSIUM: 3.9 mmol/L (ref 3.5–5.1)
SODIUM: 137 mmol/L (ref 135–145)

## 2014-05-30 LAB — POCT I-STAT 4, (NA,K, GLUC, HGB,HCT)
GLUCOSE: 148 mg/dL — AB (ref 70–99)
HCT: 39 % (ref 36.0–46.0)
HEMOGLOBIN: 13.3 g/dL (ref 12.0–15.0)
Potassium: 3.8 mmol/L (ref 3.5–5.1)
SODIUM: 137 mmol/L (ref 135–145)

## 2014-05-30 SURGERY — AMPUTATION BELOW KNEE
Anesthesia: General | Site: Leg Lower | Laterality: Left

## 2014-05-30 MED ORDER — FENTANYL CITRATE 0.05 MG/ML IJ SOLN
INTRAMUSCULAR | Status: AC
  Filled 2014-05-30: qty 5

## 2014-05-30 MED ORDER — HYDROMORPHONE HCL 1 MG/ML IJ SOLN
INTRAMUSCULAR | Status: AC
  Filled 2014-05-30: qty 1

## 2014-05-30 MED ORDER — METOCLOPRAMIDE HCL 5 MG/ML IJ SOLN: 5.0000 mg | Freq: Three times a day (TID) | INTRAMUSCULAR | Status: DC | PRN

## 2014-05-30 MED ORDER — MIDAZOLAM HCL 5 MG/5ML IJ SOLN
INTRAMUSCULAR | Status: DC | PRN
  Administered 2014-05-30: 1 mg via INTRAVENOUS
  Administered 2014-05-30: 2 mg via INTRAVENOUS
  Administered 2014-05-30: 1 mg via INTRAVENOUS

## 2014-05-30 MED ORDER — PROMETHAZINE HCL 25 MG PO TABS: 25.0000 mg | ORAL_TABLET | Freq: Four times a day (QID) | ORAL | Status: DC | PRN

## 2014-05-30 MED ORDER — GABAPENTIN 300 MG PO CAPS: 300.0000 mg | ORAL_CAPSULE | Freq: Three times a day (TID) | ORAL | Status: DC

## 2014-05-30 MED ORDER — OXYCODONE-ACETAMINOPHEN 5-325 MG PO TABS
1.0000 | ORAL_TABLET | ORAL | Status: DC | PRN
  Administered 2014-05-30 – 2014-06-01 (×6): 2 via ORAL
  Filled 2014-05-30 (×5): qty 2

## 2014-05-30 MED ORDER — DARBEPOETIN ALFA 40 MCG/0.4ML IJ SOSY
40.0000 ug | PREFILLED_SYRINGE | INTRAMUSCULAR | Status: DC
  Administered 2014-05-30: 40 ug via INTRAVENOUS

## 2014-05-30 MED ORDER — ATENOLOL 25 MG PO TABS
25.0000 mg | ORAL_TABLET | Freq: Every day | ORAL | Status: DC
  Administered 2014-05-30 – 2014-06-01 (×3): 25 mg via ORAL
  Filled 2014-05-30 (×3): qty 1

## 2014-05-30 MED ORDER — POLYETHYLENE GLYCOL 3350 17 G PO PACK
17.0000 g | PACK | Freq: Two times a day (BID) | ORAL | Status: DC
  Administered 2014-05-30 – 2014-06-01 (×5): 17 g via ORAL
  Filled 2014-05-30 (×6): qty 1

## 2014-05-30 MED ORDER — PROMETHAZINE HCL 25 MG/ML IJ SOLN: 6.2500 mg | INTRAMUSCULAR | Status: DC | PRN

## 2014-05-30 MED ORDER — METHOCARBAMOL 1000 MG/10ML IJ SOLN
500.0000 mg | Freq: Four times a day (QID) | INTRAVENOUS | Status: DC | PRN
  Filled 2014-05-30: qty 5

## 2014-05-30 MED ORDER — SODIUM CHLORIDE 0.9 % IV SOLN
125.0000 mg | INTRAVENOUS | Status: DC
  Administered 2014-05-30 – 2014-06-01 (×2): 125 mg via INTRAVENOUS
  Filled 2014-05-30 (×4): qty 10

## 2014-05-30 MED ORDER — FAMOTIDINE 20 MG PO TABS: 20.0000 mg | ORAL_TABLET | Freq: Every day | ORAL | Status: DC

## 2014-05-30 MED ORDER — LANTHANUM CARBONATE 500 MG PO CHEW: 1000.0000 mg | CHEWABLE_TABLET | ORAL | Status: DC

## 2014-05-30 MED ORDER — LIDOCAINE HCL (CARDIAC) 20 MG/ML IV SOLN
INTRAVENOUS | Status: AC
  Filled 2014-05-30: qty 5

## 2014-05-30 MED ORDER — ONDANSETRON HCL 4 MG PO TABS
4.0000 mg | ORAL_TABLET | Freq: Four times a day (QID) | ORAL | Status: DC | PRN
  Administered 2014-06-01: 4 mg via ORAL
  Filled 2014-05-30: qty 1

## 2014-05-30 MED ORDER — PROPOFOL 10 MG/ML IV BOLUS
INTRAVENOUS | Status: AC
  Filled 2014-05-30: qty 20

## 2014-05-30 MED ORDER — METHOCARBAMOL 500 MG PO TABS: 500.0000 mg | ORAL_TABLET | Freq: Four times a day (QID) | ORAL | Status: DC | PRN

## 2014-05-30 MED ORDER — SODIUM CHLORIDE 0.9 % IV SOLN: 125.0000 mg | INTRAVENOUS | Status: DC

## 2014-05-30 MED ORDER — INSULIN ASPART 100 UNIT/ML ~~LOC~~ SOLN
3.0000 [IU] | Freq: Three times a day (TID) | SUBCUTANEOUS | Status: DC
  Administered 2014-05-31 (×3): 3 [IU] via SUBCUTANEOUS

## 2014-05-30 MED ORDER — ASPIRIN 81 MG PO CHEW
81.0000 mg | CHEWABLE_TABLET | Freq: Every day | ORAL | Status: DC
  Administered 2014-05-30 – 2014-06-01 (×3): 81 mg via ORAL
  Filled 2014-05-30 (×3): qty 1

## 2014-05-30 MED ORDER — INSULIN DETEMIR 100 UNIT/ML ~~LOC~~ SOLN
22.0000 [IU] | Freq: Every day | SUBCUTANEOUS | Status: DC
  Administered 2014-05-31 – 2014-06-01 (×2): 22 [IU] via SUBCUTANEOUS
  Filled 2014-05-30 (×3): qty 0.22

## 2014-05-30 MED ORDER — MIDAZOLAM HCL 2 MG/2ML IJ SOLN
INTRAMUSCULAR | Status: AC
  Filled 2014-05-30: qty 2

## 2014-05-30 MED ORDER — ONDANSETRON HCL 4 MG/2ML IJ SOLN
INTRAMUSCULAR | Status: AC
  Filled 2014-05-30: qty 2

## 2014-05-30 MED ORDER — CLONAZEPAM 0.5 MG PO TABS
0.5000 mg | ORAL_TABLET | Freq: Every day | ORAL | Status: DC
  Administered 2014-05-30 – 2014-06-01 (×3): 0.5 mg via ORAL
  Filled 2014-05-30 (×2): qty 1

## 2014-05-30 MED ORDER — DOCUSATE SODIUM 100 MG PO CAPS
100.0000 mg | ORAL_CAPSULE | Freq: Two times a day (BID) | ORAL | Status: DC
  Administered 2014-05-31 – 2014-06-01 (×4): 100 mg via ORAL
  Filled 2014-05-30 (×5): qty 1

## 2014-05-30 MED ORDER — LINAGLIPTIN 5 MG PO TABS
5.0000 mg | ORAL_TABLET | Freq: Every day | ORAL | Status: DC
  Administered 2014-05-31: 5 mg via ORAL
  Filled 2014-05-30 (×3): qty 1

## 2014-05-30 MED ORDER — VANCOMYCIN HCL IN DEXTROSE 1-5 GM/200ML-% IV SOLN
1000.0000 mg | INTRAVENOUS | Status: DC
  Administered 2014-05-30 – 2014-06-01 (×2): 1000 mg via INTRAVENOUS
  Filled 2014-05-30 (×4): qty 200

## 2014-05-30 MED ORDER — POLYETHYLENE GLYCOL 3350 17 G PO PACK
17.0000 g | PACK | Freq: Every day | ORAL | Status: DC | PRN
  Filled 2014-05-30: qty 1

## 2014-05-30 MED ORDER — OXYCODONE HCL 5 MG/5ML PO SOLN: 5.0000 mg | Freq: Once | ORAL | Status: DC | PRN

## 2014-05-30 MED ORDER — DOXERCALCIFEROL 4 MCG/2ML IV SOLN: 4.0000 ug | INTRAVENOUS | Status: DC

## 2014-05-30 MED ORDER — INSULIN ASPART 100 UNIT/ML ~~LOC~~ SOLN
0.0000 [IU] | Freq: Three times a day (TID) | SUBCUTANEOUS | Status: DC
  Administered 2014-05-31: 1 [IU] via SUBCUTANEOUS
  Administered 2014-05-31: 3 [IU] via SUBCUTANEOUS
  Administered 2014-05-31 – 2014-06-01 (×2): 1 [IU] via SUBCUTANEOUS

## 2014-05-30 MED ORDER — DARBEPOETIN ALFA 40 MCG/0.4ML IJ SOSY
PREFILLED_SYRINGE | INTRAMUSCULAR | Status: AC
  Filled 2014-05-30: qty 0.4

## 2014-05-30 MED ORDER — HYDROMORPHONE HCL 1 MG/ML IJ SOLN
0.5000 mg | INTRAMUSCULAR | Status: DC | PRN
  Administered 2014-05-30 – 2014-06-01 (×6): 1 mg via INTRAVENOUS
  Filled 2014-05-30 (×5): qty 1

## 2014-05-30 MED ORDER — BOOST / RESOURCE BREEZE PO LIQD
1.0000 | Freq: Three times a day (TID) | ORAL | Status: DC
  Administered 2014-05-31 – 2014-06-01 (×3): 1 via ORAL

## 2014-05-30 MED ORDER — CLONAZEPAM 0.5 MG PO TABS
0.5000 mg | ORAL_TABLET | Freq: Two times a day (BID) | ORAL | Status: DC | PRN
  Administered 2014-05-30 – 2014-06-01 (×3): 0.5 mg via ORAL
  Filled 2014-05-30 (×4): qty 1

## 2014-05-30 MED ORDER — PANTOPRAZOLE SODIUM 40 MG PO TBEC
40.0000 mg | DELAYED_RELEASE_TABLET | Freq: Every day | ORAL | Status: DC
  Administered 2014-05-30 – 2014-06-01 (×3): 40 mg via ORAL
  Filled 2014-05-30 (×3): qty 1

## 2014-05-30 MED ORDER — RENA-VITE PO TABS
1.0000 | ORAL_TABLET | Freq: Every day | ORAL | Status: DC
  Administered 2014-05-30 – 2014-06-01 (×3): 1 via ORAL
  Filled 2014-05-30 (×3): qty 1

## 2014-05-30 MED ORDER — GABAPENTIN 300 MG PO CAPS
300.0000 mg | ORAL_CAPSULE | Freq: Every day | ORAL | Status: DC
  Administered 2014-05-30 – 2014-06-01 (×3): 300 mg via ORAL
  Filled 2014-05-30 (×3): qty 1

## 2014-05-30 MED ORDER — SODIUM CHLORIDE 0.9 % IV SOLN: INTRAVENOUS | Status: DC

## 2014-05-30 MED ORDER — FENTANYL CITRATE 0.05 MG/ML IJ SOLN: 25.0000 ug | INTRAMUSCULAR | Status: DC | PRN

## 2014-05-30 MED ORDER — METHOCARBAMOL 1000 MG/10ML IJ SOLN
500.0000 mg | INTRAVENOUS | Status: AC
  Administered 2014-05-30: 500 mg via INTRAVENOUS
  Filled 2014-05-30: qty 5

## 2014-05-30 MED ORDER — LIDOCAINE HCL (CARDIAC) 20 MG/ML IV SOLN
INTRAVENOUS | Status: DC | PRN
  Administered 2014-05-30: 60 mg via INTRAVENOUS

## 2014-05-30 MED ORDER — METOCLOPRAMIDE HCL 5 MG PO TABS: 5.0000 mg | ORAL_TABLET | Freq: Three times a day (TID) | ORAL | Status: DC | PRN

## 2014-05-30 MED ORDER — ARTIFICIAL TEARS OP OINT
TOPICAL_OINTMENT | OPHTHALMIC | Status: AC
  Filled 2014-05-30: qty 3.5

## 2014-05-30 MED ORDER — MAGNESIUM CITRATE PO SOLN: 1.0000 | Freq: Once | ORAL | Status: DC | PRN

## 2014-05-30 MED ORDER — SUCCINYLCHOLINE CHLORIDE 20 MG/ML IJ SOLN
INTRAMUSCULAR | Status: AC
  Filled 2014-05-30: qty 1

## 2014-05-30 MED ORDER — BISACODYL 5 MG PO TBEC
5.0000 mg | DELAYED_RELEASE_TABLET | Freq: Every day | ORAL | Status: DC | PRN
Start: 2014-05-30 — End: 2014-06-02

## 2014-05-30 MED ORDER — SEVELAMER CARBONATE 800 MG PO TABS
2400.0000 mg | ORAL_TABLET | Freq: Three times a day (TID) | ORAL | Status: DC
Start: 2014-05-30 — End: 2014-06-02
  Administered 2014-05-31 (×3): 2400 mg via ORAL
  Filled 2014-05-30 (×7): qty 3

## 2014-05-30 MED ORDER — OXYCODONE HCL 5 MG PO TABS: 5.0000 mg | ORAL_TABLET | Freq: Once | ORAL | Status: DC | PRN

## 2014-05-30 MED ORDER — SODIUM CHLORIDE 0.9 % IV SOLN
INTRAVENOUS | Status: DC | PRN
  Administered 2014-05-30: 10:00:00 via INTRAVENOUS

## 2014-05-30 MED ORDER — MEPERIDINE HCL 25 MG/ML IJ SOLN: 6.2500 mg | INTRAMUSCULAR | Status: DC | PRN

## 2014-05-30 MED ORDER — HYDROMORPHONE HCL 1 MG/ML IJ SOLN
0.2500 mg | INTRAMUSCULAR | Status: DC | PRN
  Administered 2014-05-30: 0.25 mg via INTRAVENOUS
  Administered 2014-05-30 (×2): 0.5 mg via INTRAVENOUS
  Administered 2014-05-30: 0.25 mg via INTRAVENOUS

## 2014-05-30 MED ORDER — ONDANSETRON HCL 4 MG/2ML IJ SOLN
INTRAMUSCULAR | Status: DC | PRN
  Administered 2014-05-30: 4 mg via INTRAVENOUS

## 2014-05-30 MED ORDER — OXYCODONE-ACETAMINOPHEN 5-325 MG PO TABS
ORAL_TABLET | ORAL | Status: AC
  Filled 2014-05-30: qty 2

## 2014-05-30 MED ORDER — ONDANSETRON HCL 4 MG/2ML IJ SOLN: 4.0000 mg | Freq: Four times a day (QID) | INTRAMUSCULAR | Status: DC | PRN

## 2014-05-30 MED ORDER — PROPOFOL 10 MG/ML IV BOLUS
INTRAVENOUS | Status: DC | PRN
  Administered 2014-05-30: 150 mg via INTRAVENOUS

## 2014-05-30 MED ORDER — ROCURONIUM BROMIDE 50 MG/5ML IV SOLN
INTRAVENOUS | Status: AC
  Filled 2014-05-30: qty 1

## 2014-05-30 MED ORDER — FENTANYL CITRATE 0.05 MG/ML IJ SOLN
INTRAMUSCULAR | Status: DC | PRN
  Administered 2014-05-30: 25 ug via INTRAVENOUS
  Administered 2014-05-30 (×2): 50 ug via INTRAVENOUS
  Administered 2014-05-30: 25 ug via INTRAVENOUS
  Administered 2014-05-30 (×2): 50 ug via INTRAVENOUS

## 2014-05-30 SURGICAL SUPPLY — 36 items
BLADE SAW RECIP 87.9 MT (BLADE) ×2 IMPLANT
BLADE SURG 21 STRL SS (BLADE) ×2 IMPLANT
BNDG COHESIVE 6X5 TAN STRL LF (GAUZE/BANDAGES/DRESSINGS) ×3 IMPLANT
BNDG GAUZE ELAST 4 BULKY (GAUZE/BANDAGES/DRESSINGS) ×3 IMPLANT
COVER SURGICAL LIGHT HANDLE (MISCELLANEOUS) ×2 IMPLANT
CUFF TOURNIQUET SINGLE 34IN LL (TOURNIQUET CUFF) ×1 IMPLANT
CUFF TOURNIQUET SINGLE 44IN (TOURNIQUET CUFF) IMPLANT
DRAPE EXTREMITY T 121X128X90 (DRAPE) ×2 IMPLANT
DRAPE PROXIMA HALF (DRAPES) ×4 IMPLANT
DRAPE U-SHAPE 47X51 STRL (DRAPES) ×2 IMPLANT
DRSG ADAPTIC 3X8 NADH LF (GAUZE/BANDAGES/DRESSINGS) ×2 IMPLANT
DRSG PAD ABDOMINAL 8X10 ST (GAUZE/BANDAGES/DRESSINGS) ×2 IMPLANT
DURAPREP 26ML APPLICATOR (WOUND CARE) ×2 IMPLANT
ELECT REM PT RETURN 9FT ADLT (ELECTROSURGICAL) ×2
ELECTRODE REM PT RTRN 9FT ADLT (ELECTROSURGICAL) ×1 IMPLANT
GAUZE SPONGE 4X4 12PLY STRL (GAUZE/BANDAGES/DRESSINGS) ×2 IMPLANT
GLOVE BIOGEL PI IND STRL 9 (GLOVE) ×1 IMPLANT
GLOVE BIOGEL PI INDICATOR 9 (GLOVE) ×1
GLOVE SURG ORTHO 9.0 STRL STRW (GLOVE) ×2 IMPLANT
GOWN STRL REUS W/ TWL XL LVL3 (GOWN DISPOSABLE) ×2 IMPLANT
GOWN STRL REUS W/TWL XL LVL3 (GOWN DISPOSABLE) ×4
KIT BASIN OR (CUSTOM PROCEDURE TRAY) ×2 IMPLANT
KIT ROOM TURNOVER OR (KITS) ×2 IMPLANT
MANIFOLD NEPTUNE II (INSTRUMENTS) ×2 IMPLANT
NS IRRIG 1000ML POUR BTL (IV SOLUTION) ×2 IMPLANT
PACK GENERAL/GYN (CUSTOM PROCEDURE TRAY) ×2 IMPLANT
PAD ARMBOARD 7.5X6 YLW CONV (MISCELLANEOUS) ×4 IMPLANT
SPONGE LAP 18X18 X RAY DECT (DISPOSABLE) IMPLANT
STAPLER VISISTAT 35W (STAPLE) ×1 IMPLANT
STOCKINETTE IMPERVIOUS LG (DRAPES) ×2 IMPLANT
SUT SILK 2 0 (SUTURE) ×2
SUT SILK 2-0 18XBRD TIE 12 (SUTURE) ×1 IMPLANT
SUT VIC AB 1 CTX 27 (SUTURE) IMPLANT
TOWEL OR 17X24 6PK STRL BLUE (TOWEL DISPOSABLE) ×2 IMPLANT
TOWEL OR 17X26 10 PK STRL BLUE (TOWEL DISPOSABLE) ×2 IMPLANT
WATER STERILE IRR 1000ML POUR (IV SOLUTION) ×2 IMPLANT

## 2014-05-30 NOTE — Consult Note (Addendum)
Requesting Physician:  Dr. Lajoyce Cornersuda Reason for Consult:  Pt with ESRD - provision of dialysis services, management of anemia and secondary hyperparathyroidism and renal related issues HPI: The patient is a 54 y.o. year-old WF with ESRD d/t DM, HTN, anemia, secondary hyperparathyroidism. 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  She had a left 4th and 5th toe amputation done 04/2014 due to gangrenous changes toes, and despite vascular stenting procedure to improve blood supply, had progressive who had Today underwent a left BKA by Dr. Lajoyce Cornersuda after progressive dehiscence/ulceration/osteomyelitis left foot. We are asked to provide HD.  Past Medical History  Diagnosis Date  . Hypertension   . Anemia   . Diabetes mellitus     Type 2  . Chronic kidney disease     T,Th,Sa dialysis  . GERD (gastroesophageal reflux disease)     protonix for "burping"  . Complication of anesthesia     had trouble being put to sleep and then difficulty waking up. States she doesn't have any issues with Propofol    Past Surgical History:  Past Surgical History  Procedure Laterality Date  . Insertion of dialysis catheter N/A 07/16/2013    Procedure: INSERTION OF DIALYSIS CATHETER;  Surgeon: Larina Earthlyodd F Early, MD;  [facility];  Service: Vascular;  Laterality: N/A;  . Av fistula placement Right 07/18/2013    Procedure: RIGHT ARTERIOVENOUS (AV) FISTULA CREATION;  Surgeon: Sherren Kernsharles E Fields, MD;  [facility];  Service: Vascular;  Laterality: Right;  . Insertion of dialysis catheter Left 12/22/2013    Procedure: INSERTION OF DIALYSIS CATHETER;  Surgeon: Larina Earthlyodd F Early, MD;  [facility];  Service: Vascular;  Laterality: Left;  . Back surgery  17 years    lumbar surgery   . Eye surgery Bilateral     cataract surgery with lens implants  . Hysteroscopy w/ endometrial ablation    . Fistula superficialization Right 02/16/2014    Procedure:  RIGHT Fistula BRACHIOCEPHALIC Superficialization;  Surgeon: Chuck Hinthristopher S Dickson, MD;  [facility];  Service: Vascular;  Laterality: Right;  . Fistulogram N/A 01/15/2014    Procedure: FISTULOGRAM;  Surgeon: Chuck Hinthristopher S Dickson, MD;  [facility];  Service: Cardiovascular;  Laterality: N/A;  . Lower extremity angiogram N/A 04/26/2014    Procedure: LOWER EXTREMITY ANGIOGRAM;  Surgeon: Fransisco HertzBrian L Chen, MD;  [facility];  Service: Cardiovascular;  Laterality: N/A;  . Amputation Left 05/04/2014    Procedure: AMPUTATION 4th and 5th TOES;  Surgeon: Nadara MustardMarcus Duda V, MD;  Location: [facility] OR;  Service: Orthopedics;  Laterality: Left;    Family History  Problem Relation Age of Onset  . Fibromyalgia Mother   . Ovarian cancer Mother   . Rectal cancer Mother   . Parkinson's disease Father    Social History:  reports that she has never smoked. She has never used smokeless tobacco. She reports that she does not drink alcohol or use illicit drugs.  Allergies:  Allergies  Allergen Reactions  . Codeine Other (See Comments)    hallucinations   Inpatient medications: This med rec is inaccurate . aspirin  81 mg Oral Daily  . atenolol  25 mg Oral Daily  . clonazePAM  0.5 mg Oral QHS  . [START ON 06/06/2014]  darbepoetin (ARANESP) injection - DIALYSIS  40 mcg Intravenous Q Wed-HD  . docusate sodium  100 mg Oral BID  . [START ON 05/31/2014] doxercalciferol  4 mcg Intravenous Q T,Th,Sa-HD  . famotidine  20 mg Oral QHS  . feeding supplement (RESOURCE BREEZE)  1 Container Oral TID BM  . [START ON 05/31/2014] ferric gluconate (FERRLECIT/NULECIT) IV  125 mg Intravenous Q T,Th,Sa-HD  . [START ON 06/01/2014] ferric gluconate (FERRLECIT/NULECIT) IV  125 mg Intravenous Q M,W,F-HD  . gabapentin  300 mg Oral TID  . HYDROmorphone      .  HYDROmorphone      . insulin aspart  0-9 Units Subcutaneous TID WC  . insulin aspart  3 Units Subcutaneous TID WC  . insulin detemir  22 Units Subcutaneous QHS  . linagliptin  5 mg Oral Daily  . midazolam      . multivitamin  1 tablet Oral QHS  . pantoprazole  40 mg Oral Daily  . polyethylene glycol  17 g Oral BID  . sevelamer carbonate  2,400 mg Oral TID WC  . [START ON 06/01/2014] vancomycin  1,000 mg Intravenous Q M,W,F-HD   PRE ADMISSION MEDICINES - NEED TO GET AN ACCURATE  LIST FROM HER SNF IN [place]  Review of Systems Limited ROS d/t groggy Complains primarily of back pain, some surgical stump pain No CP, SOB, N/V  Physical Exam:  BP 155/77 mmHg  Pulse 99  Temp(Src) 97.5 F (36.4 C)  Resp 14  Ht  (1.549 m)  Wt 80.287 kg (177 lb)  BMI 33.46 kg/m2  SpO2 100%  Gen: ill appearing pale WF complains of back and leg pain Skin: no rash, cyanosis Neck: no JVD, no bruits or LAN Chest: Grossly clear anteriorly Heart: Regular S1S2 No S3 2/6 murmur USB no diastolic murmur or rubgallop Abdomen: soft, non-distended Ext: No RLE edema. Left BKA tightly wrapped Neuro: Groggy but oriented X 3 R AVF + bruit  Labs: Basic Metabolic Panel:  Recent Labs Lab 05/30/14 0922  NA 137  K 3.8  GLUCOSE 148*   Recent Labs Lab 05/30/14 0922  HGB 13.3  HCT 39.0    Recent Labs Lab 05/30/14 0759 05/30/14 1005 05/30/14 1110  GLUCAP 135* 152* 148*   Dialysis Prescription: Currently at [facility] (originally [place]) - due to SNF placement MWF 4 hours EDW 77 2K2Ca bath BFR 400 AVF R  Hep 2400  Aranesp 40 QWednesday Hb 11.3 2/10 Vancomycin 1 gm QHD through 06/17/14 Calcitriol on hold due to ca 11.1 on  2/10 Last PTH 90 1/22 (and binder changed from phoslo to renvela)  Impression/Plan 1. S/p L BKA for dehiscense/osteo left foot - per Dr. Lajoyce Corners 2. ESRD - Currently MWF at [facility]. Due fo HD today. 3. T10-11 osteomyelitis - has been on vanco 1 gram IV  QHD with a stop date projected 06/17/14. Continue this admission 4. Anemia - continue aranesp 40 QWed HD. Check regular CBC (istat not reliable for dosing of Aranesp. Hb at outpt unit was 11.3 on 2/10) 5. Secondary hyperparathyroidism - calcitriol  on hold due to PTH of 90 and calcium of 11 on 2/10. Now on Renvela 3 TID ac as of 2/12 as phos binder 6. HTN - has not been on BP meds of late, based on her med list from the dialysis unit 7. DM - get list of outpt meds 8. PAD - prior L SFA stenting by Dr. Chanetta Marshall,  MD [facility] [phone] pager 05/30/2014, 12:38 PM

## 2014-05-30 NOTE — Transfer of Care (Signed)
Immediate Anesthesia Transfer of Care Note  Patient: [name]  Procedure(s) Performed: Procedure(s): Left Below Knee Amputation  (Left)  Patient Location: PACU  Anesthesia Type:General  Level of Consciousness: awake and alert   Airway & Oxygen Therapy: Patient Spontanous Breathing and Patient connected to nasal cannula oxygen  Post-op Assessment: Report given to RN and Post -op Vital signs reviewed and stable  Post vital signs: Reviewed and stable  Last Vitals:  Filed Vitals:   05/30/14 0752  BP: 169/84  Pulse: 101  Temp: 36.7 C  Resp: 20    Complications: No apparent anesthesia complications

## 2014-05-30 NOTE — H&P (Signed)
[name] is an 54 y.o. female.   Chief Complaint: Osteomyelitis ulceration left lower extremity HPI: [name] is a 54 year old woman with diabetes peripheral vascular disease status post foot salvage intervention who presents at this time with progressive dehiscence ulceration osteomyelitis left foot  Past Medical History  Diagnosis Date  . Hypertension   . Anemia   . Diabetes mellitus     Type 2  . Chronic kidney disease     T,Th,Sa dialysis  . GERD (gastroesophageal reflux disease)     protonix for "burping"  . Complication of anesthesia     had trouble being put to sleep and then difficulty waking up. States [name] doesn't have any issues with Propofol    Past Surgical History  Procedure Laterality Date  . Insertion of dialysis catheter N/A 07/16/2013    Procedure: INSERTION OF DIALYSIS CATHETER;  Surgeon: Larina Earthlyodd F Early, MD;  [facility];  Service: Vascular;  Laterality: N/A;  . Av fistula placement Right 07/18/2013    Procedure: RIGHT ARTERIOVENOUS (AV) FISTULA CREATION;  Surgeon: Sherren Kernsharles E Fields, MD;  [facility];  Service: Vascular;  Laterality: Right;  . Insertion of dialysis catheter Left 12/22/2013    Procedure: INSERTION OF DIALYSIS CATHETER;  Surgeon: Larina Earthlyodd F Early, MD;  [facility];  Service: Vascular;  Laterality: Left;  . Back surgery  17 years    lumbar surgery  . Eye surgery Bilateral     cataract surgery with lens implants  . Hysteroscopy w/ endometrial ablation    . Fistula superficialization Right 02/16/2014    Procedure:  RIGHT Fistula BRACHIOCEPHALIC Superficialization;  Surgeon: Chuck Hinthristopher S Dickson, MD;  [facility];  Service: Vascular;  Laterality: Right;  . Fistulogram N/A 01/15/2014    Procedure: FISTULOGRAM;  Surgeon: Chuck Hinthristopher S Dickson, MD;  [facility];  Service: Cardiovascular;  Laterality: N/A;  . Lower extremity angiogram N/A 04/26/2014    Procedure: LOWER EXTREMITY ANGIOGRAM;  Surgeon: Fransisco HertzBrian L Chen, MD;  [facility];  Service: Cardiovascular;  Laterality: N/A;  . Amputation Left 05/04/2014    Procedure: AMPUTATION 4th and 5th TOES;  Surgeon: Nadara MustardMarcus Galileah Piggee V, MD;  Location: [facility] OR;  Service: Orthopedics;  Laterality: Left;    Family History  Problem Relation Age of Onset  . Fibromyalgia Mother   . Ovarian cancer Mother   . Rectal cancer Mother   . Parkinson's disease Father    Social History:  reports that [name] has never smoked. [name] has never used smokeless tobacco. [name] reports that [name] does not drink alcohol or use illicit drugs.  Allergies:  Allergies  Allergen Reactions  . Codeine Other (See Comments)    hallucinations    No prescriptions prior to admission    No results found for this or any previous visit (from the past 48 hour(s)). No results found.  ROS  There were no vitals taken for this visit. Physical Exam  On examination [name] has dehiscence ulceration osteomyelitis left foot Assessment/Plan Assessment: Diabetic insensate neuropathy peripheral vascular disease with dehiscence ulceration left foot.  Plan: We'll plan for left transtibial amputation. Risks and benefits were discussed including risk of the wound healing. [name] states [name] understands and wishes to proceed at this time.  Alisea Matte V 05/30/2014, 6:52 AM

## 2014-05-30 NOTE — Clinical Social Work Psychosocial (Signed)
Clinical Social Work Department BRIEF PSYCHOSOCIAL ASSESSMENT 05/30/2014  Patient:  [name],[name]     Account Number:  [id number]     Admit date:  05/30/2014  Clinical Social Worker:  Delmer IslamRAWFORD,Zamyah Wiesman, LCSW  Date/Time:  05/30/2014 04:42 AM  Referred by:  Physician  Date Referred:  05/30/2014 Referred for  SNF Placement   Other Referral:   Interview type:  Family Other interview type:    PSYCHOSOCIAL DATA Living Status:  FACILITY [facility] Level of care:  Skilled Nursing Facility Primary support name:  [name] Primary support relationship to patient:  S[place]LING Degree of support available:   Patient's sister and mother, [name] are concerned , involved and supportive of patient.    CURRENT CONCERNS Current Concerns  Post-Acute Placement   Other Concerns:    SOCIAL WORK ASSESSMENT / PLAN CSW talked with patient's mother, [name] and sister [name] regarding discharge plans for patient. They are both visibly concerned about patient, her health and her current SNF placement.  [facility] and Medicaid is her payor. CSW talked with family about Medicaid and skilled nursing facilities and short-term rehab and answered their questions. CSW advised that patient is not happy at [facility] and they would like to see if their are any facilities [place] that [place]. Family advised that a new facility search will be initiated.   Assessment/plan status:  Psychosocial Support/Ongoing Assessment of Needs Other assessment/ plan:   Information/referral to community resources:   Family provided [place]    PATIENT'S/FAMILY'S RESPONSE TO PLAN OF CARE: Family very glad they were able to speak with CSW and are hopeful that another facility can be located. They are aware that CSW will also speak with  patient about her SNF placement.       Genelle BalVanessa Sadako Cegielski, MSW, LCSW Licensed Clinical Social Worker Clinical Social Work Department [facility] [phone]

## 2014-05-30 NOTE — Progress Notes (Signed)
New Admission Note:   Arrival Method: via stretcher from PACU Mental Orientation: Alert and Oriented  Telemetry: n/a Assessment: Completed Skin: intact. Has surgical dressing on new left BKA IV: NSL Pain: 7out of 10 Tubes: n/a Safety Measures: Safety Fall Prevention Plan has been given, discussed and signed Admission: Completed [place] Orientation: Patient has been orientated to the room, unit and staff.  Family:  At bedside  Orders have been reviewed and implemented. Will continue to monitor the patient. Call light has been placed within reach and bed alarm has been activated.   De Nursey Jessicalynn Deshong BSN, Publishing copyN  Phone number: [phone]

## 2014-05-30 NOTE — Op Note (Signed)
   Date of Surgery: 05/30/2014  INDICATIONS: [name] is a 54 y.o.-year-old female who has failed foot salvage intervention for her left foot and presents at this time for transtibial amputation.  PREOPERATIVE DIAGNOSIS: Ulceration osteomyelitis dehiscence left foot  POSTOPERATIVE DIAGNOSIS: Same.  PROCEDURE: Transtibial amputation left  SURGEON: Lajoyce Cornersuda, M.D.  ANESTHESIA:  general  IV FLUIDS AND URINE: See anesthesia.  ESTIMATED BLOOD LOSS: Minimal mL.  COMPLICATIONS: None.  DESCRIPTION OF PROCEDURE: The patient was brought to the operating room and underwent a general anesthetic. After adequate levels of anesthesia were obtained patient's lower extremity was prepped using DuraPrep draped into a sterile field. A timeout was called.  A transverse incision was made 11 cm distal to the tibial tubercle. This curved proximally and a large posterior flap was created. The tibia was transected 1 cm proximal to the skin incision. The fibula was transected just proximal to the tibial incision. The tibia was beveled anteriorly. A large posterior flap was created. The sciatic nerve was pulled cut and allowed to retract. The vascular bundles were suture ligated with 2-0 silk. The deep and superficial fascial layers were closed using #1 Vicryl. The skin was closed using staples and 2-0 nylon. The wound was covered with Adaptic orthopedic sponges AB dressing Kerlix and Coban. Patient was extubated taken to the PACU in stable condition.  Aldean BakerMarcus Duda, MD [facility] 11:12 AM

## 2014-05-30 NOTE — Anesthesia Preprocedure Evaluation (Addendum)
Anesthesia Evaluation  Patient identified by MRN, date of birth, ID band Patient awake    Reviewed: Allergy & Precautions, NPO status , Patient's Chart, lab work & pertinent test results, reviewed documented beta blocker date and time   Airway Mallampati: II  TM Distance: >3 FB Neck ROM: Full    Dental no notable dental hx. (+) Dental Advisory Given   Pulmonary shortness of breath,  breath sounds clear to auscultation  Pulmonary exam normal       Cardiovascular hypertension, Pt. on medications Rhythm:Regular Rate:Normal  ECHO 04/2014 EF 60%, EKG poss old inf MI no change   Neuro/Psych negative neurological ROS     GI/Hepatic GERD-  Medicated,  Endo/Other  diabetes, Poorly Controlled, Type 2, Insulin Dependent  Renal/GU DialysisRenal disease     Musculoskeletal  (+) Arthritis -,   Abdominal   Peds  Hematology negative hematology ROS (+)   Anesthesia Other Findings   Reproductive/Obstetrics                           Anesthesia Physical  Anesthesia Plan  ASA: III  Anesthesia Plan: General   Post-op Pain Management:    Induction:   Airway Management Planned: LMA and Oral ETT  Additional Equipment: None  Intra-op Plan:   Post-operative Plan: Extubation in OR  Informed Consent: I have reviewed the patients History and Physical, chart, labs and discussed the procedure including the risks, benefits and alternatives for the proposed anesthesia with the patient or authorized representative who has indicated his/her understanding and acceptance.   Dental advisory given  Plan Discussed with: CRNA  Anesthesia Plan Comments:        Anesthesia Quick Evaluation

## 2014-05-30 NOTE — Anesthesia Procedure Notes (Signed)
Procedure Name: LMA Insertion Date/Time: 05/30/2014 10:38 AM Performed by: Margaree MackintoshYACOUB, Mailey Landstrom B Pre-anesthesia Checklist: Patient identified, Emergency Drugs available, Suction available, Patient being monitored and Timeout performed Patient Re-evaluated:Patient Re-evaluated prior to inductionOxygen Delivery Method: Circle system utilized Preoxygenation: Pre-oxygenation with 100% oxygen Intubation Type: IV induction LMA: LMA inserted LMA Size: 4.0 Number of attempts: 1 Placement Confirmation: positive ETCO2 and breath sounds checked- equal and bilateral Tube secured with: Tape Dental Injury: Teeth and Oropharynx as per pre-operative assessment

## 2014-05-30 NOTE — Procedures (Signed)
Patient was seen on dialysis and the procedure was supervised.  BFR 400  Via AVF BP is  115/71.   Patient appears uncomfortable , trying to support/reassure as best we can  [name] A 05/30/2014

## 2014-05-30 NOTE — Anesthesia Postprocedure Evaluation (Signed)
  Anesthesia Post-op Note  Patient: [name]  Procedure(s) Performed: Procedure(s): Left Below Knee Amputation  (Left)  Patient Location: PACU  Anesthesia Type:General  Level of Consciousness: awake and alert   Airway and Oxygen Therapy: Patient Spontanous Breathing  Post-op Pain: mild  Post-op Assessment: Post-op Vital signs reviewed  Post-op Vital Signs: Reviewed  Last Vitals:  Filed Vitals:   05/30/14 1300  BP: 162/78  Pulse: 103  Temp: 36.3 C  Resp: 21    Complications: No apparent anesthesia complications

## 2014-05-31 ENCOUNTER — Encounter (HOSPITAL_COMMUNITY): Payer: Self-pay | Admitting: Orthopedic Surgery

## 2014-05-31 LAB — GLUCOSE, CAPILLARY
GLUCOSE-CAPILLARY: 128 mg/dL — AB (ref 70–99)
GLUCOSE-CAPILLARY: 137 mg/dL — AB (ref 70–99)
GLUCOSE-CAPILLARY: 227 mg/dL — AB (ref 70–99)
Glucose-Capillary: 165 mg/dL — ABNORMAL HIGH (ref 70–99)

## 2014-05-31 NOTE — Clinical Social Work Placement (Addendum)
Clinical Social Work Department CLINICAL SOCIAL WORK PLACEMENT NOTE 05/31/2014  Patient:  [name],[name]  Account Number:  [id number] Admit date:  05/30/2014  Clinical Social Worker:  Genelle BalVANESSA Bliss Tsang, LCSW  Date/time:  05/31/2014 12:34 PM  Clinical Social Work is seeking post-discharge placement for this patient at the following level of care:   SKILLED NURSING   (*CSW will update this form in Epic as items are completed)   05/30/2014  Patient/family provided with [facility] Department of Clinical Social Work's list of facilities offering this level of care within the geographic area requested by the patient (or if unable, by the patient's family).  05/30/2014  Patient/family informed of their freedom to choose among providers that offer the needed level of care, that participate in Medicare, Medicaid or managed care program needed by the patient, have an available bed and are willing to accept the patient.    Patient/family informed of [facility]' ownership interest in [facility], as well as of the fact that they are under no obligation to receive care at this facility.  PASARR submitted to EDS in 2016  PASARR number received in 2016  FL2 transmitted to all facilities in geographic area requested by pt/family on  05/31/2014 FL2 transmitted to all facilities within larger geographic area on   Patient informed that his/her managed care company has contracts with or will negotiate with  certain facilities, including the following:     Patient/family informed of bed offers received:   Patient chooses bed at  Physician recommends and patient chooses bed at    Patient to be transferred to  on   Patient to be transferred to facility by  Patient and family notified of transfer on  Name of family member notified:    The following physician request were entered in Epic:   Additional Comments: 05/30/14: Family contacts provided this date as (1) sister-Debbie  Mccarron: [phone] or (2) mother Delena ServeShelby McClean: [phone]. 05/31/14: CSW visited with patient at the bedside. [name] was very tearful as she was sitting in the chair and was ready to get back in bed. Patient felt 2 1/2 hours was enough and was becoming uncomfortable in the chair. Family at the bedside encouraging patient to hang in there and that her pain meds would be kicking in soon.      Genelle BalVanessa Evelyn Aguinaldo, MSW, LCSW Licensed Clinical Social Worker Clinical Social Work Department [facility] [phone]

## 2014-05-31 NOTE — Progress Notes (Signed)
Patient ID: [name], female   DOB: 09/12/1960, 54 y.o.   MRN: [id number] Postoperative day 1 left transtibial amputation. Patient states she receives dialysis last night. Dressing is clean and dry. Plan for discharge to skilled nursing. Possible discharge Friday.

## 2014-05-31 NOTE — Clinical Documentation Improvement (Signed)
Please specify diagnosis related to below supporting information if appropriate.   Possible Clinical Conditions?    Expected Acute Blood Loss Anemia  Acute Blood Loss Anemia  Acute on chronic blood loss anemia  Chronic blood loss anemia  Precipitous drop in Hematocrit  Other Condition________________  Cannot Clinically Determine    Supporting Information:  Patient s/p  Left Transtibial Amputation on 05/30/14.   Consult Note 05/30/2014   Reason for Consult:  Pt with ESRD - provision of dialysis services, management of anemia and secondary hyperparathyroidism and renal related issues  HPI:  The patient is a 54 y.o. year-old WF with ESRD d/t DM, HTN, anemia, secondary hyperparathyroidism.  Anemia  Labs:  Anemia - continue aranesp 40 QWed HD. Check regular CBC (istat not reliable for dosing of Aranesp.  H&P 05/30/2014   HPI:  Anemia    Patient's labs during this admission  Component     Latest Ref Rng 05/30/2014         9:22 AM  Hemoglobin     12.0 - 15.0 g/dL 16.113.3  HCT     09.636.0 - 04.546.0 % 39.0   Component     Latest Ref Rng 05/30/2014         3:30 PM  Hemoglobin     12.0 - 15.0 g/dL 40.910.0 (L)  HCT     81.136.0 - 46.0 % 33.5 (L)    Thank You, Shelda Palarlene H Teira Arcilla ,RN Clinical Documentation Specialist:  [phone]  [facility]- Health Information Management

## 2014-05-31 NOTE — Plan of Care (Signed)
Problem: Phase I Progression Outcomes Goal: OOB as tolerated unless otherwise ordered Outcome: Completed/Met Date Met:  05/31/14 PT able to get pt up in chair

## 2014-05-31 NOTE — Evaluation (Signed)
Physical Therapy Evaluation Patient Details Name: [name] MRN: [id number] DOB: 1961/02/29 Today's Date: 05/31/2014   History of Present Illness  Pt is a 54 year old woman with diabetes PVD status post foot salvage intervention who presents at this time with progressive dehiscence ulceration osteomyelitis left foot. Pt is now s/p L BKA.  Clinical Impression  Pt admitted with above diagnosis. Pt currently with functional limitations due to the deficits listed below (see PT Problem List). At the time of PT eval pt was very anxious and tearful. Pt was able to tolerate transition to recliner. Pt will benefit from skilled PT to increase their independence and safety with mobility to allow discharge to the venue listed below.  Pt from a SNF and per pt's aunt the plan is to return there at d/c.      Follow Up Recommendations SNF;Supervision/Assistance - 24 hour    Equipment Recommendations  None recommended by PT    Recommendations for Other Services       Precautions / Restrictions Precautions Precautions: Fall Restrictions Weight Bearing Restrictions: Yes LLE Weight Bearing: Non weight bearing      Mobility  Bed Mobility Overal bed mobility: Needs Assistance;+2 for physical assistance Bed Mobility: Supine to Sit     Supine to sit: Total assist;+2 for physical assistance;HOB elevated     General bed mobility comments: Assist required for all aspects of mobility during transition to EOB.   Transfers Overall transfer level: Needs assistance Equipment used: 2 person hand held assist Transfers: Lateral/Scoot Transfers          Lateral/Scoot Transfers: Total assist;+2 physical assistance;From elevated surface General transfer comment: Pt attempted to assist with UE's, however became very anxious and held to therapist's arm during transfer instead. Total assist required to achieve scooting to recliner.   Ambulation/Gait                Stairs             Wheelchair Mobility    Modified Rankin (Stroke Patients Only)       Balance Overall balance assessment: Needs assistance Sitting-balance support: Feet supported;Bilateral upper extremity supported Sitting balance-Leahy Scale: Poor Sitting balance - Comments: Pt requires UE support to maintain seated balance.  Postural control: Posterior lean                                   Pertinent Vitals/Pain Pain Assessment: Faces Faces Pain Scale: Hurts whole lot Pain Location: L residual limb Pain Intervention(s): Limited activity within patient's tolerance;Monitored during session;Premedicated before session;Repositioned    Home Living Family/patient expects to be discharged to:: Skilled nursing facility Living Arrangements: Alone                    Prior Function Level of Independence: Needs assistance   Gait / Transfers Assistance Needed: Per pt's aunt, pt did minimal standing activity at SNF. Aunt states that pt has not been out of the bed in 2-3 weeks, however unsure how accurate this is.   ADL's / Homemaking Assistance Needed: Pt's aunt reports that at SNF pt was requiring assistance for all ADL's. The aunt states that pt could have been doing at least some of her ADL's, however was refusing to because staff would do it for her.         Hand Dominance   Dominant Hand: Right    Extremity/Trunk Assessment   Upper Extremity Assessment:  Defer to OT evaluation           Lower Extremity Assessment: LLE deficits/detail   LLE Deficits / Details: Acute pain and guarding consistent with new BKA. Pt with external rotation of hip and flexed knee when PT arrived and aunt reports that this is pt's position of comfort since toe amputations.   Cervical / Trunk Assessment: Normal  Communication   Communication: No difficulties  Cognition Arousal/Alertness: Lethargic;Suspect due to medications Behavior During Therapy: Anxious Overall Cognitive Status:  Within Functional Limits for tasks assessed                      General Comments      Exercises        Assessment/Plan    PT Assessment Patient needs continued PT services  PT Diagnosis Difficulty walking;Generalized weakness;Acute pain   PT Problem List Decreased strength;Decreased range of motion;Decreased activity tolerance;Decreased balance;Decreased mobility;Decreased knowledge of use of DME;Decreased safety awareness;Decreased knowledge of precautions;Pain  PT Treatment Interventions DME instruction;Therapeutic activities;Therapeutic exercise;Neuromuscular re-education;Patient/family education;Functional mobility training;Wheelchair mobility training   PT Goals (Current goals can be found in the Care Plan section) Acute Rehab PT Goals Patient Stated Goal: Decrease pain - was not able to focus on short term goals at this time.  PT Goal Formulation: With patient/family Time For Goal Achievement: 06/14/14 Potential to Achieve Goals: Fair    Frequency Min 2X/week   Barriers to discharge        Co-evaluation               End of Session Equipment Utilized During Treatment: Gait belt Activity Tolerance: Patient limited by pain (and anxiety) Patient left: in chair;with call bell/phone within reach;with family/visitor present Nurse Communication: Mobility status;Need for lift equipment         Time: [id number] PT Time Calculation (min) (ACUTE ONLY): 45 min   Charges:   PT Evaluation $Initial PT Evaluation Tier I: 1 Procedure PT Treatments $Therapeutic Activity: 23-37 mins   PT G Codes:        [name], [name] 05/31/2014, 12:20 PM  Katherine SlipperLaura Aquarius [name], PT, DPT Acute Rehabilitation Services Pager: [phone]

## 2014-05-31 NOTE — Progress Notes (Signed)
[facility] Rounding Note  Subjective:  Drowsy secondary to medication, but does not want to return to [facility] Sitting in the chair crying when I went in to see - almost unconsolable - even with sister's help  Objective: Vital signs in last 24 hours: Temp:  [97.4 F (36.3 C)-98.9 F (37.2 C)] 98.2 F (36.8 C) (02/18 1000) Pulse Rate:  [76-118] 77 (02/18 1000) Resp:  [11-26] 20 (02/18 1000) BP: (92-182)/(22-102) 100/50 mmHg (02/18 1000) SpO2:  [93 %-100 %] 96 % (02/18 1000) Weight:  [70.9 kg (156 lb 4.9 oz)-73.5 kg (162 lb 0.6 oz)] 73.1 kg (161 lb 2.5 oz) (02/17 2055) Weight change:   Intake/Output from previous day: 02/17 0701 - 02/18 0700 In: 570 [I.V.:570] Out: 642 [Blood:100] Intake/Output this shift: Total I/O In: 120 [P.O.:120] Out: -    EXAM: General appearance:  Somnolent, but able to arouse, in no apparent distress  Crying Resp: CTA anteriorly without rales, rhonchi, or wheezes Cardio:  RRR with Gr II/VI systolic murmur, S1, S2, no S3, no rub GI:  + BS, soft and nontender Extremities:  L BKA wrapped, no edema on R Access:  AVF @ RUA with + bruit  Lab Results:  Recent Labs  05/30/14 0922 05/30/14 1530  WBC  --  7.9  HGB 13.3 10.0*  HCT 39.0 33.5*  PLT  --  213   BMET:  Recent Labs  05/30/14 0922 05/30/14 1530  NA 137 137  K 3.8 3.9  CL  --  96  CO2  --  28  GLUCOSE 148* 147*  BUN  --  26*  CREATININE  --  5.62*  CALCIUM  --  9.0  ALBUMIN  --  2.5*   Inpatient Medications: . aspirin  81 mg Oral Daily  . atenolol  25 mg Oral Daily  . clonazePAM  0.5 mg Oral QHS  . [START ON 06/06/2014] darbepoetin (ARANESP) injection - DIALYSIS  40 mcg Intravenous Q Wed-HD  . docusate sodium  100 mg Oral BID  . feeding supplement (RESOURCE BREEZE)  1 Container Oral TID BM  . [START ON 06/01/2014] ferric gluconate (FERRLECIT/NULECIT) IV  125 mg Intravenous Q M,W,F-HD  . gabapentin  300 mg Oral QHS  . insulin aspart  0-9 Units Subcutaneous  TID WC  . insulin aspart  3 Units Subcutaneous TID WC  . insulin detemir  22 Units Subcutaneous QHS  . linagliptin  5 mg Oral Daily  . multivitamin  1 tablet Oral QHS  . pantoprazole  40 mg Oral Daily  . polyethylene glycol  17 g Oral BID  . sevelamer carbonate  2,400 mg Oral TID WC  . [START ON 06/01/2014] vancomycin  1,000 mg Intravenous Q M,W,F-HD    Dialysis Prescription: Currently at [facility] ([place] - due to SNF placement MWF 4 hours EDW 77 2K2Ca bath BFR 400 AVF R Hep 2400  Aranesp 40 QWednesday Hb 11.3 2/10 Vancomycin 1 gm QHD through 06/17/14 Calcitriol on hold due to ca 11.1 on 2/10 Last PTH 90 1/22 (and binder changed from phoslo to renvela)  Assessment/Plan: 1. L BKA - sec to dehiscence/osteo of L foot, yesterday per Dr.Duda, rehab pending. 2. ESRD - HD on MWF @ [facility] 3. T10-11 osteomyelitis - on Vancomycin 1 g with HD until 3/6. 4. HTN/Volume - BP 100/50 on Atenolol 25 mg qd; wt 73.1 kg, lower EDW. 5. Anemia - Hgb 10, Aranesp 40 mcg on Wed, Fe qHD. 6. Sec HPT - Ca  9 (10.2 corrected), P 4.1; Calcitriol on hold, Renvela 3 with meals. 7. Nutrition - Alb 2.5, renal carb-mod diet, vitamin. 8. DM - insulin per primary. 9. PAD - s/p L SFA stenting per Dr. Imogene Burn.   LOS: 1 day   LYLES,CHARLES 05/31/2014,11:01 AM  I have seen and examined this patient and agree with plan as outlined in the above note.  Pt POD 1 from left BKA. Crying from pain, and does not want to return to [facility] - wants closer to home - sister to speak with CSW about finding something closer to here.  For HD tomorrow.  Continuing vancomycin for previously diagnosed thoracic osteo-discitis.Camille Bal B,MD 05/31/2014 12:16 PM

## 2014-05-31 NOTE — Evaluation (Signed)
Occupational Therapy Evaluation Patient Details Name: [name] MRN: [id number] DOB: Jan 06, 1961 Today's Date: 05/31/2014    History of Present Illness Pt is a 54 year old woman with diabetes PVD status post foot salvage intervention who presents at this time with progressive dehiscence ulceration osteomyelitis left foot. Pt is now s/p L BKA.   Clinical Impression   Pt was assisted for the past 2-3 weeks while in a SNF in bathing, dressing and toileting.  She was primarily remaining in the bed.  Pt presents with pain and lethargy with poor activity tolerance.  She is unable to sit without support of B UEs for ADL.  +2 assist was needed for bed mobility.  Transfer was deferred this visit.  Plan is for pt to return to SNF upon d/c.  Will follow acutely.    Follow Up Recommendations  SNF;Supervision/Assistance - 24 hour    Equipment Recommendations       Recommendations for Other Services       Precautions / Restrictions Precautions Precautions: Fall Restrictions Weight Bearing Restrictions: No      Mobility Bed Mobility Overal bed mobility: Needs Assistance;+2 for physical assistance Bed Mobility: Supine to Sit;Sit to Supine     Supine to sit: Total assist;+2 for physical assistance;HOB elevated Sit to supine: +2 for physical assistance;Total assist   General bed mobility comments: verbal cues for technique, HOB raised, use of rail and pad, assist for raising trunk and for LEs  Transfers                 General transfer comment: not performed, pain to great once seated EOB    Balance Overall balance assessment: Needs assistance   Sitting balance-Leahy Scale: Poor Sitting balance - Comments: Pt requires UE support to maintain seated balance.                                     ADL Overall ADL's : Needs assistance/impaired Eating/Feeding: Set up;Bed level   Grooming: Wash/dry hands;Wash/dry face;Oral care;Minimal assistance;Bed level   Upper Body Bathing: Maximal assistance;Bed level   Lower Body Bathing: Total assistance;Bed level   Upper Body Dressing : Moderate assistance;Bed level   Lower Body Dressing: Total assistance;Bed level                 General ADL Comments: Pt unable to balance at EOB without B UE support, decreased tolerance of L LE in dependent position.     Vision     Perception     Praxis      Pertinent Vitals/Pain Pain Assessment: Faces Faces Pain Scale: Hurts whole lot Pain Location: L LE Pain Intervention(s): Limited activity within patient's tolerance;Monitored during session;Premedicated before session;Repositioned     Hand Dominance Right   Extremity/Trunk Assessment Upper Extremity Assessment Upper Extremity Assessment: Generalized weakness (Full AROM, can use functionally for ADL)   Lower Extremity Assessment Lower Extremity Assessment: Defer to PT evaluation   Cervical / Trunk Assessment Cervical / Trunk Assessment: Normal   Communication Communication Communication: No difficulties   Cognition Arousal/Alertness: Lethargic Behavior During Therapy: Anxious Overall Cognitive Status: Difficult to assess (difficulty with reporting PLOF or events at [facility])                     General Comments       Exercises       Shoulder Instructions      Home Living Family/patient  expects to be discharged to:: Skilled nursing facility Living Arrangements: Alone                                      Prior Functioning/Environment Level of Independence: Needs assistance  Gait / Transfers Assistance Needed: Pt reports she was primarily bedbound at the SNF prior to this admission ADL's / Homemaking Assistance Needed: Pt could self feed, groom with set up.  She was assisted for bathing, dressing and toileting a the SNF.        OT Diagnosis: Generalized weakness;Acute pain;Cognitive deficits   OT Problem List: Decreased strength;Decreased activity  tolerance;Impaired balance (sitting and/or standing);Decreased cognition;Decreased knowledge of use of DME or AE;Obesity;Pain   OT Treatment/Interventions: Self-care/ADL training;Therapeutic exercise;Therapeutic activities;DME and/or AE instruction;Patient/family education;Balance training;Cognitive remediation/compensation    OT Goals(Current goals can be found in the care plan section) Acute Rehab OT Goals Patient Stated Goal: Decrease pain - was not able to focus on short term goals at this time.  OT Goal Formulation: With patient Time For Goal Achievement: 06/14/14 Potential to Achieve Goals: Good  OT Frequency: Min 2X/week   Barriers to D/C:            Co-evaluation              End of Session    Activity Tolerance: Patient limited by pain;Patient limited by lethargy Patient left: in bed;with call bell/phone within reach   Time: 1449-1520 OT Time Calculation (min): 31 min Charges:  OT General Charges $OT Visit: 1 Procedure OT Evaluation $Initial OT Evaluation Tier I: 1 Procedure G-Codes:    Evern BioMayberry, Alyda Megna Lynn 05/31/2014, 3:49 PM  [phone]

## 2014-05-31 NOTE — Progress Notes (Signed)
UR Completed.  [phone]  

## 2014-06-01 LAB — RENAL FUNCTION PANEL
Albumin: 2.3 g/dL — ABNORMAL LOW (ref 3.5–5.2)
Anion gap: 10 (ref 5–15)
BUN: 18 mg/dL (ref 6–23)
CALCIUM: 9.5 mg/dL (ref 8.4–10.5)
CHLORIDE: 99 mmol/L (ref 96–112)
CO2: 29 mmol/L (ref 19–32)
Creatinine, Ser: 4.55 mg/dL — ABNORMAL HIGH (ref 0.50–1.10)
GFR, EST AFRICAN AMERICAN: 12 mL/min — AB (ref >90)
GFR, EST NON AFRICAN AMERICAN: 10 mL/min — AB (ref >90)
Glucose, Bld: 90 mg/dL (ref 70–99)
Phosphorus: 2.4 mg/dL (ref 2.3–4.6)
Potassium: 4.1 mmol/L (ref 3.5–5.1)
SODIUM: 138 mmol/L (ref 135–145)

## 2014-06-01 LAB — CBC
HEMATOCRIT: 31.6 % — AB (ref 36.0–46.0)
HEMOGLOBIN: 9.5 g/dL — AB (ref 12.0–15.0)
MCH: 26.4 pg (ref 26.0–34.0)
MCHC: 30.1 g/dL (ref 30.0–36.0)
MCV: 87.8 fL (ref 78.0–100.0)
Platelets: 209 10*3/uL (ref 150–400)
RBC: 3.6 MIL/uL — AB (ref 3.87–5.11)
RDW: 17.9 % — ABNORMAL HIGH (ref 11.5–15.5)
WBC: 7.3 10*3/uL (ref 4.0–10.5)

## 2014-06-01 LAB — GLUCOSE, CAPILLARY
GLUCOSE-CAPILLARY: 78 mg/dL (ref 70–99)
Glucose-Capillary: 126 mg/dL — ABNORMAL HIGH (ref 70–99)

## 2014-06-01 MED ORDER — HYDROMORPHONE HCL 1 MG/ML IJ SOLN
INTRAMUSCULAR | Status: AC
  Filled 2014-06-01: qty 1

## 2014-06-01 MED ORDER — OXYCODONE-ACETAMINOPHEN 5-325 MG PO TABS
ORAL_TABLET | ORAL | Status: AC
  Filled 2014-06-01: qty 2

## 2014-06-01 MED ORDER — OXYCODONE HCL 5 MG PO TABS: 5.0000 mg | ORAL_TABLET | ORAL | Status: AC | PRN

## 2014-06-01 NOTE — Progress Notes (Signed)
[name] Progress Note  Assessment/Plan: 1. Left BKA secondary to wound dehiscence/osteo left foot -2/17 Dr. Lajoyce Cornersuda  2. ESRD - MWF - HD today, check labs pre 3. Anemia - check pre HD CBC Aranesp 40 given 2/17 and q Wed 4. Secondary hyperparathyroidism - hx hypercalcemia recently - probably due to immobility- with vit /d on hold and binder changed to renvela - repeat labs today - keep on 2 Ca bath if K supports 5. HTN/volume - ok, net UF 542 2/17 post Hd weight about 73.1- had been leaving below edw PTA - last post HD weight was 75.8 6. Nutrition - poor Alb 2.5, eating very little; I think depression contributory - have liberalized diet to regular with fluid restriction to improve intake 7. Anxiety/depression - might benefit by SSRI; on klonopin at HS and bid prn 8. T10 - 11 osteo - on Vanc 1 gm q HD through 3/6 - will check pre HD level today; oupt Vanc levels 21-23 in goal 9.  DM - BS ok 10. PAD - s/o Left SFA stent 11. Disp - turned down for rehab previously - wants local NH    Sheffield SliderMartha B Bergman, PA-C [name] [phone] 06/01/2014,10:05 AM  LOS: 2 days   I have seen and examined this patient and agree with plan in the above note. There are nursing notes that she is to be discharged to SNF today - she desperately wants to go to a SN[facility] because her home and family are here. It will be important for us to know if she goes to a local SNFbecause dialysis would have to be re-arranged ([facility] was in SN[facility] there) Jowel Waltner B,MD 06/01/2014 1:55 PM  Subjective:   Tearful; dialysis R[place] in [place] were great but wants to be back home  Objective Filed Vitals:   05/31/14 1000 05/31/14 1700 05/31/14 2124 06/01/14 0421  BP: 100/50 130/54 116/58 104/50  Pulse: 77 78 74 79  Temp: 98.2 F (36.8 C) 98.6 F (37 C) 98.4 F (36.9 C) 98.9 F (37.2 C)  TempSrc: Oral Oral Oral Axillary  Resp: 20 18 17 16    Height:   5\' 1"  (1.549 m)   Weight:   74.72 kg (164 lb 11.6 oz)   SpO2: 96% 97% 97% 96%   Physical Exam General: tearful, eyes closed Heart: RRR Lungs: no rales Abdomen: obese soft Extremities: left BKA wrapped; right LE no edema; left arm puffier than right (sister said it was that way PTA - had prior perm cath on that side removed in Jan) Dialysis Access: right upper AVF + bruit  Dialysis Orders: [facility] ([place] - due to SNF placement MWF 4 hours EDW 77 2K2Ca bath BFR 400 AVF R Hep 2400  Aranesp 40 QWednesday Hb 11.3 2/10 Vancomycin 1 gm QHD through 06/17/14 Calcitriol on hold due to ca 11.1 on 2/10 Last PTH 90 1/22 (and binder changed from phoslo to renvela)  Additional Objective Labs: Basic Metabolic Panel:  Recent Labs Lab 05/30/14 0922 05/30/14 1530  NA 137 137  K 3.8 3.9  CL  --  96  CO2  --  28  GLUCOSE 148* 147*  BUN  --  26*  CREATININE  --  5.62*  CALCIUM  --  9.0  PHOS  --  4.1   Liver Function Tests:  Recent Labs Lab 05/30/14 1530  ALBUMIN 2.5*   CBC:  Recent Labs Lab  05/30/14 0922 05/30/14 1530  WBC  --  7.9  HGB 13.3 10.0*  HCT 39.0 33.5*  MCV  --  89.6  PLT  --  213  CBG:  Recent Labs Lab 05/30/14 2057 05/31/14 0758 05/31/14 1140 05/31/14 1638 05/31/14 2123  GLUCAP 124* 128* 137* 227* 165*  Medications: . sodium chloride     . aspirin  81 mg Oral Daily  . atenolol  25 mg Oral Daily  . clonazePAM  0.5 mg Oral QHS  . [START ON 06/06/2014] darbepoetin (ARANESP) injection - DIALYSIS  40 mcg Intravenous Q Wed-HD  . docusate sodium  100 mg Oral BID  . feeding supplement (RESOURCE BREEZE)  1 Container Oral TID BM  . ferric gluconate (FERRLECIT/NULECIT) IV  125 mg Intravenous Q M,W,F-HD  . gabapentin  300 mg Oral QHS  . insulin aspart  0-9 Units Subcutaneous TID WC  . insulin aspart  3 Units Subcutaneous TID WC  . insulin detemir  22 Units Subcutaneous QHS  . linagliptin  5 mg Oral Daily   . multivitamin  1 tablet Oral QHS  . pantoprazole  40 mg Oral Daily  . polyethylene glycol  17 g Oral BID  . sevelamer carbonate  2,400 mg Oral TID WC  . vancomycin  1,000 mg Intravenous Q M,W,F-HD

## 2014-06-01 NOTE — Progress Notes (Signed)
Patient ID: [name], female   DOB: 04/02/1961, 54 y.o.   MRN: [id number] Patient with expected acute blood loss anemia status post transtibial amputation postoperative day 2. Discharge summary completed for discharge to skilled nursing. Anticipate discharge to skilled nursing today.

## 2014-06-01 NOTE — Clinical Social Work Note (Addendum)
Patient medically stable for discharge back to [facility] today. Discharge information forwarded to facility and patient will be transported back to SNF by ambulance.  Patient's sister Rickard RhymesDebbie Gianino [phone]) contacted and informed about discharge and ambulance transport.   Genelle BalVanessa Loriann Bosserman, MSW, LCSW Licensed Clinical Social Worker Clinical Social Work Department [facility] [phone]

## 2014-06-01 NOTE — Progress Notes (Signed)
Report called to [facility].  Leanna BattlesEckelmann, Cheyne Bungert Eileen, RN.

## 2014-06-01 NOTE — Progress Notes (Signed)
PT Cancellation Note  Patient Details Name: Orson EvaKimberly J Seaberry MRN: [id number] DOB: 02/13/1961   Cancelled Treatment:    Reason Eval/Treat Not Completed: Patient at procedure or test/unavailable. Pt off unit at this time. Will check back as schedule allows.    Conni SlipperKirkman, Khian Remo 06/01/2014, 12:06 PM   Conni SlipperLaura Carla Whilden, PT, DPT Acute Rehabilitation Services Pager: [phone]

## 2014-06-01 NOTE — Procedures (Signed)
I have personally attended this patient's dialysis session.  Although she is crying, has been in this state of mind since her surgery. She seems to be tolerating the dialysis itself fine. AVF 400  No heparin 2K bath (K 4.1)   Katherine Balynthia [name], MD [facility] [phone] Pager 06/01/2014, 2:01 PM

## 2014-06-01 NOTE — Discharge Summary (Signed)
Physician Discharge Summary  Patient ID: Orson EvaKimberly J Marsch MRN: [id number] DOB/AGE: 55/11/1960 54 y.o.  Admit date: 05/30/2014 Discharge date: 06/01/2014  Admission Diagnoses: Abscess ulceration left foot  Discharge Diagnoses:  Active Problems:   Below knee amputation status   Discharged Condition: stable  Hospital Course: Patient's hospital course was essentially unremarkable. She underwent a transtibial amputation. Postoperatively patient progressed slowly with therapy and was discharged to skilled nursing.  Consults: nephrology  Significant Diagnostic Studies: labs: Routine labs  Treatments: dialysis: Hemodialysis and surgery: See operative note  Discharge Exam: Blood pressure 104/50, pulse 79, temperature 98.9 F (37.2 C), temperature source Axillary, resp. rate 16, height 5\' 1"  (1.549 m), weight 74.72 kg (164 lb 11.6 oz), SpO2 96 %. Incision/Wound: dressing clean dry and intact  Disposition: 03-Skilled Nursing Facility  Discharge Instructions    Call MD / Call 911    Complete by:  As directed   If you experience chest pain or shortness of breath, CALL 911 and be transported to the hospital emergency room.  If you develope a fever above 101 F, pus (white drainage) or increased drainage or redness at the wound, or calf pain, call your surgeon's office.     Constipation Prevention    Complete by:  As directed   Drink plenty of fluids.  Prune juice may be helpful.  You may use a stool softener, such as Colace (over the counter) 100 mg twice a day.  Use MiraLax (over the counter) for constipation as needed.     Diet - low sodium heart healthy    Complete by:  As directed      Increase activity slowly as tolerated    Complete by:  As directed             Medication List    STOP taking these medications        vancomycin 1 GM/200ML Soln  Commonly known as:  VANCOCIN      TAKE these medications        aspirin 81 MG chewable tablet  Chew 1 tablet (81 mg total) by  mouth daily.     atenolol 25 MG tablet  Commonly known as:  TENORMIN  Take 25 mg by mouth daily. Unsure of mg  Take at nite     b complex-vitamin c-folic acid 0.8 MG Tabs tablet  Take 1 tablet by mouth at bedtime.     clonazePAM 0.5 MG tablet  Commonly known as:  KLONOPIN  Take 1 tablet (0.5 mg total) by mouth at bedtime.     clonazePAM 0.5 MG tablet  Commonly known as:  KLONOPIN  Take 1 tablet (0.5 mg total) by mouth 2 (two) times daily as needed for anxiety.     doxercalciferol 4 MCG/2ML injection  Commonly known as:  HECTOROL  Inject 2 mLs (4 mcg total) into the vein Every Tuesday,Thursday,and Saturday with dialysis.     famotidine 20 MG tablet  Commonly known as:  PEPCID  Take 20 mg by mouth at bedtime.     feeding supplement (RESOURCE BREEZE) Liqd  Take 1 Container by mouth 3 (three) times daily between meals.     ferric gluconate 125 mg in sodium chloride 0.9 % 100 mL  Inject 125 mg into the vein Every Tuesday,Thursday,and Saturday with dialysis.     gabapentin 300 MG capsule  Commonly known as:  NEURONTIN  Take 1 capsule (300 mg total) by mouth 3 (three) times daily.     insulin aspart 100 UNIT/ML  injection  Commonly known as:  novoLOG  - 0-9 Units, Subcutaneous, 3 times daily with meals  - CBG < 70: implement hypoglycemia protocol  - CBG 70 - 120: 0 units  - CBG 121 - 150: 1 unit  - CBG 151 - 200: 2 units  - CBG 201 - 250: 3 units  - CBG 251 - 300: 5 units  - CBG 301 - 350: 7 units  - CBG 351 - 400: 9 units  - CBG > 400: call MD     insulin detemir 100 UNIT/ML injection  Commonly known as:  LEVEMIR  Inject 0.22 mLs (22 Units total) into the skin at bedtime. Usually takes around midnight     lanthanum 1000 MG chewable tablet  Commonly known as:  FOSRENOL  Chew 1,000 mg by mouth See admin instructions. Takes with meals and snacks     linagliptin 5 MG Tabs tablet  Commonly known as:  TRADJENTA  Take 1 tablet (5 mg total) by mouth daily.      oxyCODONE 5 MG immediate release tablet  Commonly known as:  ROXICODONE  Take 1-2 tablets (5-10 mg total) by mouth every 4 (four) hours as needed for severe pain.     pantoprazole 40 MG tablet  Commonly known as:  PROTONIX  Take 1 tablet (40 mg total) by mouth daily.     polyethylene glycol packet  Commonly known as:  MIRALAX / GLYCOLAX  Take 17 g by mouth 2 (two) times daily.     promethazine 25 MG tablet  Commonly known as:  PHENERGAN  Take 1 tablet (25 mg total) by mouth every 6 (six) hours as needed for nausea or vomiting.     Turmeric Curcumin 500 MG Caps  Take 500 mg by mouth daily.           Follow-up Information    Follow up with DUDA,MARCUS V, MD In 2 weeks.   Specialty:  Orthopedic Surgery   Contact information:   [address] [place] [address] [phone]       Signed: Nadara Mustard 06/01/2014, 6:16 AM

## 2014-06-04 LAB — GLUCOSE, CAPILLARY: Glucose-Capillary: 92 mg/dL (ref 70–99)

## 2014-06-15 ENCOUNTER — Telehealth: Payer: Self-pay | Admitting: *Deleted

## 2014-06-15 NOTE — Telephone Encounter (Signed)
[facility], Last dose of Vanc 06/15/14, OK to stop weekly labs?  Per Dr. Daiva EvesVan Dam, OK to stop weekly labs, pt needs [facility] appt @ [facility].  Left message for K. Marley to contact pt to schedule appt for 2 weeks HSFU.

## 2014-06-27 ENCOUNTER — Inpatient Hospital Stay (HOSPITAL_COMMUNITY): Payer: Medicaid Other

## 2014-06-27 ENCOUNTER — Encounter (HOSPITAL_COMMUNITY): Payer: Self-pay | Admitting: Physician Assistant

## 2014-06-27 ENCOUNTER — Inpatient Hospital Stay (HOSPITAL_COMMUNITY)
Admission: AD | Admit: 2014-06-27 | Discharge: 2014-07-13 | DRG: 564 | Disposition: E | Payer: Medicaid Other | Source: Other Acute Inpatient Hospital | Attending: Pulmonary Disease | Admitting: Pulmonary Disease

## 2014-06-27 DIAGNOSIS — I739 Peripheral vascular disease, unspecified: Secondary | ICD-10-CM | POA: Diagnosis present

## 2014-06-27 DIAGNOSIS — Y835 Amputation of limb(s) as the cause of abnormal reaction of the patient, or of later complication, without mention of misadventure at the time of the procedure: Secondary | ICD-10-CM | POA: Diagnosis not present

## 2014-06-27 DIAGNOSIS — Z7982 Long term (current) use of aspirin: Secondary | ICD-10-CM

## 2014-06-27 DIAGNOSIS — A419 Sepsis, unspecified organism: Secondary | ICD-10-CM | POA: Diagnosis present

## 2014-06-27 DIAGNOSIS — N186 End stage renal disease: Secondary | ICD-10-CM | POA: Diagnosis not present

## 2014-06-27 DIAGNOSIS — E1165 Type 2 diabetes mellitus with hyperglycemia: Secondary | ICD-10-CM | POA: Diagnosis present

## 2014-06-27 DIAGNOSIS — Z9289 Personal history of other medical treatment: Secondary | ICD-10-CM

## 2014-06-27 DIAGNOSIS — D649 Anemia, unspecified: Secondary | ICD-10-CM | POA: Diagnosis not present

## 2014-06-27 DIAGNOSIS — L89610 Pressure ulcer of right heel, unstageable: Secondary | ICD-10-CM | POA: Diagnosis present

## 2014-06-27 DIAGNOSIS — I272 Other secondary pulmonary hypertension: Secondary | ICD-10-CM | POA: Diagnosis present

## 2014-06-27 DIAGNOSIS — Z515 Encounter for palliative care: Secondary | ICD-10-CM

## 2014-06-27 DIAGNOSIS — R6521 Severe sepsis with septic shock: Secondary | ICD-10-CM | POA: Diagnosis present

## 2014-06-27 DIAGNOSIS — Z9911 Dependence on respirator [ventilator] status: Secondary | ICD-10-CM

## 2014-06-27 DIAGNOSIS — Z89422 Acquired absence of other left toe(s): Secondary | ICD-10-CM

## 2014-06-27 DIAGNOSIS — I214 Non-ST elevation (NSTEMI) myocardial infarction: Secondary | ICD-10-CM | POA: Diagnosis present

## 2014-06-27 DIAGNOSIS — Z794 Long term (current) use of insulin: Secondary | ICD-10-CM | POA: Diagnosis not present

## 2014-06-27 DIAGNOSIS — N2581 Secondary hyperparathyroidism of renal origin: Secondary | ICD-10-CM | POA: Diagnosis present

## 2014-06-27 DIAGNOSIS — G9340 Encephalopathy, unspecified: Secondary | ICD-10-CM | POA: Diagnosis present

## 2014-06-27 DIAGNOSIS — F419 Anxiety disorder, unspecified: Secondary | ICD-10-CM | POA: Diagnosis not present

## 2014-06-27 DIAGNOSIS — M4624 Osteomyelitis of vertebra, thoracic region: Secondary | ICD-10-CM | POA: Diagnosis not present

## 2014-06-27 DIAGNOSIS — Z89512 Acquired absence of left leg below knee: Secondary | ICD-10-CM

## 2014-06-27 DIAGNOSIS — I350 Nonrheumatic aortic (valve) stenosis: Secondary | ICD-10-CM | POA: Diagnosis present

## 2014-06-27 DIAGNOSIS — L03116 Cellulitis of left lower limb: Secondary | ICD-10-CM | POA: Diagnosis not present

## 2014-06-27 DIAGNOSIS — R7989 Other specified abnormal findings of blood chemistry: Secondary | ICD-10-CM

## 2014-06-27 DIAGNOSIS — Z885 Allergy status to narcotic agent status: Secondary | ICD-10-CM | POA: Diagnosis not present

## 2014-06-27 DIAGNOSIS — E875 Hyperkalemia: Secondary | ICD-10-CM | POA: Diagnosis present

## 2014-06-27 DIAGNOSIS — I120 Hypertensive chronic kidney disease with stage 5 chronic kidney disease or end stage renal disease: Secondary | ICD-10-CM | POA: Diagnosis present

## 2014-06-27 DIAGNOSIS — Z452 Encounter for adjustment and management of vascular access device: Secondary | ICD-10-CM

## 2014-06-27 DIAGNOSIS — K219 Gastro-esophageal reflux disease without esophagitis: Secondary | ICD-10-CM | POA: Diagnosis not present

## 2014-06-27 DIAGNOSIS — I469 Cardiac arrest, cause unspecified: Secondary | ICD-10-CM | POA: Diagnosis not present

## 2014-06-27 DIAGNOSIS — E872 Acidosis: Secondary | ICD-10-CM | POA: Diagnosis not present

## 2014-06-27 DIAGNOSIS — Z66 Do not resuscitate: Secondary | ICD-10-CM | POA: Diagnosis not present

## 2014-06-27 DIAGNOSIS — A047 Enterocolitis due to Clostridium difficile: Secondary | ICD-10-CM | POA: Diagnosis not present

## 2014-06-27 DIAGNOSIS — T8744 Infection of amputation stump, left lower extremity: Secondary | ICD-10-CM | POA: Diagnosis present

## 2014-06-27 DIAGNOSIS — J9601 Acute respiratory failure with hypoxia: Secondary | ICD-10-CM | POA: Diagnosis not present

## 2014-06-27 DIAGNOSIS — Z79899 Other long term (current) drug therapy: Secondary | ICD-10-CM

## 2014-06-27 DIAGNOSIS — Z992 Dependence on renal dialysis: Secondary | ICD-10-CM | POA: Diagnosis not present

## 2014-06-27 DIAGNOSIS — R4020 Unspecified coma: Secondary | ICD-10-CM | POA: Diagnosis not present

## 2014-06-27 DIAGNOSIS — E1122 Type 2 diabetes mellitus with diabetic chronic kidney disease: Secondary | ICD-10-CM | POA: Diagnosis not present

## 2014-06-27 HISTORY — DX: End stage renal disease: N18.6

## 2014-06-27 LAB — TROPONIN I: Troponin I: 2.59 ng/mL (ref <0.031)

## 2014-06-27 LAB — COMPREHENSIVE METABOLIC PANEL
ALT: 95 U/L — ABNORMAL HIGH (ref 0–35)
ANION GAP: 19 — AB (ref 5–15)
AST: 32 U/L (ref 0–37)
Albumin: 2.3 g/dL — ABNORMAL LOW (ref 3.5–5.2)
Alkaline Phosphatase: 121 U/L — ABNORMAL HIGH (ref 39–117)
BUN: 44 mg/dL — AB (ref 6–23)
CHLORIDE: 102 mmol/L (ref 96–112)
CO2: 18 mmol/L — ABNORMAL LOW (ref 19–32)
CREATININE: 5.77 mg/dL — AB (ref 0.50–1.10)
Calcium: 7.8 mg/dL — ABNORMAL LOW (ref 8.4–10.5)
GFR calc Af Amer: 9 mL/min — ABNORMAL LOW (ref >90)
GFR calc non Af Amer: 8 mL/min — ABNORMAL LOW (ref >90)
Glucose, Bld: 315 mg/dL — ABNORMAL HIGH (ref 70–99)
Potassium: 4.8 mmol/L (ref 3.5–5.1)
Sodium: 139 mmol/L (ref 135–145)
TOTAL PROTEIN: 5.5 g/dL — AB (ref 6.0–8.3)
Total Bilirubin: 1.5 mg/dL — ABNORMAL HIGH (ref 0.3–1.2)

## 2014-06-27 LAB — GLUCOSE, CAPILLARY
GLUCOSE-CAPILLARY: 348 mg/dL — AB (ref 70–99)
Glucose-Capillary: 222 mg/dL — ABNORMAL HIGH (ref 70–99)

## 2014-06-27 LAB — CBC
HEMATOCRIT: 35 % — AB (ref 36.0–46.0)
Hemoglobin: 10.5 g/dL — ABNORMAL LOW (ref 12.0–15.0)
MCH: 26.5 pg (ref 26.0–34.0)
MCHC: 30 g/dL (ref 30.0–36.0)
MCV: 88.4 fL (ref 78.0–100.0)
Platelets: 134 10*3/uL — ABNORMAL LOW (ref 150–400)
RBC: 3.96 MIL/uL (ref 3.87–5.11)
RDW: 19.4 % — AB (ref 11.5–15.5)
WBC: 19.5 10*3/uL — ABNORMAL HIGH (ref 4.0–10.5)

## 2014-06-27 LAB — POCT I-STAT 3, ART BLOOD GAS (G3+)
Acid-base deficit: 11 mmol/L — ABNORMAL HIGH (ref 0.0–2.0)
Bicarbonate: 14.3 mEq/L — ABNORMAL LOW (ref 20.0–24.0)
O2 Saturation: 99 %
PH ART: 7.307 — AB (ref 7.350–7.450)
PO2 ART: 161 mmHg — AB (ref 80.0–100.0)
Patient temperature: 98
TCO2: 15 mmol/L (ref 0–100)
pCO2 arterial: 28.4 mmHg — ABNORMAL LOW (ref 35.0–45.0)

## 2014-06-27 LAB — MRSA PCR SCREENING: MRSA by PCR: NEGATIVE

## 2014-06-27 LAB — ABO/RH: ABO/RH(D): O POS

## 2014-06-27 LAB — TYPE AND SCREEN
ABO/RH(D): O POS
Antibody Screen: NEGATIVE

## 2014-06-27 LAB — APTT: aPTT: 74 seconds — ABNORMAL HIGH (ref 24–37)

## 2014-06-27 LAB — AMMONIA: AMMONIA: 27 umol/L (ref 11–32)

## 2014-06-27 LAB — PROTIME-INR
INR: 1.8 — ABNORMAL HIGH (ref 0.00–1.49)
Prothrombin Time: 21.1 seconds — ABNORMAL HIGH (ref 11.6–15.2)

## 2014-06-27 LAB — LACTIC ACID, PLASMA: Lactic Acid, Venous: 3.4 mmol/L (ref 0.5–2.0)

## 2014-06-27 LAB — FIBRINOGEN: Fibrinogen: 281 mg/dL (ref 204–475)

## 2014-06-27 MED ORDER — PIPERACILLIN-TAZOBACTAM IN DEX 2-0.25 GM/50ML IV SOLN
2.2500 g | Freq: Three times a day (TID) | INTRAVENOUS | Status: DC
  Administered 2014-06-28 (×2): 2.25 g via INTRAVENOUS
  Filled 2014-06-27 (×4): qty 50

## 2014-06-27 MED ORDER — FENTANYL BOLUS VIA INFUSION
50.0000 ug | INTRAVENOUS | Status: DC | PRN
  Filled 2014-06-27: qty 50

## 2014-06-27 MED ORDER — VANCOMYCIN HCL IN DEXTROSE 1-5 GM/200ML-% IV SOLN: 1000.0000 mg | Freq: Once | INTRAVENOUS | Status: DC

## 2014-06-27 MED ORDER — SODIUM CHLORIDE 0.9 % IV BOLUS (SEPSIS): 1000.0000 mL | INTRAVENOUS | Status: AC

## 2014-06-27 MED ORDER — SODIUM CHLORIDE 0.9 % IV SOLN: 250.0000 mL | INTRAVENOUS | Status: DC | PRN

## 2014-06-27 MED ORDER — SODIUM CHLORIDE 0.9 % IV SOLN: INTRAVENOUS | Status: DC

## 2014-06-27 MED ORDER — FENTANYL CITRATE 0.05 MG/ML IJ SOLN: 50.0000 ug | Freq: Once | INTRAMUSCULAR | Status: DC

## 2014-06-27 MED ORDER — NOREPINEPHRINE BITARTRATE 1 MG/ML IV SOLN: 2.0000 ug/min | INTRAVENOUS | Status: DC

## 2014-06-27 MED ORDER — VANCOMYCIN HCL IN DEXTROSE 750-5 MG/150ML-% IV SOLN: 750.0000 mg | INTRAVENOUS | Status: DC

## 2014-06-27 MED ORDER — DARBEPOETIN ALFA 40 MCG/0.4ML IJ SOSY: 40.0000 ug | PREFILLED_SYRINGE | INTRAMUSCULAR | Status: DC

## 2014-06-27 MED ORDER — VASOPRESSIN 20 UNIT/ML IV SOLN
0.0300 [IU]/min | INTRAVENOUS | Status: DC
  Administered 2014-06-27: 0.03 [IU]/min via INTRAVENOUS
  Filled 2014-06-27: qty 2

## 2014-06-27 MED ORDER — INSULIN ASPART 100 UNIT/ML ~~LOC~~ SOLN
2.0000 [IU] | SUBCUTANEOUS | Status: DC
  Administered 2014-06-27: 8 [IU] via SUBCUTANEOUS

## 2014-06-27 MED ORDER — SODIUM CHLORIDE 0.9 % IV SOLN
INTRAVENOUS | Status: DC
  Administered 2014-06-27 (×2): via INTRAVENOUS

## 2014-06-27 MED ORDER — PIPERACILLIN-TAZOBACTAM 3.375 G IVPB 30 MIN
3.3750 g | Freq: Once | INTRAVENOUS | Status: AC
  Administered 2014-06-27: 3.375 g via INTRAVENOUS
  Filled 2014-06-27: qty 50

## 2014-06-27 MED ORDER — MIDAZOLAM HCL 2 MG/2ML IJ SOLN: 2.0000 mg | INTRAMUSCULAR | Status: DC | PRN

## 2014-06-27 MED ORDER — MIDAZOLAM HCL 2 MG/2ML IJ SOLN
2.0000 mg | INTRAMUSCULAR | Status: DC | PRN
Start: 2014-06-27 — End: 2014-06-29

## 2014-06-27 MED ORDER — SODIUM CHLORIDE 0.9 % IV SOLN
25.0000 ug/h | INTRAVENOUS | Status: DC
  Filled 2014-06-27: qty 50

## 2014-06-27 MED ORDER — VANCOMYCIN HCL 10 G IV SOLR
1500.0000 mg | Freq: Once | INTRAVENOUS | Status: AC
  Administered 2014-06-27: 1500 mg via INTRAVENOUS
  Filled 2014-06-27: qty 1500

## 2014-06-27 MED ORDER — PIPERACILLIN-TAZOBACTAM 3.375 G IVPB 30 MIN
3.3750 g | Freq: Once | INTRAVENOUS | Status: DC
  Filled 2014-06-27: qty 50

## 2014-06-27 MED ORDER — NOREPINEPHRINE BITARTRATE 1 MG/ML IV SOLN
5.0000 ug/min | INTRAVENOUS | Status: DC
  Administered 2014-06-27: 10 ug/min via INTRAVENOUS
  Administered 2014-06-27 – 2014-06-28 (×4): 50 ug/min via INTRAVENOUS
  Filled 2014-06-27 (×5): qty 4

## 2014-06-27 MED ORDER — PANTOPRAZOLE SODIUM 40 MG IV SOLR
40.0000 mg | Freq: Every day | INTRAVENOUS | Status: DC
  Filled 2014-06-27: qty 40

## 2014-06-27 MED ORDER — SODIUM CHLORIDE 0.9 % IV BOLUS (SEPSIS): 500.0000 mL | INTRAVENOUS | Status: AC

## 2014-06-27 NOTE — Consult Note (Signed)
  I was asked to provide vascular access for this patient who was in shock. 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 Attempts at insertion of access by the critical care service were unsuccessful. Patient also had a thrombosed right thigh AV graft. I was able to enter the left common femoral vein and passed a triple lumen dialysis catheter over a guidewire into the left common femoral vein secured this with 2 silk sutures and flushed all 3 ports with regular saline. Critical care then proceeded to continue the resuscitative efforts on this patient

## 2014-06-27 NOTE — Progress Notes (Signed)
Hemodialysis- Per primary RN pt is not stable for HD at this time. Please call Hemodialysis if service needed.

## 2014-06-27 NOTE — Progress Notes (Addendum)
ANTIBIOTIC CONSULT NOTE - INITIAL  Pharmacy Consult:  Vancomycin / Zosyn Indication:  Sepsis  Allergies  Allergen Reactions  . Codeine Other (See Comments)    hallucinations    Patient Measurements: Weight: 165 lb 9.1 oz (75.1 kg) Height = 51 inches  Vital Signs: Temp: 97.4 F (36.3 C) (03/16 1700) Temp Source: Oral (03/16 1700) BP: 88/47 mmHg (03/16 1715) Pulse Rate: 56 (03/16 1715)  Labs: No results for input(s): WBC, HGB, PLT, LABCREA, CREATININE in the last 72 hours. CrCl cannot be calculated (Patient has no serum creatinine result on file.). No results for input(s): VANCOTROUGH, VANCOPEAK, VANCORANDOM, GENTTROUGH, GENTPEAK, GENTRANDOM, TOBRATROUGH, TOBRAPEAK, TOBRARND, AMIKACINPEAK, AMIKACINTROU, AMIKACIN in the last 72 hours.   Microbiology: No results found for this or any previous visit (from the past 720 hour(s)).  Medical History: Past Medical History  Diagnosis Date  . Hypertension   . Anemia   . Diabetes mellitus     Type 2  . Chronic kidney disease     T,Th,Sa dialysis  . GERD (gastroesophageal reflux disease)     protonix for "burping"  . Complication of anesthesia     had trouble being put to sleep and then difficulty waking up. States she doesn't have any issues with Propofol  . Anxiety   . ESRD (end stage renal disease)     M/W/F     Assessment: [age] Y[facility] transferred [facility] with [facility].  Patient received Rocephin prior to transfer.  Now to start vancomycin and Zosyn for sepsis.  She has a history of ESRD on HD MWF, but her last session was on 06/22/14 due to clotted graft.  Vanc 3/16 >> Zosyn 3/16 >> CTX 3/16 >> 3/16   Goal of Therapy:  Vanc pre-HD level:  15-25 mcg/mL   Plan:  - Vanc 1500mg  IV x 1, then 750mg  IV q-HD TTS - Zosyn 3.375gm IV x 1 over 30 min, then 2.25gm IV Q8H - Monitor HD schedule, clinical progress, vanc level as indicated    Orin Eberwein D. Laney Potashang, PharmD, BCPS Pager:  [phone] 06/15/2014, 5:45 PM

## 2014-06-27 NOTE — H&P (Addendum)
PULMONARY / CRITICAL CARE MEDICINE   Name: [name] MRN: [id number] DOB: 1960/12/16    ADMISSION DATE:  06/21/2014 CONSULTATION DATE:  06/21/2014  REFERRING MD :  Duke Salvia  CHIEF COMPLAINT:  Septic Shock  INITIAL PRESENTATION:  .   Pt is encephalopathic; therefore, this HPI is obtained from chart review. [name] is a 54 y.o. F with PMH as outlined below which includes ESRD (on dialysis M/W/F, last session 06/22/14).  She apparently had a clotted graft and was unable to have dialysis Mon 3/14.  Had thrombectomy 3/15 and on 3/15 when she went for her treatment, staff at dialysis center found her to be altered so she was sent to [facility] ED for further evaluation. In ED, she was found to be mildly hypotensive which continued to worsen during her stay in ED.  Due to her persistent AMS and inability to protect her airway, she was intubated by EDP.  Central line was placed and pt was resuscitated; however, BP remained soft.  She was subsequently transferred to [facility] ICU for further management of her septic shock.  STUDIES:    SIGNIFICANT EVENTS: 3/16 Intubated in [place] for AMS noted in HD facility and sent to the ED.   HISTORY OF PRESENT ILLNESS:  Pt is encephalopathic; therefore, this HPI is obtained from chart review. [name] is a 54 y.o. F with PMH as outlined below which includes ESRD (on dialysis M/W/F, last session 06/22/14).  She apparently had a clotted graft and was unable to have dialysis Mon 3/14.  Had thrombectomy 3/15 and on 3/15 when she went for her treatment, staff at dialysis center found her to be altered so she was sent to [facility] ED for further evaluation. In ED, she was found to be mildly hypotensive which continued to worsen during her stay in ED.  Due to her persistent AMS and inability to protect her airway, she was intubated by EDP.  Central line was placed and pt was resuscitated; however, BP remained soft.  She was subsequently transferred  to [facility] ICU for further management of her septic shock.  Labs from [facility]: WBC, 11.3, H/H 11.8 / 39.2, Plts 121.   Na 136, K 5.3, Cl 93, CO2 27, BUN 51, SCr 5.5, Glc 189, AG 21.  Lactate 1.9.  Troponin 3.22. AST 38, ALT 140, ALP 140. ABG: 7.41 / 41 / 51.  PAST MEDICAL HISTORY :   has a past medical history of Hypertension; Anemia; Diabetes mellitus; Chronic kidney disease; GERD (gastroesophageal reflux disease); Complication of anesthesia; and Anxiety.   has past surgical history that includes Insertion of dialysis catheter (N/A, 07/16/2013); AV fistula placement (Right, 07/18/2013); Insertion of dialysis catheter (Left, 12/22/2013); Back surgery (17 years); Eye surgery (Bilateral); Hysteroscopy w/ endometrial ablation; Fistula superficialization (Right, 02/16/2014); Fistulogram (N/A, 01/15/2014); lower extremity angiogram (N/A, 04/26/2014); Amputation (Left, 05/04/2014); Below knee leg amputation (Left, 05/30/2014); and Amputation (Left, 05/30/2014). Prior to Admission medications   Medication Sig Start Date End Date Taking? Authorizing Provider  aspirin 81 MG chewable tablet Chew 1 tablet (81 mg total) by mouth daily. 05/07/14   Shanker Levora Dredge, MD  atenolol (TENORMIN) 25 MG tablet Take 25 mg by mouth daily. Unsure of mg  Take at [employer], MD  b complex-vitamin c-folic acid (NEPHRO-VITE) 0.8 MG TABS tablet Take 1 tablet by mouth at bedtime.    Historical Provider, MD  clonazePAM (KLONOPIN) 0.5 MG tablet Take 1 tablet (0.5 mg total) by mouth at bedtime.  05/07/14   Shanker Levora DredgeM Ghimire, MD  clonazePAM (KLONOPIN) 0.5 MG tablet Take 1 tablet (0.5 mg total) by mouth 2 (two) times daily as needed for anxiety. 05/07/14   Shanker Levora DredgeM Ghimire, MD  doxercalciferol (HECTOROL) 4 MCG/2ML injection Inject 2 mLs (4 mcg total) into the vein Every Tuesday,Thursday,and Saturday with dialysis. 07/26/13   Marden Nobleobert Gates, MD  famotidine (PEPCID) 20 MG tablet Take 20 mg by mouth at bedtime.     Historical Provider,  MD  feeding supplement, RESOURCE BREEZE, (RESOURCE BREEZE) LIQD Take 1 Container by mouth 3 (three) times daily between meals. 05/07/14   Shanker Levora DredgeM Ghimire, MD  ferric gluconate 125 mg in sodium chloride 0.9 % 100 mL Inject 125 mg into the vein Every Tuesday,Thursday,and Saturday with dialysis. 07/26/13   Marden Nobleobert Gates, MD  gabapentin (NEURONTIN) 300 MG capsule Take 1 capsule (300 mg total) by mouth 3 (three) times daily. 05/07/14   Shanker Levora DredgeM Ghimire, MD  insulin aspart (NOVOLOG) 100 UNIT/ML injection 0-9 Units, Subcutaneous, 3 times daily with meals CBG < 70: implement hypoglycemia protocol CBG 70 - 120: 0 units CBG 121 - 150: 1 unit CBG 151 - 200: 2 units CBG 201 - 250: 3 units CBG 251 - 300: 5 units CBG 301 - 350: 7 units CBG 351 - 400: 9 units CBG > 400: call MD 05/07/14   Maretta BeesShanker M Ghimire, MD  insulin detemir (LEVEMIR) 100 UNIT/ML injection Inject 0.22 mLs (22 Units total) into the skin at bedtime. Usually takes around midnight 05/07/14   Shanker Levora DredgeM Ghimire, MD  lanthanum (FOSRENOL) 1000 MG chewable tablet Chew 1,000 mg by mouth See admin instructions. Takes with meals and snacks    Historical Provider, MD  linagliptin (TRADJENTA) 5 MG TABS tablet Take 1 tablet (5 mg total) by mouth daily. 07/26/13   Marden Nobleobert Gates, MD  oxyCODONE (ROXICODONE) 5 MG immediate release tablet Take 1-2 tablets (5-10 mg total) by mouth every 4 (four) hours as needed for severe pain. 06/01/14   Nadara MustardMarcus Duda V, MD  pantoprazole (PROTONIX) 40 MG tablet Take 1 tablet (40 mg total) by mouth daily. 07/26/13   Marden Nobleobert Gates, MD  polyethylene glycol [facility](MIRALAX / Ethelene HalGLYCOLAX) packet Take 17 g by mouth 2 (two) times daily. 05/07/14   Shanker Levora DredgeM Ghimire, MD  promethazine (PHENERGAN) 25 MG tablet Take 1 tablet (25 mg total) by mouth every 6 (six) hours as needed for nausea or vomiting. 07/26/13   Marden Nobleobert Gates, MD  Turmeric Curcumin 500 MG CAPS Take 500 mg by mouth daily.    Historical Provider, MD   Allergies  Allergen Reactions  . Codeine  Other (See Comments)    hallucinations    FAMILY HISTORY:  Family History  Problem Relation Age of Onset  . Fibromyalgia Mother   . Ovarian cancer Mother   . Rectal cancer Mother   . Parkinson's disease Father     SOCIAL HISTORY:  reports that she has never smoked. She has never used smokeless tobacco. She reports that she does not drink alcohol or use illicit drugs.  REVIEW OF SYSTEMS:  Unattainable, patient is sedated and intubated.  SUBJECTIVE: None attainable.  VITAL SIGNS: Pulse Rate:  [59] 59 (03/16 1700) Resp:  [16] 16 (03/16 1700) SpO2:  [99 %] 99 % (03/16 1700) FiO2 (%):  [50 %] 50 % (03/16 1700) HEMODYNAMICS:   VENTILATOR SETTINGS: Vent Mode:  [-] PRVC FiO2 (%):  [50 %] 50 % Set Rate:  [16 bmp] 16 bmp Vt Set:  [450 mL]  450 mL PEEP:  [5 cmH20] 5 cmH20 INTAKE / OUTPUT: Intake/Output    None    PHYSICAL EXAMINATION: General: Chronically ill appearing female, sedated on vent. Neuro: Sedated on vent. HEENT: [place]/AT. PERRL, sclerae anicteric. Cardiovascular: RRR, no M/R/G.  Lungs: Respirations even and unlabored.  Coarse bilaterally. Abdomen: BS x 4, soft, NT/ND.  Musculoskeletal: L BKA, R heel ulcer with ecchymosis , no edema.  Skin:  Warm, no rashes.  LABS: from [place] all reviewed Trop of 3 and K of 5.4.  CBC No results for input(s): WBC, HGB, HCT, PLT in the last 168 hours. Coag's No results for input(s): APTT, INR in the last 168 hours. BMET No results for input(s): NA, K, CL, CO2, BUN, CREATININE, GLUCOSE in the last 168 hours. Electrolytes No results for input(s): CALCIUM, MG, PHOS in the last 168 hours. Sepsis Markers No results for input(s): LATICACIDVEN, PROCALCITON, O2SATVEN in the last 168 hours. ABG No results for input(s): PHART, PCO2ART, PO2ART in the last 168 hours. Liver Enzymes No results for input(s): AST, ALT, ALKPHOS, BILITOT, ALBUMIN in the last 168 hours. Cardiac Enzymes No results for input(s): TROPONINI, PROBNP in the last  168 hours. Glucose No results for input(s): GLUCAP in the last 168 hours.  Imaging No results found.   CXR: I reviewed from [place] myself, ETT ok and TLC ok, mild pulmonary edema, CXR from [facility] pending.  EKG: Pending.   ASSESSMENT / PLAN:  PULMONARY OETT 3/16 >>> A: VDRF due to AMS and septic shock, unable to protect her ariway. P:   - Check ABG now. - Full vent support. - Adjust vent to ABG. - Hold weaning until mental status improves.  CARDIOVASCULAR CVL L IJ HD 3/16 >>> A:  Septic shock NSTEMI - Trop 3.05 P:  - Septic shock protocol. - Levophed for BP support. - Follow CVP. - Check cortisol level, if <20 then will add stress dose steroids. - Cards consult called.  RENAL A:   ESRD-HD, was supposed to dialyze Monday but did not then went today and was noted to be lethargic and no dialysis then. P:   - Follow BMET. - Replace electrolytes as indicated. - Renal consult called.  GASTROINTESTINAL A:   No active issues. P:   - PPI. - TF in AM, needs order to be placed.   HEMATOLOGIC A:   No active issues. P:  - Follow CBC. - Transfuse per ICU protocol.  INFECTIOUS A:   Stump infections and cellulitis are the most likely source. Hx T10 - T11 osteomyelitis / diskitis - on IV Vanc P:   BCx2 3/16>>> UCx 3/16>>> Sputum Cx 3/16>>>  Vancomycin 3/16>>> Zosyn 3/16>>>  ENDOCRINE A:   DM P:   CBG's q4hr. SSI. Check cortisol level.  NEUROLOGIC A:   AMS, head CT negative from [place]. P:   Sedation:  Versed/Fentanyl PRN. RASS goal: 0 to -1. Daily WUA.  Family updated: No family bedside.  Interdisciplinary Family Meeting v Palliative Care Meeting:    The patient is critically ill with multiple organ systems failure and requires high complexity decision making for assessment and support, frequent evaluation and titration of therapies, application of advanced monitoring technologies and extensive interpretation of multiple databases.    Critical Care Time devoted to patient care services described in this note is  45  Minutes. This time reflects time of care of this signee Dr Koren Bound. This critical care time does not reflect procedure time, or teaching time or supervisory time of PA/NP/Med student/Med  Resident etc but could involve care discussion time.  Rush Farmer, M.D. [facility] Pulmonary/Critical Care Medicine. Pager: [phone]. After hours pager: [phone].

## 2014-06-27 NOTE — Consult Note (Signed)
Reason for Consult: elevated troponin Primary Cardiologist: new Referring Physician: Dr. Karena Addison is an 54 y.o. female.  HPI: [name] is a 54 yo woman with PMH of hypertension, T2DM, GERD, end-stage renal disease on hemodialysis M/W/F who was found to have a clotted graft and was unable to have hemodialysis on Monday 3/14 leading to thrombectomy on Tuesday 3/15 with subsequent dialysis on 3/15. However, she developed altered mental status and was transferred to [facility] where she was found to be hypotensive with she was transferred to [facility] for higher level of care and concern for septic shock. Here she was found to have poorly/nonfunctioning central line access and had a brief PEA arrest with return of spontaneous circulation and vascular surgery assisted with placement of a left femoral catheter. Cardiology consulted for troponin of 3.05 at [facility].   Past Medical History  Diagnosis Date  . Hypertension   . Anemia   . Diabetes mellitus     Type 2  . Chronic kidney disease     T,Th,Sa dialysis  . GERD (gastroesophageal reflux disease)     protonix for "burping"  . Complication of anesthesia     had trouble being put to sleep and then difficulty waking up. States she doesn't have any issues with Propofol  . Anxiety   . ESRD (end stage renal disease)     M/W/F    Past Surgical History  Procedure Laterality Date  . Insertion of dialysis catheter N/A 07/16/2013    Procedure: INSERTION OF DIALYSIS CATHETER;  Surgeon: Larina Earthly, MD;  Location: [facility] OR;  Service: Vascular;  Laterality: N/A;  . Av fistula placement Right 07/18/2013    Procedure: RIGHT ARTERIOVENOUS (AV) FISTULA CREATION;  Surgeon: Sherren Kerns, MD;  Location: [facility] OR;  Service: Vascular;  Laterality: Right;  . Insertion of dialysis catheter Left 12/22/2013    Procedure: INSERTION OF DIALYSIS CATHETER;  Surgeon: Larina Earthly, MD;  Location: [facility] OR;  Service: Vascular;   Laterality: Left;  . Back surgery  17 years    lumbar surgery  . Eye surgery Bilateral     cataract surgery with lens implants  . Hysteroscopy w/ endometrial ablation    . Fistula superficialization Right 02/16/2014    Procedure:  RIGHT Fistula BRACHIOCEPHALIC Superficialization;  Surgeon: Chuck Hint, MD;  Location: [facility] OR;  Service: Vascular;  Laterality: Right;  . Fistulogram N/A 01/15/2014    Procedure: FISTULOGRAM;  Surgeon: Chuck Hint, MD;  Location: [facility] CATH LAB;  Service: Cardiovascular;  Laterality: N/A;  . Lower extremity angiogram N/A 04/26/2014    Procedure: LOWER EXTREMITY ANGIOGRAM;  Surgeon: Fransisco Hertz, MD;  Location: [facility] CATH LAB;  Service: Cardiovascular;  Laterality: N/A;  . Amputation Left 05/04/2014    Procedure: AMPUTATION 4th and 5th TOES;  Surgeon: Nadara Mustard, MD;  Location: [facility] OR;  Service: Orthopedics;  Laterality: Left;  . Below knee leg amputation Left 05/30/2014    DR DUDA  . Amputation Left 05/30/2014    Procedure: Left Below Knee Amputation ;  Surgeon: Nadara Mustard, MD;  Location: [facility] OR;  Service: Orthopedics;  Laterality: Left;    Family History  Problem Relation Age of Onset  . Fibromyalgia Mother   . Ovarian cancer Mother   . Rectal cancer Mother   . Parkinson's disease Father     Social History:  reports that she has never smoked. She has never used smokeless tobacco. She reports that  she does not drink alcohol or use illicit drugs.  Allergies:  Allergies  Allergen Reactions  . Codeine Other (See Comments)    hallucinations    Medications:  I have reviewed the patient's current medications. Prior to Admission:  Prescriptions prior to admission  Medication Sig Dispense Refill Last Dose  . amLODipine (NORVASC) 5 MG tablet Take 5 mg by mouth at bedtime.   unknown at unknown  . aspirin 81 MG chewable tablet Chew 1 tablet (81 mg total) by mouth daily.   unknown at unknown  . atenolol (TENORMIN) 25 MG tablet Take 25 mg by  mouth daily. Unsure of mg  Take at nite   unknown at unknown  . b complex-vitamin c-folic acid (NEPHRO-VITE) 0.8 MG TABS tablet Take 1 tablet by mouth at bedtime.   unknown at unknown  . Calcium Acetate 667 MG TABS Take 1 tablet by mouth 3 (three) times daily with meals.   unknown at unknown  . clonazePAM (KLONOPIN) 0.5 MG tablet Take 1 tablet (0.5 mg total) by mouth at bedtime. 20 tablet 0 unknown at unknown  . clonazePAM (KLONOPIN) 0.5 MG tablet Take 1 tablet (0.5 mg total) by mouth 2 (two) times daily as needed for anxiety. 20 tablet 0 unknown at unknown  . cyclobenzaprine (FLEXERIL) 10 MG tablet Take 5 mg by mouth at bedtime as needed for muscle spasms. For back spasms/pain   unknown at unknown  . docusate sodium (COLACE) 100 MG capsule Take 100 mg by mouth at bedtime.   unknown at unknown  . doxercalciferol (HECTOROL) 4 MCG/2ML injection Inject 2 mLs (4 mcg total) into the vein Every Tuesday,Thursday,and Saturday with dialysis. 2 mL 0 unknown at unknown  . famotidine (PEPCID) 20 MG tablet Take 20 mg by mouth at bedtime.    unknown at unknown  . feeding supplement, RESOURCE BREEZE, (RESOURCE BREEZE) LIQD Take 1 Container by mouth 3 (three) times daily between meals.  0 unknown at unknown  . fentaNYL (DURAGESIC - DOSED MCG/HR) 12 MCG/HR Place 12.5 mcg onto the skin every 3 (three) days.   unknown at unknown  . ferric gluconate 125 mg in sodium chloride 0.9 % 100 mL Inject 125 mg into the vein Every Tuesday,Thursday,and Saturday with dialysis. 125 ampule 0 unknown at unknown  . gabapentin (NEURONTIN) 100 MG capsule Take 100 mg by mouth at bedtime.   unknown at unknown  . HYDROcodone-acetaminophen (NORCO) 10-325 MG per tablet Take 1 tablet by mouth every 8 (eight) hours as needed for moderate pain.   unknown at unknown  . ibuprofen (ADVIL,MOTRIN) 800 MG tablet Take 800 mg by mouth 3 (three) times daily as needed for moderate pain.   unknown at unknown  . insulin aspart (NOVOLOG) 100 UNIT/ML injection  0-9 Units, Subcutaneous, 3 times daily with meals CBG < 70: implement hypoglycemia protocol CBG 70 - 120: 0 units CBG 121 - 150: 1 unit CBG 151 - 200: 2 units CBG 201 - 250: 3 units CBG 251 - 300: 5 units CBG 301 - 350: 7 units CBG 351 - 400: 9 units CBG > 400: call MD 10 mL 11 unknown at unknown  . insulin detemir (LEVEMIR) 100 UNIT/ML injection Inject 0.22 mLs (22 Units total) into the skin at bedtime. Usually takes around midnight  11 unknown at unknown  . insulin glargine (LANTUS) 100 UNIT/ML injection Inject 35 Units into the skin at bedtime.   unknown at unknown  . lanthanum (FOSRENOL) 1000 MG chewable tablet Chew 1,000 mg by mouth  3 (three) times daily with meals. Takes with meals and snacks   unknown at unknown  . omeprazole (PRILOSEC) 20 MG capsule Take 20 mg by mouth daily.   unknown at unknown  . oxyCODONE (ROXICODONE) 5 MG immediate release tablet Take 1-2 tablets (5-10 mg total) by mouth every 4 (four) hours as needed for severe pain. 30 tablet 0 unknown at unknown  . oxyCODONE-acetaminophen (PERCOCET/ROXICET) 5-325 MG per tablet Take 1 tablet by mouth every 4 (four) hours as needed for moderate pain or severe pain.   unknown at unknown  . pantoprazole (PROTONIX) 40 MG tablet Take 1 tablet (40 mg total) by mouth daily. 30 tablet 5 unknown at unknown  . polyethylene glycol (MIRALAX / GLYCOLAX) packet Take 17 g by mouth 2 (two) times daily. 14 each 0 unknown at unknown  . promethazine (PHENERGAN) 25 MG tablet Take 1 tablet (25 mg total) by mouth every 6 (six) hours as needed for nausea or vomiting. 30 tablet 0 unknown at unknown  . saccharomyces boulardii (FLORASTOR) 250 MG capsule Take 250 mg by mouth 2 (two) times daily.   unknown at unknown  . sertraline (ZOLOFT) 100 MG tablet Take 100 mg by mouth daily.   unknown at unknown  . sevelamer carbonate (RENVELA) 800 MG tablet Take 800 mg by mouth 3 (three) times daily with meals.   unknown at unknown  . Turmeric Curcumin 500 MG CAPS  Take 500 mg by mouth daily.   unknown at unknown  . zolpidem (AMBIEN) 10 MG tablet Take 5 mg by mouth at bedtime as needed for sleep.   unknown at unknown  . gabapentin (NEURONTIN) 300 MG capsule Take 1 capsule (300 mg total) by mouth 3 (three) times daily. (Patient not taking: Reported on 06-30-14)   Not Taking at Unknown time  . linagliptin (TRADJENTA) 5 MG TABS tablet Take 1 tablet (5 mg total) by mouth daily. 30 tablet 5 unknown at unknown   Scheduled: . [START ON 07/04/2014] darbepoetin (ARANESP) injection - DIALYSIS  40 mcg Intravenous Q Wed-HD  . fentaNYL  50 mcg Intravenous Once  . insulin aspart  2-6 Units Subcutaneous 6 times per day  . pantoprazole (PROTONIX) IV  40 mg Intravenous QHS  . [START ON 06/25/2014] piperacillin-tazobactam (ZOSYN)  IV  2.25 g Intravenous 3 times per day  . piperacillin-tazobactam  3.375 g Intravenous Once  . vancomycin  1,500 mg Intravenous Once  . [START ON 06/23/2014] vancomycin  750 mg Intravenous Q M,W,F-HD   Continuous: . sodium chloride    . sodium chloride 125 mL/hr at 30-Jun-2014 2000  . fentaNYL infusion INTRAVENOUS    . norepinephrine (LEVOPHED) Adult infusion 50 mcg/min (06/30/14 1930)  . vasopressin (PITRESSIN) infusion - *FOR SHOCK* 0.03 Units/min (06/30/14 2030)    Results for orders placed or performed during the hospital encounter of 2014/06/30 (from the past 48 hour(s))  MRSA PCR Screening     Status: None   Collection Time: 30-Jun-2014  5:10 PM  Result Value Ref Range   MRSA by PCR NEGATIVE NEGATIVE    Comment:        The GeneXpert MRSA Assay (FDA approved for NASAL specimens only), is one component of a comprehensive MRSA colonization surveillance program. It is not intended to diagnose MRSA infection nor to guide or monitor treatment for MRSA infections.   Glucose, capillary     Status: Abnormal   Collection Time: 30-Jun-2014  5:17 PM  Result Value Ref Range   Glucose-Capillary 222 (H) 70 -  99 mg/dL  Lactic acid, plasma      Status: Abnormal   Collection Time: 06/13/2014  5:21 PM  Result Value Ref Range   Lactic Acid, Venous 3.4 (HH) 0.5 - 2.0 mmol/L    Comment: REPEATED TO VERIFY CRITICAL RESULT CALLED TO, READ BACK BY AND VERIFIED WITH: C SMITH,RN [id number] 07/10/2014 WBOND   Type and screen     Status: None   Collection Time: 07/03/2014  8:45 PM  Result Value Ref Range   ABO/RH(D) O POS    Antibody Screen NEG    Sample Expiration 06/30/2014   CBC     Status: Abnormal   Collection Time: 06/25/2014  8:56 PM  Result Value Ref Range   WBC 19.5 (H) 4.0 - 10.5 K/uL   RBC 3.96 3.87 - 5.11 MIL/uL   Hemoglobin 10.5 (L) 12.0 - 15.0 g/dL   HCT 40.9 (L) 81.1 - 91.4 %   MCV 88.4 78.0 - 100.0 fL   MCH 26.5 26.0 - 34.0 pg   MCHC 30.0 30.0 - 36.0 g/dL   RDW 78.2 (H) 95.6 - 21.3 %   Platelets 134 (L) 150 - 400 K/uL  Protime-INR     Status: Abnormal   Collection Time: 06/17/2014  8:56 PM  Result Value Ref Range   Prothrombin Time 21.1 (H) 11.6 - 15.2 seconds   INR 1.80 (H) 0.00 - 1.49  APTT     Status: Abnormal   Collection Time: 07/05/2014  8:56 PM  Result Value Ref Range   aPTT 74 (H) 24 - 37 seconds    Comment:        IF BASELINE aPTT IS ELEVATED, SUGGEST PATIENT RISK ASSESSMENT BE USED TO DETERMINE APPROPRIATE ANTICOAGULANT THERAPY.   Fibrinogen     Status: None   Collection Time: 07/12/2014  8:56 PM  Result Value Ref Range   Fibrinogen 281 204 - 475 mg/dL    Dg Chest Port 1 View  07/10/2014   CLINICAL DATA:  Multiple attempts to insert the central line.  EXAM: PORTABLE CHEST - 1 VIEW  COMPARISON:  07/08/2014  FINDINGS: Endotracheal tube tip is 2 cm above the carina. The nasogastric tube extends into the stomach. There is no pneumothorax. Mild ground-glass opacities in the left base are unchanged. The right lung is clear.  IMPRESSION: Negative for pneumothorax   Electronically Signed   By: Ellery Plunk M.D.   On: 07/02/2014 21:19   Dg Chest Port 1 View  06/23/2014   CLINICAL DATA:  Hypoxia  EXAM: PORTABLE CHEST  - 1 VIEW  COMPARISON:  Study obtained earlier in the day  FINDINGS: Endotracheal tube tip is 2.9 cm above the carina. Nasogastric tube tip and side port are in the stomach. Central catheter has been removed. No pneumothorax. Lungs are clear. Heart size and pulmonary vascularity are normal. No adenopathy.  IMPRESSION: Tube positions as described without pneumothorax.  Lungs clear.   Electronically Signed   By: Bretta Bang III M.D.   On: 06/23/2014 19:35    Review of Systems  Unable to perform ROS: critical illness   Blood pressure 134/69, pulse 75, temperature 97.4 F (36.3 C), temperature source Oral, resp. rate 17, weight 75.1 kg (165 lb 9.1 oz), SpO2 100 %. Physical Exam  Nursing note and vitals reviewed. Constitutional: She appears distressed.  Intubated, critically ill  HENT:  Intubated, ETT in place  Neck: Normal range of motion. No JVD present. No tracheal deviation present.  Cardiovascular: Normal rate and regular rhythm.  Murmur heard. Systolic ejection murmur mid-peaking heard best at RUSB  Respiratory:  Mechanical ventilation aerating lungs  GI: Soft.  Hypoactive BS  Musculoskeletal:  Bilateral amputations  Neurological:  Intubated, critically ill  Skin:  Cool extremities  Psychiatric:  Unable to assess   Echo 1/16: EF 55-60%, mild AS, mean gradient 20, dilated LA, PASP 82 mmHg Chest x-ray reviewed; ETT in place Lactate 3.4  Assessment/Plan: Problem List Multiorgan failure  Septic Shock ESRD on hemodialysis Elevated troponin Elevated Lactate Mild Aortic Stenosis  Pulmonary Hypertension [name] is a 54 yo woman with PMH of hypertension, T2DM, GERD, end-stage renal disease on hemodialysis M/W/F admitted with hypotension and clinical syndrome of septic shock found to have elevated troponin. [name] is critically ill requiring mechanical ventilation and vasopressor support. Differential diagnosis for elevated troponin includes renal failure  with poor clearance, vasospasm, demand ischemia (Type II NSTEMI), ACS/NSTEMI, pulmonary embolism, heart failure among many other etiologies. I favor a diagnosis of type II NSTEMI; however, given ESRD and other risk factors, there is probably underlying CAD which may or may not be obstructive. For now, would continue to trend cardiac biomarkers. No current indication for anticoagulation for now unless other signs of ischemic develop or clinical picture changes. Daily aspirin 81 mg. Critical care has discussed the acuity of [name]'s illness and she is now DNR as [name] had expressed that she would never want life support. At this juncture, [name] is critically ill and may not recover. If her clinical course changes, then cardiology can reassess need for further evaluation.   Danniel Grenz 07/05/2014, 9:58 PM

## 2014-06-27 NOTE — Progress Notes (Signed)
Pt's SBP dropped to the 40s. Unable to feel the pulse.  PA/NP at bedside. Code blue called.

## 2014-06-27 NOTE — Consult Note (Signed)
[name] is an 54 y.o. female referred by Dr Molli Knock   Chief Complaint: ESRD, Anemia, sec HPTH HPI: 54yo WF with ESRD on HD in [place] MWF.  Found to have clotted access on Mon and had declot done Tues but did not get back to HD unit til late.  Sent to HD unit this AM from NH and access was clotted and she was obtunded.  Sent to [facility] ER, BP there initially 112/49 but then dropped and PO2 51.  Lt IJ triple lumen placed, intubated and sent to [facility].  K was 5.3.  Dx script  [place], MWF, 4hr, BFR 400, DW 72.5Kg, aranesp  Past Medical History  Diagnosis Date  . Hypertension   . Anemia   . Diabetes mellitus     Type 2  . Chronic kidney disease     T,Th,Sa dialysis  . GERD (gastroesophageal reflux disease)     protonix for "burping"  . Complication of anesthesia     had trouble being put to sleep and then difficulty waking up. States she doesn't have any issues with Propofol  . Anxiety   . ESRD (end stage renal disease)     M/W/F    Past Surgical History  Procedure Laterality Date  . Insertion of dialysis catheter N/A 07/16/2013    Procedure: INSERTION OF DIALYSIS CATHETER;  Surgeon: Larina Earthly, MD;  Location: [facility] OR;  Service: Vascular;  Laterality: N/A;  . Av fistula placement Right 07/18/2013    Procedure: RIGHT ARTERIOVENOUS (AV) FISTULA CREATION;  Surgeon: Sherren Kerns, MD;  Location: [facility] OR;  Service: Vascular;  Laterality: Right;  . Insertion of dialysis catheter Left 12/22/2013    Procedure: INSERTION OF DIALYSIS CATHETER;  Surgeon: Larina Earthly, MD;  Location: [facility] OR;  Service: Vascular;  Laterality: Left;  . Back surgery  17 years    lumbar surgery  . Eye surgery Bilateral     cataract surgery with lens implants  . Hysteroscopy w/ endometrial ablation    . Fistula superficialization Right 02/16/2014    Procedure:  RIGHT Fistula BRACHIOCEPHALIC Superficialization;  Surgeon: Chuck Hint, MD;  Location: [facility] OR;  Service: Vascular;  Laterality: Right;  .  Fistulogram N/A 01/15/2014    Procedure: FISTULOGRAM;  Surgeon: Chuck Hint, MD;  Location: [facility] CATH LAB;  Service: Cardiovascular;  Laterality: N/A;  . Lower extremity angiogram N/A 04/26/2014    Procedure: LOWER EXTREMITY ANGIOGRAM;  Surgeon: Fransisco Hertz, MD;  Location: [facility] CATH LAB;  Service: Cardiovascular;  Laterality: N/A;  . Amputation Left 05/04/2014    Procedure: AMPUTATION 4th and 5th TOES;  Surgeon: Nadara Mustard, MD;  Location: [facility] OR;  Service: Orthopedics;  Laterality: Left;  . Below knee leg amputation Left 05/30/2014    DR DUDA  . Amputation Left 05/30/2014    Procedure: Left Below Knee Amputation ;  Surgeon: Nadara Mustard, MD;  Location: [facility] OR;  Service: Orthopedics;  Laterality: Left;    Family History  Problem Relation Age of Onset  . Fibromyalgia Mother   . Ovarian cancer Mother   . Rectal cancer Mother   . Parkinson's disease Father    Social History:  reports that she has never smoked. She has never used smokeless tobacco. She reports that she does not drink alcohol or use illicit drugs.  Allergies:  Allergies  Allergen Reactions  . Codeine Other (See Comments)    hallucinations    Medications Prior to Admission  Medication Sig Dispense  Refill  . aspirin 81 MG chewable tablet Chew 1 tablet (81 mg total) by mouth daily.    Marland Kitchen atenolol (TENORMIN) 25 MG tablet Take 25 mg by mouth daily. Unsure of mg  Take at [employer]    . b complex-vitamin c-folic acid (NEPHRO-VITE) 0.8 MG TABS tablet Take 1 tablet by mouth at bedtime.    . clonazePAM (KLONOPIN) 0.5 MG tablet Take 1 tablet (0.5 mg total) by mouth at bedtime. 20 tablet 0  . clonazePAM (KLONOPIN) 0.5 MG tablet Take 1 tablet (0.5 mg total) by mouth 2 (two) times daily as needed for anxiety. 20 tablet 0  . doxercalciferol (HECTOROL) 4 MCG/2ML injection Inject 2 mLs (4 mcg total) into the vein Every Tuesday,Thursday,and Saturday with dialysis. 2 mL 0  . famotidine (PEPCID) 20 MG tablet Take 20 mg by mouth at bedtime.      . feeding supplement, RESOURCE BREEZE, (RESOURCE BREEZE) LIQD Take 1 Container by mouth 3 (three) times daily between meals.  0  . ferric gluconate 125 mg in sodium chloride 0.9 % 100 mL Inject 125 mg into the vein Every Tuesday,Thursday,and Saturday with dialysis. 125 ampule 0  . gabapentin (NEURONTIN) 300 MG capsule Take 1 capsule (300 mg total) by mouth 3 (three) times daily.    . insulin aspart (NOVOLOG) 100 UNIT/ML injection 0-9 Units, Subcutaneous, 3 times daily with meals CBG < 70: implement hypoglycemia protocol CBG 70 - 120: 0 units CBG 121 - 150: 1 unit CBG 151 - 200: 2 units CBG 201 - 250: 3 units CBG 251 - 300: 5 units CBG 301 - 350: 7 units CBG 351 - 400: 9 units CBG > 400: call MD 10 mL 11  . insulin detemir (LEVEMIR) 100 UNIT/ML injection Inject 0.22 mLs (22 Units total) into the skin at bedtime. Usually takes around midnight  11  . lanthanum (FOSRENOL) 1000 MG chewable tablet Chew 1,000 mg by mouth See admin instructions. Takes with meals and snacks    . linagliptin (TRADJENTA) 5 MG TABS tablet Take 1 tablet (5 mg total) by mouth daily. 30 tablet 5  . oxyCODONE (ROXICODONE) 5 MG immediate release tablet Take 1-2 tablets (5-10 mg total) by mouth every 4 (four) hours as needed for severe pain. 30 tablet 0  . pantoprazole (PROTONIX) 40 MG tablet Take 1 tablet (40 mg total) by mouth daily. 30 tablet 5  . polyethylene glycol (MIRALAX / GLYCOLAX) packet Take 17 g by mouth 2 (two) times daily. 14 each 0  . promethazine (PHENERGAN) 25 MG tablet Take 1 tablet (25 mg total) by mouth every 6 (six) hours as needed for nausea or vomiting. 30 tablet 0  . Turmeric Curcumin 500 MG CAPS Take 500 mg by mouth daily.       Lab Results: UA: ND  No results for input(s): WBC, HGB, HCT, PLT in the last 72 hours. BMET No results for input(s): NA, K, CL, CO2, GLUCOSE, BUN, CREATININE, CALCIUM, PHOS in the last 72 hours.  Invalid input(s): MAG LFT No results for input(s): PROT, ALBUMIN, AST,  ALT, ALKPHOS, BILITOT, BILIDIR, IBILI in the last 72 hours. No results found.  ROS: Pt intubated and thus unobtainable  PHYSICAL EXAM: Blood pressure 88/47, pulse 56, temperature 97.4 F (36.3 C), temperature source Oral, resp. rate 16, weight 75.1 kg (165 lb 9.1 oz), SpO2 100 %. HEENT: intubated NECK:Lt IJ triple lumen LUNGS:Clear ant CARDIAC:RRR w 1/6 systolic M ABD:+ BS, soft ND  No HSM EXT:tr edema Rt lower leg.  Ulcer  with eschar on Rt heel.  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  No bruit in RUA AVF with 2 sutures over AVF NEURO:Obtunded  Assessment: 1. Obtundation and hypotension probably secondary to sepsis 2. Hypoxia ? Sec sepsis vs PE following declot 3. Mild hyperkalemia 4. Anemia on aranesp 40mcg q week 5.  Sec HPTH not currently on Vit D 6. DM 7. PVD PLAN: 1. HD tonight after placement of HD cath and hopefully will tolerate from bp standpoint 2. Consider CT angio to RO PE 3. BC x 2 4. Empiric antibiotics 5. If she survives this she is looking at Lt AKA at some point   Jalani Rominger T 09/09/14, 5:53 PM

## 2014-06-27 NOTE — Progress Notes (Signed)
eLink Physician-Brief Progress Note Patient Name: [name] DOB: 10/22/1960 MRN: [id number]   Date of Service  06/22/2014  HPI/Events of Note  ESRD, recent amputation HD cath manipulated recently, now septic Intubated, stable on camera check   eICU Interventions  Floor team to see now No eICU intervention     Intervention Category Evaluation Type: New Patient Evaluation  MCQUAID, DOUGLAS 07/07/2014, 6:19 PM

## 2014-06-27 NOTE — Progress Notes (Signed)
Pharmacist Gerilyn Nestlehuy D Dang called and said to give loading dose of vancomycin and zosyn.

## 2014-06-27 NOTE — Progress Notes (Signed)
PCCM Interval Progress Note  Attempted to place right femoral hemodialysis catheter.  Able to cannulate vessel easily and guidewire passed although with some difficult.  Upon dilating, guidewire bent in several locations making it impossible to pass hemodialysis catheter over guidewire.  I placed ultrasound probe on pt's right side of neck to assess right IJ.  Right IJ identified but extremely small in diameter.  Pt already had left IJ CVL in place from [facility]; however, was secured at roughly 16 - 17 cm.  RN concerned about placement as she was unable to aspirate blood from any of the 3 ports.  I attempted to pass guidewire down the distal port and exchange CVL for hemodialysis cathter.  Lots of resistance was met with guidewire, making me question whether the CVL could have been misplaced out of the vessel during transport from [place].  Left CVL was subsequently pulled out and levophed was stopped (pt was on 76mg at the time).  I attempted to re-enter the left IJ; however, met resistance with the guidewire again. Left IJ subsequently aborted.  I then moved onto the left femoral site and attempted placement of HD cathether in left femoral vein.  Vessel was difficult to cannulate and after 2 - 3 attempts, I was able to cannulate vessel; however, had minimal blood flow out of needle.  Despite multiple attempts, similar issues encountered.  At this time, pt's SBP was noted to be in the 40's.  I asked RN to assess for a pulse which was not palpable.  I also checked for a carotid pulse and was unable to locate one.  Code Blue called and CPR started.  159mof epinephrine administered immediately and within 10 seconds, pt began to move.  CPR stopped and pulses noted to be present.  Levophed restarted through peripheral IV at max dose.  In addition, I placed a right tibial IO and vasopressin was started through this site.  SBP responded well and was in 115 range shortly thereafter.  Dr. LaKellie Simmeringf  vascular surgery was called and asked to assist with obtaining vascular access.  He kindly agreed and was able to place a left HD catheter in left femoral vein.  We greatly appreciate his assistance in caring for [name].  Family updated at length regarding events.  They appreciated the update and will discuss code status moving forward amongst themselves tonight.  Additional CC time:  45 minutes.  RaMontey HoraPANowthenulmonary & Critical Care Medicine Pager: [phone] ([phone]/16/2016, 8:55 PM

## 2014-06-27 NOTE — Progress Notes (Signed)
eLink Physician-Brief Progress Note Patient Name: [name] DOB: 11/08/1960 MRN: [id number]   Date of Service  07/11/2014  HPI/Events of Note  Elevated troponin> likely reflective of brief cardiac arrest and demand ischemia Lactic acidosis> as above Discussed poor prognosis with family (brother Raiford NobleRick) at length, explained with multi-organ failure, septic shock, NSTEMI her risk of death is exceedingly high.  Family indicates that the patient never wanted life support of any kind.  After discussion they agree with my suggested plan of not escalating care further and writing a DNR order. Will await arrival of sister from [place] and discuss goals of care further.  eICU Interventions  DNR order written     Intervention Category Intermediate Interventions: Diagnostic test evaluation  MCQUAID, DOUGLAS 06/24/2014, 10:22 PM

## 2014-06-27 NOTE — Progress Notes (Signed)
Pt's blood sugar 348, notified MD Mcquaid. Ordered to treat blood sugar with 8 units of insulin. Will continue to monitor and assess.

## 2014-06-28 ENCOUNTER — Inpatient Hospital Stay (HOSPITAL_COMMUNITY): Payer: Medicaid Other

## 2014-06-28 DIAGNOSIS — N186 End stage renal disease: Secondary | ICD-10-CM

## 2014-06-28 DIAGNOSIS — I270 Primary pulmonary hypertension: Secondary | ICD-10-CM

## 2014-06-28 DIAGNOSIS — I214 Non-ST elevation (NSTEMI) myocardial infarction: Secondary | ICD-10-CM

## 2014-06-28 LAB — POCT I-STAT 3, ART BLOOD GAS (G3+)
Acid-base deficit: 11 mmol/L — ABNORMAL HIGH (ref 0.0–2.0)
Bicarbonate: 15.3 mEq/L — ABNORMAL LOW (ref 20.0–24.0)
O2 SAT: 96 %
PO2 ART: 97 mmHg (ref 80.0–100.0)
Patient temperature: 98.7
TCO2: 16 mmol/L (ref 0–100)
pCO2 arterial: 34.1 mmHg — ABNORMAL LOW (ref 35.0–45.0)
pH, Arterial: 7.26 — ABNORMAL LOW (ref 7.350–7.450)

## 2014-06-28 LAB — BASIC METABOLIC PANEL
Anion gap: 15 (ref 5–15)
BUN: 40 mg/dL — AB (ref 6–23)
CALCIUM: 7.8 mg/dL — AB (ref 8.4–10.5)
CO2: 19 mmol/L (ref 19–32)
CREATININE: 5.36 mg/dL — AB (ref 0.50–1.10)
Chloride: 100 mmol/L (ref 96–112)
GFR calc Af Amer: 10 mL/min — ABNORMAL LOW (ref >90)
GFR calc non Af Amer: 8 mL/min — ABNORMAL LOW (ref >90)
Glucose, Bld: 366 mg/dL — ABNORMAL HIGH (ref 70–99)
Potassium: 4.2 mmol/L (ref 3.5–5.1)
Sodium: 134 mmol/L — ABNORMAL LOW (ref 135–145)

## 2014-06-28 LAB — GLUCOSE, CAPILLARY
GLUCOSE-CAPILLARY: 160 mg/dL — AB (ref 70–99)
GLUCOSE-CAPILLARY: 215 mg/dL — AB (ref 70–99)
GLUCOSE-CAPILLARY: 257 mg/dL — AB (ref 70–99)
GLUCOSE-CAPILLARY: 322 mg/dL — AB (ref 70–99)
Glucose-Capillary: 332 mg/dL — ABNORMAL HIGH (ref 70–99)

## 2014-06-28 LAB — CORTISOL: CORTISOL PLASMA: 48.3 ug/dL

## 2014-06-28 LAB — CBC
HEMATOCRIT: 34.6 % — AB (ref 36.0–46.0)
Hemoglobin: 10.2 g/dL — ABNORMAL LOW (ref 12.0–15.0)
MCH: 26.3 pg (ref 26.0–34.0)
MCHC: 29.5 g/dL — ABNORMAL LOW (ref 30.0–36.0)
MCV: 89.2 fL (ref 78.0–100.0)
PLATELETS: 119 10*3/uL — AB (ref 150–400)
RBC: 3.88 MIL/uL (ref 3.87–5.11)
RDW: 19.5 % — AB (ref 11.5–15.5)
WBC: 22.2 10*3/uL — AB (ref 4.0–10.5)

## 2014-06-28 LAB — CLOSTRIDIUM DIFFICILE BY PCR: CDIFFPCR: POSITIVE — AB

## 2014-06-28 LAB — MAGNESIUM: Magnesium: 2 mg/dL (ref 1.5–2.5)

## 2014-06-28 LAB — PHOSPHORUS: Phosphorus: 3.8 mg/dL (ref 2.3–4.6)

## 2014-06-28 LAB — TROPONIN I
TROPONIN I: 2.03 ng/mL — AB (ref <0.031)
TROPONIN I: 2.14 ng/mL — AB (ref <0.031)
Troponin I: 2.06 ng/mL (ref <0.031)

## 2014-06-28 MED ORDER — SODIUM CHLORIDE 0.9 % IV SOLN
100.0000 ug/h | INTRAVENOUS | Status: DC
  Administered 2014-06-28: 100 ug/h via INTRAVENOUS
  Filled 2014-06-28: qty 50

## 2014-06-28 MED ORDER — FENTANYL BOLUS VIA INFUSION
50.0000 ug | INTRAVENOUS | Status: DC | PRN
  Filled 2014-06-28: qty 200

## 2014-06-28 MED ORDER — INSULIN ASPART 100 UNIT/ML ~~LOC~~ SOLN
0.0000 [IU] | SUBCUTANEOUS | Status: DC
  Administered 2014-06-28: 5 [IU] via SUBCUTANEOUS
  Administered 2014-06-28 (×2): 11 [IU] via SUBCUTANEOUS
  Administered 2014-06-28: 8 [IU] via SUBCUTANEOUS

## 2014-06-28 MED ORDER — METRONIDAZOLE IN NACL 5-0.79 MG/ML-% IV SOLN
500.0000 mg | Freq: Three times a day (TID) | INTRAVENOUS | Status: DC
  Administered 2014-06-28: 500 mg via INTRAVENOUS
  Filled 2014-06-28 (×3): qty 100

## 2014-06-28 MED ORDER — LORAZEPAM BOLUS VIA INFUSION
2.0000 mg | INTRAVENOUS | Status: DC | PRN
  Filled 2014-06-28: qty 5

## 2014-06-28 MED ORDER — LORAZEPAM 2 MG/ML IJ SOLN
5.0000 mg/h | INTRAVENOUS | Status: DC
  Administered 2014-06-28: 5 mg/h via INTRAVENOUS
  Filled 2014-06-28: qty 25

## 2014-06-28 MED ORDER — VANCOMYCIN 50 MG/ML ORAL SOLUTION
500.0000 mg | Freq: Four times a day (QID) | ORAL | Status: DC
  Administered 2014-06-28: 500 mg
  Filled 2014-06-28 (×4): qty 10

## 2014-06-28 MED ORDER — NOREPINEPHRINE BITARTRATE 1 MG/ML IV SOLN
5.0000 ug/min | INTRAVENOUS | Status: DC
  Administered 2014-06-28 (×3): 50 ug/min via INTRAVENOUS
  Filled 2014-06-28 (×4): qty 16

## 2014-06-28 NOTE — Progress Notes (Signed)
Pt expired at 2313. Pt family at bedside during progress. Elink notifed in regards to passing. CDS notified as well. Post-mortem checklist in process of being completed.

## 2014-06-28 NOTE — Progress Notes (Signed)
Chaplain responded to page for support of family for pt transitioning to comfort care. Chaplain introduced herself to family and friends. Chaplain offered prayer, read scripture, and offered emotional support. Faith is very important to pt. Chaplain informed family of her services should they need it. Page chaplain as needed [phone].   07/03/2014 2100  Clinical Encounter Type  Visited With Patient and family together  Visit Type Initial;Spiritual support  Referral From Nurse  Consult/Referral To Chaplain  Spiritual Encounters  Spiritual Needs Emotional;Grief support;Prayer;[facility] text  Stress Factors  Family Stress Factors Loss  Jiles HaroldStamey, Elizeth Weinrich F, Chaplain 07/02/2014 9:10 PM

## 2014-06-28 NOTE — Progress Notes (Signed)
eLink Physician-Brief Progress Note Patient Name: [name] DOB: 11/23/1960 MRN: [id number]   Date of Service  07/05/2014  HPI/Events of Note  Called with blood glucose = 322. Previous blood glucose = 348 which was covered with 8 units Novolog Anza.  eICU Interventions  Will increase Novolog SSI coverage to moderate dose.      Intervention Category Intermediate Interventions: Hyperglycemia - evaluation and treatment  [name] Dennard Nipugene 07/01/2014, 12:27 AM

## 2014-06-28 NOTE — Progress Notes (Signed)
CRITICAL VALUE ALERT  Critical value received: Lactic Acid 3.4   Date of notification:  06/26/13  Time of notification:  2145  Critical value read back: Yes   Nurse who received alert:  Hazel Samshristian Pancho Rushing RN   MD notified (1st page): MD [facility]   Time of first page: 2230  MD notified (2nd page):  Time of second page:   Responding MD: MD Kendrick FriesMcquaid   Time MD responded:  2231

## 2014-06-28 NOTE — Progress Notes (Signed)
Patient extubated to comfort care at this time by RRT.  Levophed drip turned off, fentanyl and ativan gtt infusing for comfort.  Family  At bedside, will continue to monitor.

## 2014-06-28 NOTE — Progress Notes (Signed)
RT note-patient remains on ventilator, waiting on family for comfort care extubation.

## 2014-06-28 NOTE — Progress Notes (Signed)
UR Completed.  [phone]  

## 2014-06-28 NOTE — Progress Notes (Signed)
[facility] Progress Note    Assessment/ Plan:   Assessment: 1. Obtundation and hypotension probably secondary to sepsis 2. Hypoxia ? Sec sepsis vs PE following declot 3. Anemia on aranesp 40mcg q week - would not plan on escalating as unlikely to have a great response with the ongoing inflammation. 4. Sec HPTH not currently on Vit D 5. DM 6. PVD PLAN: 1. Too unstable for intermittent HD. Will follow up with CCM to see how aggressive family wants to be. I spent some time with the sister bedside this AM and she's not sure how aggressive the rest of the family wants to be. If the prognosis is grim then the family doesn't appear to want to continue dialysis. If the decision is to proceed with dialysis then we will need to initiate CRRT. 2. Consider CT angio to RO PE. 3. Follow up blood cultures. 4. Empiric broad spectrum antibiotics 5. If she survives this she is looking at Lt AKA at some point  Subjective:   On Vasopressin and Levophed overnight. Multiorgan failure on vent, dialysis dependent and now with septic shock as well.    Objective:   BP 95/44 mmHg  Pulse 74  Temp(Src) 98.6 F (37 C) (Oral)  Resp 14  Wt 79.3 kg (174 lb 13.2 oz)  SpO2 100%  Intake/Output Summary (Last 24 hours) at 02-23-2015 0846 Last data filed at 02-23-2015 0800  Gross per 24 hour  Intake 3972.03 ml  Output      0 ml  Net 3972.03 ml   Weight change:   Physical Exam: HEENT: intubated NECK:Lt IJ triple lumen LUNGS:Clear ant CARDIAC:RRR w 1/6 systolic M ABD:+ BS, soft ND No HSM EXT:tr edema Rt lower leg. Ulcer with eschar on Rt heel. 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 No bruit in RUA AVF. NEURO:Obtunded  Imaging: Dg Chest Port 1 View  May 06, 2014   CLINICAL DATA:  Dyspnea, respiratory failure.  EXAM: PORTABLE CHEST - 1 VIEW  COMPARISON:  06/17/2014  FINDINGS: Endotracheal tube tip is 2.5 cm above the carina. Nasogastric tube  extends into the stomach. There is consolidation and effusion in the left base. The right lung is clear except for minimal linear basilar opacities. The left base consolidation and effusion appear worsened.  IMPRESSION: Support equipment appears well-positioned. Worsening opacity and effusion in the left base.   Electronically Signed   By: Ellery Plunkaniel R Mitchell M.D.   On: 0Jan 24, 2016 06:02   Dg Chest Port 1 View  06/30/2014   CLINICAL DATA:  Multiple attempts to insert the central line.  EXAM: PORTABLE CHEST - 1 VIEW  COMPARISON:  06/12/2014  FINDINGS: Endotracheal tube tip is 2 cm above the carina. The nasogastric tube extends into the stomach. There is no pneumothorax. Mild ground-glass opacities in the left base are unchanged. The right lung is clear.  IMPRESSION: Negative for pneumothorax   Electronically Signed   By: Ellery Plunkaniel R Mitchell M.D.   On: 06/30/2014 21:19   Dg Chest Port 1 View  06/12/2014   CLINICAL DATA:  Hypoxia  EXAM: PORTABLE CHEST - 1 VIEW  COMPARISON:  Study obtained earlier in the day  FINDINGS: Endotracheal tube tip is 2.9 cm above the carina. Nasogastric tube tip and side port are in the stomach. Central catheter has been removed. No pneumothorax. Lungs are clear. Heart size and pulmonary vascularity are normal. No adenopathy.  IMPRESSION: Tube positions as described without pneumothorax.  Lungs clear.   Electronically  Signed   By: Bretta Bang III M.D.   On: 06/16/2014 19:35    Labs: BMET  Recent Labs Lab 07/02/2014 2056 06/21/2014 0511  NA 139 134*  K 4.8 4.2  CL 102 100  CO2 18* 19  GLUCOSE 315* 366*  BUN 44* 40*  CREATININE 5.77* 5.36*  CALCIUM 7.8* 7.8*  PHOS  --  3.8   CBC  Recent Labs Lab 07/09/2014 2056 06/12/2014 0511  WBC 19.5* 22.2*  HGB 10.5* 10.2*  HCT 35.0* 34.6*  MCV 88.4 89.2  PLT 134* 119*    Medications:    . [START ON 07/04/2014] darbepoetin (ARANESP) injection - DIALYSIS  40 mcg Intravenous Q Wed-HD  . fentaNYL  50 mcg Intravenous Once  .  insulin aspart  0-15 Units Subcutaneous 6 times per day  . pantoprazole (PROTONIX) IV  40 mg Intravenous QHS  . piperacillin-tazobactam (ZOSYN)  IV  2.25 g Intravenous 3 times per day  . [START ON 2014-07-16] vancomycin  750 mg Intravenous Q M,W,F-HD      Paulene Floor, MD 06/18/2014, 8:46 AM

## 2014-06-28 NOTE — Progress Notes (Signed)
Inpatient Diabetes Program Recommendations  AACE/ADA: New Consensus Statement on Inpatient Glycemic Control (2013)  Target Ranges:  Prepandial:   less than 140 mg/dL      Peak postprandial:   less than 180 mg/dL (1-2 hours)      Critically ill patients:  140 - 180 mg/dL   Results for Orson EvaSHOFFNER, Itzamara J (MRN [id number]) as of 07/12/2014 10:54  Ref. Range 06/13/2014 21:22 07/12/2014 23:41 06/16/2014 04:33 06/15/2014 08:33  Glucose-Capillary Latest Range: 70-99 mg/dL 409348 (H) 811322 (H) 914332 (H) 257 (H)    Reason for Visit: AMS, Septic Shock  Diabetes history: Dm 2 Outpatient Diabetes medications: Novolog 0-9 units TID, Tradjenta 5 mg Daily, order for Lantus 35 units QHS and Levemir 22 units QHS unsure which she takes.  Current orders for Inpatient glycemic control: Novolog 0-15 units Q4hrs  Inpatient Diabetes Program Recommendations Insulin - IV drip/GlucoStabilizer: Patient recieved at total of 38 units of Novolog since 2200 last pm. Patient's glucose between 250-350 mg/dl. Please discontinue current glycemic control order set and consider placing patient on the ICU Glycemic Control Order Set and start phase 2 IV insulin.  Thanks,  Christena DeemShannon Callie Bunyard RN, MSN, [facility] Inpatient Diabetes Coordinator Team Pager [phone]

## 2014-06-28 NOTE — Progress Notes (Signed)
Chaplain responded to Code Blue to 2MW Room02.  Upon arrival to the Unit the medical staff was providing medical intervention for the patient.  The Chaplain had no contact with the patient at this time, follow up will be done at a later time to provide pastoral support as needed.    Chaplain spoke to Nursing Staff at follow-up visit, and was informed patient was stable with support.  An inquiry was made for family support that might be needed, staff reported that family would appreciate the support, but were not present at the time of this visit. A referral will be made to the assigned Unit Chaplain to offer pastoral presence and support. On-Call Chaplain Janell Quietudrey Thornton [phone]

## 2014-06-28 NOTE — Progress Notes (Signed)
  Chart reviewed, plans for comfort care.  No nutrition interventions at this time.  Please consult RD if nutrition related issues arise.   Magdalen SpatzLauren Deriyah Kunath MS Dietetic Intern Pager Number [phone]

## 2014-06-28 NOTE — Progress Notes (Signed)
CRITICAL VALUE ALERT  Critical value received:  Tropinin 2.59   Date of notification:  06/26/13   Time of notification:  2210  Critical value read back: Yes  Nurse who received alert:  Ledell Peoplesasey Moore RN   MD notified (1st page): MD [facility]   Time of first page:  2230   MD notified (2nd page):  Time of second page:  Responding MD: MD Kendrick FriesMcquaid   Time MD responded:  2231

## 2014-06-28 NOTE — Progress Notes (Signed)
PULMONARY / CRITICAL CARE MEDICINE   Name: Katherine EvaKimberly J Orner MRN: [id number] DOB: 05/29/1960    ADMISSION DATE:  07/07/2014    HISTORY OF PRESENT ILLNESS:  Pt is encephalopathic; therefore, this HPI is obtained from chart review. [name] is a 54 y.o. F with PMH as outlined below which includes ESRD (on dialysis M/W/F, last session 06/22/14).  She apparently had a clotted graft and was unable to have dialysis Mon 3/14.  Had thrombectomy 3/15 and on 3/15 when she went for her treatment, staff at dialysis center found her to be altered so [facility]. In ED, she was found to be mildly hypotensive which continued to worsen during her stay in ED.  Due to her persistent AMS and inability to protect her airway, she was intubated by EDP.  Central line was placed and pt was resuscitated; however, BP remained soft.  [facility] for further management of her septic shock.     SIGNIFICANT EVENTS: 3/16 [place]ub[place] [facility] for AMS noted in HD facility and sent to the ED. [facility] with septic shock and apparent LLE stump infection. Very difficult access. Ultimately required vasc surg consult for placement of L femoral trialysis catheter 3/17 C diff positive. Remains on high dose vasopressors and mechanical ventilation. Detailed conversation with family regarding poor prognosis in light of her baseline poor health status and on the severity of her acute critical illness. It was explained that her chance of survival was small and that her best possible survival would entail a significant setback from her very poor functional status. If she were to survive, she would likely need stump revision and she would be left with the very difficult problem of dialysis access. After consideration of these factors, the family is in unanimous agreement that she would not wish for us to continue aggressive support as she was already  quite dissatisfied with her QOL prior to this illness and was making very little improvement in rehab. She was made DNR and it was decided that CRRT would not be initiated. The withdrawal of life sustaining therapies protocol was ordered to be implemented after the patient's father has been able to come to the hospital to see her    INDWELLING DEVICES:: ETT 3/16 L femoral HD cath 3/16  MICRO DATA: Blood 3/16 >>  C diff 3/16 >> POS  ANTIMICROBIALS:  Vanc 3/16 >>  Pip-tazo 3/16 >>  Enteral vanc 3/17 >>  Metronidazole 3/17 >>    SUBJECTIVE:  Comatose. NAD  VITAL SIGNS: Temp:  [97.4 F (36.3 C)-99.2 F (37.3 C)] 99.2 F (37.3 C) (03/17 1305) Pulse Rate:  [55-79] 74 (03/17 1100) Resp:  [14-25] 24 (03/17 1100) BP: (47-145)/(26-91) 145/80 mmHg (03/17 1100) SpO2:  [96 %-100 %] 100 % (03/17 1100) FiO2 (%):  [40 %-50 %] 40 % (03/17 0802) Weight:  [75.1 kg (165 lb 9.1 oz)-79.3 kg (174 lb 13.2 oz)] 79.3 kg (174 lb 13.2 oz) (03/17 0452) HEMODYNAMICS:   VENTILATOR SETTINGS: Vent Mode:  [-] PRVC FiO2 (%):  [40 %-50 %] 40 % Set Rate:  [16 bmp] 16 bmp Vt Set:  [450 mL] 450 mL PEEP:  [5 cmH20] 5 cmH20 Plateau Pressure:  [17 cmH20-20 cmH20] 20 cmH20 INTAKE / OUTPUT: Intake/Output      03/16 0701 - 03/17 0700 03/17 0701 - 03/18 0700   I.V. (mL/kg) 3238.8 (40.8) 183.2 (2.3)   IV Piggyback 550  Total Intake(mL/kg) 3788.8 (47.8) 183.2 (2.3)   Urine (mL/kg/hr) 0    Total Output 0     Net +3788.8 +183.2         PHYSICAL EXAMINATION: General: Chronically ill appearing female, sedated on vent. Neuro: Sedated on vent. HEENT: [place]/AT. PERRL, sclerae anicteric. Cardiovascular: RRR, no M/R/G.  Lungs: Respirations even and unlabored.  Coarse bilaterally. Abdomen: BS x 4, soft, NT/ND.  Musculoskeletal: L BKA, R heel ulcer with ecchymosis , no edema.  Skin:  Warm, no rashes.  LABS:  I have reviewed all of today's lab results. Relevant abnormalities are discussed in the A/P  section   CXR: Support equipment appears well-positioned. Worsening opacity and effusion in the left base    ASSESSMENT: VDRF due to AMS and septic shock, unable to protect her ariway. Septic shock NSTEMI - Trop 3.05 ESRD-HD dependent Stump infections and cellulitis are the most likely source. Hx T10 - T11 osteomyelitis / diskitis  C diff colitis DM 2 Acute encephalopathy Severe baseline debilitation   PLAN:   Cont current support for now until rest of family arrives Withdrawal protocol ordered to be implemented tonight as documented above DNR   CCM X 40 mins  Billy Fischer, MD ; [facility] service Mobile [phone].  After 5:30 PM or weekends, call [phone]

## 2014-06-28 NOTE — Consult Note (Addendum)
WOC wound consult note Reason for Consult: Consult requested for left stump wounds.  Pt was followed by Dr Lajoyce Cornersuda of the ortho service for surgery last month. Pt is critically ill with multiple systemic factors which can impair healing. Wound type: Left posterior stump with full thickness wound; 5X7cm 80% tightly adhered eschar/slough, 20% red and moist.  Mod amt tan drainage, skin peeling surrounding wound.  Fluctuant when probed with swab, no odor.   Anterior/inner left stump 5X10cm full thickness wound 90% tightly adhered eschar, 10% red and moist, small tan amt drainage, no odor. Entire stump is pale and slightly dusky-purplish in color.   Anterior left knee with partial thickness abrasion, 2X2.5cm red and moist with mod amt yellow drainage, blisters surrounding area. Right heel with unstageable pressure ulcer, 4X5cm area of tightly adhered eschar which is draining small amt tan liquid and edges are beginning to lift and open.  Surrounded by 8X8cm area of boggy, fluctuant dark purple skin where previous blister has ruptured and evolved. Pressure Ulcer POA: Yes Float right heel to reduce pressure.  Foam dressings to all wounds until further plan of care is provided by ortho service. Please consult Dr Lajoyce Cornersuda of the ortho service for assessment and plan of care to left stump and right heel.  He performed surgery last month and the necrotic wounds are beyond WOC scope of practice. Discussed with bedside nurse who will notify critical care team this AM during patient rounds. Please re-consult if further assistance is needed.  Thank-you,  Cammie Mcgeeawn Gilbert Narain MSN, RN, CWOCN, [place]-AP, CNS [phone]

## 2014-06-28 NOTE — Procedures (Signed)
Extubation Procedure Note  Patient Details:   Name: [name] DOB: 02/15/1961 MRN: [id number]   Airway Documentation:     Evaluation  O2 sats: stable throughout Complications: No apparent complications Patient did tolerate procedure well. Bilateral Breath Sounds: Rhonchi Suctioning: Airway Yes  Comfort care extubation  Newt LukesGroendal, Etheline Ann 07/02/2014, 6:59 PM

## 2014-06-28 NOTE — Progress Notes (Signed)
Patient Name: Orson EvaKimberly J Emmerich Date of Encounter: 06-30-14  Active Problems:   Sepsis   Acute respiratory failure with hypoxia   Septic shock   Length of Stay: 1  SUBJECTIVE  The patient is intubated, sedated, requiring combination of pressors, worsening leukocytosis, the etiology is unknown - ? Sepsis, too unstable to undergo HD, C.diff positive.   CURRENT MEDS . [START ON 07/04/2014] darbepoetin (ARANESP) injection - DIALYSIS  40 mcg Intravenous Q Wed-HD  . fentaNYL  50 mcg Intravenous Once  . insulin aspart  0-15 Units Subcutaneous 6 times per day  . pantoprazole (PROTONIX) IV  40 mg Intravenous QHS  . piperacillin-tazobactam (ZOSYN)  IV  2.25 g Intravenous 3 times per day  . [START ON 07/07/2014] vancomycin  750 mg Intravenous Q M,W,F-HD   . sodium chloride    . sodium chloride 125 mL/hr at Dec 23, 2014 0800  . fentaNYL infusion INTRAVENOUS    . norepinephrine (LEVOPHED) Adult infusion 50 mcg/min (Dec 23, 2014 0803)  . vasopressin (PITRESSIN) infusion - *FOR SHOCK* 0.03 Units/min (Dec 23, 2014 0800)    OBJECTIVE  Filed Vitals:   Dec 23, 2014 0500 Dec 23, 2014 0600 Dec 23, 2014 0700 Dec 23, 2014 0802  BP: 91/76 107/72 112/91 95/44  Pulse: 73 71 72 74  Temp:      TempSrc:      Resp: 16 17 19 14   Weight:      SpO2: 100% 100% 100% 100%    Intake/Output Summary (Last 24 hours) at Dec 23, 2014 1039 Last data filed at Dec 23, 2014 0800  Gross per 24 hour  Intake 3972.03 ml  Output      0 ml  Net 3972.03 ml   Filed Weights   07/11/2014 1700 Dec 23, 2014 0452  Weight: 165 lb 9.1 oz (75.1 kg) 174 lb 13.2 oz (79.3 kg)    PHYSICAL EXAM  Sedated, intubated HEENT:  Normal  Neck: Supple without bruits or JVD. Lungs:  Resp regular and unlabored, mild crackles at the basis. Heart: RRR no s3, s4, 2/6 systolic murmur. Abdomen: Soft, non-tender, non-distended, BS + x 4.  Extremities: Left BKA, mild right LE edema  Accessory Clinical Findings  CBC  Recent Labs  06/14/2014 2056 Dec 23, 2014 0511  WBC  19.5* 22.2*  HGB 10.5* 10.2*  HCT 35.0* 34.6*  MCV 88.4 89.2  PLT 134* 119*   Basic Metabolic Panel  Recent Labs  07/08/2014 2056 Dec 23, 2014 0511  NA 139 134*  K 4.8 4.2  CL 102 100  CO2 18* 19  GLUCOSE 315* 366*  BUN 44* 40*  CREATININE 5.77* 5.36*  CALCIUM 7.8* 7.8*  MG  --  2.0  PHOS  --  3.8   Liver Function Tests  Recent Labs  06/26/2014 2056  AST 32  ALT 95*  ALKPHOS 121*  BILITOT 1.5*  PROT 5.5*  ALBUMIN 2.3*   Cardiac Enzymes  Recent Labs  Dec 23, 2014 0056 Dec 23, 2014 0507 Dec 23, 2014 0511  TROPONINI 2.14* 2.06* 2.03*   Radiology/Studies  Dg Chest Port 1 View  06-30-14   CLINICAL DATA:  Dyspnea, respiratory failure.  EXAM: PORTABLE CHEST - 1 VIEW  COMPARISON:  06/12/2014  FINDINGS: Endotracheal tube tip is 2.5 cm above the carina. Nasogastric tube extends into the stomach. There is consolidation and effusion in the left base. The right lung is clear except for minimal linear basilar opacities. The left base consolidation and effusion appear worsened.  IMPRESSION: Support equipment appears well-positioned. Worsening opacity and effusion in the left base.   Electronically Signed   By: Ellery Plunkaniel R Mitchell M.D.   On:  07/06/2014 06:02   Dg Chest Port 1 View  07/09/2014   CLINICAL DATA:  Multiple attempts to insert the central line.  EXAM: PORTABLE CHEST - 1 VIEW  COMPARISON:  06/25/2014  FINDINGS: Endotracheal tube tip is 2 cm above the carina. The nasogastric tube extends into the stomach. There is no pneumothorax. Mild ground-glass opacities in the left base are unchanged. The right lung is clear.  IMPRESSION: Negative for pneumothorax.    TELE:   ECG:      ASSESSMENT AND PLAN  Problem List Multiorgan failure  Septic Shock ESRD on hemodialysis Elevated troponin Elevated Lactate Mild Aortic Stenosis  Pulmonary Hypertension  Ms. Donovan Gatchel is a 54 yo woman with PMH of hypertension, T2DM, GERD, end-stage renal disease on hemodialysis M/W/F who was  found to have a clotted graft and was unable to have hemodialysis on Monday 3/14 leading to thrombectomy on Tuesday 3/15 with subsequent dialysis on 3/15. However, she developed altered mental status and was transferred to [facility] where she was found to be hypotensive with she was transferred to [facility] for higher level of care and concern for septic shock. Here she was found to have poorly/nonfunctioning central line access and had a brief PEA arrest with return of spontaneous circulation and vascular surgery assisted with placement of a left femoral catheter. Cardiology consulted for troponin of 3.05 at [facility]   She requires mechanical ventilation and ongoing vasopressor support. Differential diagnosis for elevated troponin includes renal failure with poor clearance, vasospasm, demand ischemia (Type II NSTEMI), ACS/NSTEMI, pulmonary embolism, heart failure among many other etiologies. I favor a diagnosis of type II NSTEMI; however, given ESRD and other risk factors, there is probably underlying CAD which may or may not be obstructive.   The fact that she continues to have flat troponin results supports type II NSTEMI.  No current indication for anticoagulation for now unless other signs of ischemic develop or clinical picture changes. Daily aspirin 81 mg.  She is currently DNR, her prognosis is poor.  We would consider cath if she recovers from an acute illness.   Signed, Lars Masson MD, [facility] 2014/07/06

## 2014-06-28 NOTE — Progress Notes (Signed)
CRITICAL VALUE ALERT  Critical value received: Aerobic bottle gram + cocci with clusters   Date of notification: 01/23/2015  Time of notification:  2100  Critical value read back: Yes  Nurse who received alert:  Hazel Samshristian Leesa Leifheit RN   MD notified (1st page):  MD Mcquaid  Time of first page:  2145  MD notified (2nd page):  Time of second page:  Responding MD:  MD Kendrick FriesMcquaid  Time MD responded: 2146

## 2014-06-29 LAB — CULTURE, BLOOD (ROUTINE X 2)

## 2014-07-02 ENCOUNTER — Telehealth: Payer: Self-pay

## 2014-07-02 NOTE — Telephone Encounter (Signed)
[facility]  [facility] Dr[facility] Sung AmabileSimonds to sign.  Received back 07/03/14.  Called [facility] for pick-up.

## 2014-07-13 NOTE — Progress Notes (Signed)
Wasted 10mls of Ativan and 200 mls of Fentanyl IV drips. Witnessed by Tory EmeraldMichelle Toler RN

## 2014-07-13 DEATH — deceased

## 2014-07-18 ENCOUNTER — Other Ambulatory Visit (HOSPITAL_COMMUNITY): Payer: Self-pay

## 2014-07-18 ENCOUNTER — Ambulatory Visit: Payer: Self-pay | Admitting: Vascular Surgery

## 2014-07-26 NOTE — Discharge Summary (Signed)
DEATH SUMMARY  DATE OF ADMISSION:  06/26/2014  DATE OF DISCHARGE/DEATH:  11-30-2014  ADMISSION DIAGNOSES:   Acute ventilator dependent respiratory failure Septic shock NSTEMI ESRD Recent L BKA LLE cellulitis vs stump infection H/O osteomyelitis/diskitis  Acute encephalopathy  DISCHARGE DIAGNOSES:   VDRF due to AMS and septic shock Septic shock NSTEMI  ESRD-HD dependent LLE stump infection and cellulitis Hx T10 - T11 osteomyelitis / diskitis  C diff colitis DM 2 Acute encephalopathy Severe baseline debilitation   PRESENTATION:   Pt was admitted with the following HPI and the above admission diagnoses:   INITIAL PRESENTATION: .  Pt is encephalopathic; therefore, this HPI is obtained from chart review. [name] is a 54 y.o. F with PMH as outlined below which includes ESRD (on dialysis M/W/F, last session 06/22/14). She apparently had a clotted graft and was unable to have dialysis Mon 3/14. Had thrombectomy 3/15 and on 3/15 when she went for her treatment, staff at dialysis center found her to be altered so [facility]. In ED, she was found to be mildly hypotensive which continued to worsen during her stay in ED. Due to her persistent AMS and inability to protect her airway, she was intubated by EDP. Central line was placed and pt was resuscitated; however, BP remained soft. [facility] was subsequently [facility] for further management of her septic shock.  HOSPITAL COURSE:   3/16 [facility]ub[facility] for AMS noted in HD facility and sent to the ED. [facility] with septic shock and apparent LLE stump infection. Very difficult access. Ultimately required vasc surg consult for placement of L femoral trialysis catheter 3/17 C diff positive. Remained on high dose vasopressors and mechanical ventilation. Detailed conversation with family regarding poor prognosis in light of her baseline poor health status and on the  severity of her acute critical illness. It was explained that her chance of survival was small and that her best possible survival would entail a significant setback from her very poor functional status. If she were to survive, she would likely need stump revision and she would be left with the very difficult problem of dialysis access. After consideration of these factors, the family was in unanimous agreement that she would not wish for us to continue aggressive support as she was already quite dissatisfied with her QOL prior to this illness and was making very little improvement in rehab. She was made DNR and it was decided that CRRT would not be initiated. The withdrawal of life sustaining therapies protocol was ordered to be implemented after the patient's father has been able to come to the hospital to see her   Billy Fischeravid Yomar Mejorado, MD;  PCCM service; Mobile [phone]

## 2015-03-06 IMAGING — CR DG CHEST 1V PORT
1 series · 1 of 1 positions shown · non-contrast
Comparison: 04/23/2014

CLINICAL DATA: Fever.

EXAM:
PORTABLE CHEST - 1 VIEW

[AP]
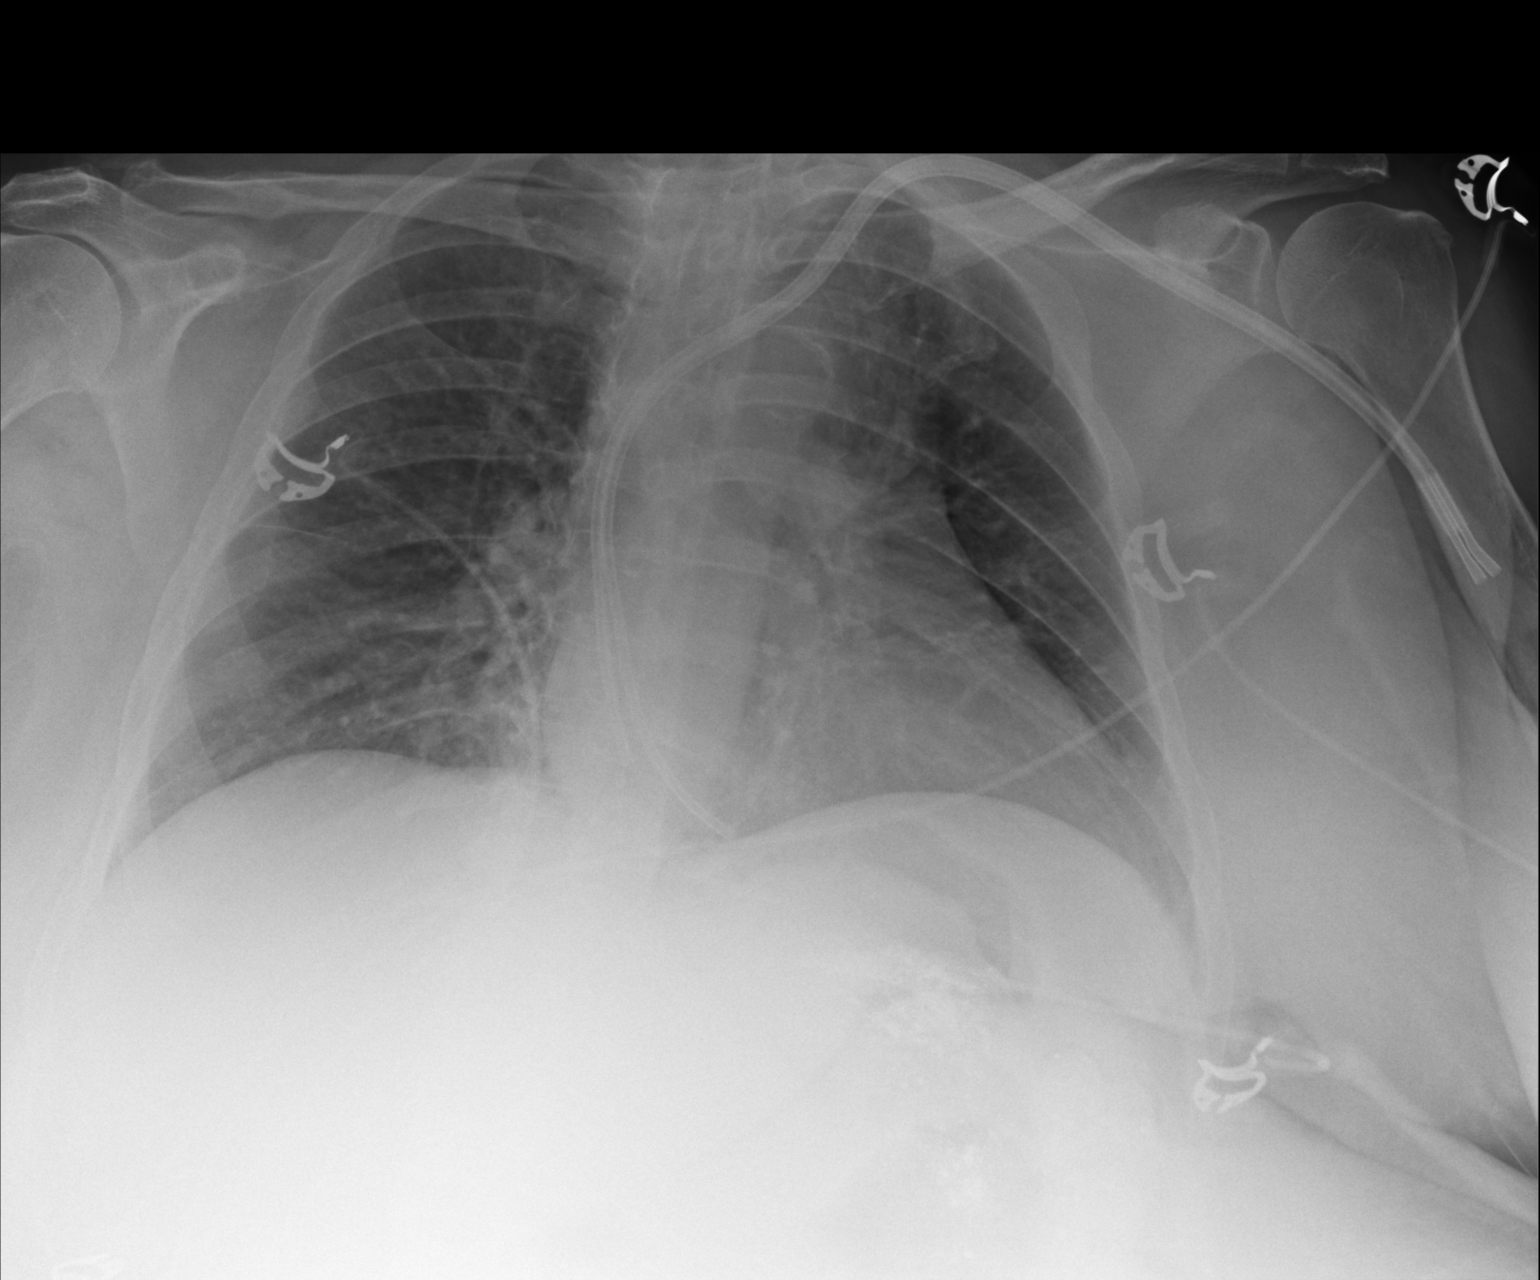

[1 of 1 positions shown; findings below may reference images not displayed]

FINDINGS: Stable appearance of left-sided tunneled dialysis catheter. Minimal
atelectasis present at the right lung base. There is no evidence of
pulmonary edema, consolidation, pneumothorax, nodule or pleural
fluid. The heart size is stable and normal.
IMPRESSION: Mild right basilar atelectasis.  No active disease.

## 2015-04-28 IMAGING — CR DG CHEST 1V PORT
1 series · 1 of 1 positions shown · non-contrast
Comparison: 06/27/2014

CLINICAL DATA: Multiple attempts to insert the central line.

EXAM:
PORTABLE CHEST - 1 VIEW

[AP]
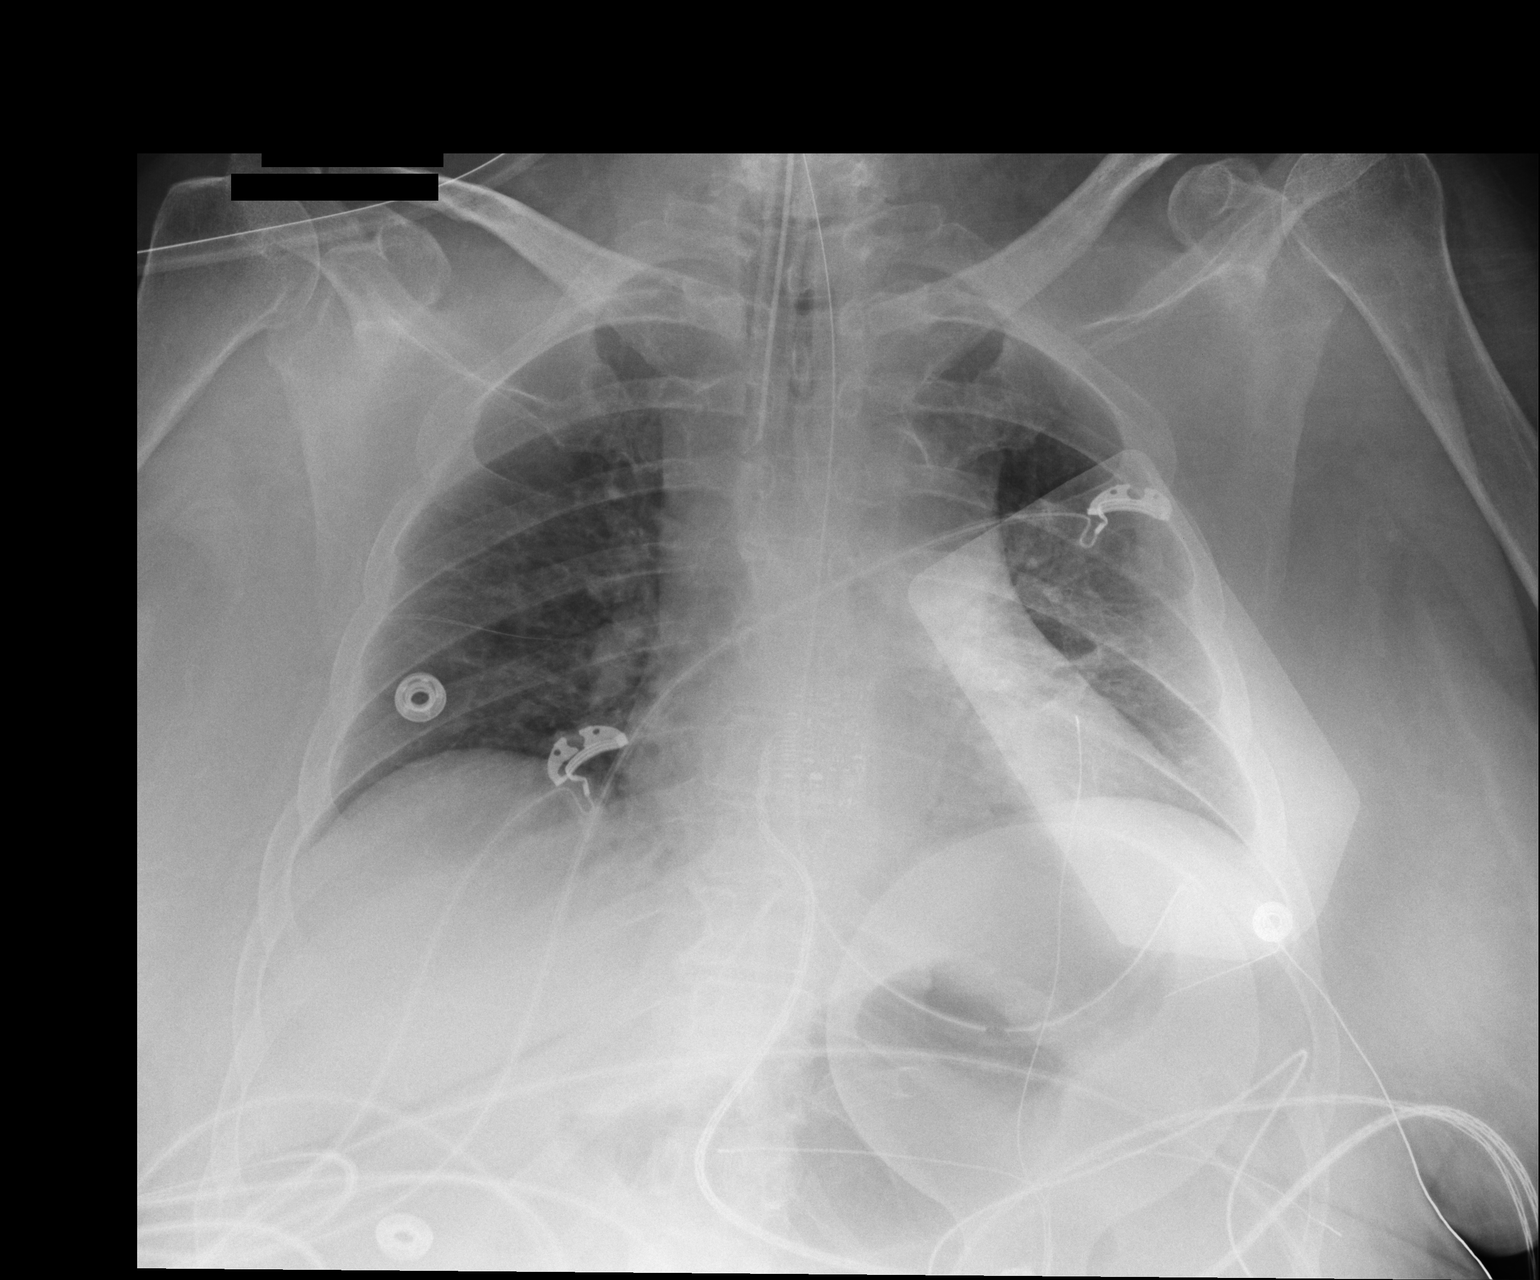

[1 of 1 positions shown; findings below may reference images not displayed]

FINDINGS: Endotracheal tube tip is 2 cm above the carina. The nasogastric tube
extends into the stomach. There is no pneumothorax. Mild
ground-glass opacities in the left base are unchanged. The right
lung is clear.
IMPRESSION: Negative for pneumothorax

## 2015-04-29 IMAGING — CR DG CHEST 1V PORT
1 series · 1 of 1 positions shown · non-contrast
Comparison: 06/27/2014

CLINICAL DATA: Dyspnea, respiratory failure.

EXAM:
PORTABLE CHEST - 1 VIEW

[AP]
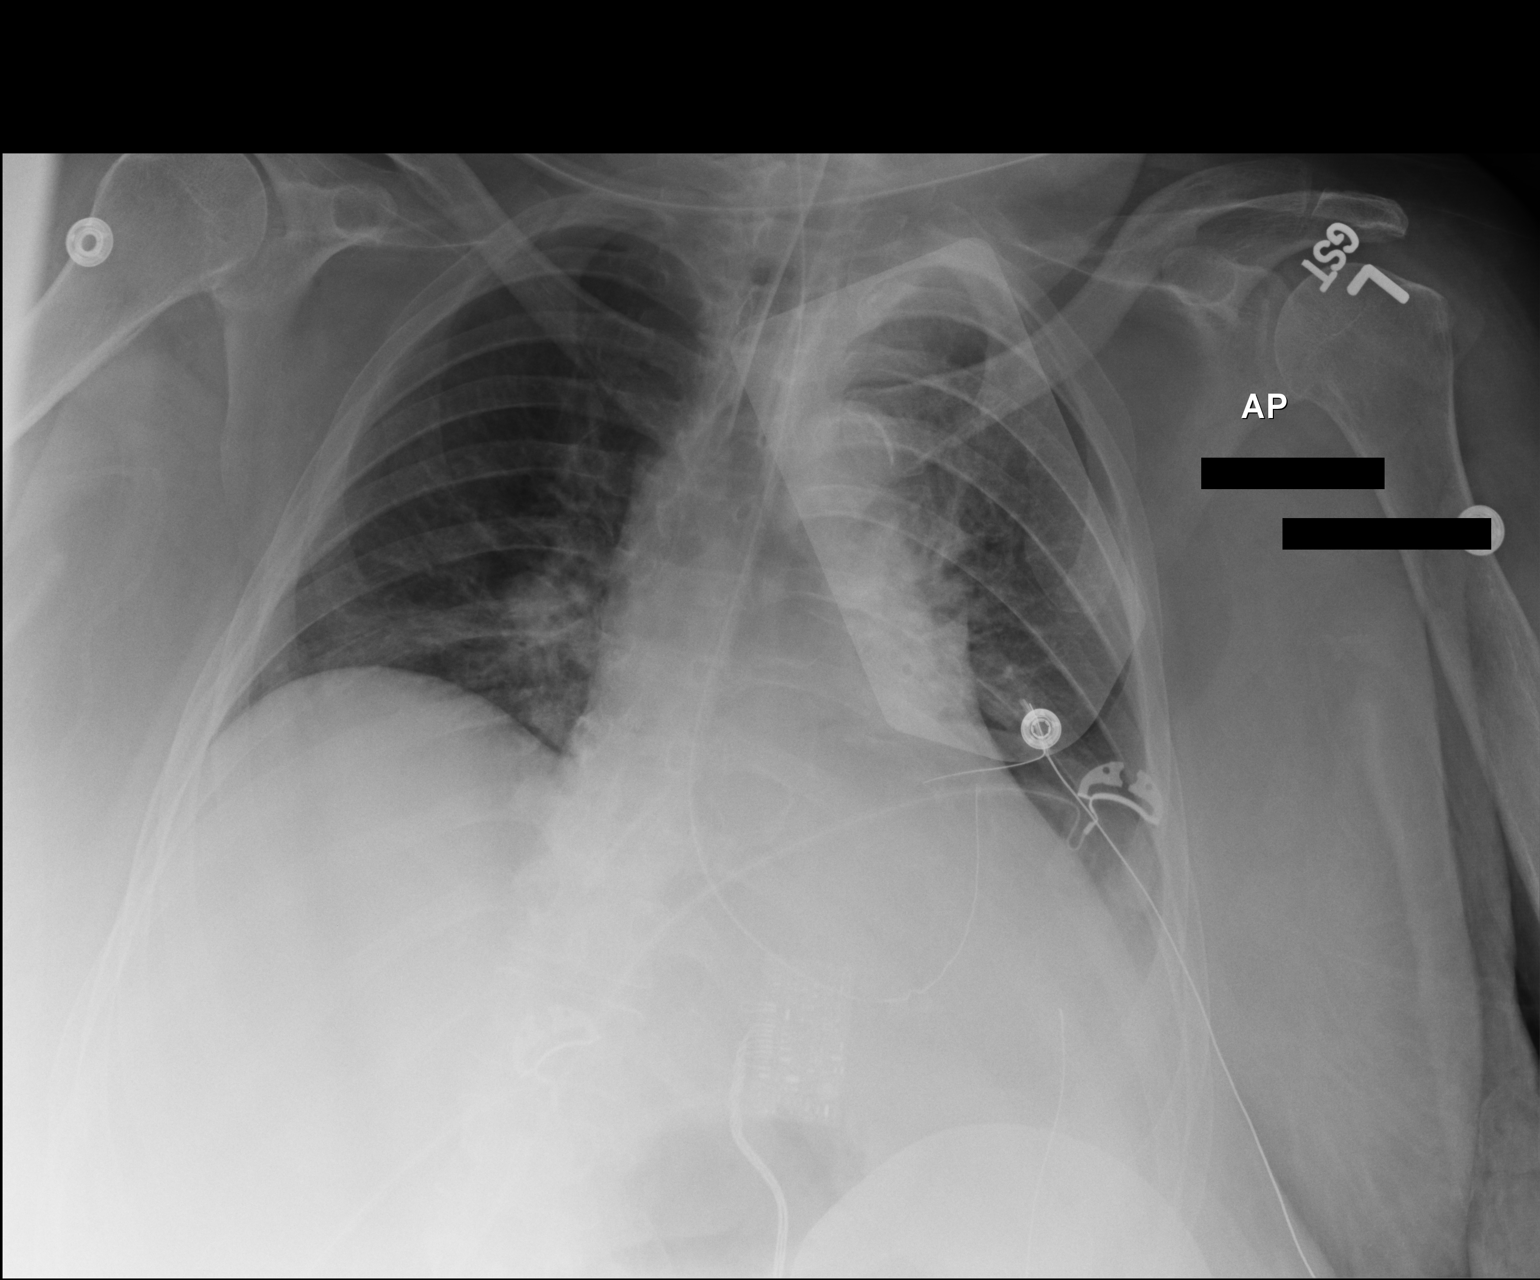

[1 of 1 positions shown; findings below may reference images not displayed]

FINDINGS: Endotracheal tube tip is 2.5 cm above the carina. Nasogastric tube
extends into the stomach. There is consolidation and effusion in the
left base. The right lung is clear except for minimal linear basilar
opacities. The left base consolidation and effusion appear worsened.
IMPRESSION: Support equipment appears well-positioned. Worsening opacity and
effusion in the left base.
